# Patient Record
Sex: Female | Born: 1935 | Race: White | Hispanic: No | Smoking: Former smoker
Health system: Southern US, Community
[De-identification: ages and names within clinical notes are randomized; demographics above are authoritative.]

## PROBLEM LIST (undated history)

## (undated) DIAGNOSIS — F419 Anxiety disorder, unspecified: Secondary | ICD-10-CM

## (undated) DIAGNOSIS — E785 Hyperlipidemia, unspecified: Secondary | ICD-10-CM

## (undated) DIAGNOSIS — K922 Gastrointestinal hemorrhage, unspecified: Secondary | ICD-10-CM

## (undated) DIAGNOSIS — D473 Essential (hemorrhagic) thrombocythemia: Secondary | ICD-10-CM

## (undated) DIAGNOSIS — E872 Acidosis, unspecified: Secondary | ICD-10-CM

## (undated) DIAGNOSIS — I129 Hypertensive chronic kidney disease with stage 1 through stage 4 chronic kidney disease, or unspecified chronic kidney disease: Secondary | ICD-10-CM

## (undated) DIAGNOSIS — D369 Benign neoplasm, unspecified site: Secondary | ICD-10-CM

## (undated) DIAGNOSIS — E039 Hypothyroidism, unspecified: Secondary | ICD-10-CM

## (undated) DIAGNOSIS — J189 Pneumonia, unspecified organism: Secondary | ICD-10-CM

## (undated) DIAGNOSIS — F32A Depression, unspecified: Secondary | ICD-10-CM

## (undated) DIAGNOSIS — I739 Peripheral vascular disease, unspecified: Secondary | ICD-10-CM

## (undated) DIAGNOSIS — E1165 Type 2 diabetes mellitus with hyperglycemia: Secondary | ICD-10-CM

## (undated) DIAGNOSIS — F329 Major depressive disorder, single episode, unspecified: Secondary | ICD-10-CM

## (undated) DIAGNOSIS — Z8711 Personal history of peptic ulcer disease: Secondary | ICD-10-CM

## (undated) DIAGNOSIS — I4891 Unspecified atrial fibrillation: Secondary | ICD-10-CM

## (undated) DIAGNOSIS — I10 Essential (primary) hypertension: Secondary | ICD-10-CM

## (undated) DIAGNOSIS — K559 Vascular disorder of intestine, unspecified: Secondary | ICD-10-CM

## (undated) DIAGNOSIS — E1142 Type 2 diabetes mellitus with diabetic polyneuropathy: Secondary | ICD-10-CM

## (undated) DIAGNOSIS — J9691 Respiratory failure, unspecified with hypoxia: Secondary | ICD-10-CM

## (undated) DIAGNOSIS — D649 Anemia, unspecified: Secondary | ICD-10-CM

## (undated) DIAGNOSIS — R0902 Hypoxemia: Secondary | ICD-10-CM

## (undated) DIAGNOSIS — R609 Edema, unspecified: Secondary | ICD-10-CM

## (undated) DIAGNOSIS — I700 Atherosclerosis of aorta: Secondary | ICD-10-CM

## (undated) DIAGNOSIS — E8729 Other acidosis: Secondary | ICD-10-CM

## (undated) DIAGNOSIS — K5792 Diverticulitis of intestine, part unspecified, without perforation or abscess without bleeding: Secondary | ICD-10-CM

## (undated) DIAGNOSIS — M810 Age-related osteoporosis without current pathological fracture: Secondary | ICD-10-CM

## (undated) DIAGNOSIS — I509 Heart failure, unspecified: Secondary | ICD-10-CM

## (undated) DIAGNOSIS — J9692 Respiratory failure, unspecified with hypercapnia: Secondary | ICD-10-CM

## (undated) DIAGNOSIS — F1921 Other psychoactive substance dependence, in remission: Secondary | ICD-10-CM

## (undated) DIAGNOSIS — I480 Paroxysmal atrial fibrillation: Secondary | ICD-10-CM

## (undated) DIAGNOSIS — K2270 Barrett's esophagus without dysplasia: Secondary | ICD-10-CM

## (undated) DIAGNOSIS — R0989 Other specified symptoms and signs involving the circulatory and respiratory systems: Secondary | ICD-10-CM

## (undated) DIAGNOSIS — J449 Chronic obstructive pulmonary disease, unspecified: Secondary | ICD-10-CM

## (undated) DIAGNOSIS — K5790 Diverticulosis of intestine, part unspecified, without perforation or abscess without bleeding: Secondary | ICD-10-CM

## (undated) DIAGNOSIS — IMO0002 Reserved for concepts with insufficient information to code with codable children: Secondary | ICD-10-CM

## (undated) DIAGNOSIS — E1122 Type 2 diabetes mellitus with diabetic chronic kidney disease: Secondary | ICD-10-CM

## (undated) DIAGNOSIS — I340 Nonrheumatic mitral (valve) insufficiency: Secondary | ICD-10-CM

## (undated) DIAGNOSIS — F0390 Unspecified dementia without behavioral disturbance: Secondary | ICD-10-CM

## (undated) DIAGNOSIS — I447 Left bundle-branch block, unspecified: Secondary | ICD-10-CM

## (undated) DIAGNOSIS — J811 Chronic pulmonary edema: Secondary | ICD-10-CM

## (undated) DIAGNOSIS — D509 Iron deficiency anemia, unspecified: Secondary | ICD-10-CM

## (undated) DIAGNOSIS — F0150 Vascular dementia without behavioral disturbance: Secondary | ICD-10-CM

## (undated) DIAGNOSIS — M1990 Unspecified osteoarthritis, unspecified site: Secondary | ICD-10-CM

## (undated) DIAGNOSIS — K529 Noninfective gastroenteritis and colitis, unspecified: Secondary | ICD-10-CM

## (undated) HISTORY — PX: COLONOSCOPY: SHX174

## (undated) HISTORY — PX: SIGMOIDOSCOPY: SUR1295

## (undated) HISTORY — DX: Peripheral vascular disease, unspecified: I73.9

## (undated) HISTORY — DX: Noninfective gastroenteritis and colitis, unspecified: K52.9

## (undated) HISTORY — DX: Depression, unspecified: F32.A

## (undated) HISTORY — DX: Diverticulitis of intestine, part unspecified, without perforation or abscess without bleeding: K57.92

## (undated) HISTORY — PX: ABDOMINAL HYSTERECTOMY: SHX81

## (undated) HISTORY — DX: Hypothyroidism, unspecified: E03.9

## (undated) HISTORY — PX: SHOULDER ARTHROSCOPY: SHX128

## (undated) HISTORY — DX: Hyperlipidemia, unspecified: E78.5

## (undated) HISTORY — DX: Major depressive disorder, single episode, unspecified: F32.9

## (undated) HISTORY — DX: Anemia, unspecified: D64.9

## (undated) HISTORY — PX: ESOPHAGOGASTRODUODENOSCOPY: SHX1529

## (undated) HISTORY — PX: APPENDECTOMY: SHX54

## (undated) HISTORY — DX: Essential (primary) hypertension: I10

## (undated) SURGERY — Surgical Case
Anesthesia: *Unknown

---

## 2004-04-19 ENCOUNTER — Ambulatory Visit: Payer: Self-pay | Admitting: Internal Medicine

## 2004-05-20 ENCOUNTER — Ambulatory Visit: Payer: Self-pay | Admitting: Internal Medicine

## 2004-10-02 ENCOUNTER — Inpatient Hospital Stay: Payer: Self-pay | Admitting: Internal Medicine

## 2005-10-28 ENCOUNTER — Ambulatory Visit: Payer: Self-pay | Admitting: Unknown Physician Specialty

## 2006-03-03 ENCOUNTER — Ambulatory Visit: Payer: Self-pay | Admitting: Unknown Physician Specialty

## 2007-12-14 ENCOUNTER — Ambulatory Visit: Payer: Self-pay | Admitting: Internal Medicine

## 2008-04-16 ENCOUNTER — Ambulatory Visit: Payer: Self-pay | Admitting: Unknown Physician Specialty

## 2009-05-13 ENCOUNTER — Emergency Department: Payer: Self-pay | Admitting: Emergency Medicine

## 2011-03-22 ENCOUNTER — Inpatient Hospital Stay: Payer: Self-pay | Admitting: Internal Medicine

## 2011-03-22 DIAGNOSIS — I059 Rheumatic mitral valve disease, unspecified: Secondary | ICD-10-CM

## 2011-08-24 ENCOUNTER — Ambulatory Visit: Payer: Self-pay | Admitting: Internal Medicine

## 2011-08-24 LAB — CREATININE, SERUM
Creatinine: 0.69 mg/dL (ref 0.60–1.30)
EGFR (African American): >60
EGFR (Non-African Amer.): >60

## 2011-10-21 ENCOUNTER — Inpatient Hospital Stay: Payer: Self-pay | Admitting: Internal Medicine

## 2011-10-21 LAB — URINALYSIS, COMPLETE
Bacteria: NONE SEEN
Bilirubin,UR: NEGATIVE
Nitrite: NEGATIVE
Protein: NEGATIVE
Specific Gravity: 1.032 (ref 1.003–1.030)
WBC UR: 9 /HPF (ref 0–5)

## 2011-10-21 LAB — COMPREHENSIVE METABOLIC PANEL
Albumin: 4.1 g/dL (ref 3.4–5.0)
Alkaline Phosphatase: 76 U/L (ref 50–136)
Anion Gap: 13 (ref 7–16)
BUN: 15 mg/dL (ref 7–18)
Bilirubin,Total: 0.4 mg/dL (ref 0.2–1.0)
Calcium, Total: 8.6 mg/dL (ref 8.5–10.1)
EGFR (African American): >60
EGFR (Non-African Amer.): >60
Osmolality: 272 (ref 275–301)
Potassium: 4.1 mmol/L (ref 3.5–5.1)
SGPT (ALT): 35 U/L
Total Protein: 7.7 g/dL (ref 6.4–8.2)

## 2011-10-21 LAB — CBC
HCT: 37.8 % (ref 35.0–47.0)
HGB: 12.1 g/dL (ref 12.0–16.0)
MCV: 81 fL (ref 80–100)
Platelet: 384 10*3/uL (ref 150–440)
RDW: 17.4 % — ABNORMAL HIGH (ref 11.5–14.5)

## 2011-10-22 LAB — COMPREHENSIVE METABOLIC PANEL
Albumin: 3.2 g/dL — ABNORMAL LOW (ref 3.4–5.0)
Alkaline Phosphatase: 59 U/L (ref 50–136)
Anion Gap: 9 (ref 7–16)
BUN: 8 mg/dL (ref 7–18)
Bilirubin,Total: 0.3 mg/dL (ref 0.2–1.0)
Calcium, Total: 8.3 mg/dL — ABNORMAL LOW (ref 8.5–10.1)
Co2: 29 mmol/L (ref 21–32)
Creatinine: 0.72 mg/dL (ref 0.60–1.30)
EGFR (African American): >60
EGFR (Non-African Amer.): >60
Glucose: 98 mg/dL (ref 65–99)
Osmolality: 283 (ref 275–301)
Potassium: 3.8 mmol/L (ref 3.5–5.1)
SGPT (ALT): 27 U/L
Sodium: 143 mmol/L (ref 136–145)
Total Protein: 6.5 g/dL (ref 6.4–8.2)

## 2011-10-22 LAB — CBC WITH DIFFERENTIAL/PLATELET
Basophil #: 0 10*3/uL (ref 0.0–0.1)
HCT: 33.5 % — ABNORMAL LOW (ref 35.0–47.0)
Lymphocyte #: 2.3 10*3/uL (ref 1.0–3.6)
MCHC: 32.2 g/dL (ref 32.0–36.0)
MCV: 80 fL (ref 80–100)
Monocyte #: 1.5 10*3/uL — ABNORMAL HIGH (ref 0.0–0.7)
Monocyte %: 14.4 %
Neutrophil #: 6.3 10*3/uL (ref 1.4–6.5)
Neutrophil %: 61.8 %
RBC: 4.19 10*6/uL (ref 3.80–5.20)
WBC: 10.2 10*3/uL (ref 3.6–11.0)

## 2011-10-22 LAB — PROTIME-INR: Prothrombin Time: 13.2 secs (ref 11.5–14.7)

## 2011-10-22 LAB — HEMOGLOBIN: HGB: 11.2 g/dL — ABNORMAL LOW (ref 12.0–16.0)

## 2011-10-23 LAB — BASIC METABOLIC PANEL
Anion Gap: 9 (ref 7–16)
Calcium, Total: 8.1 mg/dL — ABNORMAL LOW (ref 8.5–10.1)
Creatinine: 0.6 mg/dL (ref 0.60–1.30)
EGFR (African American): >60
Osmolality: 284 (ref 275–301)
Potassium: 3.7 mmol/L (ref 3.5–5.1)
Sodium: 144 mmol/L (ref 136–145)

## 2011-10-23 LAB — CBC WITH DIFFERENTIAL/PLATELET
Basophil #: 0 10*3/uL (ref 0.0–0.1)
Eosinophil #: 0.1 10*3/uL (ref 0.0–0.7)
HCT: 32.5 % — ABNORMAL LOW (ref 35.0–47.0)
MCH: 25.6 pg — ABNORMAL LOW (ref 26.0–34.0)
Monocyte #: 1.2 10*3/uL — ABNORMAL HIGH (ref 0.0–0.7)
Monocyte %: 15.4 %
Neutrophil #: 4.3 10*3/uL (ref 1.4–6.5)
RBC: 4.04 10*6/uL (ref 3.80–5.20)
RDW: 17.5 % — ABNORMAL HIGH (ref 11.5–14.5)

## 2011-10-24 LAB — BASIC METABOLIC PANEL
Calcium, Total: 8.1 mg/dL — ABNORMAL LOW (ref 8.5–10.1)
EGFR (African American): >60
EGFR (Non-African Amer.): >60
Glucose: 98 mg/dL (ref 65–99)
Osmolality: 281 (ref 275–301)
Potassium: 3.5 mmol/L (ref 3.5–5.1)
Sodium: 143 mmol/L (ref 136–145)

## 2011-10-25 LAB — HEMOGLOBIN: HGB: 10.4 g/dL — ABNORMAL LOW (ref 12.0–16.0)

## 2012-08-29 ENCOUNTER — Inpatient Hospital Stay: Payer: Self-pay | Admitting: Specialist

## 2012-08-29 LAB — URINALYSIS, COMPLETE
Bilirubin,UR: NEGATIVE
Glucose,UR: NEGATIVE mg/dL (ref 0–75)
Ketone: NEGATIVE
Nitrite: NEGATIVE
Ph: 7 (ref 4.5–8.0)
Protein: NEGATIVE
Specific Gravity: 1.005 (ref 1.003–1.030)
WBC UR: 3 /HPF (ref 0–5)

## 2012-08-29 LAB — COMPREHENSIVE METABOLIC PANEL
Alkaline Phosphatase: 85 U/L (ref 50–136)
Anion Gap: 6 — ABNORMAL LOW (ref 7–16)
Bilirubin,Total: 0.3 mg/dL (ref 0.2–1.0)
Calcium, Total: 8.2 mg/dL — ABNORMAL LOW (ref 8.5–10.1)
Chloride: 105 mmol/L (ref 98–107)
Co2: 27 mmol/L (ref 21–32)
Creatinine: 0.57 mg/dL — ABNORMAL LOW (ref 0.60–1.30)
EGFR (Non-African Amer.): >60
Glucose: 96 mg/dL (ref 65–99)
Potassium: 4 mmol/L (ref 3.5–5.1)
SGOT(AST): 34 U/L (ref 15–37)
SGPT (ALT): 27 U/L (ref 12–78)
Sodium: 138 mmol/L (ref 136–145)

## 2012-08-29 LAB — IRON AND TIBC
Iron Bind.Cap.(Total): 537 ug/dL — ABNORMAL HIGH (ref 250–450)
Unbound Iron-Bind.Cap.: 518 ug/dL

## 2012-08-29 LAB — CBC
HGB: 5.4 g/dL — ABNORMAL LOW (ref 12.0–16.0)
MCH: 17.3 pg — ABNORMAL LOW (ref 26.0–34.0)
MCHC: 28.3 g/dL — ABNORMAL LOW (ref 32.0–36.0)
MCV: 61 fL — ABNORMAL LOW (ref 80–100)
RBC: 3.11 10*6/uL — ABNORMAL LOW (ref 3.80–5.20)
RDW: 20 % — ABNORMAL HIGH (ref 11.5–14.5)

## 2012-08-29 LAB — TROPONIN I: Troponin-I: <0.02 ng/mL

## 2012-08-29 LAB — HEMOGLOBIN: HGB: 7.9 g/dL — ABNORMAL LOW (ref 12.0–16.0)

## 2012-08-30 LAB — BASIC METABOLIC PANEL
Anion Gap: 8 (ref 7–16)
Chloride: 103 mmol/L (ref 98–107)
Co2: 26 mmol/L (ref 21–32)
EGFR (African American): >60
EGFR (Non-African Amer.): >60
Glucose: 164 mg/dL — ABNORMAL HIGH (ref 65–99)
Osmolality: 278 (ref 275–301)
Potassium: 3.5 mmol/L (ref 3.5–5.1)

## 2012-08-30 LAB — CBC WITH DIFFERENTIAL/PLATELET
Comment - H1-Com4: NORMAL
HCT: 24.5 % — ABNORMAL LOW (ref 35.0–47.0)
HGB: 7.3 g/dL — ABNORMAL LOW (ref 12.0–16.0)
MCH: 19.4 pg — ABNORMAL LOW (ref 26.0–34.0)
MCHC: 29.9 g/dL — ABNORMAL LOW (ref 32.0–36.0)
Monocytes: 1 %
Platelet: 446 10*3/uL — ABNORMAL HIGH (ref 150–440)
RBC: 3.77 10*6/uL — ABNORMAL LOW (ref 3.80–5.20)
RDW: 24.9 % — ABNORMAL HIGH (ref 11.5–14.5)
Segmented Neutrophils: 95 %
WBC: 13.4 10*3/uL — ABNORMAL HIGH (ref 3.6–11.0)

## 2012-08-30 LAB — HEMOGLOBIN: HGB: 7.8 g/dL — ABNORMAL LOW (ref 12.0–16.0)

## 2012-08-31 DIAGNOSIS — I517 Cardiomegaly: Secondary | ICD-10-CM

## 2012-08-31 LAB — BASIC METABOLIC PANEL
Anion Gap: 10 (ref 7–16)
BUN: 13 mg/dL (ref 7–18)
Calcium, Total: 7.7 mg/dL — ABNORMAL LOW (ref 8.5–10.1)
Chloride: 105 mmol/L (ref 98–107)
Co2: 24 mmol/L (ref 21–32)
Creatinine: 0.68 mg/dL (ref 0.60–1.30)
EGFR (African American): >60
EGFR (Non-African Amer.): >60
Glucose: 136 mg/dL — ABNORMAL HIGH (ref 65–99)
Osmolality: 280 (ref 275–301)
Potassium: 3.5 mmol/L (ref 3.5–5.1)
Sodium: 139 mmol/L (ref 136–145)

## 2012-08-31 LAB — CBC WITH DIFFERENTIAL/PLATELET
Basophil: 1 %
Comment - H1-Com6: NORMAL
HCT: 23.4 % — ABNORMAL LOW (ref 35.0–47.0)
HGB: 6.9 g/dL — ABNORMAL LOW (ref 12.0–16.0)
Lymphocytes: 6 %
MCH: 19 pg — ABNORMAL LOW (ref 26.0–34.0)
MCHC: 29.4 g/dL — ABNORMAL LOW (ref 32.0–36.0)
Monocytes: 6 %
RBC: 3.62 10*6/uL — ABNORMAL LOW (ref 3.80–5.20)
RDW: 25.2 % — ABNORMAL HIGH (ref 11.5–14.5)
Segmented Neutrophils: 87 %
WBC: 14.4 10*3/uL — ABNORMAL HIGH (ref 3.6–11.0)

## 2012-09-01 LAB — HEMOGLOBIN: HGB: 8.5 g/dL — ABNORMAL LOW (ref 12.0–16.0)

## 2012-09-01 LAB — HEMATOCRIT: HCT: 28.9 % — ABNORMAL LOW (ref 35.0–47.0)

## 2012-09-02 LAB — CBC WITH DIFFERENTIAL/PLATELET
Basophil #: 0.1 10*3/uL (ref 0.0–0.1)
Basophil %: 0.5 %
Eosinophil #: 0.1 10*3/uL (ref 0.0–0.7)
HGB: 8.4 g/dL — ABNORMAL LOW (ref 12.0–16.0)
Lymphocyte %: 7.4 %
MCH: 19.7 pg — ABNORMAL LOW (ref 26.0–34.0)
MCHC: 28.9 g/dL — ABNORMAL LOW (ref 32.0–36.0)
MCV: 68 fL — ABNORMAL LOW (ref 80–100)
Monocyte %: 10.9 %
Neutrophil #: 12.6 10*3/uL — ABNORMAL HIGH (ref 1.4–6.5)
Platelet: 410 10*3/uL (ref 150–440)
RBC: 4.24 10*6/uL (ref 3.80–5.20)
WBC: 15.7 10*3/uL — ABNORMAL HIGH (ref 3.6–11.0)

## 2012-09-03 LAB — CBC WITH DIFFERENTIAL/PLATELET
Basophil #: 0.1 10*3/uL (ref 0.0–0.1)
Basophil %: 0.7 %
Eosinophil #: 0.2 10*3/uL (ref 0.0–0.7)
HGB: 8.2 g/dL — ABNORMAL LOW (ref 12.0–16.0)
Lymphocyte #: 2 10*3/uL (ref 1.0–3.6)
Lymphocyte %: 19.2 %
MCH: 19.2 pg — ABNORMAL LOW (ref 26.0–34.0)
MCHC: 28.2 g/dL — ABNORMAL LOW (ref 32.0–36.0)
MCV: 68 fL — ABNORMAL LOW (ref 80–100)
Monocyte #: 1.5 x10 3/mm — ABNORMAL HIGH (ref 0.2–0.9)
Monocyte %: 14.2 %
Platelet: 416 10*3/uL (ref 150–440)
RBC: 4.25 10*6/uL (ref 3.80–5.20)
RDW: 28.6 % — ABNORMAL HIGH (ref 11.5–14.5)
WBC: 10.5 10*3/uL (ref 3.6–11.0)

## 2012-09-04 LAB — CULTURE, BLOOD (SINGLE)

## 2012-09-05 ENCOUNTER — Encounter: Payer: Self-pay | Admitting: Internal Medicine

## 2012-09-05 LAB — HEMOGLOBIN: HGB: 8.2 g/dL — ABNORMAL LOW (ref 12.0–16.0)

## 2012-09-13 LAB — CBC WITH DIFFERENTIAL/PLATELET
Basophil %: 1.2 %
Eosinophil #: 0.1 10*3/uL (ref 0.0–0.7)
Eosinophil %: 1.4 %
HCT: 28.1 % — ABNORMAL LOW (ref 35.0–47.0)
HGB: 8.2 g/dL — ABNORMAL LOW (ref 12.0–16.0)
Lymphocyte #: 1.4 10*3/uL (ref 1.0–3.6)
MCH: 20 pg — ABNORMAL LOW (ref 26.0–34.0)
Monocyte #: 0.7 x10 3/mm (ref 0.2–0.9)
Monocyte %: 13.5 %
Neutrophil #: 3.1 10*3/uL (ref 1.4–6.5)
Platelet: 644 10*3/uL — ABNORMAL HIGH (ref 150–440)
RBC: 4.12 10*6/uL (ref 3.80–5.20)
RDW: 27.7 % — ABNORMAL HIGH (ref 11.5–14.5)
WBC: 5.4 10*3/uL (ref 3.6–11.0)

## 2012-09-17 ENCOUNTER — Encounter: Payer: Self-pay | Admitting: Internal Medicine

## 2012-10-03 ENCOUNTER — Ambulatory Visit: Payer: Self-pay | Admitting: Unknown Physician Specialty

## 2012-11-17 ENCOUNTER — Ambulatory Visit: Payer: Self-pay | Admitting: Oncology

## 2012-11-29 ENCOUNTER — Inpatient Hospital Stay: Payer: Self-pay | Admitting: Internal Medicine

## 2012-11-29 LAB — CBC
HGB: 6.7 g/dL — ABNORMAL LOW (ref 12.0–16.0)
MCHC: 28 g/dL — ABNORMAL LOW (ref 32.0–36.0)
MCV: 64 fL — ABNORMAL LOW (ref 80–100)
RBC: 3.73 10*6/uL — ABNORMAL LOW (ref 3.80–5.20)
RDW: 20.5 % — ABNORMAL HIGH (ref 11.5–14.5)
WBC: 27.5 10*3/uL — ABNORMAL HIGH (ref 3.6–11.0)

## 2012-11-29 LAB — COMPREHENSIVE METABOLIC PANEL
Albumin: 3.3 g/dL — ABNORMAL LOW (ref 3.4–5.0)
Anion Gap: 8 (ref 7–16)
BUN: 8 mg/dL (ref 7–18)
Bilirubin,Total: 0.4 mg/dL (ref 0.2–1.0)
Calcium, Total: 8 mg/dL — ABNORMAL LOW (ref 8.5–10.1)
Chloride: 98 mmol/L (ref 98–107)
Co2: 27 mmol/L (ref 21–32)
EGFR (African American): >60
EGFR (Non-African Amer.): >60
Glucose: 233 mg/dL — ABNORMAL HIGH (ref 65–99)
Osmolality: 272 (ref 275–301)
Potassium: 3.5 mmol/L (ref 3.5–5.1)
SGOT(AST): 25 U/L (ref 15–37)
SGPT (ALT): 28 U/L (ref 12–78)

## 2012-11-29 LAB — URINALYSIS, COMPLETE
Bacteria: NONE SEEN
Hyaline Cast: 11
Ketone: NEGATIVE
Nitrite: NEGATIVE
Ph: 6 (ref 4.5–8.0)

## 2012-11-29 LAB — CK TOTAL AND CKMB (NOT AT ARMC): CK-MB: 0.6 ng/mL (ref 0.5–3.6)

## 2012-11-29 LAB — PRO B NATRIURETIC PEPTIDE: B-Type Natriuretic Peptide: 1663 pg/mL — ABNORMAL HIGH (ref 0–450)

## 2012-11-30 LAB — BASIC METABOLIC PANEL
Anion Gap: 7 (ref 7–16)
BUN: 6 mg/dL — ABNORMAL LOW (ref 7–18)
Calcium, Total: 7.4 mg/dL — ABNORMAL LOW (ref 8.5–10.1)
Chloride: 103 mmol/L (ref 98–107)
Co2: 24 mmol/L (ref 21–32)
Creatinine: 0.65 mg/dL (ref 0.60–1.30)
Glucose: 190 mg/dL — ABNORMAL HIGH (ref 65–99)
Osmolality: 271 (ref 275–301)
Potassium: 2.9 mmol/L — ABNORMAL LOW (ref 3.5–5.1)
Sodium: 134 mmol/L — ABNORMAL LOW (ref 136–145)

## 2012-11-30 LAB — CBC WITH DIFFERENTIAL/PLATELET
Basophil #: 0 10*3/uL (ref 0.0–0.1)
Basophil %: 0 %
Eosinophil #: 0 10*3/uL (ref 0.0–0.7)
Eosinophil %: 0 %
HCT: 21.4 % — ABNORMAL LOW (ref 35.0–47.0)
HGB: 6.4 g/dL — ABNORMAL LOW (ref 12.0–16.0)
Lymphocyte #: 0.4 10*3/uL — ABNORMAL LOW (ref 1.0–3.6)
Lymphocyte %: 2.6 %
MCH: 19.6 pg — ABNORMAL LOW (ref 26.0–34.0)
MCHC: 29.9 g/dL — ABNORMAL LOW (ref 32.0–36.0)
MCV: 66 fL — ABNORMAL LOW (ref 80–100)
Monocyte #: 0.6 x10 3/mm (ref 0.2–0.9)
Monocyte %: 3.8 %
Neutrophil #: 15.6 10*3/uL — ABNORMAL HIGH (ref 1.4–6.5)
Neutrophil %: 93.6 %
Platelet: 304 10*3/uL (ref 150–440)
RBC: 3.26 10*6/uL — ABNORMAL LOW (ref 3.80–5.20)
RDW: 22.7 % — ABNORMAL HIGH (ref 11.5–14.5)
WBC: 16.7 10*3/uL — ABNORMAL HIGH (ref 3.6–11.0)

## 2012-12-01 LAB — BASIC METABOLIC PANEL
Anion Gap: 5 — ABNORMAL LOW (ref 7–16)
BUN: 11 mg/dL (ref 7–18)
Calcium, Total: 7.7 mg/dL — ABNORMAL LOW (ref 8.5–10.1)
Chloride: 104 mmol/L (ref 98–107)
Co2: 25 mmol/L (ref 21–32)
Creatinine: 0.5 mg/dL — ABNORMAL LOW (ref 0.60–1.30)
EGFR (African American): >60
EGFR (Non-African Amer.): >60
Glucose: 172 mg/dL — ABNORMAL HIGH (ref 65–99)
Osmolality: 272 (ref 275–301)

## 2012-12-01 LAB — CBC WITH DIFFERENTIAL/PLATELET
Eosinophil #: 0 10*3/uL (ref 0.0–0.7)
HCT: 24.1 % — ABNORMAL LOW (ref 35.0–47.0)
HGB: 7.2 g/dL — ABNORMAL LOW (ref 12.0–16.0)
Lymphocyte #: 0.4 10*3/uL — ABNORMAL LOW (ref 1.0–3.6)
MCH: 20.5 pg — ABNORMAL LOW (ref 26.0–34.0)
MCV: 68 fL — ABNORMAL LOW (ref 80–100)
Neutrophil #: 25.5 10*3/uL — ABNORMAL HIGH (ref 1.4–6.5)
WBC: 27.1 10*3/uL — ABNORMAL HIGH (ref 3.6–11.0)

## 2012-12-01 LAB — EXPECTORATED SPUTUM ASSESSMENT W REFEX TO RESP CULTURE

## 2012-12-01 LAB — MAGNESIUM: Magnesium: 2.2 mg/dL

## 2012-12-02 LAB — CBC WITH DIFFERENTIAL/PLATELET
Eosinophil #: 0 10*3/uL (ref 0.0–0.7)
Eosinophil %: 0 %
HGB: 8.6 g/dL — ABNORMAL LOW (ref 12.0–16.0)
Lymphocyte #: 0.4 10*3/uL — ABNORMAL LOW (ref 1.0–3.6)
Lymphocyte %: 1.4 %
MCH: 20.4 pg — ABNORMAL LOW (ref 26.0–34.0)
MCHC: 29.8 g/dL — ABNORMAL LOW (ref 32.0–36.0)
Monocyte #: 1.7 x10 3/mm — ABNORMAL HIGH (ref 0.2–0.9)
Monocyte %: 5.7 %
Neutrophil #: 27.4 10*3/uL — ABNORMAL HIGH (ref 1.4–6.5)
RBC: 4.23 10*6/uL (ref 3.80–5.20)
RDW: 24.9 % — ABNORMAL HIGH (ref 11.5–14.5)
WBC: 29.6 10*3/uL — ABNORMAL HIGH (ref 3.6–11.0)

## 2012-12-03 LAB — CBC WITH DIFFERENTIAL/PLATELET
Basophil #: 0 10*3/uL (ref 0.0–0.1)
Basophil %: 0.3 %
Eosinophil #: 0 10*3/uL (ref 0.0–0.7)
Eosinophil %: 0.1 %
HCT: 26.3 % — ABNORMAL LOW (ref 35.0–47.0)
HGB: 7.9 g/dL — ABNORMAL LOW (ref 12.0–16.0)
Lymphocyte #: 1.9 10*3/uL (ref 1.0–3.6)
Lymphocyte %: 10.5 %
MCH: 20.5 pg — ABNORMAL LOW (ref 26.0–34.0)
MCHC: 29.9 g/dL — ABNORMAL LOW (ref 32.0–36.0)
MCV: 68 fL — ABNORMAL LOW (ref 80–100)
Monocyte #: 1.9 x10 3/mm — ABNORMAL HIGH (ref 0.2–0.9)
Monocyte %: 10.5 %
Neutrophil #: 14.5 10*3/uL — ABNORMAL HIGH (ref 1.4–6.5)
Neutrophil %: 78.6 %
Platelet: 392 10*3/uL (ref 150–440)
RBC: 3.84 10*6/uL (ref 3.80–5.20)
RDW: 25.9 % — ABNORMAL HIGH (ref 11.5–14.5)
WBC: 18.4 10*3/uL — ABNORMAL HIGH (ref 3.6–11.0)

## 2012-12-05 LAB — CREATININE, SERUM
Creatinine: 0.39 mg/dL — ABNORMAL LOW (ref 0.60–1.30)
EGFR (African American): >60
EGFR (Non-African Amer.): >60

## 2012-12-05 LAB — CULTURE, BLOOD (SINGLE)

## 2012-12-18 ENCOUNTER — Ambulatory Visit: Payer: Self-pay | Admitting: Oncology

## 2014-01-22 ENCOUNTER — Inpatient Hospital Stay: Payer: Self-pay | Admitting: Student

## 2014-01-22 LAB — CBC WITH DIFFERENTIAL/PLATELET
BASOS ABS: 0.1 10*3/uL (ref 0.0–0.1)
Basophil %: 0.6 %
EOS ABS: 0.6 10*3/uL (ref 0.0–0.7)
Eosinophil %: 3.1 %
HCT: 23.7 % — ABNORMAL LOW (ref 35.0–47.0)
HGB: 6.5 g/dL — AB (ref 12.0–16.0)
Lymphocyte #: 9.5 10*3/uL — ABNORMAL HIGH (ref 1.0–3.6)
Lymphocyte %: 46.6 %
MCH: 20.1 pg — AB (ref 26.0–34.0)
MCHC: 27.4 g/dL — AB (ref 32.0–36.0)
MCV: 73 fL — ABNORMAL LOW (ref 80–100)
MONO ABS: 2.7 x10 3/mm — AB (ref 0.2–0.9)
MONOS PCT: 13.2 %
NEUTROS ABS: 7.4 10*3/uL — AB (ref 1.4–6.5)
NEUTROS PCT: 36.5 %
Platelet: 614 10*3/uL — ABNORMAL HIGH (ref 150–440)
RBC: 3.24 10*6/uL — ABNORMAL LOW (ref 3.80–5.20)
RDW: 18.8 % — AB (ref 11.5–14.5)
WBC: 20.3 10*3/uL — ABNORMAL HIGH (ref 3.6–11.0)

## 2014-01-22 LAB — COMPREHENSIVE METABOLIC PANEL
ALT: 41 U/L (ref 12–78)
Albumin: 2.9 g/dL — ABNORMAL LOW (ref 3.4–5.0)
Alkaline Phosphatase: 88 U/L
Anion Gap: 13 (ref 7–16)
BUN: 8 mg/dL (ref 7–18)
Bilirubin,Total: 0.2 mg/dL (ref 0.2–1.0)
CALCIUM: 7.7 mg/dL — AB (ref 8.5–10.1)
CO2: 22 mmol/L (ref 21–32)
Chloride: 98 mmol/L (ref 98–107)
Creatinine: 0.86 mg/dL (ref 0.60–1.30)
EGFR (Non-African Amer.): >60
Glucose: 332 mg/dL — ABNORMAL HIGH (ref 65–99)
Osmolality: 278 (ref 275–301)
Potassium: 4.1 mmol/L (ref 3.5–5.1)
SGOT(AST): 41 U/L — ABNORMAL HIGH (ref 15–37)
Sodium: 133 mmol/L — ABNORMAL LOW (ref 136–145)
Total Protein: 7.3 g/dL (ref 6.4–8.2)

## 2014-01-22 LAB — TROPONIN I
TROPONIN-I: 0.04 ng/mL
TROPONIN-I: 0.05 ng/mL

## 2014-01-22 LAB — URINALYSIS, COMPLETE
BACTERIA: NONE SEEN
BLOOD: NEGATIVE
Bilirubin,UR: NEGATIVE
Glucose,UR: >=500 mg/dL (ref 0–75)
Leukocyte Esterase: NEGATIVE
Nitrite: NEGATIVE
PH: 5 (ref 4.5–8.0)
PROTEIN: NEGATIVE
RBC,UR: 6 /HPF (ref 0–5)
Specific Gravity: 1.02 (ref 1.003–1.030)
WBC UR: 3 /HPF (ref 0–5)

## 2014-01-22 LAB — OCCULT BLOOD X 1 CARD TO LAB, STOOL: Occult Blood, Feces: POSITIVE

## 2014-01-22 LAB — HEMOGLOBIN
HGB: 6.8 g/dL — ABNORMAL LOW (ref 12.0–16.0)
HGB: 8.1 g/dL — AB (ref 12.0–16.0)

## 2014-01-22 LAB — CK-MB
CK-MB: 1.7 ng/mL (ref 0.5–3.6)
CK-MB: 2.1 ng/mL (ref 0.5–3.6)

## 2014-01-22 LAB — PRO B NATRIURETIC PEPTIDE: B-TYPE NATIURETIC PEPTID: 1481 pg/mL — AB (ref 0–450)

## 2014-01-22 LAB — MAGNESIUM: MAGNESIUM: 2 mg/dL

## 2014-01-22 LAB — PROTIME-INR
INR: 1.1
Prothrombin Time: 14 secs (ref 11.5–14.7)

## 2014-01-22 LAB — PHOSPHORUS: Phosphorus: 5.5 mg/dL — ABNORMAL HIGH (ref 2.5–4.9)

## 2014-01-22 LAB — CK: CK, Total: 62 U/L

## 2014-01-23 LAB — BASIC METABOLIC PANEL
Anion Gap: 9 (ref 7–16)
BUN: 11 mg/dL (ref 7–18)
CO2: 26 mmol/L (ref 21–32)
CREATININE: 0.74 mg/dL (ref 0.60–1.30)
Calcium, Total: 7.6 mg/dL — ABNORMAL LOW (ref 8.5–10.1)
Chloride: 99 mmol/L (ref 98–107)
EGFR (African American): >60
GLUCOSE: 233 mg/dL — AB (ref 65–99)
OSMOLALITY: 275 (ref 275–301)
Potassium: 3.7 mmol/L (ref 3.5–5.1)
Sodium: 134 mmol/L — ABNORMAL LOW (ref 136–145)

## 2014-01-23 LAB — CBC WITH DIFFERENTIAL/PLATELET
BASOS ABS: 0 10*3/uL (ref 0.0–0.1)
Basophil %: 0.2 %
EOS PCT: 0 %
Eosinophil #: 0 10*3/uL (ref 0.0–0.7)
HCT: 24.7 % — ABNORMAL LOW (ref 35.0–47.0)
HGB: 7.7 g/dL — AB (ref 12.0–16.0)
LYMPHS ABS: 1 10*3/uL (ref 1.0–3.6)
LYMPHS PCT: 7.2 %
MCH: 22.6 pg — ABNORMAL LOW (ref 26.0–34.0)
MCHC: 31.1 g/dL — AB (ref 32.0–36.0)
MCV: 73 fL — ABNORMAL LOW (ref 80–100)
Monocyte #: 1.5 x10 3/mm — ABNORMAL HIGH (ref 0.2–0.9)
Monocyte %: 10.4 %
NEUTROS PCT: 82.2 %
Neutrophil #: 11.9 10*3/uL — ABNORMAL HIGH (ref 1.4–6.5)
Platelet: 372 10*3/uL (ref 150–440)
RBC: 3.4 10*6/uL — ABNORMAL LOW (ref 3.80–5.20)
RDW: 19.6 % — ABNORMAL HIGH (ref 11.5–14.5)
WBC: 14.5 10*3/uL — AB (ref 3.6–11.0)

## 2014-01-23 LAB — URINE CULTURE

## 2014-01-23 LAB — HEMOGLOBIN: HGB: 8 g/dL — AB (ref 12.0–16.0)

## 2014-01-24 LAB — CBC WITH DIFFERENTIAL/PLATELET
BASOS ABS: 0 10*3/uL (ref 0.0–0.1)
Basophil %: 0.1 %
EOS PCT: 0 %
Eosinophil #: 0 10*3/uL (ref 0.0–0.7)
HCT: 26.2 % — AB (ref 35.0–47.0)
HGB: 7.8 g/dL — ABNORMAL LOW (ref 12.0–16.0)
Lymphocyte #: 0.8 10*3/uL — ABNORMAL LOW (ref 1.0–3.6)
Lymphocyte %: 5 %
MCH: 21.9 pg — ABNORMAL LOW (ref 26.0–34.0)
MCHC: 29.8 g/dL — ABNORMAL LOW (ref 32.0–36.0)
MCV: 74 fL — ABNORMAL LOW (ref 80–100)
MONO ABS: 1.6 x10 3/mm — AB (ref 0.2–0.9)
MONOS PCT: 9.3 %
NEUTROS ABS: 14.4 10*3/uL — AB (ref 1.4–6.5)
Neutrophil %: 85.6 %
Platelet: 386 10*3/uL (ref 150–440)
RBC: 3.55 10*6/uL — ABNORMAL LOW (ref 3.80–5.20)
RDW: 20.3 % — AB (ref 11.5–14.5)
WBC: 16.8 10*3/uL — AB (ref 3.6–11.0)

## 2014-01-25 LAB — BASIC METABOLIC PANEL
Anion Gap: 10 (ref 7–16)
BUN: 17 mg/dL (ref 7–18)
Calcium, Total: 8 mg/dL — ABNORMAL LOW (ref 8.5–10.1)
Chloride: 95 mmol/L — ABNORMAL LOW (ref 98–107)
Co2: 31 mmol/L (ref 21–32)
Creatinine: 0.72 mg/dL (ref 0.60–1.30)
EGFR (African American): >60
EGFR (Non-African Amer.): >60
Glucose: 243 mg/dL — ABNORMAL HIGH (ref 65–99)
Osmolality: 282 (ref 275–301)
Potassium: 4 mmol/L (ref 3.5–5.1)
Sodium: 136 mmol/L (ref 136–145)

## 2014-01-25 LAB — CBC WITH DIFFERENTIAL/PLATELET
BASOS ABS: 0 10*3/uL (ref 0.0–0.1)
Basophil %: 0.1 %
Eosinophil #: 0 10*3/uL (ref 0.0–0.7)
Eosinophil %: 0 %
HCT: 25.3 % — AB (ref 35.0–47.0)
HGB: 7.7 g/dL — AB (ref 12.0–16.0)
Lymphocyte #: 0.9 10*3/uL — ABNORMAL LOW (ref 1.0–3.6)
Lymphocyte %: 6.6 %
MCH: 22.4 pg — AB (ref 26.0–34.0)
MCHC: 30.6 g/dL — ABNORMAL LOW (ref 32.0–36.0)
MCV: 73 fL — AB (ref 80–100)
MONOS PCT: 9.6 %
Monocyte #: 1.3 x10 3/mm — ABNORMAL HIGH (ref 0.2–0.9)
Neutrophil #: 11.4 10*3/uL — ABNORMAL HIGH (ref 1.4–6.5)
Neutrophil %: 83.7 %
Platelet: 397 10*3/uL (ref 150–440)
RBC: 3.46 10*6/uL — AB (ref 3.80–5.20)
RDW: 20.4 % — AB (ref 11.5–14.5)
WBC: 13.6 10*3/uL — ABNORMAL HIGH (ref 3.6–11.0)

## 2014-01-25 LAB — VANCOMYCIN, TROUGH: VANCOMYCIN, TROUGH: 4 ug/mL — AB (ref 10–20)

## 2014-01-26 LAB — CBC WITH DIFFERENTIAL/PLATELET
Basophil #: 0 10*3/uL (ref 0.0–0.1)
Basophil %: 0.2 %
Eosinophil #: 0 10*3/uL (ref 0.0–0.7)
Eosinophil %: 0.1 %
HCT: 28.7 % — AB (ref 35.0–47.0)
HGB: 8.8 g/dL — ABNORMAL LOW (ref 12.0–16.0)
LYMPHS ABS: 0.7 10*3/uL — AB (ref 1.0–3.6)
Lymphocyte %: 6.7 %
MCH: 23.3 pg — ABNORMAL LOW (ref 26.0–34.0)
MCHC: 30.8 g/dL — AB (ref 32.0–36.0)
MCV: 76 fL — AB (ref 80–100)
MONO ABS: 0.9 x10 3/mm (ref 0.2–0.9)
Monocyte %: 8.8 %
Neutrophil #: 8.4 10*3/uL — ABNORMAL HIGH (ref 1.4–6.5)
Neutrophil %: 84.2 %
Platelet: 422 10*3/uL (ref 150–440)
RBC: 3.79 10*6/uL — AB (ref 3.80–5.20)
RDW: 21 % — ABNORMAL HIGH (ref 11.5–14.5)
WBC: 10 10*3/uL (ref 3.6–11.0)

## 2014-01-27 LAB — CULTURE, BLOOD (SINGLE)

## 2014-02-20 DIAGNOSIS — M1990 Unspecified osteoarthritis, unspecified site: Secondary | ICD-10-CM | POA: Insufficient documentation

## 2014-04-16 ENCOUNTER — Emergency Department: Payer: Self-pay | Admitting: Emergency Medicine

## 2014-04-16 LAB — COMPREHENSIVE METABOLIC PANEL
ALK PHOS: 79 U/L
Albumin: 2.8 g/dL — ABNORMAL LOW (ref 3.4–5.0)
Anion Gap: 8 (ref 7–16)
BUN: 12 mg/dL (ref 7–18)
Bilirubin,Total: 0.2 mg/dL (ref 0.2–1.0)
CHLORIDE: 97 mmol/L — AB (ref 98–107)
CO2: 30 mmol/L (ref 21–32)
Calcium, Total: 8.3 mg/dL — ABNORMAL LOW (ref 8.5–10.1)
Creatinine: 0.66 mg/dL (ref 0.60–1.30)
GLUCOSE: 145 mg/dL — AB (ref 65–99)
Osmolality: 272 (ref 275–301)
Potassium: 4.2 mmol/L (ref 3.5–5.1)
SGOT(AST): 41 U/L — ABNORMAL HIGH (ref 15–37)
SGPT (ALT): 33 U/L
Sodium: 135 mmol/L — ABNORMAL LOW (ref 136–145)
Total Protein: 7.4 g/dL (ref 6.4–8.2)

## 2014-04-16 LAB — CBC
HCT: 23.5 % — AB (ref 35.0–47.0)
HGB: 6.9 g/dL — AB (ref 12.0–16.0)
MCH: 19.2 pg — ABNORMAL LOW (ref 26.0–34.0)
MCHC: 29.3 g/dL — ABNORMAL LOW (ref 32.0–36.0)
MCV: 66 fL — ABNORMAL LOW (ref 80–100)
Platelet: 530 10*3/uL — ABNORMAL HIGH (ref 150–440)
RBC: 3.59 10*6/uL — AB (ref 3.80–5.20)
RDW: 20.6 % — ABNORMAL HIGH (ref 11.5–14.5)
WBC: 7.4 10*3/uL (ref 3.6–11.0)

## 2014-05-01 ENCOUNTER — Emergency Department: Payer: Self-pay | Admitting: Emergency Medicine

## 2014-05-01 ENCOUNTER — Ambulatory Visit: Payer: Self-pay | Admitting: Oncology

## 2014-05-01 LAB — CBC CANCER CENTER
Basophil #: 0 x10 3/mm (ref 0.0–0.1)
Basophil %: 0.7 %
Eosinophil #: 0.2 x10 3/mm (ref 0.0–0.7)
Eosinophil %: 3.1 %
HCT: 23.6 % — ABNORMAL LOW (ref 35.0–47.0)
HGB: 6.8 g/dL — ABNORMAL LOW (ref 12.0–16.0)
LYMPHS ABS: 2 x10 3/mm (ref 1.0–3.6)
Lymphocyte %: 27 %
MCH: 18.4 pg — AB (ref 26.0–34.0)
MCHC: 28.6 g/dL — ABNORMAL LOW (ref 32.0–36.0)
MCV: 64 fL — AB (ref 80–100)
MONO ABS: 1.3 x10 3/mm — AB (ref 0.2–0.9)
MONOS PCT: 18.4 %
Neutrophil #: 3.7 x10 3/mm (ref 1.4–6.5)
Neutrophil %: 50.8 %
Platelet: 472 x10 3/mm — ABNORMAL HIGH (ref 150–440)
RBC: 3.67 10*6/uL — AB (ref 3.80–5.20)
RDW: 19.5 % — ABNORMAL HIGH (ref 11.5–14.5)
WBC: 7.2 x10 3/mm (ref 3.6–11.0)

## 2014-05-01 LAB — IRON AND TIBC
Iron Bind.Cap.(Total): 508 ug/dL — ABNORMAL HIGH (ref 250–450)
Iron Saturation: 4 %
Iron: 20 ug/dL — ABNORMAL LOW (ref 50–170)
Unbound Iron-Bind.Cap.: 488 ug/dL

## 2014-05-01 LAB — FERRITIN: FERRITIN (ARMC): 8 ng/mL (ref 8–388)

## 2014-05-01 LAB — FOLATE: Folic Acid: 13.4 ng/mL (ref 3.1–100.0)

## 2014-05-01 LAB — LACTATE DEHYDROGENASE: LDH: 171 U/L (ref 81–246)

## 2014-05-01 LAB — RETICULOCYTES
Absolute Retic Count: 0.096 10*6/uL (ref 0.019–0.186)
Reticulocyte: 2.6 % (ref 0.4–3.1)

## 2014-05-03 LAB — PROT IMMUNOELECTROPHORES(ARMC)

## 2014-05-11 ENCOUNTER — Inpatient Hospital Stay: Payer: Self-pay | Admitting: General Surgery

## 2014-05-11 LAB — COMPREHENSIVE METABOLIC PANEL
ANION GAP: 10 (ref 7–16)
Albumin: 2.8 g/dL — ABNORMAL LOW (ref 3.4–5.0)
Alkaline Phosphatase: 62 U/L
BUN: 22 mg/dL — ABNORMAL HIGH (ref 7–18)
Bilirubin,Total: 0.3 mg/dL (ref 0.2–1.0)
CALCIUM: 8.1 mg/dL — AB (ref 8.5–10.1)
CHLORIDE: 101 mmol/L (ref 98–107)
Co2: 23 mmol/L (ref 21–32)
Creatinine: 0.9 mg/dL (ref 0.60–1.30)
Glucose: 154 mg/dL — ABNORMAL HIGH (ref 65–99)
OSMOLALITY: 275 (ref 275–301)
POTASSIUM: 4.5 mmol/L (ref 3.5–5.1)
SGOT(AST): 15 U/L (ref 15–37)
SGPT (ALT): 19 U/L
Sodium: 134 mmol/L — ABNORMAL LOW (ref 136–145)
Total Protein: 7.2 g/dL (ref 6.4–8.2)

## 2014-05-11 LAB — TSH: Thyroid Stimulating Horm: 6.96 u[IU]/mL — ABNORMAL HIGH

## 2014-05-11 LAB — CBC
HCT: 21.5 % — AB (ref 35.0–47.0)
HGB: 6 g/dL — ABNORMAL LOW (ref 12.0–16.0)
MCH: 18.2 pg — ABNORMAL LOW (ref 26.0–34.0)
MCHC: 28 g/dL — ABNORMAL LOW (ref 32.0–36.0)
MCV: 65 fL — ABNORMAL LOW (ref 80–100)
Platelet: 442 10*3/uL — ABNORMAL HIGH (ref 150–440)
RBC: 3.3 10*6/uL — ABNORMAL LOW (ref 3.80–5.20)
RDW: 19.7 % — ABNORMAL HIGH (ref 11.5–14.5)
WBC: 13.1 10*3/uL — AB (ref 3.6–11.0)

## 2014-05-11 LAB — CK TOTAL AND CKMB (NOT AT ARMC)
CK, Total: 50 U/L
CK-MB: 1 ng/mL (ref 0.5–3.6)

## 2014-05-11 LAB — T4, FREE: FREE THYROXINE: 1.03 ng/dL (ref 0.76–1.46)

## 2014-05-11 LAB — TROPONIN I

## 2014-05-11 LAB — PRO B NATRIURETIC PEPTIDE: B-TYPE NATIURETIC PEPTID: 4152 pg/mL — AB (ref 0–450)

## 2014-05-12 DIAGNOSIS — I341 Nonrheumatic mitral (valve) prolapse: Secondary | ICD-10-CM

## 2014-05-12 LAB — BASIC METABOLIC PANEL
Anion Gap: 11 (ref 7–16)
BUN: 20 mg/dL — ABNORMAL HIGH (ref 7–18)
CHLORIDE: 100 mmol/L (ref 98–107)
Calcium, Total: 7.8 mg/dL — ABNORMAL LOW (ref 8.5–10.1)
Co2: 26 mmol/L (ref 21–32)
Creatinine: 0.76 mg/dL (ref 0.60–1.30)
EGFR (African American): >60
EGFR (Non-African Amer.): >60
Glucose: 231 mg/dL — ABNORMAL HIGH (ref 65–99)
Osmolality: 284 (ref 275–301)
POTASSIUM: 4 mmol/L (ref 3.5–5.1)
SODIUM: 137 mmol/L (ref 136–145)

## 2014-05-12 LAB — LIPID PANEL
Cholesterol: 162 mg/dL (ref 0–200)
HDL: 27 mg/dL — AB (ref 40–60)
LDL CHOLESTEROL, CALC: 110 mg/dL — AB (ref 0–100)
TRIGLYCERIDES: 127 mg/dL (ref 0–200)
VLDL Cholesterol, Calc: 25 mg/dL (ref 5–40)

## 2014-05-12 LAB — CBC WITH DIFFERENTIAL/PLATELET
BASOS PCT: 0.5 %
Basophil #: 0.1 10*3/uL (ref 0.0–0.1)
Basophil #: 0.1 10*3/uL (ref 0.0–0.1)
Basophil %: 0.4 %
EOS ABS: 0 10*3/uL (ref 0.0–0.7)
EOS PCT: 0.3 %
Eosinophil #: 0.1 10*3/uL (ref 0.0–0.7)
Eosinophil %: 1.1 %
HCT: 25.9 % — ABNORMAL LOW (ref 35.0–47.0)
HCT: 27 % — ABNORMAL LOW (ref 35.0–47.0)
HGB: 7.3 g/dL — ABNORMAL LOW (ref 12.0–16.0)
HGB: 7.5 g/dL — ABNORMAL LOW (ref 12.0–16.0)
LYMPHS PCT: 7.4 %
Lymphocyte #: 1.4 10*3/uL (ref 1.0–3.6)
Lymphocyte #: 1 10*3/uL (ref 1.0–3.6)
Lymphocyte %: 10.5 %
MCH: 19.2 pg — AB (ref 26.0–34.0)
MCH: 19.1 pg — ABNORMAL LOW (ref 26.0–34.0)
MCHC: 28.3 g/dL — ABNORMAL LOW (ref 32.0–36.0)
MCHC: 27.7 g/dL — AB (ref 32.0–36.0)
MCV: 68 fL — AB (ref 80–100)
MCV: 69 fL — AB (ref 80–100)
Monocyte #: 1.8 x10 3/mm — ABNORMAL HIGH (ref 0.2–0.9)
Monocyte #: 1.1 x10 3/mm — ABNORMAL HIGH (ref 0.2–0.9)
Monocyte %: 13.4 %
Monocyte %: 7.7 %
NEUTROS ABS: 11.6 10*3/uL — AB (ref 1.4–6.5)
NEUTROS PCT: 74.6 %
Neutrophil #: 9.8 10*3/uL — ABNORMAL HIGH (ref 1.4–6.5)
Neutrophil %: 84.1 %
PLATELETS: 431 10*3/uL (ref 150–440)
PLATELETS: 437 10*3/uL (ref 150–440)
RBC: 3.81 10*6/uL (ref 3.80–5.20)
RBC: 3.92 10*6/uL (ref 3.80–5.20)
RDW: 22.1 % — ABNORMAL HIGH (ref 11.5–14.5)
RDW: 22.7 % — AB (ref 11.5–14.5)
WBC: 13.2 10*3/uL — ABNORMAL HIGH (ref 3.6–11.0)
WBC: 13.8 10*3/uL — ABNORMAL HIGH (ref 3.6–11.0)

## 2014-05-12 LAB — URINALYSIS, COMPLETE
Bilirubin,UR: NEGATIVE
Glucose,UR: NEGATIVE mg/dL (ref 0–75)
Ketone: NEGATIVE
Nitrite: NEGATIVE
PH: 5 (ref 4.5–8.0)
PROTEIN: NEGATIVE
RBC,UR: 79 /HPF (ref 0–5)
Specific Gravity: 1.015 (ref 1.003–1.030)
Squamous Epithelial: 1

## 2014-05-12 LAB — TROPONIN I: Troponin-I: <0.02 ng/mL

## 2014-05-12 LAB — CK TOTAL AND CKMB (NOT AT ARMC)
CK, TOTAL: 52 U/L
CK, Total: 52 U/L
CK-MB: 1.5 ng/mL (ref 0.5–3.6)
CK-MB: 1.6 ng/mL (ref 0.5–3.6)

## 2014-05-15 LAB — CBC WITH DIFFERENTIAL/PLATELET
HCT: 27.6 % — AB (ref 35.0–47.0)
HGB: 7.7 g/dL — ABNORMAL LOW (ref 12.0–16.0)
Lymphocytes: 18 %
MCH: 21.2 pg — ABNORMAL LOW (ref 26.0–34.0)
MCHC: 27.9 g/dL — AB (ref 32.0–36.0)
MCV: 76 fL — AB (ref 80–100)
MONOS PCT: 10 %
PLATELETS: 449 10*3/uL — AB (ref 150–440)
RBC: 3.63 10*6/uL — ABNORMAL LOW (ref 3.80–5.20)
RDW: 23.2 % — ABNORMAL HIGH (ref 11.5–14.5)
Segmented Neutrophils: 72 %
WBC: 15.9 10*3/uL — ABNORMAL HIGH (ref 3.6–11.0)

## 2014-05-15 LAB — OCCULT BLOOD X 1 CARD TO LAB, STOOL: Occult Blood, Feces: POSITIVE

## 2014-05-16 ENCOUNTER — Encounter: Payer: Self-pay | Admitting: Internal Medicine

## 2014-05-16 LAB — BASIC METABOLIC PANEL
Anion Gap: 7 (ref 7–16)
BUN: 32 mg/dL — AB (ref 7–18)
CALCIUM: 7.9 mg/dL — AB (ref 8.5–10.1)
CO2: 33 mmol/L — AB (ref 21–32)
Chloride: 99 mmol/L (ref 98–107)
Creatinine: 0.83 mg/dL (ref 0.60–1.30)
EGFR (African American): >60
EGFR (Non-African Amer.): >60
GLUCOSE: 121 mg/dL — AB (ref 65–99)
Osmolality: 286 (ref 275–301)
POTASSIUM: 3.8 mmol/L (ref 3.5–5.1)
Sodium: 139 mmol/L (ref 136–145)

## 2014-05-16 LAB — CBC WITH DIFFERENTIAL/PLATELET
BASOS ABS: 0 10*3/uL (ref 0.0–0.1)
Basophil %: 0 %
Eosinophil #: 0 10*3/uL (ref 0.0–0.7)
Eosinophil %: 0.2 %
HCT: 25.7 % — AB (ref 35.0–47.0)
HGB: 7 g/dL — ABNORMAL LOW (ref 12.0–16.0)
LYMPHS ABS: 2.7 10*3/uL (ref 1.0–3.6)
Lymphocyte %: 18.3 %
MCH: 20.8 pg — AB (ref 26.0–34.0)
MCHC: 27.2 g/dL — ABNORMAL LOW (ref 32.0–36.0)
MCV: 77 fL — AB (ref 80–100)
MONO ABS: 2.3 x10 3/mm — AB (ref 0.2–0.9)
MONOS PCT: 15.9 %
NEUTROS ABS: 9.6 10*3/uL — AB (ref 1.4–6.5)
NEUTROS PCT: 65.6 %
Platelet: 379 10*3/uL (ref 150–440)
RBC: 3.35 10*6/uL — AB (ref 3.80–5.20)
RDW: 23.1 % — ABNORMAL HIGH (ref 11.5–14.5)
WBC: 14.7 10*3/uL — ABNORMAL HIGH (ref 3.6–11.0)

## 2014-05-17 LAB — CBC WITH DIFFERENTIAL/PLATELET
BASOS PCT: 0.1 %
Basophil #: 0 10*3/uL (ref 0.0–0.1)
Eosinophil #: 0.1 10*3/uL (ref 0.0–0.7)
Eosinophil %: 0.7 %
HCT: 29.5 % — ABNORMAL LOW (ref 35.0–47.0)
HGB: 8.8 g/dL — AB (ref 12.0–16.0)
Lymphocyte #: 2.5 10*3/uL (ref 1.0–3.6)
Lymphocyte %: 25 %
MCH: 23.5 pg — AB (ref 26.0–34.0)
MCHC: 29.8 g/dL — AB (ref 32.0–36.0)
MCV: 79 fL — ABNORMAL LOW (ref 80–100)
Monocyte #: 1.4 x10 3/mm — ABNORMAL HIGH (ref 0.2–0.9)
Monocyte %: 14.3 %
NEUTROS ABS: 6 10*3/uL (ref 1.4–6.5)
Neutrophil %: 59.9 %
PLATELETS: 349 10*3/uL (ref 150–440)
RBC: 3.74 10*6/uL — ABNORMAL LOW (ref 3.80–5.20)
RDW: 31.2 % — ABNORMAL HIGH (ref 11.5–14.5)
WBC: 10 10*3/uL (ref 3.6–11.0)

## 2014-05-20 ENCOUNTER — Ambulatory Visit: Payer: Self-pay | Admitting: Oncology

## 2014-05-20 ENCOUNTER — Encounter: Payer: Self-pay | Admitting: Internal Medicine

## 2014-06-05 DIAGNOSIS — E872 Acidosis, unspecified: Secondary | ICD-10-CM | POA: Insufficient documentation

## 2014-06-05 DIAGNOSIS — E8729 Other acidosis: Secondary | ICD-10-CM | POA: Insufficient documentation

## 2014-06-05 DIAGNOSIS — E119 Type 2 diabetes mellitus without complications: Secondary | ICD-10-CM | POA: Insufficient documentation

## 2014-06-05 DIAGNOSIS — R0689 Other abnormalities of breathing: Secondary | ICD-10-CM | POA: Insufficient documentation

## 2014-06-05 DIAGNOSIS — J811 Chronic pulmonary edema: Secondary | ICD-10-CM | POA: Insufficient documentation

## 2014-06-05 DIAGNOSIS — I5022 Chronic systolic (congestive) heart failure: Secondary | ICD-10-CM | POA: Insufficient documentation

## 2014-06-19 ENCOUNTER — Encounter: Payer: Self-pay | Admitting: Internal Medicine

## 2014-07-05 ENCOUNTER — Ambulatory Visit: Payer: Self-pay | Admitting: Oncology

## 2014-07-20 ENCOUNTER — Encounter: Payer: Self-pay | Admitting: Internal Medicine

## 2014-07-25 ENCOUNTER — Inpatient Hospital Stay: Payer: Self-pay | Admitting: Internal Medicine

## 2014-07-25 ENCOUNTER — Ambulatory Visit: Payer: Self-pay | Admitting: Oncology

## 2014-07-25 LAB — CBC WITH DIFFERENTIAL/PLATELET
BASOS PCT: 0.5 %
Basophil #: 0 10*3/uL (ref 0.0–0.1)
EOS ABS: 0.1 10*3/uL (ref 0.0–0.7)
EOS PCT: 1.6 %
HCT: 19.2 % — AB (ref 35.0–47.0)
HGB: 5.6 g/dL — ABNORMAL LOW (ref 12.0–16.0)
Lymphocyte #: 1.8 10*3/uL (ref 1.0–3.6)
Lymphocyte %: 22.7 %
MCH: 22.4 pg — ABNORMAL LOW (ref 26.0–34.0)
MCHC: 29 g/dL — ABNORMAL LOW (ref 32.0–36.0)
MCV: 77 fL — ABNORMAL LOW (ref 80–100)
MONO ABS: 1.3 x10 3/mm — AB (ref 0.2–0.9)
Monocyte %: 16.5 %
NEUTROS PCT: 58.7 %
Neutrophil #: 4.6 10*3/uL (ref 1.4–6.5)
PLATELETS: 441 10*3/uL — AB (ref 150–440)
RBC: 2.49 10*6/uL — AB (ref 3.80–5.20)
RDW: 22.9 % — ABNORMAL HIGH (ref 11.5–14.5)
WBC: 7.9 10*3/uL (ref 3.6–11.0)

## 2014-07-25 LAB — URINALYSIS, COMPLETE
Bilirubin,UR: NEGATIVE
Blood: NEGATIVE
Glucose,UR: NEGATIVE mg/dL (ref 0–75)
KETONE: NEGATIVE
Nitrite: NEGATIVE
Ph: 5 (ref 4.5–8.0)
Protein: 30
RBC,UR: 4 /HPF (ref 0–5)
SPECIFIC GRAVITY: 1.016 (ref 1.003–1.030)

## 2014-07-25 LAB — BASIC METABOLIC PANEL
Anion Gap: 7 (ref 7–16)
BUN: 13 mg/dL (ref 7–18)
CHLORIDE: 98 mmol/L (ref 98–107)
CO2: 30 mmol/L (ref 21–32)
CREATININE: 0.91 mg/dL (ref 0.60–1.30)
Calcium, Total: 8 mg/dL — ABNORMAL LOW (ref 8.5–10.1)
EGFR (African American): >60
EGFR (Non-African Amer.): >60
Glucose: 138 mg/dL — ABNORMAL HIGH (ref 65–99)
Osmolality: 272 (ref 275–301)
POTASSIUM: 4.3 mmol/L (ref 3.5–5.1)
SODIUM: 135 mmol/L — AB (ref 136–145)

## 2014-07-25 LAB — TROPONIN I: Troponin-I: <0.02 ng/mL

## 2014-07-26 LAB — CBC WITH DIFFERENTIAL/PLATELET
BASOS ABS: 0.1 10*3/uL (ref 0.0–0.1)
BASOS PCT: 0.6 %
EOS ABS: 0.1 10*3/uL (ref 0.0–0.7)
Eosinophil %: 1 %
HCT: 23.9 % — AB (ref 35.0–47.0)
HGB: 7.6 g/dL — AB (ref 12.0–16.0)
Lymphocyte #: 1.1 10*3/uL (ref 1.0–3.6)
Lymphocyte %: 14.2 %
MCH: 24.9 pg — AB (ref 26.0–34.0)
MCHC: 31.7 g/dL — ABNORMAL LOW (ref 32.0–36.0)
MCV: 79 fL — ABNORMAL LOW (ref 80–100)
MONOS PCT: 14.8 %
Monocyte #: 1.2 x10 3/mm — ABNORMAL HIGH (ref 0.2–0.9)
Neutrophil #: 5.5 10*3/uL (ref 1.4–6.5)
Neutrophil %: 69.4 %
Platelet: 401 10*3/uL (ref 150–440)
RBC: 3.04 10*6/uL — ABNORMAL LOW (ref 3.80–5.20)
RDW: 21.2 % — ABNORMAL HIGH (ref 11.5–14.5)
WBC: 7.9 10*3/uL (ref 3.6–11.0)

## 2014-07-26 LAB — BASIC METABOLIC PANEL
ANION GAP: 6 — AB (ref 7–16)
BUN: 10 mg/dL (ref 7–18)
CALCIUM: 7.4 mg/dL — AB (ref 8.5–10.1)
CO2: 33 mmol/L — AB (ref 21–32)
CREATININE: 0.8 mg/dL (ref 0.60–1.30)
Chloride: 97 mmol/L — ABNORMAL LOW (ref 98–107)
EGFR (African American): >60
EGFR (Non-African Amer.): >60
GLUCOSE: 125 mg/dL — AB (ref 65–99)
OSMOLALITY: 272 (ref 275–301)
Potassium: 4 mmol/L (ref 3.5–5.1)
Sodium: 136 mmol/L (ref 136–145)

## 2014-07-26 LAB — PRO B NATRIURETIC PEPTIDE: B-Type Natriuretic Peptide: 2342 pg/mL — ABNORMAL HIGH (ref 0–450)

## 2014-07-27 LAB — IRON AND TIBC
IRON BIND. CAP.(TOTAL): 395 ug/dL (ref 250–450)
Iron Saturation: 7 %
Iron: 27 ug/dL — ABNORMAL LOW (ref 50–170)
Unbound Iron-Bind.Cap.: 368 ug/dL

## 2014-07-27 LAB — BASIC METABOLIC PANEL
Anion Gap: 8 (ref 7–16)
BUN: 9 mg/dL (ref 7–18)
Calcium, Total: 7.6 mg/dL — ABNORMAL LOW (ref 8.5–10.1)
Chloride: 96 mmol/L — ABNORMAL LOW (ref 98–107)
Co2: 33 mmol/L — ABNORMAL HIGH (ref 21–32)
Creatinine: 0.77 mg/dL (ref 0.60–1.30)
EGFR (African American): >60
EGFR (Non-African Amer.): >60
Glucose: 123 mg/dL — ABNORMAL HIGH (ref 65–99)
OSMOLALITY: 274 (ref 275–301)
Potassium: 3.6 mmol/L (ref 3.5–5.1)
Sodium: 137 mmol/L (ref 136–145)

## 2014-07-27 LAB — HEMOGLOBIN: HGB: 7.2 g/dL — AB (ref 12.0–16.0)

## 2014-07-27 LAB — FERRITIN: Ferritin (ARMC): 19 ng/mL (ref 8–388)

## 2014-07-27 LAB — URINE CULTURE

## 2014-07-28 LAB — HEMOGLOBIN: HGB: 9.3 g/dL — ABNORMAL LOW (ref 12.0–16.0)

## 2014-07-29 LAB — HEMOGLOBIN: HGB: 9.6 g/dL — ABNORMAL LOW (ref 12.0–16.0)

## 2014-07-31 LAB — BASIC METABOLIC PANEL
Anion Gap: 7 (ref 7–16)
BUN: 9 mg/dL (ref 7–18)
CO2: 33 mmol/L — AB (ref 21–32)
Calcium, Total: 8.3 mg/dL — ABNORMAL LOW (ref 8.5–10.1)
Chloride: 99 mmol/L (ref 98–107)
Creatinine: 0.81 mg/dL (ref 0.60–1.30)
EGFR (African American): >60
EGFR (Non-African Amer.): >60
Glucose: 116 mg/dL — ABNORMAL HIGH (ref 65–99)
Osmolality: 277 (ref 275–301)
POTASSIUM: 3.3 mmol/L — AB (ref 3.5–5.1)
SODIUM: 139 mmol/L (ref 136–145)

## 2014-07-31 LAB — HEMOGLOBIN: HGB: 9.6 g/dL — AB (ref 12.0–16.0)

## 2014-08-02 LAB — POTASSIUM: POTASSIUM: 3.7 mmol/L (ref 3.5–5.1)

## 2014-08-02 LAB — HEMOGLOBIN: HGB: 10.3 g/dL — ABNORMAL LOW (ref 12.0–16.0)

## 2014-08-02 LAB — MAGNESIUM: Magnesium: 2 mg/dL

## 2014-08-20 ENCOUNTER — Ambulatory Visit: Payer: Self-pay | Admitting: Oncology

## 2014-09-28 ENCOUNTER — Ambulatory Visit
Admit: 2014-09-28 | Disposition: A | Discharge status: Home / Self Care | Payer: Self-pay | Attending: Oncology | Admitting: Oncology

## 2014-09-28 ENCOUNTER — Ambulatory Visit
Admit: 2014-09-28 | Disposition: A | Discharge status: Home / Self Care | Payer: Self-pay | Attending: Family Medicine | Admitting: Family Medicine

## 2014-10-10 ENCOUNTER — Inpatient Hospital Stay
Admit: 2014-10-10 | Disposition: A | Discharge status: Skilled Nursing Facility | Payer: Self-pay | Attending: Internal Medicine | Admitting: Internal Medicine

## 2014-10-10 LAB — URINALYSIS, COMPLETE
Bacteria: NONE SEEN
Bilirubin,UR: NEGATIVE
Blood: NEGATIVE
Glucose,UR: NEGATIVE mg/dL (ref 0–75)
Hyaline Cast: 3
Ketone: NEGATIVE
Leukocyte Esterase: NEGATIVE
NITRITE: NEGATIVE
PROTEIN: NEGATIVE
Ph: 5 (ref 4.5–8.0)
Specific Gravity: 1.015 (ref 1.003–1.030)
Squamous Epithelial: NONE SEEN
WBC UR: 1 /HPF (ref 0–5)

## 2014-10-11 LAB — CBC WITH DIFFERENTIAL/PLATELET
BASOS PCT: 0.3 %
Basophil #: 0 10*3/uL (ref 0.0–0.1)
EOS PCT: 0 %
Eosinophil #: 0 10*3/uL (ref 0.0–0.7)
HCT: 26 % — ABNORMAL LOW (ref 35.0–47.0)
HGB: 7.6 g/dL — ABNORMAL LOW (ref 12.0–16.0)
LYMPHS ABS: 0.9 10*3/uL — AB (ref 1.0–3.6)
Lymphocyte %: 7.6 %
MCH: 25.9 pg — ABNORMAL LOW (ref 26.0–34.0)
MCHC: 29.3 g/dL — ABNORMAL LOW (ref 32.0–36.0)
MCV: 88 fL (ref 80–100)
Monocyte #: 1.2 x10 3/mm — ABNORMAL HIGH (ref 0.2–0.9)
Monocyte %: 9.8 %
NEUTROS PCT: 82.3 %
Neutrophil #: 10 10*3/uL — ABNORMAL HIGH (ref 1.4–6.5)
Platelet: 225 10*3/uL (ref 150–440)
RBC: 2.95 10*6/uL — AB (ref 3.80–5.20)
RDW: 26.3 % — ABNORMAL HIGH (ref 11.5–14.5)
WBC: 12.1 10*3/uL — ABNORMAL HIGH (ref 3.6–11.0)

## 2014-10-11 LAB — BASIC METABOLIC PANEL
Anion Gap: 3 — ABNORMAL LOW (ref 7–16)
BUN: 15 mg/dL
CALCIUM: 7.5 mg/dL — AB
CHLORIDE: 100 mmol/L — AB
CO2: 31 mmol/L
Creatinine: 0.74 mg/dL
EGFR (Non-African Amer.): >60
Glucose: 145 mg/dL — ABNORMAL HIGH
Potassium: 3 mmol/L — ABNORMAL LOW
Sodium: 134 mmol/L — ABNORMAL LOW

## 2014-10-11 LAB — RAPID INFLUENZA A&B ANTIGENS

## 2014-10-11 LAB — PHOSPHORUS: Phosphorus: 2.4 mg/dL — ABNORMAL LOW

## 2014-10-11 LAB — MAGNESIUM: Magnesium: 2 mg/dL

## 2014-10-12 LAB — IRON AND TIBC
IRON SATURATION: 14.4
Iron Bind.Cap.(Total): 270 (ref 250–450)
Iron: 39 ug/dL
Unbound Iron-Bind.Cap.: 231.3

## 2014-10-12 LAB — BASIC METABOLIC PANEL
ANION GAP: 14 (ref 7–16)
BUN: 27 mg/dL — AB
CREATININE: 1.37 mg/dL — AB
Calcium, Total: 8.1 mg/dL — ABNORMAL LOW
Chloride: 100 mmol/L — ABNORMAL LOW
Co2: 20 mmol/L — ABNORMAL LOW
EGFR (African American): 42 — ABNORMAL LOW
EGFR (Non-African Amer.): 36 — ABNORMAL LOW
Glucose: 246 mg/dL — ABNORMAL HIGH
POTASSIUM: 3.9 mmol/L
Sodium: 134 mmol/L — ABNORMAL LOW

## 2014-10-12 LAB — VALPROIC ACID LEVEL: Valproic Acid: 12 ug/mL — ABNORMAL LOW (ref 50–100)

## 2014-10-12 LAB — FERRITIN: FERRITIN (ARMC): 1091 ng/mL — AB

## 2014-10-12 LAB — LACTATE DEHYDROGENASE: LDH: 163 U/L

## 2014-10-12 LAB — MAGNESIUM: Magnesium: 1.9 mg/dL

## 2014-10-12 LAB — HEMOGLOBIN A1C: Hemoglobin A1C: 5.7 %

## 2014-10-13 LAB — CBC WITH DIFFERENTIAL/PLATELET
BASOS ABS: 0 10*3/uL (ref 0.0–0.1)
Basophil %: 0.1 %
EOS ABS: 0 10*3/uL (ref 0.0–0.7)
Eosinophil %: 0 %
HCT: 25.9 % — AB (ref 35.0–47.0)
HGB: 7.6 g/dL — AB (ref 12.0–16.0)
LYMPHS PCT: 1.3 %
Lymphocyte #: 0.3 10*3/uL — ABNORMAL LOW (ref 1.0–3.6)
MCH: 25.7 pg — ABNORMAL LOW (ref 26.0–34.0)
MCHC: 29.5 g/dL — AB (ref 32.0–36.0)
MCV: 87 fL (ref 80–100)
Monocyte #: 2.1 x10 3/mm — ABNORMAL HIGH (ref 0.2–0.9)
Monocyte %: 9.5 %
Neutrophil #: 19.7 10*3/uL — ABNORMAL HIGH (ref 1.4–6.5)
Neutrophil %: 89.1 %
Platelet: 310 10*3/uL (ref 150–440)
RBC: 2.97 10*6/uL — ABNORMAL LOW (ref 3.80–5.20)
RDW: 25.2 % — AB (ref 11.5–14.5)
WBC: 22.1 10*3/uL — ABNORMAL HIGH (ref 3.6–11.0)

## 2014-10-13 LAB — BASIC METABOLIC PANEL
Anion Gap: 5 — ABNORMAL LOW (ref 7–16)
BUN: 30 mg/dL — AB
CHLORIDE: 103 mmol/L
CREATININE: 0.93 mg/dL
Calcium, Total: 8.3 mg/dL — ABNORMAL LOW
Co2: 29 mmol/L
EGFR (Non-African Amer.): 58 — ABNORMAL LOW
Glucose: 129 mg/dL — ABNORMAL HIGH
POTASSIUM: 4.8 mmol/L
Sodium: 137 mmol/L

## 2014-10-13 LAB — MAGNESIUM: Magnesium: 2 mg/dL

## 2014-10-13 LAB — PHOSPHORUS: PHOSPHORUS: 2.7 mg/dL

## 2014-10-13 LAB — VANCOMYCIN, TROUGH: Vancomycin, Trough: 11 ug/mL

## 2014-10-14 LAB — BASIC METABOLIC PANEL
Anion Gap: 7 (ref 7–16)
BUN: 32 mg/dL — AB
CALCIUM: 8.7 mg/dL — AB
CHLORIDE: 100 mmol/L — AB
Co2: 31 mmol/L
Creatinine: 0.9 mg/dL
GFR CALC NON AF AMER: 60 — AB
Glucose: 215 mg/dL — ABNORMAL HIGH
Potassium: 4.6 mmol/L
SODIUM: 138 mmol/L

## 2014-10-14 LAB — CBC WITH DIFFERENTIAL/PLATELET
BASOS ABS: 0 10*3/uL (ref 0.0–0.1)
Basophil %: 0.1 %
EOS ABS: 0 10*3/uL (ref 0.0–0.7)
Eosinophil %: 0 %
HCT: 28.4 % — ABNORMAL LOW (ref 35.0–47.0)
HGB: 8.7 g/dL — ABNORMAL LOW (ref 12.0–16.0)
Lymphocyte #: 0.5 10*3/uL — ABNORMAL LOW (ref 1.0–3.6)
Lymphocyte %: 2 %
MCH: 26.3 pg (ref 26.0–34.0)
MCHC: 30.8 g/dL — AB (ref 32.0–36.0)
MCV: 86 fL (ref 80–100)
Monocyte #: 2.1 x10 3/mm — ABNORMAL HIGH (ref 0.2–0.9)
Monocyte %: 8 %
NEUTROS ABS: 23.2 10*3/uL — AB (ref 1.4–6.5)
NEUTROS PCT: 89.9 %
Platelet: 395 10*3/uL (ref 150–440)
RBC: 3.32 10*6/uL — ABNORMAL LOW (ref 3.80–5.20)
RDW: 25.1 % — AB (ref 11.5–14.5)
WBC: 25.8 10*3/uL — ABNORMAL HIGH (ref 3.6–11.0)

## 2014-10-15 LAB — CBC WITH DIFFERENTIAL/PLATELET
Basophil #: 0 10*3/uL (ref 0.0–0.1)
Basophil %: 0.3 %
Eosinophil #: 0 10*3/uL (ref 0.0–0.7)
Eosinophil %: 0.2 %
HCT: 28.2 % — AB (ref 35.0–47.0)
HGB: 8.5 g/dL — ABNORMAL LOW (ref 12.0–16.0)
LYMPHS ABS: 1.1 10*3/uL (ref 1.0–3.6)
LYMPHS PCT: 8.1 %
MCH: 25.9 pg — ABNORMAL LOW (ref 26.0–34.0)
MCHC: 30 g/dL — ABNORMAL LOW (ref 32.0–36.0)
MCV: 86 fL (ref 80–100)
MONO ABS: 1.5 x10 3/mm — AB (ref 0.2–0.9)
MONOS PCT: 10.8 %
Neutrophil #: 11.2 10*3/uL — ABNORMAL HIGH (ref 1.4–6.5)
Neutrophil %: 80.6 %
Platelet: 476 10*3/uL — ABNORMAL HIGH (ref 150–440)
RBC: 3.27 10*6/uL — ABNORMAL LOW (ref 3.80–5.20)
RDW: 25.2 % — ABNORMAL HIGH (ref 11.5–14.5)
WBC: 14 10*3/uL — ABNORMAL HIGH (ref 3.6–11.0)

## 2014-10-15 LAB — BASIC METABOLIC PANEL
Anion Gap: 9 (ref 7–16)
BUN: 24 mg/dL — AB
Calcium, Total: 8.5 mg/dL — ABNORMAL LOW
Chloride: 96 mmol/L — ABNORMAL LOW
Co2: 36 mmol/L — ABNORMAL HIGH
Creatinine: 0.62 mg/dL
EGFR (African American): >60
EGFR (Non-African Amer.): >60
Glucose: 151 mg/dL — ABNORMAL HIGH
POTASSIUM: 3.6 mmol/L
Sodium: 141 mmol/L

## 2014-10-15 LAB — OCCULT BLOOD X 1 CARD TO LAB, STOOL: Occult Blood, Feces: POSITIVE

## 2014-10-15 LAB — VANCOMYCIN, TROUGH: VANCOMYCIN, TROUGH: 18 ug/mL

## 2014-10-15 LAB — PROTIME-INR
INR: 1.1
PROTHROMBIN TIME: 14.8 s

## 2014-10-15 LAB — APTT

## 2014-10-16 LAB — CBC WITH DIFFERENTIAL/PLATELET
Basophil #: 0 10*3/uL (ref 0.0–0.1)
Basophil %: 0.4 %
Eosinophil #: 0.1 10*3/uL (ref 0.0–0.7)
Eosinophil %: 1.2 %
HCT: 28.4 % — ABNORMAL LOW (ref 35.0–47.0)
HGB: 8.8 g/dL — ABNORMAL LOW (ref 12.0–16.0)
Lymphocyte #: 1.5 10*3/uL (ref 1.0–3.6)
Lymphocyte %: 12.8 %
MCH: 26.3 pg (ref 26.0–34.0)
MCHC: 31 g/dL — ABNORMAL LOW (ref 32.0–36.0)
MCV: 85 fL (ref 80–100)
Monocyte #: 1.4 x10 3/mm — ABNORMAL HIGH (ref 0.2–0.9)
Monocyte %: 12 %
Neutrophil #: 8.4 10*3/uL — ABNORMAL HIGH (ref 1.4–6.5)
Neutrophil %: 73.6 %
Platelet: 602 10*3/uL — ABNORMAL HIGH (ref 150–440)
RBC: 3.34 10*6/uL — ABNORMAL LOW (ref 3.80–5.20)
RDW: 23.8 % — ABNORMAL HIGH (ref 11.5–14.5)
WBC: 11.4 10*3/uL — ABNORMAL HIGH (ref 3.6–11.0)

## 2014-10-16 LAB — BASIC METABOLIC PANEL
Anion Gap: 12 (ref 7–16)
BUN: 18 mg/dL
CREATININE: 0.61 mg/dL
Calcium, Total: 8.4 mg/dL — ABNORMAL LOW
Chloride: 96 mmol/L — ABNORMAL LOW
Co2: 34 mmol/L — ABNORMAL HIGH
GLUCOSE: 177 mg/dL — AB
Potassium: 3.6 mmol/L
Sodium: 142 mmol/L

## 2014-10-17 LAB — CBC WITH DIFFERENTIAL/PLATELET
Bands: 8 %
EOS PCT: 3 %
HCT: 29 % — ABNORMAL LOW (ref 35.0–47.0)
HGB: 8.8 g/dL — AB (ref 12.0–16.0)
Lymphocytes: 17 %
MCH: 25.9 pg — AB (ref 26.0–34.0)
MCHC: 30.4 g/dL — ABNORMAL LOW (ref 32.0–36.0)
MCV: 86 fL (ref 80–100)
MONOS PCT: 11 %
Metamyelocyte: 2 %
Myelocyte: 2 %
Platelet: 709 10*3/uL — ABNORMAL HIGH (ref 150–440)
RBC: 3.39 10*6/uL — AB (ref 3.80–5.20)
RDW: 24.9 % — ABNORMAL HIGH (ref 11.5–14.5)
SEGMENTED NEUTROPHILS: 57 %
WBC: 11.4 10*3/uL — ABNORMAL HIGH (ref 3.6–11.0)

## 2014-10-17 LAB — BASIC METABOLIC PANEL
Anion Gap: 11 (ref 7–16)
BUN: 16 mg/dL
CALCIUM: 8.2 mg/dL — AB
CO2: 34 mmol/L — AB
Chloride: 92 mmol/L — ABNORMAL LOW
Creatinine: 0.72 mg/dL
EGFR (African American): >60
EGFR (Non-African Amer.): >60
GLUCOSE: 188 mg/dL — AB
Potassium: 3 mmol/L — ABNORMAL LOW
Sodium: 137 mmol/L

## 2014-10-17 LAB — CULTURE, BLOOD (SINGLE)

## 2014-10-19 ENCOUNTER — Ambulatory Visit
Admit: 2014-10-19 | Disposition: A | Discharge status: Home / Self Care | Payer: Self-pay | Attending: Oncology | Admitting: Oncology

## 2014-10-19 ENCOUNTER — Ambulatory Visit
Admit: 2014-10-19 | Disposition: A | Discharge status: Home / Self Care | Payer: Self-pay | Attending: Family Medicine | Admitting: Family Medicine

## 2014-11-09 NOTE — Consult Note (Signed)
CC: anemia, acute resp failure with improvement.  Hgb up to 8.5, echo with EF of 50-55%,  VSS on 4L nasal canula with sat of 93%.  Chest sounds better in right upper area but still not great.  Discussed with Hospitalist about delaying sedated endoscopic procedures due to lung condition.  May need to do after been in rehab for a while.  Oxygen sat will surely drop during sedated procedure and this soon after resp arrest is not recommended.  Will follow.  Electronic Signatures: Manya Silvas (MD)  (Signed on 13-Feb-14 12:01)  Authored  Last Updated: 13-Feb-14 12:01 by Manya Silvas (MD)

## 2014-11-09 NOTE — Consult Note (Signed)
Chief Complaint:  Subjective/Chief Complaint No new complaints. On 2 L [place].  H and H stable at 8.2.  Impression; Anemia. No signs of active GI blood loss.  Recommendations: Follow H and H.  Dr. Vira Agar will resume care in am and decide about the time of further GI workup.   Electronic Signatures: Jill Side (MD)  (Signed 16-Feb-14 11:25)  Authored: Chief Complaint   Last Updated: 16-Feb-14 11:25 by Jill Side (MD)

## 2014-11-09 NOTE — Discharge Summary (Signed)
PATIENT NAME:  Theresa Vasquez, Theresa Vasquez MR#:  [id number] DATE OF BIRTH:  1931/05/03  DATE OF ADMISSION:  11/29/2012 DATE OF DISCHARGE: 12/06/2012    PRIMARY CARE PHYSICIAN: Sharlet Salina C. Hall Busing, MD  FINAL DIAGNOSES:  1. Acute on chronic respiratory failure, now requiring 2 liters of oxygen nasal cannula continuous.  2. Chronic obstructive pulmonary disease exacerbation and tobacco abuse.  3. Septic shock with Pasteurella.  4. Anemia.  5. Diabetes.  6. Hypertension.  7. Dementia with pseudobulbar affect.  8. Leukocytosis.  9. Hypokalemia.  10. PPD placed, can be read on 12/07/2012 in the afternoon.   MEDICATIONS ON DISCHARGE: Include:  1. A prednisone taper 10 mg 1 tablet day 1 and 2, half-tablet day 3,4, 5 and 6, and then stop. 2. Enalapril 10 mg daily.  3. Gabapentin 300 mg twice a day.  4. Lamotrigine 100 mg q.h.s.  5. Fluoxetine 40 mg in the morning.  6. Simvastatin 40 mg in the evening. 7. Xanax 0.5 mg twice a day as needed for anxiety. 8. Alendronate 70 mg once a week.  9. Albuterol/ ipratropium CFC 100 one puff 4 times a day. 10. Advair 250/50 one inhalation twice a day. 11. Spiriva 1 inhalation daily. 12. Donepezil, which is Aricept, 1 tablet q.h.s.  13. Ferrous sulfate 325 mg daily. 14. Protonix 40 mg daily. 15. Cipro 500 mg twice a day for 8 days.  16. Nicotine patch 21 mg per 24-hour chest film 1 patch transdermally daily.  17. Hydralazine 25 mg twice a day.  18. Vitamin D 1000 international units 1 tablet daily.   HOME HEALTH: Yes, physical therapy and nurse, help with strength and medications.  OXYGEN: 2 liters nasal cannula portable tank.   DIET: Low sodium diet, carbohydrate controlled diet, regular consistency.   ACTIVITY: As tolerated.   FOLLOWUP: With Dr. Benita Stabile in 1 to 2 weeks.   HISTORY OF PRESENT ILLNESS: The patient was admitted 11/29/2012, discharged 12/06/2012. The patient came in with shortness of breath. Was urgently brought to the ER and intubated for  respiratory failure. She was initially started on vancomycin, Zosyn and Zithromax, IV steroids and around-the-clock nebulizers. She was also in shock, requiring Levophed, and she has chronic iron deficiency anemia, on iron supplements.   LABORATORY AND RADIOLOGICAL DATA DURING THE HOSPITAL COURSE: Included an EKG that showed normal sinus rhythm, nonspecific intraventricular block. Chest x-ray showed diffuse interstitial prominence, edematous versus nonedematous interstitial infiltrate. Urinalysis: 25 mg/dL of protein, 15 mg/dL of glucose. Troponin negative. Glucose 233, BUN 8, creatinine 0.70, sodium 133, potassium 3.5, chloride 98, CO2 27, calcium 8.0. Liver function tests: Normal range. White blood cell count 27.5, hemoglobin 6.7, hematocrit 23.9, platelet count of 488, MCV 64. Blood culture grew out Pasteurella multocida. Other blood culture was negative. Ultrasound of the lower extremity: No evidence of DVT. It looks like the patient was transfused 2 units of packed red blood cells. Sputum culture: Normal flora. CT scan of the head on the 15th: No acute intracranial process. MRI of the brain without contrast showed extensive white matter signal abnormalities consistent with chronic small vessel ischemia, age-appropriate diffuse cerebral and cerebellar atrophy. Last creatinine 0.39. Last hemoglobin 7.9.   CONSULTANTS DURING THE HOSPITAL COURSE: Included Dr. Mortimer Fries, critical care; Dr. Manuella Ghazi, neurology; Dr. Grayland Ormond, hematology; Dr.  Weber Cooks, psychiatry.   HOSPITAL COURSE PER PROBLEM LIST:  1. For the patient's acute on chronic respiratory failure, the patient was intubated from May 13th through May 16th, and she was extubated. She was treated for  chronic obstructive pulmonary disease exacerbation. Upon discharge, pulse oximetry 88% at rest on room air, therefore making her a candidate for 24/7 oxygen. Respiratory status much improved upon discharge. Lungs are clear upon discharge.  2. Chronic obstructive  pulmonary disease exacerbation with tobacco abuse. She is on nicotine patch. She was counseled during the hospital course. She is on a prednisone taper.  3. Septic shock with Pasteurella. Initially, she was given IV fluid hydration, aggressive antibiotics. When blood cultures came back Pasteurella, she was switched over to Cipro. She will continue a 2-week course for that.  4. Anemia. It looks like she was transfused 2 units while here. She is on iron supplementation. She did get IV iron by the hematologist.  5. Diabetes. She was kept off medications. Sugars have been in the normal range. Will continue to watch. Can check a fingerstick on a daily basis at the facility.  6. Hypertension. She is on numerous medications. Blood pressure is variable here, especially now that she is upset about going to assisted living and not going home. Continue medications as ordered.  7. Dementia with pseudobulbar affect with laughing. The patient was seen in consultation by neurology. They think it is secondary to the dementia. No real good management for this.  8. Leukocytosis secondary to sepsis.  9. Hypokalemia, which was replaced during the hospital course.   DISPOSITION: The patient is being discharged to assisted living in stable condition.   TIME SPENT ON DISCHARGE: 35 minutes.   FOLLOWUP: Close clinical followup outpatient with Dr. Benita Stabile. Recommend checking a BMP and CBC as outpatient with followup appointment.   ____________________________ Tana Conch. Leslye Peer, MD rjw:OSi D: 12/06/2012 11:53:00 ET T: 12/06/2012 12:02:13 ET JOB#: [id number]  cc: Sharlet Salina C. Hall Busing, MD Laurel Leslye Peer, MD, <Dictator>  Marisue Brooklyn MD ELECTRONICALLY SIGNED 12/09/2012 13:31

## 2014-11-09 NOTE — Consult Note (Signed)
PATIENT NAME:  Theresa Vasquez, Theresa Vasquez MR#:  [id number] DATE OF BIRTH:  May 06, 1931  DATE OF CONSULTATION:  08/30/2012  REFERRING PHYSICIAN:       CONSULTING PHYSICIAN:  Manya Silvas, MD  REASON FOR CONSULTATION:  anemia.  HISTORY OF PRESENT ILLNESS:   The patient is a 81 year old white female known to me from past work-up who presented with weakness and fatigue to her primary Dr. Benita Stabile, was sent to the hospital and admitted to the hospital. I was asked to see her in consultation for anemia. The patient had history of diverticulosis.  He had a colonoscopy in 2009 showed diffuse left side diverticulosis. She was admitted in September 2013 with presentation consistent with colitis of the sigmoid and descending colon with rectal bleeding and abdominal pain.  The decision was made at that time not to repeat her colonoscopy because of the CAT scan strongly suggesting either ischemic spell or food poisoning.   She has had progressive fatigue and weakness over the last few weeks and found to have significant anemia and was sent to the hospital.   PAST MEDICAL HISTORY: 1.  Diverticulosis with diverticulitis.  2.  Depression. 3.  Chronic iron deficiency anemia.  4.  Hyperlipidemia.  5.  Hypertension. 6.  Diabetes type 2.  7.  Chronic left bundle branch block.  8.  Partial hysterectomy.  9.  Osteoarthritis. 10.  Mild dementia.  The patient has had 4 appointments to our office and has canceled all 4 over the last several months.   MEDICATIONS: Alendronate 70 mg a week. Enalapril 10 mg a day.  Fluoxetine 40 mg a day. Gabapentin 300 mg b.i.d. Glipizide 2.5 mg daily. Hydralazine 25 mg b.i.d. Protonix 40 mg daily. Simvastatin 40 mg at bedtime. Xanax 1 mg p.o. t.i.d.   HABITS: Smokes a pack a day. Continues to smoke.   FAMILY HISTORY: According to the daughter, no family history of colon cancer.  REVIEW OF SYSTEMS:  The patient is now intubated and is there is no one here to give a review of  systems.  The review of systems give to the admitting doctor was reviewed and was positive for fatigue, generalized weakness, chronic anemia, easy bruising.   PHYSICAL EXAMINATION: VITAL SIGNS: Blood pressure 124/60, pulse 108 with wide complexes on the monitor.  HEENT: Sclerae anicteric. Conjunctivae pale.  HEAD: Atraumatic.  NECK: Neck shows trachea in the midline.  CHEST: Clear on the right.  There is decreased air flow in the left lung.  HEART: Shows a 2/6 systolic murmur. Some irregular heartbeats.  ABDOMEN: Soft. There is no involuntary guarding. No palpable masses. No bruits. Bowel sounds are significantly diminished.  SKIN: Warm and dry.   LABORATORY AND DIAGNOSTIC DATA:   Glucose 164, BUN 14, creatinine 0.84, sodium 137, potassium 3.5, chloride 103, CO2 26, calcium 7.9. Initial hemoglobin was 5.4 with a very low MCV and MCH. Platelet count 436. White count was 6.3.  After transfusion she was 7.9, white count 13.4, platelet count 446. Urinalysis unremarkable. The patient had respiratory compromise about 7:30 last night with pH 7.08, pCO2 82, pO2 117. She was intubated and moved to the intensive care unit.  Chest x-ray shows findings consistent with diffusely increased interstitial prominence, especially in the right upper lobe, possible alveolar component, correlate  for underlying congestive heart failure.   ASSESSMENT: The patient appears to have developed respiratory failure after transfusions. There is phenomenon of  white blood cell idiosyncratic reaction rarely with transfusions. I have only seen  this twice before and it produces coughing in capillaries and acute pulmonary edema. It is possible the patient this; it is possible she has a simple volume overload which is made worse by her left bundle branch block.   PLAN: I would give her Protonix IV. Eventually when she recovers from this, would recommend a colonoscopy and an upper endoscopy in the hospital before she goes home as she has  dementia and does not keep her appointments. I will follow with you.     ____________________________ Manya Silvas, MD rte:ct D: 08/30/2012 06:31:05 ET T: 08/30/2012 07:08:50 ET JOB#: [id number]  cc: Manya Silvas, MD, <Dictator> Shreyang H. Posey Pronto, MD Manya Silvas MD ELECTRONICALLY SIGNED 09/08/2012 7:58

## 2014-11-09 NOTE — Consult Note (Signed)
PATIENT NAME:  Theresa Vasquez, Theresa Vasquez MR#:  [id number] DATE OF BIRTH:  01-Feb-1931  DATE OF CONSULTATION:  12/02/2012  REFERRING PHYSICIAN:  Mariane Duval, MD CONSULTING PHYSICIAN:  Hemang K. Manuella Ghazi, MD  REASON FOR CONSULTATION: Excessive laughing after extubation.   HISTORY OF PRESENT ILLNESS: Theresa Vasquez is an 81 year old right-handed Caucasian female who was actually admitted on 11/29/2012 for a COPD exacerbation.   Theresa Vasquez received care in the ICU and Theresa Vasquez was doing really well on May 15 and was extubated. Soon after extubation, the patient was noticed to have excessive uncontrollable laughing for at least 10 hours in a row and occasional crying spells, which were not associated with emotion of happiness or feeling good for the laughing and emotion of sadness with the crying.   Now, Theresa Vasquez has laughter only when Theresa Vasquez is talking to somebody and Theresa Vasquez is not aware that Theresa Vasquez is laughing excessively.   I talked to the patient's daughter on the phone who mentioned that patient has been having these symptoms since the last 6 months or so. Initially it was much less frequent, but now it is getting much more frequent.   The patient denied any history suggestive of Leta Baptist disease or multiple sclerosis or parkinsonism.   The patient has had not have any significant head trauma. There is no new medication.   The patient's daughter mentioned that Theresa Vasquez does have a history of multiple "mini strokes."   The patient received a CT scan of the head which showed bilateral frontal subcortical white matter microvascular ischemic changes. Otherwise it was unremarkable.   PAST MEDICAL HISTORY: Significant for diabetes, hypertension, hyperlipidemia, history of COPD and ongoing tobacco abuse, history of chronic iron deficiency anemia and dementia.   PAST SURGICAL HISTORY: Negative.   SOCIAL HISTORY: Significant in that Theresa Vasquez smokes about a pack per day. Has been smoking for last 40 to 50 years. No alcohol abuse. No illicit  drug abuse. Lives at home with her daughter.   FAMILY HISTORY: Significant in that both mother and father had diabetes. Mother had died from complications of diabetes.   MEDICATIONS: I reviewed her home medication and ICU medication list.   REVIEW OF SYSTEMS: Positive for breathing difficulty. Theresa Vasquez did not have any concerns regarding her excessive laughing. Other 10-system review of systems was asked and was found to be negative.   PHYSICAL EXAMINATION:   VITAL SIGNS: Temperature is 98.4, pulse 68, respiratory rate 19, blood pressure 143/54, pulse ox 95% on 2 liters of oxygen.  GENERAL: Theresa Vasquez is an elderly-looking Caucasian female lying in bed, not in acute distress. Has nasal cannula on, wearing glasses, was eating.  LUNGS: Lots of rhonchi and crackles.  HEART: S1, S2 heart sounds. Carotid exam did not reveal any bruit.  SKIN: Theresa Vasquez has multiple skin lesions on her body.  NEUROLOGIC: Mental status: Theresa Vasquez was alert. Theresa Vasquez was not oriented to time, place and person. Theresa Vasquez thought that the current President is Bush. Her immediate recall was 2 out of 3. delayed recall was zero out of 3. When I asked her to count the months of the year backward, Theresa Vasquez said December, November, October, September, August, and Theresa Vasquez skipped multiple months and said March, February, January.   The patient laughed in between every single question I asked. The patient did not seem to be actually happy or having fun that Theresa Vasquez answered.   The patient said her mood is okay as usual.   Theresa Vasquez denied any active hallucinations or delusions.  Her attention and concentration seems to be appropriate. I do not think Theresa Vasquez has any language deficit. Theresa Vasquez followed 2-step inverted commands. I do not think Theresa Vasquez has any neurological neglect.   On her cranial nerves, her pupils are equal, round and reactive. Extraocular movements were saccadic. Her face was symmetric. Tongue was midline. Facial sensations were intact. Her hearing is decreased bilaterally.    On her motor exam, Theresa Vasquez has normal tone and strength of 5 out of 5. Theresa Vasquez has mild give-away weakness of her left leg.   Her deep tendon reflexes were symmetric. Her sensations were intact to light touch. I did not check her gait.   ASSESSMENT AND PLAN:  1.  Acute onset of excessive laughing actually might represent just continuation of her symptoms that  have been going on for the last 6 months. Per daughter, initially they were very episodic and now it has gotten much more frequent. Recent stress of intubation and extubation might have made it worse.  2.  This can represent symptom complex called pseudobulbar affect. This condition can be seen in multiple neurological diseases.   This is not a disease-specific symptom. It is usually seen in a patient who has bilateral injury of corticobulbar fibers of any lesion such as multiple sclerosis or amyotrophic lateral sclerosis or multiple stroke.   I think in this patient the cause is significant white matter microvascular ischemic changes in her bifrontal region.   I would like to obtain MRI of the brain to make sure Theresa Vasquez does not have any new acute ischemic lesion.   Gelastic epilepsy can be another differential diagnosis, but that is usually seen in younger population where the person  has uncontrollable laughter followed by a seizure, but they do not have continuous laughing like this.   This does not seem to be psychiatric in origin from my perspective, but will greatly appreciate psychiatry thoughts as well if they happen to see the patient.   Nuedexta, which is a combination of dextromethorphan and quinidine, seems to help this kind of condition but it takes weeks to improve. It typically starts at 1 capsule once a day for 7 days and then 1 capsule twice a day.   Quinidine just increases the dose of dextromethorphan, which is easily available medication. I tried to look for it in the hospital formulary, but I cannot find it. Again, the  name  of the medicine is Nuedexta.   We believe it might work through an [place] receptor.  Feel free to contact me with any further questions. I will follow this patient in the hospital if necessary.   ____________________________ Royetta Crochet. Manuella Ghazi, MD hks:jm D: 12/02/2012 13:59:40 ET T: 12/02/2012 14:26:52 ET JOB#: [id number]  cc: Hemang K. Manuella Ghazi, MD, <Dictator> Primary Care Physician Royetta Crochet [facility] MD ELECTRONICALLY SIGNED 12/09/2012 16:45

## 2014-11-09 NOTE — Consult Note (Signed)
Pt CC was iron def anemia.  Pt with hgb up to 8.4, plt 410, WBC 15.7, CXR improved without major problems.  Still on 4L nasal canula.  Will see Monday.  Dr Dionne Milo on call over weekend.   Electronic Signatures: Manya Silvas (MD)  (Signed on 14-Feb-14 16:50)  Authored  Last Updated: 14-Feb-14 16:50 by Manya Silvas (MD)

## 2014-11-09 NOTE — Consult Note (Signed)
Psychiatry: Consultation on this patient in the critical care unit who is having symptoms of uncontrolled laughter. Information obtained from the patient and her son and also from the chart. patient is having uncontrolled spells of laughter. It appears to be happening almost continuously now. When she is not talking she seems to be able to stop laughing but once she starts talking the laughter interrupts her speech continuously. According to the note from Dr. Manuella Ghazi, the children report that this symptom had actually been coming on for some months prior to this hospitalization. The patient herself can't tell me how long it has been going on exactly. She is aware that it is getting worse. She tells me that she is not feeling happy and does not find anything funny when she is laughing. She feels out of control of this symptom. He actually is starting to cause physical discomfort. She is having discomfort in her chest wall from laughing continuously. has no prior psychiatric history at all. Both she and her son denied that she's had any problems with depression or other mood disorders or any psychiatric treatment in the past. exam today the patient was awake and alert and willing to interact with me. She made good eye contact. She made appropriate efforts to answer questions and engage in conversation she laughed almost continuously while she was talking with me. She tells me that it is uncomfortable and that she is not feeling like anything is funny. She denies any specific mood symptoms. She is not reporting any psychotic symptoms no hallucinations no thought disorder. I did not do specific cognitive testing but I think there are signs that she has short-term memory problems. During our conversation there were a couple of occasions on which I explained something to her and then when I came back to the same point 2 or 3 minutes later she clearly didn't remember anything I had told her before. reading Dr. Trena Platt note  I suspected that I would agree with him completely and I do. This appears to be a classic case of pseudobulbar  affect. In my experience it is relatively uncommon to see it as clearly as we do in this patient. This is a neurologic symptom. I do not have any personal experience of trying to treat this specific symptom. I am aware of the medicine that Dr. Manuella Ghazi mentions,Nuedexta, but have never used it. I have read the prescribing information. The medicine is a combination of dextromethorphan and quinidine. The quinidine appears to be essential for the medication to work without excessive side effects. At least that is what the manufacturer would lead one to believe. The side effects listed on the website appear to be fairly modest and probably if that were the only concern it would be worth the risk to try the medicine if this symptom persists. One other concern however is that the medicine is extremely expensive, about $700 a month. I have no idea how easy it would be to get her Medicare plan to pay for it. I do not know of any other specific treatment for this symptom. I think there is probably some chance that this symptom may go away on its own with time although there is also a chance that it will persist or even worse n. expressed my sympathy for the patient and explained all of this to her and to her son. I doubt that she remembers any of it but I think her son understood it very clearly. I will leave it to  the primary treatment team and perhaps to Dr. Manuella Ghazi to make recommendations for further treatment. Thank you for allowing me the opportunity to meet this patient.    Electronic Signatures: Clapacs, Madie Reno (MD)  (Signed on 16-May-14 20:46)  Authored  Last Updated: 16-May-14 20:46 by Gonzella Lex (MD)

## 2014-11-09 NOTE — H&P (Signed)
PATIENT NAME:  Theresa Vasquez, Theresa Vasquez MR#:  [id number] DATE OF BIRTH:  [birth year]  DATE OF ADMISSION:  11/29/2012  PRIMARY CARE PHYSICIAN: Dr. Benita Stabile.   CHIEF COMPLAINT: Shortness of breath.   HISTORY OF PRESENT ILLNESS: This is an 81 year old female who presents to the Emergency Room due to onset of worsening shortness of breath. The patient has underlying COPD with ongoing tobacco abuse, is somewhat short of breath all the time, but her breathing had gotten much worse this morning and she was urgently brought to the ER. In the ER the patient was noted to be in worsening respiratory failure and urgently intubated.   Most of the history was obtained from the family at bedside. As per the family, the patient has baseline dementia. She has nonspecific complaints all the time, although did not notice that she was complaining of any chest pain, any abdominal pain, any nausea, vomiting, any fevers, chills, or any other associated symptoms. As per the son, the patient usually likes to stay up late at night, but went to bed a bit early yesterday, which is unusual for her. This morning when she woke up she was having worsening shortness of breath and brought to the ER and was urgently intubated.   REVIEW OF SYSTEMS: Otherwise unobtainable given the patient's mental status as she is sedated and intubated.   PAST MEDICAL HISTORY: Consistent with diabetes, hypertension, hyperlipidemia, history of COPD with ongoing tobacco abuse, history of chronic iron deficiency anemia, dementia.   ALLERGIES: No known drug allergies.   SOCIAL HISTORY: Still smokes about a pack per day. Has been smoking for the past 40 to  50 years. No alcohol abuse. No illicit drug abuse. Lives at home with her daughter.   FAMILY HISTORY: Both mother and father had diabetes. Mother died from complications of diabetes.   PATIENT'S CURRENT MEDICATIONS: Are as follows: Fosamax 70 mg weekly, aspirin  81 mg daily, Aricept 10 mg at bedtime,  enalapril 10 mg daily, iron sulfate 325 mg daily, fluoxetine 40 mg daily, gabapentin 300 mg b.i.d., glipizide 5 mg, one-half tablet in the evening, hydralazine 25 mg b.i.d., Lamictal 100 mg at bedtime, Protonix 40 mg daily, simvastatin 40 mg at bedtime, trazodone 50 mg at bedtime as needed, vitamin D3 1 tablet at lunch, and Xanax  0.5 mg b.i.d. as needed.   PHYSICAL EXAMINATION: On admission was as follows:   VITAL SIGNS: Are noted to be: Temperature is 100.6, pulse 74, respirations 14, blood pressure 81/50, sats 100% on the ventilator.  GENERAL: She is lethargic, sedated on a ventilator, critically ill-appearing.  HEAD, EYES, EARS, NOSE, THROAT EXAM: She is atraumatic, normocephalic. Her pupils are equal and reactive to light. Sclerae are anicteric. No conjunctival injection.  NECK: Supple. There is no jugular venous distention, no bruits, no lymphadenopathy or thyromegaly.  HEART EXAM: Is tachycardic, regular. No murmurs or rubs. No clicks.  LUNGS: She has positive use of accessory muscles. No dullness to percussion. Diffuse wheezing and rhonchi bilaterally.  ABDOMEN: Soft, flat, nontender, nondistended. Has good bowel sounds. No hepatosplenomegaly appreciated.  EXTREMITIES: No evidence of any cyanosis, clubbing, or peripheral edema; +2 pedal and radial pulses bilaterally.  NEUROLOGIC: She is sedated and intubated. Difficult to do a full neurological exam.  SKIN: Moist and warm, with no rashes.  LYMPHATIC: There is no cervical or axillary lymphadenopathy.   LABORATORY EXAM: Showed a serum glucose of 233, BUN 8, creatinine 0.7, sodium 133, potassium 3.5, chloride 98, bicarbonate 27.   LFTs are  within normal limits. Troponin less than 0.02. White cell count 27.5, hemoglobin 6.7, hematocrit 23.9, platelet count 488.   The patient did have a chest x-ray done which shows diffuse interstitial prominence, correlate for underlying interstitial lung disease and possibly of underlying diffuse edematous  versus non-edematous and interstitial infiltrates. The patient also had Dopplers of her lower extremities showing no evidence of any DVT in the left lower extremity.   ASSESSMENT AND PLAN: This is an 81 year old female with history of COPD with ongoing tobacco abuse, chronic iron-deficient anemia, dementia, hypertension, diabetes, hyperlipidemia, presents to the hospital for shortness of breath and noted to be in acute respiratory failure secondary to COPD exacerbation. She was also noted to have worsening anemia and noted to be in septic shock.   1.  Acute-on-chronic respiratory failure: This is likely secondary to COPD exacerbation. The patient has a long history of tobacco abuse. She was seen in ER urgently and intubated. For now I will treat her aggressively with IV steroids, place her on a Combivent inhaler, Flovent, placed her on empiric antibiotics with vanco, Zosyn and Zithromax. I will get a pulmonary consult to help with vent management and COPD management. Will follow serial ABGs and follow clinically.  2.  COPD exacerbation: Likely cause of her acute-on-chronic respiratory failure. Will treat her aggressively with IV steroids, around-the-clock nebulizer treatments, Combivent, Flovent, empiric antibiotics as mentioned above, follow serial ABGs, and follow her clinically.  3. Shock: This is likely septic shock. The patient presented with a low-grade fever and a leukocytosis and tachycardia. The source of the sepsis is unclear. Presently, her chest x-ray does not show any evidence of pneumonia. Her urinalysis is currently pending. For now I would empirically treat her with broad-spectrum antibiotics with vancomycin, Zosyn and Zithromax, follow her blood and urine cultures, follow her hemodynamics. Will start her on Levophed to keep mean arterial pressures greater than 60 for now.  4.  Anemia: This is chronic for the patient. She has chronic iron deficiency anemia and is on iron supplements. She has  been worked up from a GI standpoint with a recent colonoscopy and endoscopy done in March of this year, which only showed some mild esophagitis and internal hemorrhoids. For now will transfuse her 1 unit of packed red blood cells and follow hemoglobin. She may need a hematology/oncology consult to further work up her anemia.  5.  Diabetes: Since she is going to be n.p.o., I will place her on sliding scale insulin for now.  6.  Hypertension: Hold all antihypertensives as the patient is currently in septic shock.  7. Dementia: Continue Aricept.   The patient is a FULL CODE. The plan was discussed with the patient's family, and they are in agreement.   Critical care time spent on admission is 55 minutes.    ____________________________ Belia Heman. Verdell Carmine, MD vjs:dm D: 11/29/2012 10:08:56 ET T: 11/29/2012 10:25:45 ET JOB#: [id number]  cc: Belia Heman. Verdell Carmine, MD, <Dictator> Henreitta Leber MD ELECTRONICALLY SIGNED 12/06/2012 73:41

## 2014-11-09 NOTE — Consult Note (Signed)
Brief Consult Note: Diagnosis: IDA  SEPTIC SHOCK RECOVERING  COPD  DEMENTIA.   Patient was seen by consultant.   Comments: DICTATED NOTE TO FOLLOW  CURRENTLY NO ACUTE DISTESS, IS NOW EXTUBATED AND VSS  DENIES PAIN, POOR MEMORY FOR RECENT AND PAST EVENTS, ALERT AND COOPERRATIVE, PALLOR, RHONCHI , NO WHEEZING, ABDOMEN BENIGN,   HX O CHRONIC IRON DEFICIENY FROM CHART AND PATIENT ALSO REMEMBERS, UNCLEAR RE PRIOR DOCUMENTATION. IN 08/2012 GI BLEEDI, IDA, IN 09/2012 EGD AND COLON DONE, HGB IN FEB AS LOW AS 4.7, TRANSFUSED TO 8.2, THIS ADMISSION WAS 6.7, TRANSFUSED  TO 7.2.  HAS NOT BEEN ON PO IRON.  SUGGEST  PO IRON BID. IF HGB NOT STEADILY BETTER, OR IF THERE IS HX THAT PATIENT PRIOR TRIED AND FAILED PO IRON , THEN GIVE ONE DOSE FERAHEME 510 MG IV.  Electronic Signatures: Dallas Schimke (MD)  (Signed 15-May-14 20:32)  Authored: Brief Consult Note   Last Updated: 15-May-14 20:32 by Dallas Schimke (MD)

## 2014-11-09 NOTE — H&P (Signed)
PATIENT NAME:  Theresa Vasquez, Theresa Vasquez MR#:  [id number] DATE OF BIRTH:  18-Apr-1931  DATE OF ADMISSION:  08/29/2012  PRIMARY DOCTOR: Dr. Hall Busing.  Emergency Department referring physician: Dr. Lenise Arena.   CHIEF COMPLAINT: Patient presents with generalized weakness, fatigue, and also told by Dr. Juanell Fairly office that she had a significantly abnormal hemoglobin.   HISTORY OF PRESENT ILLNESS: The patient is a 81 year old white female with past medical history significant for diverticulitis, history of colitis in the past, who was admitted in September with a similar type of presentation of colitis, then again in April with complaint of rectal bleeding as well as abdominal pain. At that time, she was told that she may need a colonoscopy. She has had a colonoscopy in the past; but since she was approaching age 77, they told her that she would not need the colonoscopy. She has been doing okay but has had progressive weakness and fatigue over the past few weeks. The patient was seen by her primary care provider on Friday and had some blood work done. Today, she was told that her hemoglobin was very low and came to the ED.  In the ED, the ED physician did do a guaiac of her stool, which was negative. The patient otherwise denies any changes in her weight. Denies any chest pain, shortness of breath. Denies any hematemesis or hematochezia. Denies any hematuria.   PAST MEDICAL HISTORY:  1.  Recurrent colitis x2.  2.  History of diverticulitis.  3.  History of depression.  4.  Has chronic anemia with hemoglobin around 10.4 during her previous hospitalization at discharge. Subsequently, Dr. Hall Busing checked her hemoglobin approximately six months ago, according to her daughter, and it was in the eight range.  5.  Hyperlipidemia.  6.  Hypertension.  7.  Diabetes type 2.  8.  History of chronic left bundle branch block.  9.  Status post partial hysterectomy.  10.  Osteoarthritis.  11.  Mild dementia, which she sees  Dr. Manuella Ghazi.   ALLERGIES: None.   CURRENT MEDICATIONS AT HOME: She is on alendronate 70 mg weekly, (Dictation Anomaly) 10  at bedtime; enalapril 10 daily; fluoxetine 40 daily; gabapentin 300 one tab p.o. b.i.d.; glipizide 2.5 daily; hydralazine 25 one tab p.o. b.i.d.; Protonix 40 daily; simvastatin 40 mg at bedtime; Xanax 1 mg p.o. t.i.d.   SOCIAL HISTORY: Continues to smoke 1 pack per day. Denies any alcohol or drug use.   FAMILY HISTORY: According to her daughter, there is no family history of colon cancer   review of systems:  CONSTITUTIONAL: Complains of no fevers. Complains of fatigue, generalized weakness. No pain. No weight loss. No weight gain.  EYES: No blurred or double vision. No redness. No inflammation.  EARS, NOSE, AND THROAT: No tinnitus. No ear pain. No hearing loss. No difficulty with swallowing.  RESPIRATORY: Denies any cough, wheezing, hemoptysis. No history of COPD.  CARDIOVASCULAR: Denies any chest pain, orthopnea, or edema or arrhythmia. No palpitations. No syncope.  GASTROINTESTINAL: No nausea, vomiting, diarrhea. No abdominal pain. No hematemesis. No melena. No IBS. No jaundice. No rectal bleeding.  GENITOURINARY: Denies any dysuria, hematuria, renal calculus, or frequency.  EndoCRINOLOGIC: Denies any polydipsia, nocturia, or thyroid problems.  HEME/LYMPH: Has history of chronic anemia.  Has a history of easy bruising. No easy bleeding. No swollen glands.  SKIN: No acne. Has some healed bruising on her skin superficially from bumping into things.  MUSCULOSKELETAL: Denies any pain in the neck. Does have arthritis-related pain. No gout.  NEUROLOGIC: No CVA. No TIA. No seizures.  PSYCHIATRIC: No anxiety. No insomnia. No ADD.   PHYSICAL EXAMINATION:  VITAL SIGNS: Temperature 99.6, pulse 76, respirations 18, blood pressure 132/61.  GENERAL: The patient is an elderly white female in no acute distress.  HEAD, EYES, EARS, NOSE, AND THROAT: Pupils are equal, round, reactive to  light and accommodation. There is conjunctival pallor. No scleral icterus. Nasal exam shows no drainage or ulceration. Oropharynx is clear without any exudate.  NECK: No thyromegaly. No carotid bruits.  CARDIOVASCULAR: Regular rate and rhythm. No murmurs, rubs, clicks, or gallops. PMI is not displaced.  LUNGS: Clear to auscultation bilaterally without any rales, rhonchi, or wheezing.  ABDOMEN: Soft, nontender, nondistended. Positive bowel sounds x4.  EXTREMITIES: No clubbing, cyanosis, or edema.  SKIN: No rash.  LYMPHATICS: No lymph nodes palpable.  VASCULAR: Good DP, PT pulses.  PSYCHIATRIC: Not anxious or depressed.   RECTAL: Exam done per the ED physician shows guaiac-negative stools.   EvaluationS: WBC count of 6.3, hemoglobin 5.4, platelet count is 436. Her BMP: Glucose 96, BUN 9, creatinine 0.57, sodium 138, potassium 4.0. LFTs showed albumin of 3.3. Troponin less than 0.02   ASSESSMENT AND PLAN: The patient is a 81 year old white female with a history of anemia, hemoglobin around 10 last year and then six months ago it was around 8, now 5.   1.  Symptomatic anemia, likely due to severe iron deficiency: Iron studies currently pending. Guaiac studies are negative. At this time, the patient has been consented to receive 2 units of packed red blood cells. The patient will need Gastroenterology evaluation for iron deficiency anemia. We will ask them to come see the patient.  Will likely need outpatient esophagogastroduodenoscopy and a colonoscopy.  2.  Hypertension: At this time, I will continue enalapril due to her blood pressure being borderline.  We will hold the hydralazine.  3.  Diabetes: We will place her on sliding scale insulin as well as continue the low-dose glipizide.  4.  Hyperlipidemia: We will continue simvastatin.  5.  Chronic left bundle branch block, which is unchanged.  6.  We will hold off on any deep vein thrombosis prophylaxis in light of a possible gastrointestinal blood  loss. Will do sequential compression devices.  7.  Please note the patient has been consented for a blood transfusion.  Risk and benefits explained and she is agreeable to the transfusion.   Note, 40 minutes spent.     ____________________________ Lafonda Mosses. Posey Pronto, MD shp:th D: 08/29/2012 16:55:37 ET T: 08/29/2012 18:49:04 ET JOB#: [id number]  cc: Princeston Blizzard H. Posey Pronto, MD, <Dictator> Alric Seton MD ELECTRONICALLY SIGNED 08/30/2012 10:23

## 2014-11-09 NOTE — Consult Note (Signed)
Chief Complaint:  Subjective/Chief Complaint Overall same. No new complaints. H and H stable at 8.2 (was 8.4 yesterday). No signs of active bleeding.  Impression: Iron deficiency anemia. H and H stable.  Recommendations: EGD and colonoscopy when respiratory status is more stable. Will follow.   Electronic Signatures: Jill Side (MD)  (Signed 15-Feb-14 12:12)  Authored: Chief Complaint   Last Updated: 15-Feb-14 12:12 by Jill Side (MD)

## 2014-11-09 NOTE — Discharge Summary (Signed)
PATIENT NAME:  Theresa Vasquez, Theresa Vasquez MR#:  [id number] DATE OF BIRTH:  [birth year]  DATE OF ADMISSION:  08/29/2012 DATE OF DISCHARGE:  09/05/2012  For a detailed note, please take a look at the history and physical done on admission by Dr. Dustin Flock.  DISCHARGE DIAGNOSES: 1. Acute lower gastrointestinal bleed, now resolved.  2. Acute respiratory failure secondary to acute transfusion-related lung injury, now resolved.  3. Anemia secondary to acute gastrointestinal bleed, also stable. 4. Diabetes. 5. Hyperlipidemia. 6. Dementia. 7. Acute diastolic congestive heart failure.   DIET: The patient is being discharged on an [employer] low sodium, low fat diet.   ACTIVITY: As tolerated.   DISCHARGE FOLLOWUP: In the next 1 to 2 weeks with Dr. Vira Agar.  DISCHARGE MEDICATIONS: 1. Enalapril 10 mg daily. 2. Fluoxetine 40 mg daily. 3. Gabapentin 300 mg b.i.d.  4. Hydralazine 25 mg b.i.d. 5. Xanax 1 mg t.i.d. as needed. 6. Simvastatin 40 mg at bedtime. 7. Glipizide 5 mg 1/2 tab daily. 8. Aricept 10 mg at bedtime. 9. Fosamax 70 mg weekly.  10. Protonix 40 mg b.i.d.    CONSULTANTS DURING HOSPITAL COURSE: Dr. Gaylyn Cheers from gastroenterology and Dr. Flora Lipps from pulmonary critical care.  PERTINENT STUDIES DONE DURING HOSPITAL COURSE: Chest x-ray done on admission showing findings consistent with diffuse increased interstitial prominence, especially in right upper lobe, mild cardiomegaly. Repeat chest x-ray done on February 10th showing increased density in right upper lobe consistent with right lobe pneumonia superimposed with diffuse interstitial edema. Chest x-ray done on February 14th showing COPD with chronic changes.   HOSPITAL COURSE: This is a 81 year old female with medical problems as mentioned above who presented to the hospital on 08/29/2012 secondary to anemia with a hemoglobin of 5.4.  1. Symptomatic anemia. This was severe iron deficiency anemia as the  patient's ferritin was noted to be 8. Her guaiac studies were negative. The patient has received a total of 3 units of packed red blood cells since being in the hospital. Het hemoglobin is stabilized close to around 8. She has had no evidence of any further acute GI bleeding. She likely would benefit from endoscopy and colonoscopy, but since she developed some transfusion related lung injury secondary to her blood transfusion this is to be done as an outpatient at a later point. The patient will continue proton pump inhibitor b.i.d. and follow up with Dr. Vira Agar as an outpatient.  2. Acute respiratory failure. This was likely secondary to a transfusion related acute lung injury. The patient apparently was intubated intermittently for 1 day and she self-extubated herself and has been on minimal oxygen since then and currently has been weaned off oxygen completely and is clinically doing very well. She has no wheezing, no bronchospasm. Initially there was some thought that she had some pneumonia and she was given some antibiotics and also some IV steroids, but those have since then been discontinued. Her echo also showed normal ejection fraction of 55%.  3. Suspected right upper lobe pneumonia. Again, this was ruled out because her respiratory failure was secondary to acute transfusion related lung injury. She is currently off antibiotics and clinically afebrile with a normal white cell count.  4. Hypotension. This was secondary to the anemia and GI bleed. It has since then improved and resolved with transfusion.  5. Diabetes. The patient has had no evidence of any acute hypoglycemic episodes. She will continue her glipizide as stated.  6. Hyperlipidemia. The patient was maintained on simvastatin. She will  resume that.  7. Dementia. The patient is on Aricept. She will also continue that.  8. Diabetic neuropathy. The patient will be maintained on her Neurontin.   The patient has been evaluated by physical  therapy and thought she would benefit from short-term rehab. She is therefore being discharged there presently. The patient is a FULL CODE.   TIME SPENT ON DISCHARGE: 40 minutes.  ____________________________ Belia Heman. Verdell Carmine, MD vjs:sb D: 09/05/2012 11:25:35 ET T: 09/05/2012 11:55:43 ET JOB#: [id number]  cc: Belia Heman. Verdell Carmine, MD, <Dictator> Manya Silvas, MD Henreitta Leber MD ELECTRONICALLY SIGNED 09/16/2012 1:00

## 2014-11-09 NOTE — Consult Note (Signed)
Routine consult received at 6:30 pm, will see tomorrow  Electronic Signatures: Manya Silvas (MD)  (Signed on 10-Feb-14 19:11)  Authored  Last Updated: 10-Feb-14 19:11 by Manya Silvas (MD)

## 2014-11-09 NOTE — Consult Note (Signed)
CC: severe iron def anemia.  Dr. Mortimer Fries thinks may have been TRALI after a unit of blood, acute vol overload possible but less likely.  Still with decreased breath sounds in right upper lung area, as seen on CXR.  I wonder about acute aspiration producing the problems she had 2 nites ago.  I talked to patient and son, the day or two before she leaves the hospital I would like to do EGD and colon if her pulmonary status is good enough for the prep and procedures.  If she goes to rehab it can be done after further improvement before returning home.  She is very non compliant with office visits and cancelled 4 office visits, may be dementia also.  Will follow  Electronic Signatures: Manya Silvas (MD)  (Signed on 12-Feb-14 09:57)  Authored  Last Updated: 12-Feb-14 09:57 by Manya Silvas (MD)

## 2014-11-10 NOTE — Discharge Summary (Signed)
PATIENT NAME:  Theresa, Vasquez MR#:  [id number] DATE OF BIRTH:  Jan 17, 1931  DATE OF ADMISSION:  05/11/2014 DATE OF DISCHARGE:  05/17/2014  For a detailed note, please take a look at the history and physical done on admission by Dr. Valentino Nose.   DISCHARGE DIAGNOSES: 1.  Acute respiratory failure secondary to a combination of chronic obstructive pulmonary disease exacerbation and mild congestive heart failure. 2.  Chronic obstructive pulmonary disease exacerbation.  3.  Acute on chronic diastolic congestive heart failure. 4.  Chronic atrial fibrillation. 5.  Chronic microcytic anemia, iron deficient. 6.  Diabetes. 7.  Dementia.  8.  Gastric ulcers.  DISCHARGE DIET: This patient is being discharged on a low-sodium, low-fat, American Diabetic Association diet.   DISCHARGE ACTIVITY: As tolerated.   DISCHARGE FOLLOWUP: Follow-up is with Dr. Golden Pop in the next 1 to 2 weeks, followup with Dr. Serafina Royals in 1 to 2 weeks, and followup with Dr. Dorise Bullion from gastroenterology in the next 2 weeks.  DISCHARGE MEDICATIONS: Enalapril 10 mg daily, Spiriva 1 puff daily, gabapentin 400 mg t.i.d., Depakote 250 mg t.i.d., glipizide 5 mg daily, metformin 500 mg 1/2 tab b.i.d., Effexor extended-release 37.5 mg capsule 1 cap daily, Aricept 10 mg at bedtime, Tylenol 500 mg q. 6 hours as needed, milk of magnesia 30 mL daily as needed, lorazepam 0.5 mg daily as needed, DuoNebs 4 times daily as needed, Advair 250/50 one puff b.i.d., vitamin D3 1000 international units daily, tramadol 50 mg q. 6 hours as needed, lorazepam 0.5 mg b.i.d., prednisone 10 mg tablet taper starting at 20 mg down to 10 mg over the next 2 days, Cardizem CD 180 mg daily, Lasix 20 mg daily, Protonix 40 mg b.i.d., metoprolol tartrate 100 mg b.i.d.   CONSULTANTS DURING THE HOSPITAL COURSE: Dr. Dorise Bullion from gastroenterology, Dr. Serafina Royals from cardiology, Dr. Raul Del from pulmonary.   DIAGNOSTIC DATA: Pertinent studies done  during the hospital course: Chest x-ray done on admission showed cardiomegaly with pulmonary vascular congestion.   A 2-dimensional echocardiogram done showed ejection fraction of 45% to 50%. Mildly decreased global LV systolic function. Mildly dilated right and left atrium. Moderate mitral valve regurgitation. Moderate tricuspid regurgitation.   HOSPITAL COURSE: This is an 81 year old female with medical problems as mentioned above who presented to the hospital on May 11, 2014 due to shortness of breath and noted to be in rapid atrial fibrillation.  1.  Atrial fibrillation with rapid ventricular response. This was new onset for the patient. The patient was admitted to the intensive care unit, initially started on a Cardizem drip, maintained on that, eventually weaned off the Cardizem drip and started on oral metoprolol and Cardizem. Heart rates have been significantly well controlled on the oral meds. She is therefore going to be discharged on that. Her echocardiogram showed an EF of 45%. She is not a good candidate for long-term anticoagulation given the fact that she has a severe microcytic anemia, which is iron deficient and she is also a high fall risk. At this point, she is not being discharged on any anticoagulation.  2.  Acute hypoxic respiratory failure. This was secondary to a combination of COPD exacerbation and mild diastolic CHF. The patient was treated for both with diuresis with Lasix and also started on IV Solu-Medrol. The patient's clinical symptoms after treatment have improved. She has less wheezing and bronchospasm. She has now been weaned off the IV steroids, is finishing up a prednisone taper. She is also being discharged  on low-dose Lasix. She will continue beta blockers and ACE inhibitors for her CHF, as mentioned.  3.  Microcytic anemia. This is chronic for the patient. She has known iron deficient anemia. She follows with Dr. Grayland Ormond. She gets Feraheme treatments every few  weeks. The patient was given a Feraheme treatment while in the hospital prior to discharge. Her hemoglobin on the day of discharge is 8.8. She was also transfused a total of 1 unit of packed red blood cells while in the hospital. She was also seen by gastroenterology. They did an endoscopy on her which showed gastric ulcers, but no acute bleeding. At this point, she is being discharged on PPI b.i.d. with close follow-up with GI and her oncologist as an outpatient.  4.  Diabetes. The patient's blood sugars have remained stable. Her metformin was held in the hospital, although she will resume that along with her glipizide. She was maintained on sliding scale insulin while in the hospital. 5.  GERD. The patient is being discharged on a PPI b.i.d.  6.  Anxiety with dementia. The patient has been maintained on her Ativan, Aricept, Depakote and Effexor, which she will continue.   The patient was seen by physical therapy who thought she would benefit from short-term rehab; therefore, she is being discharged there presently. The patient is a FULL code.   TIME SPENT ON DISCHARGE: 40 minutes. ____________________________ Belia Heman. Verdell Carmine, MD vjs:sb D: 05/17/2014 14:33:10 ET T: 05/17/2014 15:17:55 ET JOB#: [id number]  cc: Belia Heman. Verdell Carmine, MD, <Dictator> Guadalupe Maple, MD Corey Skains, MD Arther Dames, MD Henreitta Leber MD ELECTRONICALLY SIGNED 05/28/2014 11:09

## 2014-11-10 NOTE — Consult Note (Signed)
PATIENT NAME:  Theresa Vasquez, Theresa Vasquez MR#:  [id number] DATE OF BIRTH:  01-24-1931  DATE OF CONSULTATION:  05/12/2014  REFERRING PHYSICIAN:  Dr. Lavetta Nielsen CONSULTING PHYSICIAN:  Corey Skains, MD  REASON FOR CONSULTATION: Acute on chronic diastolic dysfunction congestive heart failure, recurrent iron deficiency anemia, and new onset nonvalvular, nonrheumatic atrial fibrillation with left bundle branch block.   CHIEF COMPLAINT: "I'm short of breath."   HISTORY OF PRESENT ILLNESS: This is an 81 year old female with known diastolic dysfunction and congestive heart failure in the past, who has had significant recurrent evidence of anemia of unknown etiology, although iron deficiency has been suggested without evidence of bleeding complications and now having acute nonvalvular, nonrheumatic atrial fibrillation with rapid ventricular rate causing acute on chronic diastolic dysfunction congestive heart failure. The patient has a BNP of 4152, a chest x-ray with bilateral pulmonary edema, and an EKG showing atrial fibrillation with rapid ventricular rate and left bundle branch block unchanged from before. The patient has had a normal troponin without evidence of myocardial infarction, but is having chest pain with substernal radiating into her back, most consistent with her shortness of breath and hypoxia. The patient has had a diltiazem drip, but will need further treatment. The patient has had no evidence of chronic kidney disease, although has diabetes with complication in the past. Currently, she is slightly improved with her shortness of breath. The remainder of review of systems negative for vision change, ringing in the ears, hearing loss, cough, nausea, vomiting, diarrhea, bloody stools, stomach pain, extremity pain, leg weakness, cramping of the buttocks, known blood clots, headaches, blackouts, dizzy spells, nosebleeds, congestion, trouble swallowing, frequent urination, urination at night, muscle weakness,  numbness, anxiety, depression, skin lesions or skin rashes.   PAST MEDICAL HISTORY: 1.  Recurrent iron deficiency anemia.  2.  Diastolic dysfunction congestive heart failure.  3.  Diabetes with complication.   FAMILY HISTORY: No family members with early onset of cardiovascular disease or hypertension.   SOCIAL HISTORY: Currently denies alcohol or tobacco use.   ALLERGIES: As listed.   MEDICATIONS: As listed.   PHYSICAL EXAMINATION: VITAL SIGNS: Blood pressure is 110/68 bilaterally. Heart rate is 120 upright, reclining, and irregular.  GENERAL: She is a well-appearing patient in mild distress.  HEAD, EYES, EARS, NOSE, AND THROAT: No icterus, thyromegaly, ulcers, hemorrhage, or xanthelasma.  CARDIOVASCULAR: Irregularly irregular. Normal S1 and S2. Diffuse PMI. No apparent murmur, gallop, or rub. Carotid upstroke normal without bruit. Jugular venous pressure is normal.  LUNGS: Have bibasilar crackles and decreased breath sounds.  ABDOMEN: Soft, nontender. Cannot assess hepatosplenomegaly or masses due to increased abdominal girth.  EXTREMITIES: Show 2+ radial, trace femoral, no dorsalis pedis pulses with 1+ lower extremity edema. No cyanosis, clubbing or ulcers. There is some rash.  NEUROLOGIC: She is oriented to time, place, and person, with normal mood and affect.   ASSESSMENT: An 81 year old female with recurrent iron deficiency anemia with a low hemoglobin, diabetes with complication, new onset atrial fibrillation nonrheumatic or nonvalvular with rapid ventricular rate with acute on chronic diastolic dysfunction congestive heart failure.   RECOMMENDATIONS: 1.  Intravenous Lasix, watching for chronic kidney disease issues and improvement of pulmonary edema and lower extremity edema.  2.  Heart rate control of atrial fibrillation with diltiazem drip and oral metoprolol for diastolic dysfunction.  3.  Treatment of hypoxia with oxygenation and BiPAP.  4.  Possible repeat echocardiogram  for changes in LV dysfunction.  5.  Treatment of anemia with packed red blood cells  if necessary.  6.  Further treatment options after above.     ____________________________ Corey Skains, MD bjk:at D: 05/12/2014 08:46:17 ET T: 05/12/2014 12:43:49 ET JOB#: [id number]  cc: Corey Skains, MD, <Dictator> Corey Skains MD ELECTRONICALLY SIGNED 05/15/2014 8:41

## 2014-11-10 NOTE — H&P (Signed)
PATIENT NAME:  Theresa Vasquez, Theresa Vasquez MR#:  [id number] DATE OF BIRTH:  [birth year]  DATE OF ADMISSION:  01/22/2014  PRIMARY CARE PHYSICIAN: Dr. Benita Stabile   REFERRING PHYSICIAN: Dr. Beather Arbour  CHIEF COMPLAINT: Shortness of breath and hypoxia with altered mental status.   HISTORY OF PRESENT ILLNESS: The patient is an 81 year old Caucasian female with history of diabetes mellitus, hypertension, hyperlipidemia, chronic obstructive pulmonary disease, and congestive heart failure, who is residing at [facility], is found to be less responsive during their evening rounds. The patient was hypoxic at 70% and tachycardic at heart rate of 140s. EMS were called, and they have placed her on CPAP. The patient was brought in to the ED. In the ED, the patient was minimally responsive initially. She was given IV fluids, and chest x-ray has revealed right-sided pneumonia. Cultures were obtained and the patient is started on IV Zosyn, Levaquin and vancomycin for broad-spectrum coverage. The patient is septic, with elevated lactic acid. The patient is placed on BiPAP, as the patient was short of breath, and subsequently her pulse oximetry went up to 100%. The patient was also given Solu-Medrol IV, as she has a chronic history of chronic obstructive pulmonary disease, which was treated with underlying pneumonia. The patient's hemoglobin is low at 6.5. After discussing with the son, consent was taken by the ER physician, and 2 units of blood transfusion was ordered. Subsequently, the patient perked up, and became more awake and alert. During my examination, the patient is still uncomfortable with the BiPAP machine, but opening her eyes to verbal commands. Son is at bedside, and he is reporting that the patient got intubated two times recently, in the past several months.   PAST MEDICAL HISTORY: Chronic history of diabetes mellitus, hypertension, hyperlipidemia, history of chronic iron-deficiency anemia, dementia, chronic  obstructive pulmonary disease.   PAST SURGICAL HISTORY: None significant.   ALLERGIES: No known drug allergies.   PSYCHOSOCIAL HISTORY: Lives in [facility] Still continues to smoke, and she has been smoking for the past 40 to 50 years. According to old records, no alcohol abuse or illicit drug usage.   FAMILY HISTORY: Both parents had diabetes mellitus. Mother is deceased from complications of diabetes mellitus.   REVIEW OF SYSTEMS: Unobtainable, as the patient is with altered mental status and on BiPAP mask.   HOME MEDICATIONS: Xanax 0.5 mg p.o. b.i.d. vitamin D3 one tablet p.o. once a day, Spiriva 1 capsule inhalation once daily, Simvastatin 40 mg once daily, prednisone 10 mg in a tapering dose, pantoprazole 40 mg p.o. once daily, nicotine 21 mg topical patch to change once daily, hydralazine 25 mg p.o. b.i.d., lamotrigine 100 mg p.o. once a day, gabapentin 300 mg 1 capsule p.o. 2 times a day, fluticasone salmeterol 1 puff inhalation 2 times a day, iron sulfate 325 mg p.o. once a day, enalapril 10 mg 1 tablet p.o. once a day, donepezil 10 mg 1 tablet p.o. once daily, ciprofloxacin 500 mg p.o. b.i.d., alendronate 70 mg once a week, albuterol/ipratropium 1 puff inhalation 4 times a day.   PHYSICAL EXAMINATION: VITAL SIGNS: Temperature 99, pulse initially 142, during my examination it was in 80 to 100 range, respirations initially at 34, during my examination, respiratory rate is at 18 to 20, blood pressure 164/85. The patient is on BiPAP machine. Pulse oximetry is 98% on BiPAP.  GENERAL APPEARANCE: Not in acute distress. Moderately built and nourished. HEENT: Normocephalic, atraumatic. Pupils are equally reacting to light and accommodation. No scleral icterus. No conjunctival  injection. No sinus tenderness. On BiPAP respirations 16/80.  NECK: Supple. No JVD. No thyromegaly. Spontaneously moving her neck.  LUNGS: Right side with crackles, rales, decrease air entry. Left side of the lung is  with no wheezing. No accessory muscle usage. Moderate air entry.  GASTROINTESTINAL: Soft. Bowel sounds are positive in all four quadrants. Nontender, nondistended. No masses.  NEUROLOGIC: Arousable. The patient is lethargic, on BiPAP mask. Motor and sensory could not be elicited.  EXTREMITIES: 1+ pitting edema is present. No cyanosis. No clubbing.  SKIN: Warm to touch. Normal turgor. No rashes. No lesions.  MUSCULOSKELETAL: No joint effusion. No erythema.  PSYCHIATRIC: Mood and affect could not be elicited as the patient is with altered mental status.   LABORATORY AND IMAGING STUDIES: BNP is 1481, glucose 332, BUN and creatinine are normal. Sodium 133. Potassium and chloride are normal. CO2 of 22. GFR greater than 60. Anion gap is 13. Serum osmolality is normal. Calcium is 7.7, phosphorus 5.5, magnesium 2.0. LFTs: Albumin is low at 2.9. The rest of the LFTs are normal. AST is elevated at 41. Troponin is 0.02. WBC 20.3, hemoglobin 6.5, hematocrit is 23.7, platelets are 614. Neutrophils 74.  PT/INR are normal. Patient's ABG on nonrebreather: pH 7.26, pCO2 45, pO2 176 on 100% FiO2, with bicarbonate at 20.2, base excess -6.8. Lactic acid is at 6.3. Chest x-ray, portable, has revealed mild cardiomegaly and chronic interstitial changes with superimposed patchy airspace opacities throughout the right lung, which could reflect asymmetric edema or pneumonia. Recommended a follow-up chest radiograph to verify improvement. A 12-lead EKG: Wide-complex rhythm with left bundle branch block. Compared to the previous EKG, no significant EKG changes were noticed.   ASSESSMENT AND PLAN: An 81 year old female is sent from [facility] to the Emergency Department for altered mental status, shortness of breath, hypoxia and tachycardia.  1.  Sepsis with hypertension, tachycardia and elevated lactic acid. This is most likely secondary to right-sided patchy pneumonia, which is healthcare associated pneumonia. We will admit  the patient to the Intensive Care Unit, continue BiPAP, and cultures were obtained. Broad-spectrum antibiotics will be provided with Zosyn, vancomycin and levofloxacin.  2.  Acute respiratory distress secondary to right-sided pneumonia and acute exacerbation of chronic obstructive pulmonary disease. Solu-Medrol 125 mg IV was given in the Emergency Department. We will continue Solu-Medrol, tapering doses, and nebulizer treatments.  3.  Acute on chronic anemia. Hemoglobin 6.5. Will provide her 2 units of blood transfusion. We will provide her Lasix 20 mg IV after blood transfusion; 40 of Lasix IV was given by Dr. Beather Arbour in the Emergency Department. We will provide 20 mg of Lasix after blood transfusion.  4.  History of diabetes mellitus. We will provide her insulin sliding scale.  5.  Hyperlipidemia and hypertension. We are holding her home medications in view of altered mental status, as the patient is lethargic and blood pressure is at borderline.  6.  According to the son, the patient is a do not resuscitate.  7.  We will provide gastrointestinal and deep vein thrombosis prophylaxis.   CODE STATUS: The patient is full code. Son is her medical power of attorney.  TIME SPENT: 60 minutes.    ____________________________ Nicholes Mango, MD ag:cg D: 01/22/2014 04:53:29 ET T: 01/22/2014 06:42:14 ET JOB#: [id number]  cc: Nicholes Mango, MD, <Dictator> Leona Carry. Hall Busing, MD Nicholes Mango MD ELECTRONICALLY SIGNED 02/03/2014 2:14

## 2014-11-10 NOTE — Consult Note (Signed)
PATIENT NAME:  Theresa Vasquez, Theresa Vasquez MR#:  [id number] DATE OF BIRTH:  07/08/1931  DATE OF CONSULTATION:  01/22/2014  CONSULTING PHYSICIAN:  Arther Dames, MD  REFERRING PHYSICIAN: Dr. Nicholes Mango.   REASON FOR CONSULTATION: Anemia, positive FOBT.   HISTORY OF PRESENT ILLNESS: Theresa Vasquez is an 81 year old female with a past medical history notable for COPD, dementia, chronic anemia, diabetes, who was brought to the Emergency Room in the setting of being unresponsive and also being hypoxic and tachycardic. It appears that she was diagnosed with a pneumonia and was started on antibiotics. Per the chart notes, she has had several recent episodes of pneumonia in the past.   In the setting of being worked up, it was also noted that the patient had a hemoglobin of 6.5. She was given 2 units of packed red blood cells. The patient reported to me that she has had trouble with low blood counts in the past and does think that she is taking iron. She is unsure if she has had EGD or a colonoscopy in the past.   PAST MEDICAL HISTORY:  1. COPD.  2. Dementia.  3. Iron deficiency anemia.  4. Hypertension.  5. Diastolic murmur.  6. Hyperlipidemia.   ALLERGIES: NKDA.   SOCIAL HISTORY: She lives in [facility]. She denies alcohol or tobacco.   FAMILY HISTORY: No family history of GI malignancy that she is aware of.   REVIEW OF SYSTEMS: A 10-system review is conducted. It is negative except as stated in the HPI.   HOME MEDICATIONS:  1. Xanax 0.5 b.i.d.  2. Spiriva once daily. 3. Simvastatin 40 mg daily.  4. Prednisone 10 mg daily.  5. Pantoprazole 40 mg daily.  6. Hydralazine 25 mg b.i.d. 7. Lamotrigine 100 mg daily.  8. Gabapentin 300 mg b.i.d.  9. Fluticasone/salmeterol 1 puff 2 times a day. 10. Iron sulfate 325.  11. Enalapril 10 mg daily.  12. Donepezil 10 mg daily.  13. Ciprofloxacin 500 mg b.i.d.  14. Alendronate 70 mg weekly.  15. Ipratropium 4 times a day.   PHYSICAL EXAMINATION:   VITAL SIGNS: Her current temperature is 97.8, pulse is 93. Respirations are 18, blood pressure 120/58, pulse oximetry is 98% on 2 liters.  GENERAL: Alert and oriented times 3. Seems somewhat confused at times.  HEENT: Normocephalic/atraumatic. Extraocular movements are intact. Anicteric. NECK: Soft, supple. JVP appears normal. No adenopathy. CHEST: Clear to auscultation. No wheeze or crackle. Respirations unlabored. HEART: Regular. No murmur, rub, or gallop.  Normal S1 and S2. ABDOMEN: Soft, nontender, nondistended.  Normal active bowel sounds in all four quadrants.  No organomegaly. No masses EXTREMITIES: No swelling, well perfused. SKIN: No rash or lesion. Skin color, texture, turgor normal. NEUROLOGICAL: Grossly intact. PSYCHIATRIC: Normal tone and affect. MUSCULOSKELETAL: No joint swelling or erythema.   LABORATORY DATA: Her last sodium was 133, potassium 4.1, BUN 8, creatinine 0.86, calcium is 7.7, phosphate 5.5, magnesium 2. Her liver enzymes are normal except an albumin is 2.9. AST is mildly elevated at 41. Cardiac enzymes are normal. Her white count is 20. Her hemoglobin is 6.8. Her platelets are 614. Her INR is 1.1. Her FOBT is positive.   ASSESSMENT AND PLAN: Chronic anemia: She does appear to have a low hemoglobin on presentation. However, it is not clear to me that she has actually suffered any gastrointestinal blood loss. Unfortunately, she received blood before iron studies could be obtained although her MCV is 73. Given her age and severe comorbidities, including dementia, chronic obstructive  pulmonary disease and now several recurrent admissions for pneumonia, I do not think the risks of subjecting her to anesthesia and putting her through these procedures would outweigh the benefits at this time. I would hold off on performing these procedures unless she were to develop evidence of an active gastrointestinal bleed or she developed a repetitive need for packed red blood cells.    RECOMMENDATIONS: Continue to monitor hemoglobin. Would start her on Protonix 40 mg b.i.d. She should have iron studies when she is significantly far out from her blood transfusion to see if this is a true iron deficiency anemia. We can also see her in the GI clinic on followup to again reconsider the possibility of endoscopy at that time.    ____________________________ Arther Dames, MD mr:lt D: 01/22/2014 18:57:27 ET T: 01/22/2014 23:17:07 ET JOB#: [id number]  cc: Arther Dames, MD, <Dictator> Mellody Life MD ELECTRONICALLY SIGNED 02/08/2014 10:20

## 2014-11-10 NOTE — Discharge Summary (Signed)
PATIENT NAME:  Theresa Vasquez, Theresa Vasquez MR#:  [id number] DATE OF BIRTH:  10/24/1930  DATE OF ADMISSION:  01/22/2014 DATE OF DISCHARGE:  01/26/2014  CONSULTANTS: Dr. Arther Dames from GI.   PRIMARY CARE PHYSICIAN: Dr. Benita Stabile.   CHIEF COMPLAINT: Shortness of breath, hypoxia.   DISCHARGE DIAGNOSES:  1. Acute on chronic respiratory failure due to pneumonia/congestive heart failure.  2. Severe sepsis on arrival, resolved.  3. Healthcare-associated pneumonia.  4. Acute on chronic diastolic congestive heart failure.  5. Acute on chronic anemia. Suspect blood loss, acute in nature with guaiac-positive stools and 2 units of blood transfused.  6. Diabetes.  7. Hypertension.  8. Dementia.  9. Chronic respiratory failure.  10. History of hyperlipidemia.  11. History of chronic iron deficiency anemia.  12. History of chronic obstructive pulmonary disease.   DISCHARGE MEDICATIONS:  1. Enalapril 10 mg once a day.  2. Advair 250/50 mcg 2 times a day 1 puff.  3. Spiriva 18 mcg 1 capsule inhaled once a day.  4. Hydralazine 25 mg 2 times a day.  5. Vitamin D3 at 1 capsule at lunch.  6. Omeprazole 20 mg once a day.  7. Gabapentin 400 mg 2 times a day.  8. Divalproex sodium 250 mg extended-release 3 times a day.  9. Glipizide 5 mg once a day.  10. Metformin 500 mg 1/2 tab 2 times a day.  11. Effexor 37.5 mg extended-release once a day.  12. Donepezil 10 mg once a day at bedtime.  13. Myapap 500 mg every 6 hours as needed for pain.  14. Milk of magnesia 8% at 30 mL once a day as needed for constipation.  15. Lorazepam 0.5 mg 2 times a day and once extra as needed.  16. Levaquin 750 mg 1 dose on the 12th and then stop.  17. Vantin 100 mg every 12 hours for 2 days then stop.  18. Prednisone 50 mg per day then taper by 10 mg until done in 5 days in a tapered fashion.  19. DuoNeb 3 mL 4 times a day as needed for wheezing.   She will be getting discharged back to assisted living facility with 24-hour  assist with home health, PT, and RN with 2 liters of oxygen via nasal cannula.   DIET: Low-sodium, low-fat, low-cholesterol ADA diet.   ACTIVITY: As tolerated.   FOLLOWUP: Please follow with PCP and cardiologist within 1 to 2 weeks. Please follow with GI within 1 to 2 weeks. If any further bleeding, worsening shortness of breath, fevers, or any other symptoms or issues, call your doctor right away.  CODE STATUS: The patient is a full code.   SIGNIFICANT LABORATORIES AND IMAGING: Initial BNP 1481, BUN 8, creatinine 0.86, sodium 133, troponins negative x 3. Initial white count of 20,000 with hemoglobin of 6.5, which was the lowest hemoglobin. The last hemoglobin is 8.8, last white count of 10. Blood cultures on arrival: No growth to date. Urine culture on arrival: No growth to date. UA did not suggest infection. Guaiac stool on 07/06. Initial ABG showed pH of 7.26, pCO2 of 45, pO2 of 176 on nonrebreather with lactic acid of 6.3.   Initial chest x-ray showed mild cardiomegaly and chronic interstitial changes with superimposed patchy airspace opacity throughout the right lung which could reflect asymmetrical edema or pneumonia.  HISTORY OF PRESENT ILLNESS AND HOSPITAL COURSE: For full details of H and P, please see the dictation on July 6th per Dr. Margaretmary Eddy, but briefly, this is an  81 year old with dementia, chronic respiratory failure, COPD, diastolic CHF who came in with hypoxemia with an O2 saturation of 70% tachycardic with a heart rate of 140s. She was started on a nonrebreather, then BiPAP for oxygenation and started on broad broad-spectrum antibiotics as she did have sepsis on arrival. She was thought to have pneumonia. She had severe lactic acidosis and hypoxemia and was and transferred to ICU and started on broad-spectrum antibiotics, including Zosyn, vancomycin, and Levaquin. She was also noted to have acute on chronic CHF, diastolic in nature as well as a right-sided pneumonia. Furthermore, she was  noted to have acute on chronic anemia and likely blood loss anemia and blood was ordered while on the ICU.  She was given Lasix prior to the blood.   In regards to her severe sepsis, which she had on arrival with hypotension, tachycardia, tachypnea, leukocytosis, and lactic acidosis this was likely secondary to a patchy right-sided pneumonia, which is healthcare associated given recent hospitalizations and intubations as well as diastolic CHF. She was started on broad-spectrum antibiotics and BiPAP and did well. At this point, she has had no fevers. White count is normalized. Sepsis has resolved. She also did get intermittent Lasix. At this point, she has no significant wheezing or rhonchi although did require high flow nasal cannula briefly, is back on her 2 liters of oxygen, and she is ambulating without drop in her oxygenation. Her breathing is back to her baseline. Her acute on chronic respiratory failure is resolved and her CHF currently is compensated. In regards to the acute on chronic anemia I suspected acute blood loss anemia as the patient did have a guaiac-positive stools as well as hemoglobin in the 6's . GI was consulted. She was started on PPI. However, at this point given the respiratory issues, GI recommended to follow him as an outpatient. We recommend following with GI in the GI clinic to discuss whether to go ahead with an EGD or colonoscopy once the respiratory issues have settled out. At this point the hemoglobin is up trending. Would recommend checking another hemoglobin in about a week or so. She was seen by PT and the recommendations are home health with 24-hour assist and she already lives at an assisted living facility.   PHYSICAL EXAMINATION:  VITAL SIGNS: On the day of discharge, temperature is 98.6, pulse rate 70, respiratory rate 18, blood pressure 159/73, oxygen saturation 100% on 4 liters but per staff, she was ambulated with PT and O2 saturations did not drop to less than 90 on  2 liters. Generally, the patient is awake, alert, oriented, confused but pleasant, cooperative, following commands. Normal S1, S2. The patient does have a murmur.  LUNGS: Clear, without significant wheezing or rhonchi.  ABDOMEN: Benign.  EXTREMITIES: No pitting edema.  Total time spent about 40 minutes.   CODE STATUS: The patient is full code.    ____________________________ Vivien Presto, MD sa:lt D: 01/26/2014 14:24:10 ET T: 01/26/2014 23:28:43 ET JOB#: [id number]  cc: Vivien Presto, MD, <Dictator> Leona Carry. Hall Busing, MD Arther Dames, MD Vivien Presto MD ELECTRONICALLY SIGNED 02/15/2014 14:57

## 2014-11-10 NOTE — H&P (Signed)
PATIENT NAME:  Theresa Vasquez, Theresa Vasquez MR#:  [id number] DATE OF BIRTH:  [birth year]  DATE OF ADMISSION:  05/11/2014  REFERRING PHYSICIAN: Francene Castle, MD   PRIMARY CARE PHYSICIAN: Golden Pop, MD   CHIEF COMPLAINT: Chest pain.   HISTORY OF PRESENT ILLNESS:  This is an 81 year old Caucasian female with a history of diastolic congestive heart failure, ejection fraction of 55%, iron deficiency anemia, hyperlipidemia, hypertension, type 2 diabetes mellitus, noninsulin requiring, uncomplicated, as well as COPD presenting with chest pain. Describes acute onset chest pain retrosternal location described as "pain" 6-7/10 in intensity with associated shortness of breath, nonradiating, no worsening or relieving factors. She denies any palpitations on presentation to the Emergency Department. She was found to be in atrial fibrillation, heart rate 150s. Of note, recently evaluated by hematology for an anemia work-up found to be iron deficient anemia. The recommendations include GI evaluation. The patent states that she had 1 episode of bright red blood per rectum a few weeks ago; however, states no continued symptoms and no stool changes. Currently chest pain free still in atrial fibrillation with rapid ventricular response, heart rate in the 130s.   REVIEW OF SYSTEMS:  CONSTITUTIONAL: Denies fever, fatigue. Positive for generalized weakness. EYES: No blurred vision, double vision or eye pain. EARS, NOSE AND THROAT: Denies tinnitus, ear pain or hearing loss.  RESPIRATORY: Denies cough, wheeze. Positive shortness of breath as described above.  CARDIOVASCULAR: Positive for chest pain as described. Denies any palpitations. Denies any edema or orthopnea.  GASTROINTESTINAL: Denies nausea, vomiting, diarrhea and abdominal pain.  GENITOURINARY: Denies dysuria, hematuria.  ENDOCRINE: Denies nocturia or thyroid problems.  HEMATOLOGIC AND LYMPHATIC: Denies easy bruising and bleeding.  SKIN: No rash or lesion.    MUSCULOSKELETAL: Denies pain in the neck, back, shoulders, knees, hips or arthritic symptoms.   NEUROLOGIC: Denies paralysis, paresthesias.   PSYCHIATRIC: Denies anxiety or depressive symptoms.    Otherwise full review of systems performed by me is negative.    PAST MEDICAL HISTORY: Congestive heart failure, diastolic rate, ejection fraction of 50%, iron deficiency anemia, hyperlipidemia, hypertension essential, type 2 diabetes, non-insulin-requiring, uncomplicated, as well as COPD, non-O2 dependent.   SOCIAL HISTORY: Tobacco abuse. No alcohol or drug use.   FAMILY HISTORY: Positive for diabetes.    ALLERGIES: No known drug allergies.   HOME MEDICATIONS: Include Mapap 500 mg p.o. q. 6 hours as needed for pain, Tramadol 50 mg p.o. q. 6 hours as needed for pain, enalapril 10 mg p.o. daily, milk of magnesia 30 mL p.o. daily for constipation, Divalproex 250 mg p.o. 3 times daily, gabapentin 40 mg p.o. b.i.d., lorazepam 0.5 mg daily as needed for anxiety, Effexor extended release 37.5 mg p.o. daily, glipizide 5 mg p.o. q. daily, metformin 5 mg 1/2 tablet p.o. b.i.d., Advair 250/50 mcg inhalation 1 puff b.i.d., DuoNeb treatment 3 mL 4 times daily as needed for shortness of breath, Tiotropium   18 mcg inhalation daily, donepezil 10 mg p.o. at bedtime, Prilosec 20 mg p.o. daily, hydralazine 25 mg p.o. b.i.d., vitamin D3 1,000 international units p.o. q. daily.   PHYSICAL EXAMINATION:  VITAL SIGNS: Temperature 98.9, heart rate 149, currently 120, respirations 20, blood pressure 117/53, saturating 94% on supplemental O2. Weight 92.5 kg, BMI of 32.  GENERAL: Well-nourished, well-developed Caucasian female, currently in minimal distress given atrial fibrillation.  HEAD: Normocephalic, atraumatic.  EYES: Pupils equal, round, reactive to light.  Extraocular muscles intact.  No scleral icterus.   MOUTH: Moist mucous membranes. Dentition intact. No abscess noted.  EARS, NOSE, AND THROAT: Clear without  exudates. No external lesions.   NECK: Supple. No thyromegaly. No nodules. No JVD.  PULMONARY: Decreased breath sounds throughout all lung fields secondary to respiratory effort; however, no wheezes, rales, or rhonchi. Respiratory effort poor.  CHEST: Nontender to palpation.  CARDIOVASCULAR: S1, S2, irregular rate, irregular tachycardic. No murmurs, rubs, or gallops. No edema. Pedal pulses 2+ bilaterally.  GASTROINTESTINAL: Soft, nontender, nondistended. No masses. Positive bowel sounds. No hepatosplenomegaly.  MUSCULOSKELETAL: No swelling, clubbing or edema. Range of motion full in all extremities.   NEUROLOGIC: Cranial nerves II through XII intact. No gross focal neurologic deficits. Sensation intact. Reflexes intact.  SKIN: No ulceration, rash or cyanosis.  Skin warm and dry, turgor intact.  PSYCHIATRIC: Mood and affect within normal limits.  Alert and oriented x 3. Insight and judgment intact.      LABORATORY DATA: EKG performed reveals atrial fibrillation with rapid ventricular response, heart rate 150s with left bundle branch block. Chest x-ray performed reveals cardiomegaly as well as mild pulmonary vascular congestion. Remainder of laboratory data: Sodium 134, potassium 4.5, chloride 101, bicarbonate 23, BUN 22, creatinine 0.9, glucose 154, albumin of 2.8. LFTs within normal limits. Troponin less than 0.02. WBC 13.1, hemoglobin 6.0, MCV of 65, RDW of 19.7, platelets of 442,000.   ASSESSMENT AND PLAN: An 81 year old Caucasian female with  history of diastolic congestive heart failure, ejection fraction 55%, as well as iron deficiency anemia, presenting with chest pain.  1. Atrial fibrillation with rapid ventricular response, new onset. Will admit to the Intensive Care Unit. Initiate Cardizem drip, which is already started in the Emergency Department. If needed for heart rate control bolus Cardizem 10 mg IV once and then increase Cardizem drip by 5 mg an hour with goal heart rate to be  consistently less than 120. Will also check transthoracic echocardiogram. Cardiac enzymes x 3 and continue to telemetry.  2. Retrosternal chest pain, likely demand given left ventricular response, as well as anemia. Initiate  aspirin and statin therapy. Would hold on heparin drip for both atrial fibrillation as well as chest pain if required given anemia and positive fecal occult blood test. We will consult cardiology for both chest pain and atrial fibrillation.  3. Microcytic anemia. Known iron deficiency anemia. Transfuse 2 units packed red blood cells ordered by the Emergency Department. We will dose Lasix after these and follow CBCs q. 6 hours.  Consult gastroenterology. The patent saw Dr.  Rayann Heman as an inpatient, however, it sounds like she did not follow as an outpatient. If active cardiac symptoms goal hemoglobin greater than 10, otherwise greater than 7.    4. Type 2 diabetes non-insulin-requiring, uncomplicated. Hold p.o. agents. Add insulin sliding scale with q. 6 hour Accu-Cheks. 5. Gastroesophageal reflex disease. PPI therapy.  6. Deep venous thrombosis therapy with sequential compression devices.    CODE STATUS: The patient is full code.   CRITICAL CARE TIME SPENT: 55 minutes    ____________________________ Aaron Mose. Hower, MD dkh:JT D: 05/11/2014 22:17:50 ET T: 05/11/2014 22:33:52 ET JOB#: [id number]  cc: Aaron Mose. Hower, MD, <Dictator> DAVID Woodfin Ganja MD ELECTRONICALLY SIGNED 05/12/2014 0:54

## 2014-11-10 NOTE — Consult Note (Signed)
PATIENT NAME:  Theresa Vasquez, Theresa Vasquez MR#:  [id number] DATE OF BIRTH:  06-10-1931  DATE OF CONSULTATION:  05/12/2014  REFERRING PHYSICIAN:     Sona A. Posey Pronto, MD CONSULTING PHYSICIAN:  Arther Dames, MD  REASON FOR CONSULTATION: Iron deficiency anemia.   HISTORY OF PRESENT ILLNESS: Theresa Vasquez is a very sick 81 year old female with a past medical history notable for congestive heart failure, DM2, COPD, who actually presented to the hospital for evaluation of chest pain. In the setting of working this up, she was noticed to have severe anemia with a hemoglobin of 6. She does have a history of chronic iron deficiency anemia and actually has been followed in the [place].   Her hospital course to date so far has been quite rocky. She has struggled with chest pain, hypoxia, RVR. I am unable to get a history from her so far.   I also met her back in 01/2014 for anemia. At that time she had pneumonia and was unresponsive and the plan was to have her follow up in the clinic to follow up her iron studies and her hemoglobin. She did have an appointment, but unfortunately she did cancel that appointment.   In the hospital here, she has not had any stool. The nursing staff is not aware of any issues with any blood in her stools or any black bowel movements.   PAST MEDICAL HISTORY:  1.  CHF.  2.  Iron deficiency anemia.  3.  Hypertension.  4.  DM2.  5.  COPD.  SOCIAL HISTORY: I am unable to obtain. Per the notes, she uses tobacco, but no alcohol.   FAMILY HISTORY: Unable to obtain.   ALLERGIES: NKDA.   REVIEW OF SYSTEMS: Unable to obtain due to altered mental status.   HOME MEDICATIONS: Tramadol, enalapril, divalproex, gabapentin, lorazepam, Effexor,  glipizide, metformin, Advair, DuoNeb, tiotropium, donepezil, Prilosec, hydralazine, vitamin D3.   PHYSICAL EXAMINATION:  VITAL SIGNS: Currently, her temperature is 98.6, pulse is 93, blood pressure 122/64, pulse oximetry is 95% on room air.   GENERAL: Chronically-ill appearing, confused, no acute distress. Appears stated age. HEENT: Normocephalic/atraumatic. Extraocular movements are intact. Anicteric. On high-flow O2 via nasal cannula. NECK: Soft, supple. JVP appears normal. No adenopathy. CHEST: Clear to auscultation. No wheeze or crackle. Respirations unlabored. HEART: Regular. No murmur, rub, or gallop. Normal S1 and S2. ABDOMEN: Soft, nontender, diffusely distended but not taut otherwise. Normal active bowel sounds in all four quadrants.  No organomegaly. No masses EXTREMITIES: No swelling, well perfused. SKIN: No rash or lesion. Skin color, texture, turgor normal. NEUROLOGICAL: Grossly intact. PSYCHIATRIC: Normal tone and affect. MUSCULOSKELETAL: No joint swelling or erythema.   LABORATORY DATA: Sodium is 137, potassium 4.0, BUN 20, creatinine 0.76. Her ferritin is 8 TIBC of 580, percent saturation 4. Her liver enzymes are normal. Her albumin is 2.8. Cardiac enzymes are negative. Her BNP is significantly elevated at 4152. Her white count is 14, hemoglobin 7.5, hematocrit 27, that is after transfusion. She came in with it 6. Platelets are 437,000.   ASSESSMENT AND PLAN: Iron deficiency anemia: Unfortunately, she did cancel her outpatient visit to further address this issue. Currently, she is now very ill. She has been having trouble with rapid ventricular rate and also is requiring high-flow oxygen along with altered mental status. Right now her other cardiac and pulmonary issues trump any iron deficiency anemia issue. If she were to become stable enough medically it would be reasonable to perform further workup of her iron deficiency  anemia. However, currently it does not seem that it is the right time.   RECOMMENDATIONS:  1.  We will consider EGD and colonoscopy once more stable from a cardiac and pulmonary standpoint. This can also be done as an outpatient.  2.  Continue to monitor hemoglobin and transfuse as needed.  3.   Continue to watch for any evidence of active bleeding, although this clearly seems to be consistent with a chronic iron deficiency anemia.   Thank you for this consult.    ____________________________ Arther Dames, MD mr:ts D: 05/12/2014 18:58:23 ET T: 05/12/2014 23:47:42 ET JOB#: [id number]  cc: Arther Dames, MD, <Dictator> Mellody Life MD ELECTRONICALLY SIGNED 06/12/2014 15:30

## 2014-11-11 NOTE — H&P (Signed)
PATIENT NAME:  Theresa Vasquez, DOLINAR MR#:  [id number] DATE OF BIRTH:  [birth year]  DATE OF ADMISSION:  10/21/2011  PRIMARY CARE PHYSICIAN: Dr. Hall Busing   HISTORY OF PRESENT ILLNESS: Patient is a 81 year old Caucasian female with past medical history significant for history of diverticulitis, history of colitis admission for the same in September 2012 presented to the hospital with complaints of rectal bleeding as well as abdominal pains. According to patient, she was doing well up until today on the day of admission she started having rectal bleed. It started just on the day of admission. She had approximately half cup of blood. She denies any diarrhea, however, admits of having soft somewhat loose bowel movement today here in the Emergency Room. She denies any fevers or chills. Admits of having some lower abdominal pain. Pain is described as intermittent crampy type achy pain, mostly in lower abdominal area. Pain seemed to be relieved after bowel movement.   PAST MEDICAL HISTORY:  1. History of colitis, admission for the same as well as GI bleed in September 2012. 2. History of diverticulitis. 3. History of depression.  4. Anemia.  5. Hyperlipidemia. 6. Hypertension. 7. Diabetes mellitus type 2. 8. Partial hysterectomy. 9. History of left bundle branch block, asymptomatic. 10. Arthritis. 11. Tobacco abuse.   MEDICATIONS: Unknown medications at this point, however, patient was discharged in September 1012 on:  1. Glipizide 2.5 mg p.o. daily.  2. Enalapril 10 mg p.o. twice daily.  3. Fluoxetine 10 mg p.o. daily.  4. Gabapentin 300 mg p.o. twice daily. 5. Protonix 40 mg p.o. daily. 6. Simvastatin 40 mg p.o. daily.  7. Xanax 0.5 mg p.o. at bedtime.  8. Hydralazine 25 mg p.o. twice daily.  9. Nicotine patch 21 mg topically daily. 10. Ciprofloxacin. 11. Flagyl. It is unclear which medications she is still taking.   SOCIAL HISTORY: Smoker, smokes approximately 1 pack per day. Denies alcohol or drug  abuse.   FAMILY HISTORY: Does not know her family history.  REVIEW OF SYSTEMS: CONSTITUTIONAL: Positive for pains in her abdomen, bifocal glasses, dyspnea on exertion intermittently, abdominal pain as mentioned above, rectal bleeding as well as intermittent dysuria. Otherwise, denies any fevers, chills, fatigue, weakness, weight loss or gain. EYES: In regards to eyes denies any blurry vision, double vision, glaucoma, cataracts. ENT: Denies any tinnitus, allergies, epistaxis, sinus pain, dentures, difficulty swallowing. RESPIRATORY: Denies any cough, wheeze, asthma, chronic obstructive pulmonary disease. CARDIOVASCULAR: Denies chest pains, orthopnea, edema, arrhythmias, palpitations, or syncope. GASTROINTESTINAL: Denies nausea, vomiting or diarrhea. Somewhat loose stools. No melena. No change in bowel habits. GENITOURINARY: Denies hematuria, frequency, incontinence. ENDOCRINE: Denies any polydipsia, nocturia, thyroid problems, heat or cold intolerance, or thirst. HEMATOLOGIC: Denies anemia, easy bruising, bleeding, swollen glands. SKIN: Denies any acne, rash, lesion, change in moles. MUSCULOSKELETAL: Denies arthritis, cramps, swelling, gout. NEUROLOGIC: No numbness, epilepsy, tremor. PSYCH: Denies anxiety, insomnia, or depression.   PHYSICAL EXAMINATION:  VITAL SIGNS: On arrival in the hospital patient's vitals: Temperature 98.4, pulse 66, respiration rate 18, blood pressure 151/65, saturation 98% on room air.   GENERAL: This is a well nourished Caucasian female in no significant distress lying on the stretcher.   HEENT: Her pupils are equal, reactive to light. Extraocular movements are intact. No icterus or conjunctivitis. Has normal hearing. No pharyngeal erythema. Mucosa is moist.   NECK: No masses, supple, nontender. Thyroid not enlarged. No JVD or carotid bruits bilaterally. Full range of motion.   LUNGS: Clear to auscultation in all fields. No rales, rhonchi, diminished  breath sounds or wheezing.  No labored inspirations, dullness to percussion, overt respiratory distress.   CARDIOVASCULAR: S1, S2 appreciated. No murmurs, gallops, or rubs were noted. PMI not lateralized. Chest is tender to palpation. 1+ pedal pulses. No lower extremity edema, calf tenderness or cyanosis was noted.   ABDOMEN: Soft, tender in suprapubic area but no rebound or guarding were noted. No hepatosplenomegaly or masses were noted.   RECTAL: Deferred.   MUSCULOSKELETAL: Able to move all extremities. No cyanosis, degenerative joint disease, or kyphosis. Gait is not tested.   SKIN: Skin did not reveal any rashes, lesions, erythema, nodularity, induration. It was warm and dry to palpation.   LYMPH: No adenopathy in cervical region.   NEUROLOGICAL: Cranial nerves grossly intact. Sensory is intact. No dysarthria, aphasia.   PSYCH: Patient is alert, oriented to time, person, place. No significant confusion. Cooperative. Memory is impaired, but no confusion, agitation was noted.  LABORATORY, DIAGNOSTIC AND RADIOLOGICAL DATA: BMP within normal limits. AST slightly elevated to 47, otherwise unremarkable study. White blood cell count is elevated to 15.3, hemoglobin 12.1, platelets 384. Urinalysis: Straw clear urine, negative for glucose, bilirubin, or ketones, specific gravity 1.032, pH 6.0, 1+ blood, negative for protein or nitrates, trace leukocyte esterase, 1 red blood cell, 9 white blood cells, no bacteria were seen. EKG not done. CT scan of abdomen and pelvis with contrast 10/21/2010 showed findings consistent with colitis. Soft tissue thickening was noted in rectum, sigmoid colon as well as left colon consistent with colitis. Lung bases were clear. No free air. Mesenteric vessels were patent. Bladder was not distended and no hydronephrosis was noted, however, phlebolites were noted.   ASSESSMENT AND PLAN:  1. Colitis. Admit patient to medical floor. Start her on antibiotic therapy with Cipro as well as Flagyl as well as  IV fluids. Get gastroenterology consultation for recommendations. 2. Lower GI bleed. Hemoglobin now is okay. Will follow patient's hemoglobin levels every six hours. Will continue IV fluids. Will continue PPI and will get gastroenterologist involved. 3. Diabetes mellitus, type 2. Will continue sliding scale insulin as well as glipizide. Home medications unfortunately are unknown and will change patient to her usual home medications whenever her medication list is known.  4. Hypertension. Will get home medication list, however, will resume her medications which she was discharged on in September 2012.  5. Tobacco abuse. Nicotine replacement will be ordered. That was discussed with patient for five minutes. She was agreeable to cut down as much as possible.   TIME SPENT: 50 minutes.   ____________________________ Theodoro Grist, MD rv:cms D: 10/21/2011 21:01:20 ET T: 10/22/2011 07:06:32 ET JOB#: [id number]  cc: Theodoro Grist, MD, <Dictator> Leona Carry. Hall Busing, MD Theodoro Grist MD ELECTRONICALLY SIGNED 11/10/2011 39:53

## 2014-11-11 NOTE — Consult Note (Signed)
Chief Complaint:   Subjective/Chief Complaint doing well tolerating po, no recurrent rectal bleeding no abd pain.   VITAL SIGNS/ANCILLARY NOTES: **Vital Signs.:   06-Apr-13 09:33   Temperature Temperature (F) 98.9   Celsius 37.1   Temperature Source oral   Pulse Pulse 72   Respirations Respirations 18   Systolic BP Systolic BP 150   Diastolic BP (mmHg) Diastolic BP (mmHg) 70   Mean BP 96   Pulse Ox % Pulse Ox % 93   Pulse Ox Activity Level  At rest   Oxygen Delivery Room Air/ 21 %   Brief Assessment:   Cardiac Regular    Respiratory clear BS    Gastrointestinal details normal Soft  Nontender  Nondistended  No masses palpable  Bowel sounds normal   Routine Chem:  06-Apr-13 04:27    Glucose, Serum 98   BUN 3   Creatinine (comp) 0.57   Sodium, Serum 143   Potassium, Serum 3.5   Chloride, Serum 105   CO2, Serum 28   Calcium (Total), Serum 8.1   Osmolality (calc) 281   eGFR (African American) >60   eGFR (Non-African American) >60   Anion Gap 10  Blood Glucose:  06-Apr-13 07:33    POCT Blood Glucose 107   Assessment/Plan:  Assessment/Plan:   Assessment 1) rectal bleeding abd pain, abnormal ct, possible colitis-symptoms resolved.    Plan 1) GI op fu with Dr Elliott in 2 weeks. finish day total course of abs.  O/P colonoscopy to be arranged at fu. signing off, reconsult if needed.   Electronic Signatures: Skulskie, Martin (MD)  (Signed 06-Apr-13 15:59)  Authored: Chief Complaint, VITAL SIGNS/ANCILLARY NOTES, Brief Assessment, Lab Results, Assessment/Plan   Last Updated: 06-Apr-13 15:59 by Skulskie, Martin (MD) 

## 2014-11-11 NOTE — Consult Note (Signed)
Chief Complaint:   Subjective/Chief Complaint less nausea, no rectal bleeding no abdominal pain.   Brief Assessment:   Cardiac Regular    Respiratory clear BS    Gastrointestinal details normal Soft  Nontender  Nondistended  No masses palpable  Bowel sounds normal   Routine Hem:  05-Apr-13 04:42    WBC (CBC) 7.7   RBC (CBC) 4.04   Hemoglobin (CBC) 10.3   Hematocrit (CBC) 32.5   Platelet Count (CBC) 295   MCV 80   MCH 25.6   MCHC 31.8   RDW 17.5  Routine Chem:  05-Apr-13 04:42    Glucose, Serum 106   BUN 3   Creatinine (comp) 0.60   Sodium, Serum 144   Potassium, Serum 3.7   Chloride, Serum 107   CO2, Serum 28   Calcium (Total), Serum 8.1   Osmolality (calc) 284   eGFR (African American) >60   eGFR (Non-African American) >60   Anion Gap 9  Routine Hem:  05-Apr-13 04:42    Neutrophil % 56.0   Lymphocyte % 27.4   Monocyte % 15.4   Eosinophil % 0.8   Basophil % 0.4   Neutrophil # 4.3   Lymphocyte # 2.1   Monocyte # 1.2   Eosinophil # 0.1   Basophil # 0.0  Blood Glucose:  05-Apr-13 07:59    POCT Blood Glucose 125    12:04    POCT Blood Glucose 151    17:06    POCT Blood Glucose 87    20:31    POCT Blood Glucose 92   Radiology Results: CT:    03-Apr-13 18:51, CT Abdomen and Pelvis With Contrast   CT Abdomen and Pelvis With Contrast    REASON FOR EXAM:    (1) pain bleeding; (2) same  COMMENTS:       PROCEDURE: CT  - CT ABDOMEN / PELVIS  W  - Oct 21 2011  6:51PM     RESULT: History: Pain.    Comparison Study: Prior CT of 03/22/2011.    Findings: Standard CT obtained with 85 cc of Isovue-300. Liver normal.   Spleen normal. Pancreas normal. Adrenals normal. Kidneys normal. Aorta   nondistended. Right lower quadrant unremarkable. Soft tissue thickening   is noted rectum, sigmoid colon, left colon consistent with colitis. Lung   basesare clear. No free air. Mesenteric vessels patent. Bladder   nondistended. No hydronephrosis. Phleboliths.  IMPRESSION:   Findings consistent with colitis.          Verified By: Osa Craver, M.D., MD   Assessment/Plan:  Assessment/Plan:   Assessment 1) colitis-improving.  uncertain etiology. abd pain better, no rectal bleeding today.    Plan 1) will advance diet to full liquids, and if tolerated to low residue. following.  will need GI fu as outpatient with colonoscopy.   Electronic Signatures: Loistine Simas (MD)  (Signed 06-Apr-13 00:27)  Authored: Chief Complaint, Brief Assessment, Lab Results, Radiology Results, Assessment/Plan   Last Updated: 06-Apr-13 00:27 by Loistine Simas (MD)

## 2014-11-11 NOTE — Consult Note (Signed)
PATIENT NAME:  Theresa Vasquez, Theresa Vasquez MR#:  [id number] DATE OF BIRTH:  March 18, 1931  DATE OF CONSULTATION:  10/22/2011  REFERRING PHYSICIAN:   CONSULTING PHYSICIAN:  Manya Silvas, MD  HISTORY OF PRESENT ILLNESS: The patient is a 81 year old white female who was admitted because of suprapubic and left lower quadrant abdominal pain and rectal bleeding. The patient was seen in the hospital in September of 2012 with very similar story. He had a CAT scan showing involvement of the descending colon and some proximal sigmoid. She was supposed to follow-up with [place] after hospitalization. She made and canceled four appointments.   Today a friend is with the patient and with questioning the patient and her friend it is clear that the patient has extremely poor memory. She has very little memory of events through yesterday and does not remember that she had pain. She does remember that she had bleeding yesterday.   Today she denies any abdominal pain and there is no further bleeding.  REVIEW OF SYSTEMS: She denies shortness of breath. No chest pains. No dysuria. No hematuria. No syncope. No nausea or vomiting.   MEDICATIONS: (She was discharged on the following medications in September of 2012)  1. Glipizide 2.5 mg daily. 2. Enalapril 10 mg twice daily. 3. Fluoxetine 10 mg a day.  4. Gabapentin 300 mg twice a day.  5. Protonix 40 mg a day.  6. Simvastatin 40 mg a day.  7. Xanax 0.5 mg at bedtime.  8. Hydralazine 25 mg twice a day. 9. Nicotine patch 21 mg topical daily.   HABITS: The patient is a pack-a-day smoker.   FAMILY HISTORY: She does not know her family history.   PAST MEDICAL HISTORY:  1. Colitis with GI bleed in September 2012.  2. History of diverticulitis.  3. History of depression.  4. Anemia.  5. Hyperlipidemia.  6. Hypertension. 7. Diabetes type 2. 8. Partial hysterectomy.  9. Left bundle branch block.   ALLERGIES: No known drug allergies.   PHYSICAL EXAMINATION:    GENERAL: Elderly pleasant white female with poor memory.   HEENT: Sclerae anicteric. Conjunctivae negative. Tongue negative. Head is atraumatic.   HEART: 1 to 2/6 short systolic murmur.   LUNGS: Clear.   ABDOMEN: Soft and nontender. No hepatosplenomegaly. No masses. No bruits.   ASSESSMENT: The patient supposedly had pain with bleeding yesterday. The pain is resolved and bleeding is resolved. The etiology for this is uncertain. She was supposed to follow-up with [place] and have a colonoscopy, which she did not do. Her last colonoscopy was September 2009 and showed diverticulosis and internal hemorrhoids. She had an upper endoscopy also showing Barrett's esophagus. She has been started on Cipro and Flagyl. I would continue these for now. The most likely possibilities are infectious colitis versus ischemic colitis versus diverticular bleed. Given the absence of pain and tenderness today, diverticular bleed is most likely the possibility and this appears to have stopped.       RECOMMENDATIONS: Have the patient followup with me in the office next week. If she remains in the hospital over the weekend, then would probably do a colonoscopy on Monday, but I expect that she will be able to go home. We will follow with her as an outpatient. ____________________________ Manya Silvas, MD rte:slb D: 10/22/2011 15:28:19 ET T: 10/22/2011 15:57:19 ET JOB#: [id number]  cc: Manya Silvas, MD, <Dictator> Leona Carry. Hall Busing, MD Manya Silvas MD ELECTRONICALLY SIGNED 11/02/2011 11:56

## 2014-11-11 NOTE — Discharge Summary (Signed)
PATIENT NAME:  Theresa Vasquez, Theresa Vasquez MR#:  [id number] DATE OF BIRTH:  1931-04-02  DATE OF ADMISSION:  10/21/2011 DATE OF DISCHARGE:  10/25/2011  PRIMARY CARE PHYSICIAN: Dr. Benita Stabile  REASON FOR ADMISSION: Rectal bleeding and abdominal pain.   DISCHARGE DIAGNOSES:  1. Rectal bleeding and abdominal pain secondary to colitis. 2. Acute left-sided colitis involving rectum, sigmoid and left colon.  3. Acute posthemorrhagic anemia due to rectal bleeding but did not require blood transfusion.  4. Leukocytosis secondary to colitis with normalization of white blood cell count.  5. History of hypertension.  6. History of diabetes mellitus.  7. History of hyperlipidemia.  8. History of depression.  9. History of tobacco abuse.  10. History of colitis September 2012.  11. Possible/suspected chronic obstructive pulmonary disease.  CONSULT: GI with Dr. Vira Agar and Dr. Gustavo Lah.   DISCHARGE DISPOSITION: Home.   DISCHARGE MEDICATIONS:  1. Cipro 500 mg p.o. b.i.d. x5 days.  2. Flagyl 500 mg p.o. q.8 hours x5. 3. Albuterol metered dose inhaler 1 to 2 puffs inhaled every 4 to 6 hours p.r.n. shortness of breath or wheezing. 4. Glipizide extended release 2.5 mg daily. 5. Enalapril 10 mg b.i.d.  6. Fluoxetine 20 mg daily.  7. Gabapentin 300 mg b.i.d.  8. Protonix 40 mg daily. 9. Simvastatin 40 mg daily. 10. Xanax 0.5 mg p.o. at bedtime.  11. Hydralazine 25 mg p.o. b.i.d.  12. Nicotine patch daily as before.   DISCHARGE CONDITION: Improved, stable.   DISCHARGE ACTIVITY: As tolerated.   DISCHARGE DIET: Low sodium, ADA, low fat, low cholesterol.   DISCHARGE INSTRUCTIONS:  1. Take medications as prescribed.  2. Return to Emergency Department for recurrence of symptoms or for worsening abdominal pain, nausea, vomiting, or for recurrence of rectal bleeding or blood in the stool or dark or black stools.   FOLLOW UP INSTRUCTIONS:  1. Follow up with Dr. Benita Stabile within 1 t 2 weeks. Patient needs  repeat hemoglobin and hematocrit check within one week. 2. Follow up with Dr. Vira Agar within 2 weeks.  REFERRALS: Patient being referred to Dr. Devona Konig of pulmonology in 2 to 3 weeks for suspected chronic obstructive pulmonary disease.    LABORATORY, DIAGNOSTIC AND RADIOLOGICAL DATA: CT of the abdomen and pelvis with contrast 10/21/2010: Findings consistent with colitis. There is soft tissue thickening noted in the rectosigmoid colon and left colon consistent with colitis. Lung bases are clear. No free air. Mesenteric vessels patent. Bladder is nondistended. There is no hydronephrosis.   CMP normal on admission except for AST slightly elevated at 47, AST is normal at 30 from 10/22/2011. CBC normal on admission except for WBC 15.3. WBC normal from 10/23/2011. Hemoglobin 10.4 from 10/25/2011, hemoglobin 12.1 on admission.   INR 1 on admission.   Renal function normal on admission.   Lipase normal at 92 on admission.   Mentioned above with slightly elevated AST at 47 at the time of admission but this should have been interpreted with caution as there was slight hemolysis noted and repeat AST was normal at 30 from 10/22/2011.   BRIEF HISTORY/HOSPITAL COURSE: Patient is a 81 year old female with past medical history of hypertension, diabetes mellitus, hyperlipidemia, depression, tobacco abuse, history of colitis September 2012 who presented to the Emergency Department with complaints of rectal bleeding/bright red blood per rectum as well as abdominal pain, however, abdominal pain had quickly resolved. She did have any nausea or vomiting. Please see dictated admission history and physical for pertinent details surrounding the onset of  this hospitalization and please see below for further details. 1. Rectal bleeding/bright red blood per rectum with CT scan findings suggestive of acute left-sided colitis with involvement of rectum, sigmoid colon and left colon. Stool studies were not collected at the  time of admission and patient was placed on supportive care and started on IV fluids and empirically placed on IV antibiotics and with doing so her overall clinical condition has improved and her rectal bleeding has now resolved. She is without any abdominal pain, nausea, vomiting at the time of discharge and is tolerating normal consistency diet well without any complications and as above her rectal bleeding has resolved. She did have some acute posthemorrhagic anemia and blood loss secondary to rectal bleeding but not enough to require blood transfusion as overall her clinical condition has improved and she has remained hemodynamically stable. She was seen in consultation by Dr. Vira Agar and Dr. Gustavo Lah of gastroenterology. Gastroenterologist were in agreement with antibiotic therapy for patient's colitis for now and recommend that the patient follow up in the office with Dr. Vira Agar in two weeks to undergo colonoscopy once her inflammation and colitis have subsided. It was not felt that inpatient colonoscopy would be required as her rectal bleeding has now stopped and she is asymptomatic at the time of discharge. Patient was in agreement with this plan.  2. Acute posthemorrhagic anemia. As above due to rectal bleeding and colitis and although patient lost some blood she was hemodynamically stable and had asymptomatic hypotension, therefore, did not require blood transfusion but she will need to have her hemoglobin and hematocrit closely monitored as an outpatient and will also undergo outpatient colonoscopy as per gastroenterology recommendations and will follow up with Dr. Vira Agar in this regard.  3. Leukocytosis felt to be due to colitis and after being placed on antibiotics her WBC count has normalized. She will also continue Cipro and Flagyl as an outpatient to complete a 10 day course.  4. Hypertension. Blood pressure has been relatively well controlled during this hospitalization. Patient to resume  enalapril and hydralazine upon discharge and she was maintained on these agents while hospitalized as well.  5. Type 2 diabetes mellitus. Initially she was placed on a liquid diet and therefore her glipizide was held and she was maintained on sliding scale insulin. Once her condition was noted to have improved, she was transitioned to a solid diet which she is tolerating well therefore she can now resume glipizide upon hospital discharge.  6. Hyperlipidemia. Patient to continue statin therapy. 7. Tobacco abuse. She had some mild wheezing noted earlier during this hospitalization and may have underlying chronic obstructive pulmonary disease for which she has been started on p.r.n. albuterol metered dose inhaler and bronchodilator support and after doing so her wheezing has resolved. Oxygen saturations have remained stable on room air. She denied any shortness of breath or any cough. There was a concern for possible underlying chronic obstructive pulmonary disease given her smoking history. She will be referred to Dr. Devona Konig of pulmonology as an outpatient for further testing and work-up of chronic obstructive pulmonary disease such as PFTs and for further management. She was strongly counseled on the importance of smoking cessation. She is interested in continuing her nicotine patch as before.  8. Depression. Patient to continue fluoxetine.  9. On 10/25/2011 patient was hemodynamically stable, without any abdominal pain, nausea, vomiting or rectal bleeding and was felt to be stable for discharge home with close outpatient follow up to which patient was agreeable  and was also felt to be safe from gastroenterology standpoint to have the patient discharged home per Dr. Gustavo Lah.   TIME SPENT ON DISCHARGE: Greater than 30 minutes.   ____________________________ Romie Jumper, MD knl:cms D: 10/30/2011 00:21:56 ET T: 11/01/2011 15:33:39 ET JOB#: [id number]  cc: Romie Jumper, MD, <Dictator> Leona Carry  Hall Busing, MD Manya Silvas, MD Allyne Gee, MD Romie Jumper MD ELECTRONICALLY SIGNED 11/10/2011 17:48

## 2014-11-18 NOTE — Consult Note (Signed)
Patient's hbg is trending down, likely from a GI source.  Agree with 1 unit pRBCs today. Patient's iron stores are also decreased and will give 510mg  IV feraheme today as well.  Will follow.  Electronic Signatures: Delight Hoh (MD)  (Signed on 08-Jan-16 13:17)  Authored  Last Updated: 08-Jan-16 13:17 by Delight Hoh (MD)

## 2014-11-18 NOTE — Consult Note (Signed)
Discussed with patient and son about capsule endoscopy.  May do this this hospitalization.  Requires a 2 liter prep so will wait a few days.  Electronic Signatures: Manya Silvas (MD)  (Signed on 07-Jan-16 18:33)  Authored  Last Updated: 07-Jan-16 18:33 by Manya Silvas (MD)

## 2014-11-18 NOTE — Consult Note (Signed)
Note Type Consult   Subjective: Chief Complaint/Diagnosis:   sepsis, now intubated. History of iron deficiency anemia. HPI:   Patient last seen in clinic on October 01, 2014. She is recently admitted with increasing lethargy, lower extremity cellulitis, and fevers. Patient was subsequently intubated and continues to remain intubated and sedated. Review of systems is unobtainable and there are no family at bedside.r   Review of Systems:  General: weakness  Performance Status (ECOG): 4  Review of Systems:   patient intubated and sedated, review of systems is unobtainable.   Allergies:  No Known Allergies:   Preventive Screening:  Has patient had any of the following test? Colonscopy   Last Colonoscopy: 2013   Smoking History: Smoking History quit 14 months ago with 40 year history.  PFSH: Additional Past Medical and Surgical History: anemia, diabetes, hypertension, hyperlipidemia, partial hysterectomy, colitis, diverticulitis, depression.    Family history: Diabetes.    Social history: Patient denies tobacco or alcohol.   Home Medications: Medication Instructions Last Modified Date/Time  gabapentin 400 mg oral capsule 1 cap(s) orally 2 times a day 23-Mar-16 16:15  metFORMIN 500 mg oral tablet 0.5 tab(s) orally 2 times a day 23-Mar-16 16:15  donepezil 10 mg oral tablet 1 tab(s) orally once a day (at bedtime) 23-Mar-16 16:15  Advair Diskus 250 mcg-50 mcg inhalation powder 1 puff(s) inhaled 2 times a day 23-Mar-16 16:15  Eliquis 5 mg oral tablet 1 tab(s) orally 2 times a day 23-Mar-16 16:15  traMADol 50 mg oral tablet 1 tab(s) orally every 6 hours, As Needed - for Pain 23-Mar-16 16:15  LORazepam 0.5 mg oral tablet 1 tab(s) orally once a day, As Needed - for Anxiety, Nervousness 23-Mar-16 16:15  amiodarone 200 mg oral tablet 1 tab(s) orally once a day (in the morning) 23-Mar-16 16:15  Depakote Sprinkles 125 mg oral delayed release capsule 2 cap(s) orally 3 times a day 23-Mar-16  16:15  venlafaxine 37.5 mg oral capsule, extended release 1 cap(s) orally once a day (in the morning) 23-Mar-16 16:15  enalapril 2.5 mg oral tablet 2 tab(s) orally 2 times a day 23-Mar-16 16:15  furosemide 40 mg oral tablet 1 tab(s) orally once a day (in the morning) 23-Mar-16 16:15  levothyroxine 50 mcg (0.05 mg) oral tablet 1 tab(s) orally once a day (in the morning) 23-Mar-16 16:15  Metoprolol Tartrate 50 mg oral tablet 0.5 tab(s) orally 2 times a day 23-Mar-16 16:15  Melatonin 3 mg oral tablet 1 tab(s) orally once a day (at bedtime) 23-Mar-16 16:15  pantoprazole 40 mg oral delayed release tablet 1 tab(s) orally once a day (in the morning) 23-Mar-16 16:15  Spiriva 18 mcg inhalation capsule 1 cap(s) inhaled once a day 23-Mar-16 16:15  Vitamin D3 1000 intl units oral tablet 1 tab(s) orally once a day (in the morning) 23-Mar-16 16:15  albuterol 2.5 mg/3 mL (0.083%) inhalation solution 3 milliliter(s) inhaled every 4 hours, As Needed - for Shortness of Breath 23-Mar-16 16:15  ferrous sulfate 325 mg oral tablet 1 tab(s) orally once a day (in the morning) 23-Mar-16 16:15   Vital Signs:  :: vital signs stable   Physical Exam:  General: intubated and sedated.  Head, Ears, Nose,Throat: ET tube in place.  Respiratory: mechanical breath sounds.  Cardiovascular: regular rate and rhythm, no murmur, rub, or gallop  Gastrointestinal: soft, nondistended, nontender, no organomegaly.  normal active bowel sounds  Musculoskeletal: No edema  Skin: No rash or petechiae noted  Neurological: sedated.   Laboratory Results: TDMs:  25-Mar-16 03:13  Valproic Acid, Serum  12 (Result(s) reported on 12 Oct 2014 at 01:29PM.)  Routine Chem:  24-Mar-16 06:57   Magnesium, Serum 2.0 (1.7-2.4 THERAPEUTIC RANGE: 4-7 mg/dL TOXIC: > 10 mg/dL  ----------------------- NOTE: New Reference Range  09/25/14)  Glucose, Serum  145 (65-99 NOTE: New Reference Range  09/25/14)  BUN 15 (6-20 NOTE: New Reference Range   09/25/14)  Creatinine (comp) 0.74 (0.44-1.00 NOTE: New Reference Range  09/25/14)  Sodium, Serum  134 (135-145 NOTE: New Reference Range  09/25/14)  Potassium, Serum  3.0 (3.5-5.1 NOTE: New Reference Range  09/25/14)  Chloride, Serum  100 (101-111 NOTE: New Reference Range  09/25/14)  CO2, Serum 31 (22-32 NOTE: New Reference Range  09/25/14)  Calcium (Total), Serum  7.5 (8.9-10.3 NOTE: New Reference Range  09/25/14)  Anion Gap  3  eGFR (African American) >60  eGFR (Non-African American) >60 (eGFR values <55m/min/1.73 m2 may be an indication of chronic kidney disease (CKD). Calculated eGFR is useful in patients with stable renal function. The eGFR calculation will not be reliable in acutely ill patients when serum creatinine is changing rapidly. It is not useful in patients on dialysis. The eGFR calculation may not be applicable to patients at the low and high extremes of body sizes, pregnant women, and vegetarians.)  Phosphorus, Serum  2.4 (2.5-4.6 NOTE: New Reference Range  09/25/14)  25-Mar-16 03:13   Magnesium, Serum 1.9 (1.7-2.4 THERAPEUTIC RANGE: 4-7 mg/dL TOXIC: > 10 mg/dL  ----------------------- NOTE: New Reference Range  09/25/14)  Iron Binding Capacity (TIBC) 270  Unbound Iron Binding Capacity 231.3  Iron, Serum 39 (28-170 NOTE: New Reference Range:  09/25/14)  Iron Saturation 14.4 (Result(s) reported on 12 Oct 2014 at 12:54PM.)  Ferritin ([facility]  1091 (11-307 NOTE: New Reference Range  09/25/14)  Glucose, Serum  246 (65-99 NOTE: New Reference Range  09/25/14)  BUN  27 (6-20 NOTE: New Reference Range  09/25/14)  Creatinine (comp)  1.37 (0.44-1.00 NOTE: New Reference Range  09/25/14)  Sodium, Serum  134 (135-145 NOTE: New Reference Range  09/25/14)  Potassium, Serum 3.9 (3.5-5.1 NOTE: New Reference Range  09/25/14)  Chloride, Serum  100 (101-111 NOTE: New Reference Range  09/25/14)  CO2, Serum  20 (22-32 NOTE: New Reference Range  09/25/14)   Calcium (Total), Serum  8.1 (8.9-10.3 NOTE: New Reference Range  09/25/14)  Anion Gap 14  eGFR (African American)  42  eGFR (Non-African American)  36 (eGFR values <631mmin/1.73 m2 may be an indication of chronic kidney disease (CKD). Calculated eGFR is useful in patients with stable renal function. The eGFR calculation will not be reliable in acutely ill patients when serum creatinine is changing rapidly. It is not useful in patients on dialysis. The eGFR calculation may not be applicable to patients at the low and high extremes of body sizes, pregnant women, and vegetarians.)    03:30   LDH, Serum 163 (98-192 NOTE: New Reference Range  09/25/14)  Hemoglobin A1c ([facility]) 5.7 (4.0-6.0 NOTE: New Reference Range  09/25/14)  Routine Hem:  24-Mar-16 06:57   WBC (CBC)  12.1  RBC (CBC)  2.95  Hemoglobin (CBC)  7.6  Hematocrit (CBC)  26.0  Platelet Count (CBC) 225  MCV 88  MCH  25.9  MCHC  29.3  RDW  26.3  Neutrophil % 82.3  Lymphocyte % 7.6  Monocyte % 9.8  Eosinophil % 0.0  Basophil % 0.3  Neutrophil #  10.0  Lymphocyte #  0.9  Monocyte #  1.2  Eosinophil # 0.0  Basophil # 0.0 (Result(s) reported on 11 Oct 2014 at 07:14AM.)   Assessment and Plan: Impression:   sepsis, now intubated. History of iron deficiency anemia. Plan:   1. Sepsis: Patient now intubated and sedated. She is on pressors and receiving appropriate antibiotics. She is at high risk for cardiac arrhythmia and death. Continue current treatment.Anemia: Patient's hemoglobin is below her baseline. She recently received 2 infusions of IV iron on March 14 and again on March 21. Although there is a reported history of hemolytic anemia, there is no evidence of this given a normal LDH. Haptoglobin is pending. No intervention is needed at this time. Continue to monitor daily CBC and okay to transfuse if they if hemoglobin falls below 7.0.  consult, will follow.  Advance Directive:  Advance Directive ([place]) yes   Do you want to revise or change your advance directive? No   Electronic Signatures: Delight Hoh (MD)  (Signed 25-Mar-16 18:26)  Authored: Note Type, CC/HPI, Review of Systems, ALLERGIES, Preventive Screening, Smoking Cessation, Patient Family Social History, HOME MEDICATIONS, Vital Signs, Physical Exam, Lab Results Review, Assessment and Plan, Advance Directive   Last Updated: 25-Mar-16 18:26 by Delight Hoh (MD)

## 2014-11-18 NOTE — Discharge Summary (Signed)
PATIENT NAME:  Theresa Vasquez, Theresa Vasquez MR#:  [id number] DATE OF BIRTH:  14-Apr-1931  DATE OF ADMISSION:  10/10/2014 DATE OF DISCHARGE:  10/17/2014  ADMITTING DIAGNOSIS: Lethargy and fever.   DISCHARGE DIAGNOSES:  1.  Acute encephalopathy due to clinical sepsis, now mental status back to baseline.  2.  Acute respiratory failure with hypoxia requiring intubation due to acute diastolic congestive heart failure, now extubated off oxygen.  3.  Acute diastolic congestive heart failure on chronic diastolic congestive heart failure, now improved.  4.  Clinical sepsis due to foot cellulitis, now improved.  5.  Anemia with previous history of hemolytic anemia in the past, but no evidence of hemolytic anemia.  Hemoglobin did drop.  Status post transfusion now hemoglobin is stable.  6.  Chronic obstructive pulmonary disease.  7.  Hypothyroidism.  8.  History of atrial fibrillation back on Eliquis and amiodarone.  9.  Gastroesophageal reflux disease.  10.  Hypokalemia.  11.  Depression and anxiety. 12.  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  13.  History of urinary tract infection.  14.  History of chronic obstructive pulmonary disease.  CONSULTANTS:  Dr. Mortimer Fries, Dr. Ermalinda Memos, Dr. Grayland Ormond.  PERTINENT LABORATORIES AND EVALUATIONS:  Admitting glucose 145, BUN 15, creatinine 0.74, sodium 134, potassium 3.0, chloride 100, CO2 31, calcium 7.5, phosphorus 2.4.  WBC 12.1, hemoglobin was 7.6, platelet count was 225,000. Blood cultures no growth. Urinalysis was negative. Influenza A and B were negative. Echocardiogram showed ejection fraction 55 to 60%.  EKG showed normal sinus rhythm without any ST-T wave changes. CT scan of the head showed stable brain atrophy.  Chest x-ray showed mild pulmonary vascular prominence. Ultrasound of the right leg showed no evidence of DVT.   HOSPITAL COURSE: Please refer to the H and P done by the admitting physician. The patient is an 81 year old white female  who has had 3 admissions in the past 6 months and 2 ED visits.  Comes in with lethargy, fever.  Brought in by family. The patient also was noted to have respiratory difficulties.  She required intubation and was in the ICU.  She was placed on broad-spectrum antibiotics due to sepsis.  It was later found that she likely had cellulitis of her lower extremities. The patient was kept on antibiotics and ventilator and given IV Lasix with improvement in her condition.  The palliative care team saw the patient and her family wanted her continued to be a full code.  The patient slowly improved and was extubated. Her respiratory status is now back to baseline. She is off oxygen. The patient also was noted to have a drop in hemoglobin due to her history of hemolytic anemia. Haptoglobin and LDH were checked and they did not suggest hemolytic process. She was transfused.  With transfusion, the patient's hemoglobin is stable. The patient is doing much better. At this time, she needs further rehab and therapy.  Rehab has been arranged.   DISCHARGE MEDICATIONS: Gabapentin 400 mg 1 tablet p.o. b.i.d., metformin 500 one half tablet p.o. b.i.d., donepezil 10 at bedtime, Advair 250/50 one puff b.i.d., Eliquis 5 mg 1 tablet p.o. b.i.d., amiodarone 200 daily, Depakote 125 two capsules t.i.d., venlafaxine 37.5 one tablet p.o. daily, Enalapril 2.5 two tablets 2 times a day, levothyroxine 50 mcg daily, metoprolol tartrate 25 mg 1 tablet p.o. b.i.d., melatonin 3 mg at bedtime, Spiriva 18 mcg daily, albuterol ipratropium inhalation 4 hours as needed, iron sulfate 325 p.o. daily, tramadol 50 q. 6 p.r.n.,  lorazepam 0.5 daily as needed, Advair 250/50 one puff b.i.d., Lasix 40 one tablet p.o. b.i.d., famotidine 20 one tablet  p.o. b.i.d., Senna 1 tablet p.o. q. 12, Augmentin 875/125 one tablet p.o. q. 12 x 5 days, potassium chloride 20 mEq 1 tablet p.o. b.i.d.   DISCHARGE DIET: Low sodium, low fat, low cholesterol, carbohydrate-controlled  diet. Regular consistency with mechanical soft, thin liquids, well-chopped ground meats, medium puree.   ACTIVITY: As tolerated with PT evaluation and treatment.   FOLLOWUP:  With M.D. at the skilled nursing facility. Check a BMP in 5 days.   TIME SPENT: 35 minutes on this patient.   ____________________________ Lafonda Mosses. Posey Pronto, MD shp:sp D: 10/17/2014 12:00:20 ET T: 10/17/2014 12:16:57 ET JOB#: [id number]  cc: Valen Mascaro H. Posey Pronto, MD, <Dictator> Alric Seton MD ELECTRONICALLY SIGNED 10/19/2014 14:18

## 2014-11-18 NOTE — H&P (Signed)
PATIENT NAME:  Theresa Vasquez, ENT MR#:  [id number] DATE OF BIRTH:  08-14-1930  DATE OF ADMISSION:  07/25/2014  PRIMARY CARE PHYSICIAN:  Guadalupe Maple, MD   REQUESTING PHYSICIAN:  Earleen Newport, MD    CHIEF COMPLAINT: Low hemoglobin.   HISTORY OF PRESENT ILLNESS: The patient is an 81 year old female with a known history of iron deficiency anemia. He is being admitted with severe symptomatic anemia. Patient fell several weeks ago complaining of severe right knee pain and buttock pain. She had a follow-up with her primary care physician yesterday and had her lab work done. She received a call this morning as her hemoglobin was noted to be 5.4, at her primary care physician's office laboratory work. She was scheduled to get her knee and her tailbone x-ray today as ordered by her primary care physician, but she ended up here in the Emergency Department. While in the ED, she was found to have a hemoglobin of 5.6, and she is complaining of severe pain in her right knee and her buttocks area. She is being admitted for further evaluation and management.   PAST MEDICAL HISTORY:  1.  History of diastolic heart failure with EF of 50%.  2.  Severe iron deficiency anemia.  3.  Hyperlipidemia.  4.  Hypertension.  5.  Type 2 diabetes.  6.  COPD  non oxygen dependent.   SOCIAL HISTORY: No smoking, no alcohol, no drug use.   FAMILY HISTORY: Positive for diabetes.   ALLERGIES: No known drug allergies.   MEDICATIONS AT HOME: 1.  Advair 250/50 one puff b.i.d.  2.  Amiodarone 200 mg p.o. daily.  3.  Divalproex sodium 125 mg 2 capsules p.o. t.i.d.  4.  Donepezil 10 mg p.o. at bedtime.  5.  Effexor XR 37.5 mg p.o. daily.  6.  Eliquis 5 mg p.o. b.i.d.  7.  Enalapril 2.5 mg 2 tablets p.o. b.i.d.  8.  Lasix 40 mg p.o.  9.  Gabapentin 400 mg p.o. b.i.d.  10.  Glipizide 5 mg p.o. daily.  11.  Lopressor 50 mg 1/2 tablet p.o. b.i.d.  12.  Lorazepam 0.5 mg p.o. daily as needed.  13.  Melatonin 3 mg p.o.  at bedtime.  14.  Metformin 500 mg 1/2 tablet p.o. b.i.d.  15.  Protonix 40 mg p.o. b.i.d.  16.  Spiriva once daily.  17.  Tramadol 50 mg p.o. every 6 hours as needed.  18.  Vitamin D3 of 1000 international units once daily.   REVIEW OF SYSTEMS:  CONSTITUTIONAL: No fever, positive for fatigue and weakness.  EYES: No blurred or double vision.  ENT: No tinnitus or ear pain.  RESPIRATORY: No cough, wheezing, hemoptysis.  CARDIOVASCULAR: No chest pain, orthopnea, edema.  GASTROINTESTINAL: No nausea, vomiting, diarrhea.  GENITOURINARY:  No dysuria or hematuria.  ENDOCRINE: No polyuria or nocturia.  HEMATOLOGY: Positive for chronic anemia. No easy bruising or bleeding.  SKIN: No rash or lesion.  MUSCULOSKELETAL: Pain in her right knee and buttocks status post fall.  NEUROLOGIC: No tingling, numbness or weakness.  PSYCHIATRY: Positive for anxiety, depression, and insomnia.   PHYSICAL EXAMINATION: VITAL SIGNS: Temperature 98, heart rate 70 per minute, respirations 18 per minute, blood pressure 134/53, she is saturating 99% on room air.   GENERAL: looks critically sick with skin pallor EYES: Pupils equal, round, reactive to light and accommodation. No scleral icterus. Extraocular muscles intact. scleral pallor present HEENT: Head atraumatic, normocephalic. Oropharynx and nasopharynx clear.  NECK: Supple. No jugulovenous distention.  No thyroid enlargement or tenderness.   LUNGS: Decreased breath sounds at the bases. No wheezing, rales, rhonchi, or crepitation. CARDIOVASCULAR: S1, S2 normal. No murmurs, rubs, or gallop.  LUNGS: Clear to auscultation bilaterally. No wheezing, rales, rhonchi, crepitation.  ABDOMEN: Soft, nontender, nondistended. Bowel sounds present. No organomegaly or mass.  EXTREMITIES: No pedal edema, cyanosis or clubbing.  SKIN: Warm and dry. pallor present NEUROLOGIC: Cranial nerves III through XII intact. Muscle strength is 5/5.  Extremities sensation intact.   PSYCHIATRIC: The patient is alert and oriented x 3.  MUSCULOSKELETAL: She has tenderness in her tailbone area. She has minimal joint effusion in the knees with minimal tenderness, right more than the left knee.    LABORATORY PANEL: Normal BMP, except sodium 135, normal CBC except hemoglobin of 5.6, hematocrit 19.2, platelet 441,000, MCV 77, UA showed WBC in clumps, trace bacteria, 251 WBCs, 2+ leukocyte esterase.   Right knee x-ray showed no acute pathology.  Sacrum and coccyx x-ray in the ED showed slight deformity of the sacrococcygeal junction, possibly chronic, although cannot rule out acute fracture.  IMPRESSION AND PLAN: 1.  Anemia likely acute on chronic, severe iron deficiency with likely slow gastrointestinal loss as her Hemoccult stool is still positive, could be slow oozing from her underlying gastric ulcer that she had it on last endoscopy done in October of 2015, those were nonbleeding ulcer. We will continue on Protonix twice a day, consult gastroenterology and oncology; she has been following with Dr. Grayland Ormond, may need iron infusion also; for now, we will go ahead and order 1 unit of packed red blood cell as her hemoglobin is down to 5.6. We will monitor her hemoglobin.  2.  Urinary tract infection based on urinalysis. We will get urine culture, continue on Levaquin.  3.  Knee/buttock pain from recent fall about a few weeks ago. May have underlying sacrococcygeal fracture but nothing on knee x-ray, we will just provide pain management for now. We will get physical therapy consultation, may need a placement.  4.  Depression. We will continue home medication.  5.  Atrial fibrillation. We will obtain 12 lead EKG. She may not be an ideal candidate for anticoagulation. She has been on Eliquis but she is back in normal sinus rhythm at least on the monitor in the Emergency Department. We will hold off Eliquis for now. Monitor on telemetry. Consider cardiology consultation for anticoagulation  decision if need. This was discussed with son who was at the bedside.   CODE STATUS: Full code.   We will consult palliative care. The case was discussed with  Josh from  palliative care. Son is in agreement with the same.   Total time taking care of this patient: 45 minutes.    ____________________________ Lucina Mellow. Manuella Ghazi, MD vss:nt D: 07/25/2014 14:42:07 ET T: 07/25/2014 15:03:12 ET JOB#: [id number]  cc: Sherleen Pangborn S. Manuella Ghazi, MD, <Dictator> Guadalupe Maple, MD Corey Skains, MD  Lucina Mellow [facility] MD ELECTRONICALLY SIGNED 08/02/2014 22:11

## 2014-11-18 NOTE — Consult Note (Signed)
PATIENT NAME:  Theresa Vasquez, Theresa Vasquez MR#:  [id number] DATE OF BIRTH:  [birth year]  DATE OF CONSULTATION:  07/25/2014  REFERRING PHYSICIAN:   CONSULTING PHYSICIAN:  Manya Silvas, MD  HISTORY OF PRESENT ILLNESS: The patient is an 81 year old white female with a history of recurrent iron deficiency anemia. She had a colonoscopy done 10/03/2012 that showed multiple small mouth diverticula and internal hemorrhoids. She had an upper endoscopy done that showed 3 small AVMs of the duodenum and these were cauterized.    She had recurrent iron deficiency anemia and was seen by Dr. Rayann Heman and an upper endoscopy was done 05/17/2014 and this showed multiple ulcers/erosions that were felt likely to be the source of blood loss. There were many nonbleeding linear superficial gastric ulcers, the largest was 7 mm.   The patient has been on Eliquis for anticoagulation because of a history of diastolic congestive heart failure and episodic atrial fibrillation with very elevated heart rate. This has likely been a cause for her anemia as she every few months presents to the hospital or to her doctor with severe anemia, hemoglobin in the 5 range. She was seen by her primary doctor and then blood work came back showing it to be hemoglobin of 5.4 and she was directed to go to the Emergency Room where she was admitted.   PAST MEDICAL HISTORY:  1.  Diastolic heart failure, ejection fraction 50%.  2.  Severe iron deficiency anemia.  3.  Hyperlipidemia.  4.  Hypertension.  5.  Type 2 diabetes.  6.  COPD not on oxygen.   SOCIAL HISTORY: Does not smoke. Does not drink. Does not shoot drugs.   FAMILY HISTORY: Positive for diabetes.   ALLERGIES: No known drug allergies.   MEDICATIONS AT HOME:  Advair 250/50 one puff b.i.d., amiodarone 200 mg p.o. daily, divalproex 125 mg 2 capsules p.o. t.i.d., donepezil 10 mg at bedtime, Effexor-XR 37.5 mg daily, Eliquis 5 mg b.i.d., enalapril 2.5 mg 2 tablets b.i.d., Lasix 40 mg a day,  gabapentin 400 mg b.i.d., glipizide 5 mg daily, Lopressor 25 mg p.o. b.i.d., lorazepam 0.5 mg daily as needed, melatonin 3 mg at bedtime, metformin 500 mg half a tablet b.i.d., Protonix 40 mg b.i.d., Spiriva once daily, tramadol 50 mg q. 6 hours p.r.n., vitamin D 1000 units a day.   REVIEW OF SYSTEMS:  No fever. No recent change in vision or hearing. No asthma or wheezing. No chest pains. No abdominal pain. No dysuria or hematuria. No black-looking stools. Skin, she has chronic venous stasis changes with thickening of her skin on her legs.   PHYSICAL EXAMINATION:  GENERAL: Elderly white female in no acute distress.  VITAL SIGNS: Temperature 98.4, pulse 72, respirations 20, blood pressure 138/67.  HEENT: Sclerae nonicteric. Conjunctivae pale. Tongue is somewhat pale. Head is atraumatic. CHEST: Clear.  NECK: Shows a right carotid bruit.  HEART: Shows 2/6 systolic ejection murmur.  ABDOMEN: No hepatosplenomegaly. No masses. No bruits.  EXTREMITIES: Show significant thickening and venous stasis changes of the legs.   LABORATORY DATA: Glucose 138, BUN 13, creatinine 0.91, sodium 135, potassium 4.3, chloride 98, CO2 of 30, calcium of 8. White count 7.9, hemoglobin 5.6, hematocrit 19.2, platelet count 441,000. O positive blood with negative antibody screen. Urinalysis shows 2 + leukocyte esterase, 250 white cells per high-powered field with WBCs in clumps. A right knee x-ray done because of complaints of pain shows no acute findings. Sacrum and coccyx shows slight deformity of the sacrococcygeal junction, likely chronic.  ASSESSMENT: The patient with recurrent need for transfusions for recurrent iron deficiency anemia, likely due to gastrointestinal abnormalities which have been present in the past such as arteriovenous malformations or gastric erosions/ulcerations. She has had very low iron levels. She has gotten periodic episodic transfusions which help for a while. Because of the need to take Eliquis  for recurrent atrial fibrillation to decrease the  risk of strokes it makes her likely to bleed from gastrointestinal abnormalities.   At this time would recommend transfusing her with 1 unit a day to try to build her blood up to about 9 or 10. Because it is going to drift down slowly when she leaves the hospital she should therefore be transfused to a somewhat higher level than normally is achieved. She just had an upper endoscopy 3 months ago. The possibility of finding AVMs which might be treatable is of consideration. Another test that might be run would be to do a small bowel capsule study to see if there are any other areas in the small bowel that might need to be cauterized. Dr. Rayann Heman saw her in October. I will discuss the case with him tomorrow, but we would not consider endoscopic procedures until her hemoglobin is built up, unless she were actively bleeding of course.     ____________________________ Manya Silvas, MD rte:bu D: 07/25/2014 18:46:00 ET T: 07/25/2014 19:24:09 ET JOB#: [id number]  cc: Guadalupe Maple, MD Manya Silvas, MD, <Dictator>  Manya Silvas MD ELECTRONICALLY SIGNED 08/30/2014 14:09

## 2014-11-18 NOTE — Consult Note (Signed)
Pt had capsule endoscopy study, results are pending, will be reviewed tomorrow.    Electronic Signatures: Manya Silvas (MD)  (Signed on 11-Jan-16 16:01)  Authored  Last Updated: 11-Jan-16 16:01 by Manya Silvas (MD)

## 2014-11-18 NOTE — Consult Note (Signed)
Hgb up to 9.6, will give clear liq supper, 2 L of Miralax this evening and capsule study tomorrow.  VSS afeb, 94% sat on 2L.  Electronic Signatures: Manya Silvas (MD)  (Signed on 10-Jan-16 11:09)  Authored  Last Updated: 10-Jan-16 11:09 by Manya Silvas (MD)

## 2014-11-18 NOTE — H&P (Addendum)
PATIENT NAME:  Theresa Vasquez, Theresa Vasquez MR#:  [id number] DATE OF BIRTH:  07-23-1930  DATE OF ADMISSION:  10/10/2014  PRIMARY DOCTOR:  Dr. Golden Pop.    EMERGENCY ROOM PHYSICIAN:  Dr. Loura Pardon.    CHIEF COMPLAINT:  Lethargy and fever.    The patient is an 81 year old female patient brought in by family because of generalized weakness associated with a fever and right leg swelling.    HISTORY OF PRESENT ILLNESS: An 81 year old female with history of hypertension, diabetes, chronic iron deficiency anemia, COPD, brought in because of lethargy, unable to walk with right leg swelling. Noted to have a temperature 103 Fahrenheit since yesterday, the patient noticed that right leg is swollen and tender to palpation since today morning. No chest pain. No trouble breathing. No cough. The patient's temperature was 103 on arrival and she also was lethargic when she came.  She had vancomycin and Zosyn in the ER and 2 liters of fluid and according to the nurse the patient improved well.  Her lethargy has been much better. Temperature dropped down to 100.2 with Tylenol. The patient denies any complaints except the right leg pain now. The patient has been followed with home health people, according to the son she was normal and did not see any leg swelling due to the fact that she wears socks all the time.    PAST MEDICAL HISTORY:  Significant for COPD, hypertension, diabetes, hyperlipidemia, severe iron deficiency anemia, gets iron injections at cancer center, history of diastolic heart failure, EF 50%. 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 She also has a history of UTI before, paroxysmal atrial fibrillation, COPD, and the patient has been on oxygen on and off.  ALLERGIES: No known allergies.   SOCIAL HISTORY: No  alcohol. No smoking. No drugs.   FAMILY HISTORY: Significant for diabetes.    MEDICATIONS AT HOME:   1. Glipizide 5 mg daily.  2. Metformin 500 mg half a tablet twice daily.  3. Effexor-XR 37.5 mg p.o. daily.  4. Aricept 10 mg at bedtime.  5. Neurontin 400 mg p.o. b.i.d.   6. Advair Diskus 250/50 one puff b.i.d.  7. Vitamin D 1000 units once a day.  8. Pantoprazole 40 mg p.o. b.i.d.  9. Depakote 125 mg 2 capsules p.o. t.i.d.   11. Lopressor 25 mg p.o. b.i.d.  12. Amiodarone 200 mg p.o. daily.  13. Eliquis 5 mg p.o. b.i.d.  14. Melatonin 3 mg at bedtime.  15. Tramadol 50 mg every 6 hours as needed. 16. Enalapril 5 mg p.o. b.i.d.   18. Albuterol 2.5 mg every 4 hours as needed for trouble breathing.  19. Potassium chloride 20 mEq p.o. daily.    REVIEW OF SYSTEMS:   CONSTITUTIONAL:  Has fever today.  No fatigue, but has right leg pain.  EYES: No blurred vision.  EARS, NOSE, AND THROAT: No tinnitus. No epistaxis. No difficulty swallowing.  RESPIRATIONS: The patient has no cough. No wheezing, but according to the son she is needing to use oxygen since last 2 days. The patient was on chronic oxygen before, but according to son she did not use it for a long time, but since yesterday she has to be on oxygen.  CARDIOVASCULAR: No chest pain. No orthopnea.  GENITOURINARY: No dysuria or hematuria.  ENDOCRINE: No  polyuria or nocturia.  HEMATOLOGIC: She does have iron deficiency anemia. The patient gets iron injections, according to the son she sees Dr. Grayland Ormond and the patient saw Dr. Grayland Ormond on MUSCULOSKELETAL: No joint pain.  NEUROLOGIC: No numbness or weakness.  No dysarthria.   PSYCHIATRIC:  No anxiety or insomnia.  MUSCULOSKELETAL: As I mentioned she has right leg pain.   PHYSICAL EXAMINATION:   VITAL SIGNS:  Temperature 102.5, heart rate 83, blood pressure 127/69, saturations 82% on room air. She is on 2 liters, saturation is about 98%.  GENERAL:  Shows 81 year old obese female, not  in distress.   HEENT:  PERRLA.  EOM are intact. No scleral icterus. No conjunctivitis. No pharyngeal erythema.  NECK:  No thyroid enlargement, supple. No masses. No lymphadenopathy. No JVD.  RESPIRATIONS:  Bilaterally clear to auscultation. Breath sounds are present bilaterally. No wheezing. No rales.  CARDIOVASCULAR: S1, S2, regular. No murmurs. Femoral pulse present. Pedal pulse on the right side not elicited because of edema.  ABDOMEN: Soft, nontender. Bowel sounds present.  MUSCULOSKELETAL: The patient has right leg swelling with areas of blisters and swelling extending up to the inner part of the thigh on the right side. The patient has tenderness to palpation as well.  SKIN:  Has skin rashes on right leg with swelling and erythema.  LYMPHATICS: No lymphadenopathy.  NEUROLOGIC: Cranial nerves II through XII intact.  Power 5 out of 5 upper and lower extremities. Sensation intact. DTRs 2 + bilaterally.  PSYCHIATRIC:  Oriented to time, place, person.   LABORATORY DATA:  1.  Head CAT scan showed brain atrophy and microvascular changes.  2.  Chest x-ray shows mild pulmonary vascular congestion without frank edema.  3.  Glucose 149.  4.  White count 17.3, hemoglobin 9, hematocrit 29.8, platelets 279,000. Neutrophils 82.1. 5.  Electrolytes, sodium is 132, potassium 3.3, chloride 92, bicarbonate 29, BUN 23, creatinine 1, glucose 136.  6.  EKG shows normal sinus rhythm, 81 beats per minute.   ASSESSMENT AND PLAN:  1.  The patient is an 81 year old female patient with fever and right leg cellulitis. The patient has sepsis with fever and relative hypotension and elevated white count, admitted to hospitalist service, started on vancomycin and Zosyn. Check right leg ultrasound to evaluate for DVT.  The patient's blood cultures are followed.  2.  Hypoxia likely secondary to sepsis. Continue oxygen,  3. History of type 2 diabetes mellitus. Continue home medications along with sliding scale with  coverage.  4.  History of chronic atrial fibrillation. She is on amiodarone and also Eliquis. Continue them. 5.  Chronic obstructive pulmonary disease. The patient has no wheezing. Continue her Advair Diskus and albuterol.  6.  The patient has iron deficiency anemia. She takes ferrous sulfate. Continue them.  7.  History of chronic diastolic heart failure. Right now she is slightly volume depleted so hold her furosemide and replace the potassium for her hypokalemia.  8.  Dementia. Continue donepezil 10 mg daily.  9.  Hypothyroidism. Continue Synthroid 50 mcg daily.  10.  Neuropathy. Continue Neurontin 400 mg p.o. b.i.d.   11.  Lethargy, metabolic encephalopathy secondary to sepsis from cellulitis of the right leg.   TIME SPENT ON THIS ADMISSION: 55 minutes.    ____________________________ Epifanio Lesches, MD sk:bu D: 10/10/2014 19:25:01 ET T: 10/10/2014 20:27:38 ET JOB#: [id number]  cc: Epifanio Lesches, MD, <Dictator> Kathlene November. Grayland Ormond, MD Guadalupe Maple, MD Epifanio Lesches MD ELECTRONICALLY SIGNED 11/20/2014 12:37

## 2014-11-18 NOTE — Consult Note (Signed)
Pt color looks better, hgb 9.3, prominent neck veins at 45 degrees.VSS afebrile.  Capsule endo Monday.  Electronic Signatures: Manya Silvas (MD)  (Signed on 09-Jan-16 16:21)  Authored  Last Updated: 09-Jan-16 16:21 by Manya Silvas (MD)

## 2014-11-18 NOTE — Consult Note (Signed)
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  Will talk to her about EGD and treatment of this.  Electronic Signatures: Manya Silvas (MD)  (Signed on 12-Jan-16 11:15)  Authored  Last Updated: 12-Jan-16 11:15 by Manya Silvas (MD)

## 2014-11-18 NOTE — Discharge Summary (Signed)
PATIENT NAME:  CRYSTIN, LECHTENBERG MR#:  [id number] DATE OF BIRTH:  1930-12-05  DATE OF ADMISSION:  07/25/2014 DATE OF DISCHARGE:  08/02/2014  ADDENDUM: To earlier done discharge summary by Dr. Ether Griffins yesterday.  Because of insurance issue, the patient could not go yesterday and is ready to go to nursing home today. For further details, please see the discharge summary done by Dr. Ether Griffins on 13th January. Please attach a copy of that with this discharge summary. ____________________________ Ceasar Lund Anselm Jungling, MD [id number] D: 08/02/2014 10:37:12 ET T: 08/02/2014 10:50:11 ET JOB#: [id number]  cc: Ceasar Lund. Anselm Jungling, MD, <Dictator> Vaughan Basta MD ELECTRONICALLY SIGNED 08/13/2014 23:09

## 2014-11-18 NOTE — Consult Note (Signed)
Note Type Consult   Subjective: Chief Complaint/Diagnosis:   Iron deficiency anemia. HPI:   Patient last seen in clinic in October 2015. She presented to the emergency room with symptomatic anemia and a hemoglobin of 5.4. Likely secondary to chronic GI bleed. Patient was transferred to the intensive care unit after an episode of worsening shortness of breath likely secondary to fluid overload. Currently she continues to have BiPAP but her breathing is improved. She is also mildly confused. She has no neurologic complaints. She denies any fevers. She has a good appetite and denies weight loss. She denies any nausea, vomiting, constipation, or diarrhea. She has no urinary complaints. Patient offers no further specific complaints.   Review of Systems:  Performance Status (ECOG): 2  Review of Systems:   As per HPI. Otherwise, 10 point system review was negative.   Allergies:  No Known Allergies:   Preventive Screening:  Has patient had any of the following test? Colonscopy   Last Colonoscopy: 2013   Smoking History: Smoking History quit 14 months ago with 66 year history.  PFSH: Additional Past Medical and Surgical History: anemia, diabetes, hypertension, hyperlipidemia, partial hysterectomy, colitis, diverticulitis, depression.    Family history: Diabetes.    Social history: Patient denies tobacco or alcohol.   Home Medications: Medication Instructions Last Modified Date/Time  pantoprazole 40 mg oral delayed release tablet 1 tab(s) orally 2 times a day 06-Jan-16 11:13  traMADol 50 mg oral tablet 1 tab(s) orally every 6 hours take only as needed for break through pain 06-Jan-16 11:13  LORazepam 0.5 mg oral tablet 1 tab(s) orally once a day, As Needed  06-Jan-16 11:13  tiotropium 18 mcg inhalation capsule 1 cap(s) inhaled once a day 06-Jan-16 11:13  gabapentin 400 mg oral capsule 1 cap(s) orally 2 times a day 06-Jan-16 11:13  glipiZIDE 5 mg oral tablet 1 tab(s) orally once a day  06-Jan-16 11:13  metFORMIN 500 mg oral tablet 0.5 tab(s) orally 2 times a day 06-Jan-16 11:13  Effexor XR 37.5 mg oral capsule, extended release 1 cap(s) orally once a day 06-Jan-16 11:13  donepezil 10 mg oral tablet 1 tab(s) orally once a day (at bedtime) 06-Jan-16 11:13  Advair Diskus 250 mcg-50 mcg inhalation powder 1 puff(s) inhaled 2 times a day 06-Jan-16 11:13  divalproex sodium 125 mg oral delayed release capsule 2 cap(s) orally 3 times a day 06-Jan-16 11:13  enalapril 2.5 mg oral tablet 2 tab(s) orally 2 times a day 06-Jan-16 11:13  furosemide 40 mg oral tablet 1 tab(s) orally once a day 06-Jan-16 11:13  Lopressor 50 mg oral tablet 0.5 tab(s) orally 2 times a day 06-Jan-16 11:13  amiodarone 200 mg oral tablet 1 tab(s) orally once a day 06-Jan-16 11:13  Eliquis 5 mg oral tablet 1 tab(s) orally 2 times a day 06-Jan-16 11:13  Melatonin 3 mg oral tablet 1 tab(s) orally once (at bedtime) 06-Jan-16 11:13  Vitamin D3 1000 units, 1 tab(s) orally once a day 06-Jan-16 11:13   Vital Signs:  :: vital signs stable, patient afebrile.   Physical Exam:  General: ill-appearing. Mild respiratory distress.  Mental Status: mildly confused.  Eyes: anicteric sclera  Head, Ears, Nose,Throat: BiPAP in place.  Respiratory: course breath sounds.  Cardiovascular: regular rate and rhythm, no murmur, rub, or gallop  Gastrointestinal: soft, nondistended, nontender, no organomegaly.  normal active bowel sounds  Musculoskeletal: No edema  Skin: No rash or petechiae noted  Neurological: alert.   Laboratory Results:  Routine Chem:  07-Jan-16 05:21  B-Type Natriuretic Peptide [facility])  2342 (Result(s) reported on 26 Jul 2014 at 08:41AM.)  Glucose, Serum  125  BUN 10  Creatinine (comp) 0.80  Sodium, Serum 136  Potassium, Serum 4.0  Chloride, Serum  97  CO2, Serum  33  Calcium (Total), Serum  7.4  Anion Gap  6  Osmolality (calc) 272  eGFR (African American) >60  eGFR (Non-African American) >60 (eGFR  values <3m/min/1.73 m2 may be an indication of chronic kidney disease (CKD). Calculated eGFR, using the MRDR Study equation, is useful in  patients with stable renal function. The eGFR calculation will not be reliable in acutely ill patients when serum creatinine is changing rapidly. It is not useful in patients on dialysis. The eGFR calculation may not be applicable to patients at the low and high extremes of body sizes, pregnant women, and vegetarians.)  Routine Hem:  07-Jan-16 05:21   WBC (CBC) 7.9  RBC (CBC)  3.04  Hemoglobin (CBC)  7.6  Hematocrit (CBC)  23.9  Platelet Count (CBC) 401  MCV  79  MCH  24.9  MCHC  31.7  RDW  21.2  Neutrophil % 69.4  Lymphocyte % 14.2  Monocyte % 14.8  Eosinophil % 1.0  Basophil % 0.6  Neutrophil # 5.5  Lymphocyte # 1.1  Monocyte #  1.2  Eosinophil # 0.1  Basophil # 0.1 (Result(s) reported on 26 Jul 2014 at 06:46AM.)   Review Pathology Report:  Assessment and Plan: Impression:   Iron deficiency anemia. Plan:   1. Iron deficiency anemia: Patient has a significantly decreased hemoglobin, likely from a GI source. She received 2 units packed red blood cells yesterday with improvement of her hemoglobin. Continue to monitor daily hemoglobin. She does not require transfusion at this point. Iron stores have been ordered and are pending at time of dictation. No further intervention is needed at this time. Patient has follow-up in the cancer Center in approximately one week for further evaluation. consult, will follow.  Fax to Physician (Removed):  Advance Directive:  Advance Directive ([place]   Do you want to revise or change your advance directive? No   Electronic Signatures: FDelight Hoh(MD)  (Signed 07-Jan-16 17:42)  Authored: Note Type, History of Present Illness, CC/HPI, Review of Systems, ALLERGIES, Preventive Screening, Smoking Cessation, Patient Family Social History, HOME MEDICATIONS, Vital Signs, Physical  Exam, Lab Results Review, Pathology Report Review, Assessment and Plan, Fax to Physician, Advance Directive   Last Updated: 07-Jan-16 17:42 by FDelight Hoh(MD)

## 2014-11-18 NOTE — Consult Note (Signed)
Given her CHF and the recent transfer to the CCU for a while I would hold off on endoscopic investigation of her GI tract.  Would hold Eliquis if possible.  Would like to see in office in 2-3 weeks to discuss possible GI work up versus recurrent iron supplementation.  Electronic Signatures: Manya Silvas (MD)  (Signed on 07-Jan-16 17:44)  Authored  Last Updated: 07-Jan-16 17:44 by Manya Silvas (MD)

## 2014-11-18 NOTE — Consult Note (Signed)
Pt hgb low and getting blood and iron infusion.  Plan for capsule endoscopy study Monday.  Electronic Signatures: Manya Silvas (MD)  (Signed on 08-Jan-16 19:02)  Authored  Last Updated: 08-Jan-16 19:02 by Manya Silvas (MD)

## 2014-11-18 NOTE — Discharge Summary (Signed)
PATIENT NAME:  Theresa Vasquez, Theresa Vasquez MR#:  [id number] DATE OF BIRTH:  06-21-1931  DATE OF ADMISSION:  Aug 14, 2014 DATE OF DISCHARGE:  08/01/2014   ADMISSION DIAGNOSIS: Anemia.  DISCHARGE DIAGNOSES:   1.  Acute on chronic posthemorrhagic anemia. 2.  Gastrointestinal bleed, acute on chronic, status post capsule endoscopy on 07/30/2014 by Manya Silvas, MD. 3.  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 Esophagogastroduodenoscopy in few weeks was recommended by Manya Silvas, MD.  4.  Status post 3 units of packed red blood cell transfusion for acute posthemorrhagic anemia.  5.  Acute on chronic respiratory failure with hypoxia due to acute on chronic diastolic congestive heart failure after packed red blood cell transfusion, resolved clinically as well as radiologically.  6.  Acute encephalopathy, resolved.  7.  Urinary tract infection; Escherichia coli.  8.  Generalized weakness.  9.  History of paroxysmal atrial fibrillation.  10.  Hypertension.  11.  Hyperlipidemia.  12.  Diabetes mellitus.  13.  Chronic obstructive pulmonary disease.  14.  Chronic respiratory failure on oxygen at home intermittently.   DISCHARGE CONDITION: Stable.   DISCHARGE MEDICATIONS: The patient is to continue tiotropium 1 inhalation once daily, gabapentin 400 mg p.o. twice daily, glipizide 5 mg p.o. daily, metformin 500 mg 1/2 tablet twice daily, Effexor-XR 37.5 mg once daily, donepezil 10 mg p.o. at bedtime, Advair Diskus 250/50 one puffs twice daily, Vitamin D3 1000 units once daily, pantoprazole 40 mg p.o. twice daily, divalproex sodium 125 mg 2 capsules 3 times daily, furosemide 40 mg p.o. daily, Lopressor 25 mg p.o. twice daily, amiodarone 200 mg p.o. daily, Eliquis 5 mg p.o. twice daily, melatonin 3 mg p.o. at bedtime, tramadol 50 mg every 6 hours as needed, enalapril 5 mg p.o. twice daily, lorazepam 0.5 mg p.o. once daily as needed, albuterol 2.5 mg, SVNs every 4 hours as needed for shortness  of breath, potassium chloride 20 mEq once daily, Keflex 250 mg p.o. every 8 hours for 5 more days.   HOME OXYGEN: Portable tank at 2 L of oxygen through nasal cannula; please weaned off to room air as tolerated.   DIET: A 2-g-salt, low-fat, low-cholesterol, carbohydrate -controlled diet, mechanical soft consistency.   ACTIVITY LIMITATIONS: As tolerated.   REFERRALS: To physical therapy.   FOLLOWUP APPOINTMENTS: With Dr. Jeananne Rama 2 days after discharge, Dr. Vira Agar as well as Dr. Ubaldo Glassing in 1 week after discharge.   CONSULTANTS: Care management, social work, Dr. Gaylyn Cheers, Dr. Delight Hoh, and Maudry Mayhew, NP for palliative care.   RADIOLOGIC STUDIES: Sacrum and coccyx x-ray 08/14/2014 revealing slight deformity of the sacrococcygeal junction, favored to be chronic; however, recommend correlation for point tenderness to exclude an acute fracture. Right knee complete x-ray, Aug 14, 2014  after fall showed no acute findings. Chest, portable single view, on 07/26/2014 revealed a new patchy upper opacity suspicious for bilateral pneumonia; stable chest otherwise. Chest x-ray PA and lateral on 07/28/2014, showed improved aeration of the lungs, which given the relatively rapid improvement suggests resolving pulmonary edema or less likely atypical infection; likely unchanged small layering bilateral effusions. Repeated chest x-ray, PA and lateral, 07/31/2014 revealed mildly increased interstitial markings favored to reflect a near complete resolution of prior interstitial edema; less likely atypical infection.   HOSPITAL COURSE: The patient is an 81 year old Caucasian female with a history of iron deficiency anemia and chronic gastrointestinal bleed, who presents to the hospital with complaints of symptomatic anemia. Apparently the patient fell down several weeks ago, and was complaining  of some right knee pain as well as buttock pain. She was seen by her primary care physician and had lab  work done, which showed a hemoglobin level of 5.4, and she was sent to the Emergency Room for admission. In the Emergency Room, her vitals: temperature was 98, pulse was 70, respiration rate was 18, blood pressure 134/53, saturation was 99% on room air. Physical exam revealed diffuse breath sounds at the bases, but otherwise no significant abnormalities were found. She did have tenderness in the tailbone area, minimal joint effusion in the knees, with some minimal tenderness in the right side of more than the left side.   The patient's lab data done on arrival to the Emergency Room, 07/25/2014, showed elevated glucose level of 138, sodium 135; otherwise, BMP was unremarkable. Calcium level was low at 8.0. The patient's troponin was less than 0.02. White blood cell count was normal at 7.9, hemoglobin was 5.6, platelet count was 441,000 with low MCV of 77. Absolute neutrophil count was normal at 4.6. Urinalysis revealed 4 red blood cells, 2+ leukocytes esterase, 251 white blood cells, trace bacteria, less than 1 epithelial cell, white blood cell clumps as well as mucus was present. The patient was admitted to the hospital for further evaluation. She was transfused 3 units of packed red blood cells after which the patient's hemoglobin level remained stable. On 07/28/2014 hemoglobin was 9.3 and remained stable until the day of discharge. On 07/31/2014 hemoglobin was rechecked and was found to be 9.6.   Dr. Vira Agar saw the patient in consultation and felt that the patient would benefit from capsule endoscopy, which was performed on 07/30/2014. It revealed, according to Dr. Vira Agar, a small AVM in the proximal small bowel, for which an EGD was recommended and possible cauterization of this AVM whenever her lung condition improved. Unfortunately, after transfusion, the patient went into acute on chronic diastolic CHF, requiring transfer to the Critical Care Unit. With diuresis, however, her condition improved and she  continued to be improved and weaned off oxygen therapy.   On the day of discharge, 08/01/2014, the patient's oxygen saturation were 91% to 95% on room air at rest; however, they would go down into the 80s on exertion. The patient was recommended to continue oxygen therapy and wean off oxygen as tolerated. She is to follow up with cardiologist, Dr. Ubaldo Glassing, as an outpatient. The patient is to continue heart rate limiting medications. The patient's heart rate remains stable in the 60s to 70s, and she remains in the sinus rhythm with bundle branch block.   The patient was noted to be encephalopathic, which was felt to be due to hypoxia as well as an urinary tract infection. The patient s urine, as mentioned above, was abnormal and urine cultures revealed Escherichia coli more than 100,000 colony-forming units, resistant to trimethoprim sulfamethoxazole, ampicillin, as well as levofloxacin; however, sensitive to all other antibiotics including imipenem, gentamicin, ceftriaxone, cefazolin as well as nitrofurantoin. It was also resistant to ciprofloxacin. The patient was initiated on Rocephin and given Keflex upon discharge. The patient is to continue antibiotic therapy for 5 more days. Her encephalopathy resolved with conservative management.   For her chronic medical problems such as paroxysmal AFib, hypertension, hyperlipidemia, diabetes mellitus, COPD, the patient is to continue her outpatient management. No changes were made.   The patient is being discharged in stable condition with the above-mentioned medications and followup.   The patient was evaluated by physical therapy while she was in the hospital, and  they recommended skilled nursing facility placement for rehabilitation. The patient will likely be discharged to a skilled nursing facility today, on 08/01/2014.   TIME SPENT: 40 minutes.    ____________________________ Theodoro Grist, MD rv:MT D: 08/01/2014 10:48:14 ET T: 08/01/2014 11:15:14  ET JOB#: [id number]  cc: Theodoro Grist, MD, <Dictator> Guadalupe Maple, MD Manya Silvas, MD Javier Docker Ubaldo Glassing, MD  Theodoro Grist MD ELECTRONICALLY SIGNED 08/09/2014 10:03

## 2014-11-18 NOTE — Consult Note (Signed)
   Present Illness 81 yo female with history of valvular heart disease,  afib treated with amiodarone and eliquis,  COPD, hypertension, diabetes, hyperlipidemia, severe iron deficiency anemia,  history of diastolic heart failure, EF 55-60%  with very mild aortic valve disease who presented after noting right leg pain and swelling with fever and sob. CXR revealed mild pulmonary vascular engorment but no frank chf. ULtrasound revealed no evidence of dvt. She was initially admited to the floor. She subsequntly developed acute onset sob requrining intubation. She is curently intubated on and norepinephrine drip. Heart rate is relatively stable and appears to be nsr with bbb. She remains on amiodarone and apixiban. She currently appears to have acute on chronic diastollic chf in face of probable sepsis   Physical Exam:  GEN critically ill appearing   NECK No masses   RESP rhonchi   CARD Regular rate and rhythm  Murmur   Murmur Systolic    Systolic Murmur Out flow    ABD denies tenderness   LYMPH negative neck   EXTR negative edema   SKIN positive rashes   PSYCH sedated   Review of Systems:  Subjective/Chief Complaint acute sob and intubation   ROS Pt not able to provide ROS   Medications/Allergies Reviewed Medications/Allergies reviewed   EKG:  EKG NSR    Interpretation sr with intraventricular conduction delay consistant with lbbb    No Known Allergies:    Impression Pt with history of preserved lv function by recent echo showing ef of 55% who was admitted with lower extremety swelling on the right with fever and developed acute respiratory failure. Currently on norepinephrine drip, intubated and sedated. Etiology of event unclear but porobable sepisis with acute on chronic diastollic chf. No dvt on ultrasound. Had been on apixiban for antiocoagulation but has signficant anemia with hgb of 7.6. Has history of hemolytic anemia with most recent hgb of 8.7 drawn 4 wks ago.Her  history of paroxysmal afib was treated at [facility] with cardioversion to sinus rhtyhm with amiodarone and placed on apixiban. Will need to hold [place] for now. Pt had transietn reduction in her ef at [facility] in 05/2014 with ef of 35% while in rapid afib. She required inotropic support during that admission. Would countinue to attempt to wean vent as possible weaning norepi keeping map greater than 65. Stay off of [place]. Cads2Vasc score is 6 but risk too high for anitcoagulation. Continue with amiodarone. Emperic antibiotics for probable sepsis.   Plan CHF-echo revealed preserved lv funciton. Would continue to support map with norepi and wean to keep map greater than 65. Agree with holding metoprolol while on norepi. Remain on amiodarone at 200 to maintain sr  afib-discontinue apixiban and maintain amiodarone at 200 mg daily  sepsis-continue with emperic abx and support with inotropes  anemia-has history of intermitant gi bleeds with Barrett's esophagus and adenomatou polyps. Remain off [place] and agree with hematology and possible gi consults.   Electronic Signatures: Teodoro Spray (MD)  (Signed 25-Mar-16 10:50)  Authored: General Aspect/Present Illness, History and Physical Exam, Review of System, Home Medications, EKG , Allergies, Impression/Plan   Last Updated: 25-Mar-16 10:50 by Teodoro Spray (MD)

## 2014-11-18 NOTE — Consult Note (Signed)
See dictated note.  Pt needs to be transfused slowly  up to a level of 9-10 because she will surely loose blood slowly in the next few weeks/months from GI tract just like she has done for the last 2 years since she has to take Eliquis for her episodic atrial fib to prevent stroke.  Her last EGD was 04/2014 showing multiple erosions/ulcerations.  She had previous AVM in duodenum I cauterized in 2014.  Consideration for a capsule endoscopy to see if other places in small bowel are causing her to loose blood.  Will follow with you.  Electronic Signatures: Manya Silvas (MD)  (Signed on 06-Jan-16 18:50)  Authored  Last Updated: 06-Jan-16 18:50 by Manya Silvas (MD)

## 2014-11-24 ENCOUNTER — Other Ambulatory Visit: Payer: Self-pay | Admitting: Oncology

## 2014-11-24 DIAGNOSIS — D509 Iron deficiency anemia, unspecified: Secondary | ICD-10-CM

## 2014-11-26 ENCOUNTER — Ambulatory Visit: Payer: Self-pay | Admitting: Oncology

## 2014-11-26 ENCOUNTER — Other Ambulatory Visit: Payer: Self-pay

## 2014-11-26 ENCOUNTER — Ambulatory Visit: Payer: Self-pay

## 2014-11-27 ENCOUNTER — Ambulatory Visit: Payer: Self-pay

## 2014-11-27 ENCOUNTER — Other Ambulatory Visit: Payer: Self-pay

## 2014-11-27 ENCOUNTER — Ambulatory Visit: Payer: Self-pay | Admitting: Oncology

## 2014-12-01 ENCOUNTER — Other Ambulatory Visit: Payer: Self-pay | Admitting: Oncology

## 2014-12-01 DIAGNOSIS — D509 Iron deficiency anemia, unspecified: Secondary | ICD-10-CM

## 2014-12-03 ENCOUNTER — Ambulatory Visit: Payer: Self-pay

## 2014-12-03 ENCOUNTER — Ambulatory Visit: Payer: Self-pay | Admitting: Oncology

## 2014-12-03 ENCOUNTER — Inpatient Hospital Stay: Payer: Self-pay

## 2014-12-10 ENCOUNTER — Other Ambulatory Visit: Payer: Self-pay | Admitting: Family Medicine

## 2014-12-10 ENCOUNTER — Ambulatory Visit
Admission: RE | Admit: 2014-12-10 | Discharge: 2014-12-10 | Disposition: A | Discharge status: Home / Self Care | Payer: Medicare Other | Source: Ambulatory Visit | Attending: Family Medicine | Admitting: Family Medicine

## 2014-12-10 DIAGNOSIS — M79671 Pain in right foot: Secondary | ICD-10-CM

## 2014-12-11 ENCOUNTER — Ambulatory Visit
Admission: RE | Admit: 2014-12-11 | Discharge: 2014-12-11 | Disposition: A | Discharge status: Home / Self Care | Payer: Medicare Other | Source: Ambulatory Visit | Attending: Family Medicine | Admitting: Family Medicine

## 2014-12-11 ENCOUNTER — Other Ambulatory Visit: Payer: Self-pay | Admitting: Family Medicine

## 2014-12-11 DIAGNOSIS — W19XXXA Unspecified fall, initial encounter: Secondary | ICD-10-CM | POA: Insufficient documentation

## 2014-12-11 DIAGNOSIS — R52 Pain, unspecified: Secondary | ICD-10-CM

## 2014-12-11 DIAGNOSIS — M79671 Pain in right foot: Secondary | ICD-10-CM | POA: Diagnosis present

## 2014-12-11 DIAGNOSIS — M8580 Other specified disorders of bone density and structure, unspecified site: Secondary | ICD-10-CM | POA: Diagnosis not present

## 2014-12-11 DIAGNOSIS — S92341A Displaced fracture of fourth metatarsal bone, right foot, initial encounter for closed fracture: Secondary | ICD-10-CM | POA: Insufficient documentation

## 2014-12-11 DIAGNOSIS — R609 Edema, unspecified: Secondary | ICD-10-CM

## 2014-12-11 DIAGNOSIS — M7989 Other specified soft tissue disorders: Secondary | ICD-10-CM | POA: Diagnosis present

## 2014-12-13 ENCOUNTER — Inpatient Hospital Stay: Payer: Medicare Other | Attending: Oncology | Admitting: Oncology

## 2014-12-13 ENCOUNTER — Inpatient Hospital Stay: Payer: Medicare Other

## 2014-12-13 ENCOUNTER — Ambulatory Visit: Payer: Medicare Other

## 2014-12-13 VITALS — BP 108/65 | HR 67 | Temp 97.0°F | Resp 18

## 2014-12-13 DIAGNOSIS — I10 Essential (primary) hypertension: Secondary | ICD-10-CM

## 2014-12-13 DIAGNOSIS — D509 Iron deficiency anemia, unspecified: Secondary | ICD-10-CM | POA: Insufficient documentation

## 2014-12-13 DIAGNOSIS — M8580 Other specified disorders of bone density and structure, unspecified site: Secondary | ICD-10-CM | POA: Diagnosis not present

## 2014-12-13 DIAGNOSIS — E039 Hypothyroidism, unspecified: Secondary | ICD-10-CM | POA: Diagnosis not present

## 2014-12-13 DIAGNOSIS — R5383 Other fatigue: Secondary | ICD-10-CM | POA: Insufficient documentation

## 2014-12-13 DIAGNOSIS — R531 Weakness: Secondary | ICD-10-CM | POA: Insufficient documentation

## 2014-12-13 DIAGNOSIS — F329 Major depressive disorder, single episode, unspecified: Secondary | ICD-10-CM | POA: Diagnosis not present

## 2014-12-13 DIAGNOSIS — E785 Hyperlipidemia, unspecified: Secondary | ICD-10-CM | POA: Diagnosis not present

## 2014-12-13 DIAGNOSIS — Z8719 Personal history of other diseases of the digestive system: Secondary | ICD-10-CM | POA: Diagnosis not present

## 2014-12-13 DIAGNOSIS — S92341S Displaced fracture of fourth metatarsal bone, right foot, sequela: Secondary | ICD-10-CM | POA: Insufficient documentation

## 2014-12-13 DIAGNOSIS — E119 Type 2 diabetes mellitus without complications: Secondary | ICD-10-CM | POA: Diagnosis not present

## 2014-12-13 DIAGNOSIS — D649 Anemia, unspecified: Secondary | ICD-10-CM

## 2014-12-13 LAB — CBC WITH DIFFERENTIAL/PLATELET
BASOS ABS: 0 10*3/uL (ref 0–0.1)
Basophils Relative: 1 %
EOS PCT: 1 %
Eosinophils Absolute: 0.1 10*3/uL (ref 0–0.7)
HCT: 30.9 % — ABNORMAL LOW (ref 35.0–47.0)
Hemoglobin: 9.5 g/dL — ABNORMAL LOW (ref 12.0–16.0)
LYMPHS ABS: 2 10*3/uL (ref 1.0–3.6)
Lymphocytes Relative: 29 %
MCH: 25.9 pg — ABNORMAL LOW (ref 26.0–34.0)
MCHC: 30.6 g/dL — ABNORMAL LOW (ref 32.0–36.0)
MCV: 84.7 fL (ref 80.0–100.0)
Monocytes Absolute: 0.9 10*3/uL (ref 0.2–0.9)
Monocytes Relative: 14 %
NEUTROS PCT: 55 %
Neutro Abs: 3.8 10*3/uL (ref 1.4–6.5)
Platelets: 481 10*3/uL — ABNORMAL HIGH (ref 150–440)
RBC: 3.65 MIL/uL — ABNORMAL LOW (ref 3.80–5.20)
RDW: 17.1 % — ABNORMAL HIGH (ref 11.5–14.5)
WBC: 6.8 10*3/uL (ref 3.6–11.0)

## 2014-12-13 LAB — IRON AND TIBC
Iron: 27 ug/dL — ABNORMAL LOW (ref 28–170)
Saturation Ratios: 6 % — ABNORMAL LOW (ref 10.4–31.8)
TIBC: 426 ug/dL (ref 250–450)
UIBC: 399 ug/dL

## 2014-12-13 LAB — FERRITIN: FERRITIN: 16 ng/mL (ref 11–307)

## 2014-12-19 ENCOUNTER — Inpatient Hospital Stay: Payer: Medicare Other | Attending: Oncology

## 2014-12-19 VITALS — BP 145/72 | HR 62 | Temp 97.9°F | Resp 20

## 2014-12-19 DIAGNOSIS — D509 Iron deficiency anemia, unspecified: Secondary | ICD-10-CM | POA: Diagnosis not present

## 2014-12-19 DIAGNOSIS — Z79899 Other long term (current) drug therapy: Secondary | ICD-10-CM | POA: Diagnosis not present

## 2014-12-19 MED ORDER — SODIUM CHLORIDE 0.9 % IV SOLN
Freq: Once | INTRAVENOUS | Status: AC
  Administered 2014-12-19: 20 mL/h via INTRAVENOUS
  Filled 2014-12-19: qty 1000

## 2014-12-19 MED ORDER — SODIUM CHLORIDE 0.9 % IV SOLN
510.0000 mg | Freq: Once | INTRAVENOUS | Status: AC
  Administered 2014-12-19: 510 mg via INTRAVENOUS
  Filled 2014-12-19: qty 17

## 2014-12-20 ENCOUNTER — Other Ambulatory Visit: Payer: Self-pay | Admitting: Family Medicine

## 2014-12-20 MED ORDER — METFORMIN HCL 500 MG PO TABS: 250.0000 mg | ORAL_TABLET | Freq: Every day | ORAL | Status: DC

## 2014-12-20 NOTE — Progress Notes (Signed)
Refill request come through paper. Medication refilled.

## 2014-12-21 ENCOUNTER — Telehealth: Payer: Self-pay

## 2014-12-21 MED ORDER — VENLAFAXINE HCL ER 37.5 MG PO CP24: 37.5000 mg | ORAL_CAPSULE | Freq: Every day | ORAL | Status: DC

## 2014-12-21 MED ORDER — VENLAFAXINE HCL 37.5 MG PO TABS: 37.5000 mg | ORAL_TABLET | Freq: Every day | ORAL | Status: DC

## 2014-12-21 NOTE — Telephone Encounter (Signed)
Erroneous, Rx. Done.

## 2014-12-21 NOTE — Telephone Encounter (Signed)
Pharmacy called, Venlafaxine 37.5mg   immediate release tablets were sent over, patient was previously on Venlafaxine  37.5 Extended release capsules. They want to verify that this was done by choice and not accident.

## 2014-12-21 NOTE — Telephone Encounter (Signed)
Refill request from [facility] for patients Venlafaxine HCL 37.5mg  QD

## 2014-12-21 NOTE — Telephone Encounter (Signed)
Refilled

## 2014-12-26 ENCOUNTER — Telehealth: Payer: Self-pay | Admitting: Family Medicine

## 2014-12-26 NOTE — Telephone Encounter (Signed)
Dr.Johnson is this ok. If so I will call and give verbal order.

## 2014-12-26 NOTE — Telephone Encounter (Signed)
That sounds fine

## 2014-12-26 NOTE — Telephone Encounter (Signed)
Philip from [facility] called and needs a standing PT order extended. Please contact him @ [phone]. Thanks. Arnette Norris stated if he does not answer leave a message as he may be at a patients house.

## 2014-12-26 NOTE — Telephone Encounter (Signed)
Doren Custard with [facility] notified that it is ok to extend PT orders.

## 2014-12-30 NOTE — Progress Notes (Signed)
[facility]  [phone] [phone]  ID: Theresa Vasquez OB: 15-Feb-1931  MR#: [id number]  INO#:[id number]  Patient Care Team: Valerie Roys, DO as PCP - General (Family Medicine)  CHIEF COMPLAINT:  Chief Complaint  Patient presents with  . Follow-up    IDA    INTERVAL HISTORY: Patient returns to clinic today for repeat laboratory work and further evaluation. Her only complaint today is of foot pain secondary to fracture she sustained several days ago. She continues to have chronic weakness and fatigue. She has no neurologic complaints. She denies any fevers. She has a good appetite and denies weight loss. She has no chest pain, but is chronically short of breath and requires oxygen. She denies any nausea, vomiting, constipation, or diarrhea.  She has no urinary complaints. Patient otherwise feels well and offers no further specific complaints.   REVIEW OF SYSTEMS:   Review of Systems  Constitutional: Positive for malaise/fatigue.  Respiratory: Positive for shortness of breath.   Cardiovascular: Negative.   Musculoskeletal:       Foot pain.  Neurological: Positive for weakness.    As per HPI. Otherwise, a complete review of systems is negatve.  PAST MEDICAL HISTORY: Past Medical History  Diagnosis Date  . Anemia   . Diabetes mellitus without complication   . Depression   . Hypertension   . Colitis   . Diverticulitis   . Hypothyroidism   . Hyperlipidemia     PAST SURGICAL HISTORY: Past Surgical History  Procedure Laterality Date  . Abdominal hysterectomy      partial    FAMILY HISTORY: Diabetes.     ADVANCED DIRECTIVES:    HEALTH MAINTENANCE: History  Substance Use Topics  . Smoking status: Not on file  . Smokeless tobacco: Not on file  . Alcohol Use: Not on file     Colonoscopy:  PAP:  Bone density:  Lipid panel:  No Known Allergies  Current Outpatient Prescriptions  Medication Sig Dispense Refill  .  albuterol (PROVENTIL) (2.5 MG/3ML) 0.083% nebulizer solution Take 2.5 mg by nebulization every 4 (four) hours as needed for wheezing or shortness of breath.    Marland Kitchen amiodarone (PACERONE) 200 MG tablet Take 200 mg by mouth daily. Take in the morning    . apixaban (ELIQUIS) 5 MG TABS tablet Take 5 mg by mouth 2 (two) times daily.    . divalproex (DEPAKOTE SPRINKLE) 125 MG capsule Take 125 mg by mouth 3 (three) times daily. Take 2 capsules 3 times a day    . donepezil (ARICEPT) 10 MG tablet Take 10 mg by mouth at bedtime.    . enalapril (VASOTEC) 2.5 MG tablet Take 2.5 mg by mouth 2 (two) times daily. Take 2 tabs 2 times a day.    . famotidine (PEPCID) 20 MG tablet Take 20 mg by mouth 2 (two) times daily.    . ferrous sulfate 325 (65 FE) MG tablet Take 325 mg by mouth daily with breakfast.    . Fluticasone-Salmeterol (ADVAIR) 250-50 MCG/DOSE AEPB Inhale 1 puff into the lungs 2 (two) times daily. 1 puff 2 times a day    . Fluticasone-Salmeterol (ADVAIR) 250-50 MCG/DOSE AEPB Inhale 1 puff into the lungs 2 (two) times daily.    . furosemide (LASIX) 40 MG tablet Take 40 mg by mouth 2 (two) times daily.    Marland Kitchen gabapentin (NEURONTIN) 400 MG capsule Take 400 mg by mouth 2 (two) times daily.    Marland Kitchen levothyroxine (SYNTHROID, LEVOTHROID) 50 MCG  tablet Take 50 mcg by mouth daily before breakfast.    . LORazepam (ATIVAN) 0.5 MG tablet Take 0.5 mg by mouth daily.    . Melatonin 3 MG TABS Take 1 tablet by mouth at bedtime.    . metoprolol (LOPRESSOR) 50 MG tablet Take 50 mg by mouth 2 (two) times daily. Take 1/2 tablet orally 2 times a day    . Potassium Chloride ER 20 MEQ TBCR Take 1 tablet by mouth 2 (two) times daily.    Marland Kitchen tiotropium (SPIRIVA) 18 MCG inhalation capsule Place 18 mcg into inhaler and inhale daily.    . traMADol (ULTRAM) 50 MG tablet Take 50 mg by mouth every 6 (six) hours as needed.    . metFORMIN (GLUCOPHAGE) 500 MG tablet Take 0.5 tablets (250 mg total) by mouth daily with breakfast. 45 tablet 1  .  venlafaxine XR (EFFEXOR XR) 37.5 MG 24 hr capsule Take 1 capsule (37.5 mg total) by mouth daily with breakfast. 30 capsule 6   No current facility-administered medications for this visit.    OBJECTIVE: Filed Vitals:   12/13/14 1425  BP: 108/65  Pulse: 67  Temp: 97 F (36.1 C)  Resp: 18     There is no weight on file to calculate BMI.    ECOG FS:2 - Symptomatic, <50% confined to bed  General: Well-developed, well-nourished, no acute distress. Eyes: anicteric sclera. Lungs: Clear to auscultation bilaterally. Heart: Regular rate and rhythm. No rubs, murmurs, or gallops. Abdomen: Soft, nontender, nondistended. No organomegaly noted, normoactive bowel sounds. Musculoskeletal: No edema, cyanosis, or clubbing. Neuro: Alert, answering all questions appropriately. Cranial nerves grossly intact. Skin: No rashes or petechiae noted. Psych: Normal affect.    LAB RESULTS:  Lab Results  Component Value Date   NA 137 10/17/2014   K 3.0* 10/17/2014   CL 92* 10/17/2014   CO2 34* 10/17/2014   GLUCOSE 188* 10/17/2014   BUN 16 10/17/2014   CREATININE 0.72 10/17/2014   CALCIUM 8.2* 10/17/2014   PROT 7.2 05/11/2014   ALBUMIN 2.8* 05/11/2014   AST 15 05/11/2014   ALT 19 05/11/2014   ALKPHOS 62 05/11/2014   GFRNONAA >60 10/17/2014   GFRAA >60 10/17/2014    Lab Results  Component Value Date   WBC 6.8 12/13/2014   NEUTROABS 3.8 12/13/2014   HGB 9.5* 12/13/2014   HCT 30.9* 12/13/2014   MCV 84.7 12/13/2014   PLT 481* 12/13/2014     STUDIES: Dg Foot Complete Right  Jan 02, 2015   CLINICAL DATA:  Fall 1 week ago, pain at the base of the fifth metatarsal  EXAM: RIGHT FOOT COMPLETE - 3+ VIEW  COMPARISON:  None.  FINDINGS: Bones are subjectively osteopenic. Midfoot degenerative change noted. There is a fracture at the head of the fourth metatarsal. Mild apex dorsal angulation is present. No radiopaque foreign body. Vascular calcifications are present.  There is also linear a trabecular  discontinuity at the head of the third metatarsal which could represent nondisplaced fracture but is only seen on 1 projection.  IMPRESSION: Fracture at the head of the fourth metatarsal. Subjective osteopenia.   Electronically Signed   By: Conchita Paris M.D.   On: 02-Jan-2015 16:49    ASSESSMENT: Iron deficiency anemia.  PLAN:   1. Iron deficiency anemia: Patient's hemoglobin and iron stores have trended down and she will benefit from 510 mg IV Feraheme today. Return to clinic in 1 week for a second infusion. Patient will then return to clinic in 3 months for repeat  laboratory work, further evaluation, and consideration of additional IV Feraheme. Patient and her son expressed understanding and were in agreement with this plan.   Lloyd Huger, MD   12/30/2014 7:22 PM

## 2015-01-07 ENCOUNTER — Other Ambulatory Visit: Payer: Self-pay

## 2015-01-07 ENCOUNTER — Telehealth: Payer: Self-pay | Admitting: Family Medicine

## 2015-01-07 MED ORDER — GABAPENTIN 400 MG PO CAPS: 400.0000 mg | ORAL_CAPSULE | Freq: Two times a day (BID) | ORAL | Status: DC

## 2015-01-07 MED ORDER — PANTOPRAZOLE SODIUM 40 MG PO TBEC: 40.0000 mg | DELAYED_RELEASE_TABLET | Freq: Two times a day (BID) | ORAL | Status: DC

## 2015-01-07 NOTE — Telephone Encounter (Signed)
E-Fax came through for refill: Rx: Pantoprazole Sodium 40mg  Rx in basket

## 2015-01-07 NOTE — Telephone Encounter (Signed)
Pharmacy requesting refill. [facility]

## 2015-01-07 NOTE — Telephone Encounter (Signed)
E-Fax came through for refill: Rx: Gabapentin 400 mg Rx in basket

## 2015-01-09 ENCOUNTER — Telehealth: Payer: Self-pay | Admitting: Family Medicine

## 2015-01-10 ENCOUNTER — Telehealth: Payer: Self-pay

## 2015-01-10 NOTE — Telephone Encounter (Signed)
Spoke with Patients son, she is now just starting the Levothyroxine 180mcg. She has enough medication for now, she will return in 6wks for blood work.

## 2015-01-10 NOTE — Telephone Encounter (Signed)
  Received a refill request from [facility], Levothyroxine 164mcg. Therapist, nutritional, patinets medication was increased to 135mcg,will discuss this with son when he returns my call.

## 2015-01-17 ENCOUNTER — Other Ambulatory Visit: Payer: Self-pay

## 2015-01-17 MED ORDER — DONEPEZIL HCL 10 MG PO TABS: 10.0000 mg | ORAL_TABLET | Freq: Every day | ORAL | Status: DC

## 2015-01-17 MED ORDER — GABAPENTIN 400 MG PO CAPS: 400.0000 mg | ORAL_CAPSULE | Freq: Two times a day (BID) | ORAL | Status: DC

## 2015-01-17 NOTE — Telephone Encounter (Signed)
Both of these medications were being requested via fax, I ordered both of them, please review and sign.

## 2015-01-28 LAB — HM DIABETES EYE EXAM

## 2015-02-05 ENCOUNTER — Telehealth: Payer: Self-pay | Admitting: Family Medicine

## 2015-02-05 ENCOUNTER — Other Ambulatory Visit: Payer: Self-pay | Admitting: Family Medicine

## 2015-02-05 MED ORDER — LORAZEPAM 0.5 MG PO TABS: 0.5000 mg | ORAL_TABLET | Freq: Every day | ORAL | Status: DC

## 2015-02-05 NOTE — Telephone Encounter (Signed)
Called into [facility].

## 2015-02-05 NOTE — Telephone Encounter (Signed)
Pt's caregiver called stated pt needs a new RX for metformin. Pharm is [facility] in [place]. Thanks.

## 2015-02-05 NOTE — Telephone Encounter (Signed)
Rx filled. OK to call in

## 2015-02-05 NOTE — Telephone Encounter (Signed)
Called and spoke with care provider, patient should have enough medication for 3 months. 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

## 2015-02-15 ENCOUNTER — Telehealth: Payer: Self-pay | Admitting: Family Medicine

## 2015-02-15 NOTE — Telephone Encounter (Signed)
Patient is to return to our office to have blood work done. Pharmacy notified. Care provider notified.

## 2015-02-15 NOTE — Telephone Encounter (Signed)
E-Fax came through for refill on: Rx: Levothyroxine sodium 167mcg tab#30 Copy in basket.

## 2015-02-18 ENCOUNTER — Telehealth: Payer: Self-pay | Admitting: Family Medicine

## 2015-02-18 ENCOUNTER — Other Ambulatory Visit: Payer: Self-pay

## 2015-02-18 NOTE — Telephone Encounter (Signed)
Pt wants to come in tomorrow to have her thyroid level checked. Please advise. Thanks.

## 2015-02-18 NOTE — Telephone Encounter (Signed)
Called, spoke with care provider, scheduled an appointment for tomorrow for labs.

## 2015-02-19 ENCOUNTER — Other Ambulatory Visit: Payer: Medicare Other

## 2015-02-19 DIAGNOSIS — E038 Other specified hypothyroidism: Secondary | ICD-10-CM

## 2015-02-20 ENCOUNTER — Telehealth: Payer: Self-pay | Admitting: Family Medicine

## 2015-02-20 LAB — TSH: TSH: 6.88 u[IU]/mL — ABNORMAL HIGH (ref 0.450–4.500)

## 2015-02-20 MED ORDER — LEVOTHYROXINE SODIUM 150 MCG PO TABS: 150.0000 ug | ORAL_TABLET | Freq: Every day | ORAL | Status: DC

## 2015-02-20 NOTE — Telephone Encounter (Signed)
Called and notified careprovider. Appointment scheduled.

## 2015-02-20 NOTE — Telephone Encounter (Signed)
Please let Letta Median or Nicole Kindred know that Lorre's TSH was still high. Sending in the next dose, should come in in 6 weeks for recheck (make appt if you can). Doug's labs were also normal.

## 2015-02-25 LAB — APTT: Activated PTT: 26.7 secs (ref 23.6–35.9)

## 2015-02-25 LAB — TROPONIN I: Troponin-I: <0.03 ng/mL

## 2015-02-25 LAB — COMPREHENSIVE METABOLIC PANEL
ANION GAP: 11 (ref 7–16)
AST: 23 U/L
Albumin: 3.1 g/dL — ABNORMAL LOW
Alkaline Phosphatase: 39 U/L
BUN: 23 mg/dL — AB
Bilirubin,Total: 0.6 mg/dL
CHLORIDE: 92 mmol/L — AB
CO2: 29 mmol/L
CREATININE: 1.07 mg/dL — AB
Calcium, Total: 7.9 mg/dL — ABNORMAL LOW
GFR CALC AF AMER: 56 — AB
GFR CALC NON AF AMER: 48 — AB
GLUCOSE: 136 mg/dL — AB
Potassium: 3.3 mmol/L — ABNORMAL LOW
SGPT (ALT): 16 U/L
Sodium: 132 mmol/L — ABNORMAL LOW
Total Protein: 6.5 g/dL

## 2015-02-25 LAB — CBC
HCT: 29.8 % — AB (ref 35.0–47.0)
HGB: 9 g/dL — AB (ref 12.0–16.0)
MCH: 26 pg (ref 26.0–34.0)
MCHC: 30.2 g/dL — AB (ref 32.0–36.0)
MCV: 86 fL (ref 80–100)
Platelet: 279 10*3/uL (ref 150–440)
RBC: 3.46 10*6/uL — ABNORMAL LOW (ref 3.80–5.20)
RDW: 26.3 % — AB (ref 11.5–14.5)
WBC: 17.3 10*3/uL — AB (ref 3.6–11.0)

## 2015-02-25 LAB — PROTIME-INR
INR: 1.7
Prothrombin Time: 20.3 secs — ABNORMAL HIGH

## 2015-02-25 LAB — CK TOTAL AND CKMB (NOT AT ARMC)
CK, TOTAL: 34 U/L — AB
CK-MB: 1.1 ng/mL

## 2015-02-25 LAB — PRO B NATRIURETIC PEPTIDE: B-TYPE NATIURETIC PEPTID: 501 pg/mL — AB

## 2015-03-14 ENCOUNTER — Ambulatory Visit: Payer: Medicare Other

## 2015-03-14 ENCOUNTER — Other Ambulatory Visit: Payer: Medicare Other

## 2015-03-14 ENCOUNTER — Ambulatory Visit: Payer: Medicare Other | Admitting: Oncology

## 2015-03-14 ENCOUNTER — Telehealth: Payer: Self-pay

## 2015-03-14 DIAGNOSIS — J449 Chronic obstructive pulmonary disease, unspecified: Secondary | ICD-10-CM

## 2015-03-14 MED ORDER — FLUTICASONE-SALMETEROL 250-50 MCG/DOSE IN AEPB: 1.0000 | INHALATION_SPRAY | Freq: Two times a day (BID) | RESPIRATORY_TRACT | Status: DC

## 2015-03-14 NOTE — Telephone Encounter (Signed)
Refill on Advair, verbal ok by Dr.Johnson. Order placed and sent to [facility]

## 2015-03-19 ENCOUNTER — Inpatient Hospital Stay: Payer: Medicare Other

## 2015-03-19 ENCOUNTER — Inpatient Hospital Stay: Payer: Medicare Other | Attending: Oncology | Admitting: Oncology

## 2015-03-19 VITALS — BP 144/74 | HR 65 | Temp 97.8°F | Resp 20

## 2015-03-19 DIAGNOSIS — I10 Essential (primary) hypertension: Secondary | ICD-10-CM | POA: Diagnosis not present

## 2015-03-19 DIAGNOSIS — R5383 Other fatigue: Secondary | ICD-10-CM

## 2015-03-19 DIAGNOSIS — D649 Anemia, unspecified: Secondary | ICD-10-CM

## 2015-03-19 DIAGNOSIS — R0602 Shortness of breath: Secondary | ICD-10-CM | POA: Diagnosis not present

## 2015-03-19 DIAGNOSIS — D509 Iron deficiency anemia, unspecified: Secondary | ICD-10-CM | POA: Diagnosis present

## 2015-03-19 DIAGNOSIS — F329 Major depressive disorder, single episode, unspecified: Secondary | ICD-10-CM | POA: Diagnosis not present

## 2015-03-19 DIAGNOSIS — E875 Hyperkalemia: Secondary | ICD-10-CM | POA: Diagnosis not present

## 2015-03-19 DIAGNOSIS — R531 Weakness: Secondary | ICD-10-CM

## 2015-03-19 DIAGNOSIS — Z79899 Other long term (current) drug therapy: Secondary | ICD-10-CM | POA: Diagnosis not present

## 2015-03-19 DIAGNOSIS — E039 Hypothyroidism, unspecified: Secondary | ICD-10-CM

## 2015-03-19 DIAGNOSIS — E119 Type 2 diabetes mellitus without complications: Secondary | ICD-10-CM

## 2015-03-19 DIAGNOSIS — E785 Hyperlipidemia, unspecified: Secondary | ICD-10-CM

## 2015-03-19 LAB — CBC WITH DIFFERENTIAL/PLATELET
Basophils Absolute: 0.1 10*3/uL (ref 0–0.1)
Basophils Relative: 1 %
EOS ABS: 0.1 10*3/uL (ref 0–0.7)
EOS PCT: 2 %
HCT: 32.7 % — ABNORMAL LOW (ref 35.0–47.0)
Hemoglobin: 10.6 g/dL — ABNORMAL LOW (ref 12.0–16.0)
LYMPHS ABS: 1.8 10*3/uL (ref 1.0–3.6)
Lymphocytes Relative: 27 %
MCH: 29 pg (ref 26.0–34.0)
MCHC: 32.3 g/dL (ref 32.0–36.0)
MCV: 89.9 fL (ref 80.0–100.0)
MONO ABS: 1 10*3/uL — AB (ref 0.2–0.9)
MONOS PCT: 16 %
Neutro Abs: 3.6 10*3/uL (ref 1.4–6.5)
Neutrophils Relative %: 54 %
PLATELETS: 436 10*3/uL (ref 150–440)
RBC: 3.64 MIL/uL — ABNORMAL LOW (ref 3.80–5.20)
RDW: 15 % — AB (ref 11.5–14.5)
WBC: 6.5 10*3/uL (ref 3.6–11.0)

## 2015-03-19 LAB — IRON AND TIBC
IRON: 172 ug/dL — AB (ref 28–170)
Saturation Ratios: 42 % — ABNORMAL HIGH (ref 10.4–31.8)
TIBC: 412 ug/dL (ref 250–450)
UIBC: 240 ug/dL

## 2015-03-19 LAB — FERRITIN: Ferritin: 25 ng/mL (ref 11–307)

## 2015-03-19 NOTE — Progress Notes (Signed)
Patient here today for ongoing follow up regarding anemia. Patient denies any concerns today.

## 2015-03-21 ENCOUNTER — Other Ambulatory Visit: Payer: Self-pay | Admitting: Family Medicine

## 2015-03-21 ENCOUNTER — Telehealth: Payer: Self-pay | Admitting: Family Medicine

## 2015-03-21 NOTE — Telephone Encounter (Signed)
Spoke to Caro at his dad's appointment. They already got that filled by Dr. Ubaldo Glassing.

## 2015-03-21 NOTE — Telephone Encounter (Signed)
Please let her son Nicole Kindred) or her caregiver Letta Median) that we got a refill request from [facility] for her amiodarone- she needs to get this from cardiology.

## 2015-03-21 NOTE — Telephone Encounter (Signed)
error 

## 2015-03-24 NOTE — Progress Notes (Signed)
[place]  Telephone:([phone]) [phone] [phone]) [phone]  ID: Theresa Vasquez OB: 23-Jan-1931  MR#: [id number]  PIR#:[id number]  Patient Care Team: Valerie Roys, DO as PCP - General (Family Medicine)  CHIEF COMPLAINT:  Chief Complaint  Patient presents with  . Follow-up    anemia    INTERVAL HISTORY: Patient returns to clinic today for repeat laboratory work and further evaluation. She continues to have chronic weakness and fatigue. She has no neurologic complaints. She denies any fevers. She has a good appetite and denies weight loss. She has no chest pain, but is chronically short of breath and requires oxygen. She denies any nausea, vomiting, constipation, or diarrhea.  She has no urinary complaints. Patient otherwise feels well and offers no further specific complaints.   REVIEW OF SYSTEMS:   Review of Systems  Constitutional: Positive for malaise/fatigue.  Respiratory: Positive for shortness of breath.   Cardiovascular: Negative.   Musculoskeletal:       Foot pain.  Neurological: Positive for weakness.    As per HPI. Otherwise, a complete review of systems is negatve.  PAST MEDICAL HISTORY: Past Medical History  Diagnosis Date  . Anemia   . Diabetes mellitus without complication   . Depression   . Hypertension   . Colitis   . Diverticulitis   . Hypothyroidism   . Hyperlipidemia     PAST SURGICAL HISTORY: Past Surgical History  Procedure Laterality Date  . Abdominal hysterectomy      partial    FAMILY HISTORY: Diabetes.     ADVANCED DIRECTIVES:    HEALTH MAINTENANCE: Social History  Substance Use Topics  . Smoking status: Not on file  . Smokeless tobacco: Not on file  . Alcohol Use: Not on file     Colonoscopy:  PAP:  Bone density:  Lipid panel:  No Known Allergies  Current Outpatient Prescriptions  Medication Sig Dispense Refill  . albuterol (PROVENTIL) (2.5 MG/3ML) 0.083% nebulizer solution Take 2.5 mg by  nebulization every 4 (four) hours as needed for wheezing or shortness of breath.    Marland Kitchen amiodarone (PACERONE) 200 MG tablet Take 200 mg by mouth daily. Take in the morning    . apixaban (ELIQUIS) 5 MG TABS tablet Take 5 mg by mouth 2 (two) times daily.    . divalproex (DEPAKOTE SPRINKLE) 125 MG capsule Take 125 mg by mouth 3 (three) times daily. Take 2 capsules 3 times a day    . donepezil (ARICEPT) 10 MG tablet Take 10 mg by mouth at bedtime.    . donepezil (ARICEPT) 10 MG tablet Take 1 tablet (10 mg total) by mouth at bedtime. 30 tablet 3  . enalapril (VASOTEC) 2.5 MG tablet Take 2.5 mg by mouth 2 (two) times daily. Take 2 tabs 2 times a day.    . famotidine (PEPCID) 20 MG tablet Take 20 mg by mouth 2 (two) times daily.    . ferrous sulfate 325 (65 FE) MG tablet Take 325 mg by mouth daily with breakfast.    . Fluticasone-Salmeterol (ADVAIR) 250-50 MCG/DOSE AEPB Inhale 1 puff into the lungs 2 (two) times daily.    . Fluticasone-Salmeterol (ADVAIR) 250-50 MCG/DOSE AEPB Inhale 1 puff into the lungs 2 (two) times daily. 1 puff 2 times a day 60 each 12  . furosemide (LASIX) 40 MG tablet Take 40 mg by mouth 2 (two) times daily.    Marland Kitchen gabapentin (NEURONTIN) 400 MG capsule Take 1 capsule (400 mg total) by mouth 2 (two) times daily.  60 capsule 3  . levothyroxine (SYNTHROID, LEVOTHROID) 150 MCG tablet Take 1 tablet (150 mcg total) by mouth daily before breakfast. Please have her come in for recheck in 6 weeks. 30 tablet 1  . LORazepam (ATIVAN) 0.5 MG tablet Take 1 tablet (0.5 mg total) by mouth daily. 30 tablet 2  . Melatonin 3 MG TABS Take 1 tablet by mouth at bedtime.    . metFORMIN (GLUCOPHAGE) 500 MG tablet Take 0.5 tablets (250 mg total) by mouth daily with breakfast. 45 tablet 1  . metoprolol (LOPRESSOR) 50 MG tablet Take 50 mg by mouth 2 (two) times daily. Take 1/2 tablet orally 2 times a day    . pantoprazole (PROTONIX) 40 MG tablet Take 1 tablet (40 mg total) by mouth 2 (two) times daily. 60 tablet 3   . Potassium Chloride ER 20 MEQ TBCR Take 1 tablet by mouth 2 (two) times daily.    Marland Kitchen tiotropium (SPIRIVA) 18 MCG inhalation capsule Place 18 mcg into inhaler and inhale daily.    . traMADol (ULTRAM) 50 MG tablet Take 50 mg by mouth every 6 (six) hours as needed.    . venlafaxine XR (EFFEXOR XR) 37.5 MG 24 hr capsule Take 1 capsule (37.5 mg total) by mouth daily with breakfast. 30 capsule 6   No current facility-administered medications for this visit.    OBJECTIVE: Filed Vitals:   03/19/15 1542  BP: 144/74  Pulse: 65  Temp: 97.8 F (36.6 C)  Resp: 20     There is no weight on file to calculate BMI.    ECOG FS:2 - Symptomatic, <50% confined to bed  General: Well-developed, well-nourished, no acute distress. Eyes: anicteric sclera. Lungs: Clear to auscultation bilaterally. Heart: Regular rate and rhythm. No rubs, murmurs, or gallops. Abdomen: Soft, nontender, nondistended. No organomegaly noted, normoactive bowel sounds. Musculoskeletal: No edema, cyanosis, or clubbing. Neuro: Alert, answering all questions appropriately. Cranial nerves grossly intact. Skin: No rashes or petechiae noted. Psych: Normal affect.    LAB RESULTS:  Lab Results  Component Value Date   NA 137 10/17/2014   K 3.0* 10/17/2014   CL 92* 10/17/2014   CO2 34* 10/17/2014   GLUCOSE 188* 10/17/2014   BUN 16 10/17/2014   CREATININE 0.72 10/17/2014   CALCIUM 8.2* 10/17/2014   PROT 6.5 10/10/2014   ALBUMIN 3.1* 10/10/2014   AST 23 10/10/2014   ALT 16 10/10/2014   ALKPHOS 39 10/10/2014   BILITOT 0.6 10/10/2014   GFRNONAA >60 10/17/2014   GFRAA >60 10/17/2014    Lab Results  Component Value Date   WBC 6.5 03/19/2015   NEUTROABS 3.6 03/19/2015   HGB 10.6* 03/19/2015   HCT 32.7* 03/19/2015   MCV 89.9 03/19/2015   PLT 436 03/19/2015     STUDIES: No results found.  ASSESSMENT: Iron deficiency anemia.  PLAN:   1. Iron deficiency anemia: Patient's hemoglobin and iron stores have improved  after receiving 2 infusions of 510 mg IV Feraheme in June 2016. No intervention is needed at this time.  Return to clinic in 3 months for repeat laboratory work, further evaluation, and consideration of additional IV Feraheme. Patient and her son expressed understanding and were in agreement with this plan.   Lloyd Huger, MD   03/24/2015 9:20 AM

## 2015-04-02 ENCOUNTER — Ambulatory Visit: Payer: Self-pay | Admitting: Family Medicine

## 2015-04-11 ENCOUNTER — Telehealth: Payer: Self-pay

## 2015-04-11 NOTE — Telephone Encounter (Signed)
Needs thyroid rechecked. Then will get refill of correct meds- please tell Faye/Tony

## 2015-04-11 NOTE — Telephone Encounter (Signed)
Levothyroxine 156mcg Daily

## 2015-04-11 NOTE — Telephone Encounter (Signed)
Care provider notified.

## 2015-04-23 ENCOUNTER — Telehealth: Payer: Self-pay

## 2015-04-23 MED ORDER — LEVOTHYROXINE SODIUM 150 MCG PO TABS: 150.0000 ug | ORAL_TABLET | Freq: Every day | ORAL | Status: DC

## 2015-04-23 NOTE — Telephone Encounter (Signed)
[facility] requesting refill for  Levothyroxine Sodium 123mcg  1tab qd  Patient may need appointment for labs

## 2015-05-01 ENCOUNTER — Telehealth: Payer: Self-pay | Admitting: Family Medicine

## 2015-05-01 ENCOUNTER — Other Ambulatory Visit: Payer: Medicare Other

## 2015-05-01 DIAGNOSIS — E039 Hypothyroidism, unspecified: Secondary | ICD-10-CM

## 2015-05-01 NOTE — Telephone Encounter (Signed)
Pt is here was expecting to have thyroid labs, I do not see anything on under the orders tab. Can this be ordered so pt can added to the lab schedule. Please advise. Thanks

## 2015-05-02 LAB — TSH: TSH: 2.78 u[IU]/mL (ref 0.450–4.500)

## 2015-05-02 MED ORDER — LEVOTHYROXINE SODIUM 150 MCG PO TABS: 150.0000 ug | ORAL_TABLET | Freq: Every day | ORAL | Status: DC

## 2015-05-02 NOTE — Telephone Encounter (Signed)
Please let them know that her thyroid was normal! YAY!! A years supply of the medicine was sent to her pharmacy and we don't need them to come back for a blood test until next year, but we'll need to see her for her regular appointments between then.

## 2015-05-02 NOTE — Telephone Encounter (Signed)
Left voicemail for patients son or caregiver.

## 2015-05-13 ENCOUNTER — Other Ambulatory Visit: Payer: Self-pay

## 2015-05-13 NOTE — Telephone Encounter (Addendum)
PATIENT: Theresa Vasquez Patient has canceled last office visits here, but patient is in and out of the cancer center in chart review.  Patient requests divalproex sodium dr 250mg  tab # 90. The DEPAKOTE SPRINKLE is "no longer available" So I had to create a new order for this medication. Enalapril Maleate 5mg  tab # 60. Metformin HCL 500mg  tab # 45 Gabapentin 400mg  cap # 60

## 2015-05-13 NOTE — Telephone Encounter (Signed)
Needs an appointment. Will get her enough medicine to make it to appointment when it's booked.   

## 2015-05-13 NOTE — Telephone Encounter (Signed)
No answer, left voicemail to notify patient's care givers that enough medication would be sent through when a appointment is scheduled.

## 2015-05-14 ENCOUNTER — Ambulatory Visit: Payer: Medicare Other | Admitting: Family Medicine

## 2015-05-14 ENCOUNTER — Inpatient Hospital Stay
Admission: EM | Admit: 2015-05-14 | Discharge: 2015-05-18 | DRG: 811 | Disposition: A | Discharge status: Home Health | Payer: Medicare Other | Attending: Internal Medicine | Admitting: Internal Medicine

## 2015-05-14 ENCOUNTER — Ambulatory Visit (INDEPENDENT_AMBULATORY_CARE_PROVIDER_SITE_OTHER): Payer: Medicare Other | Admitting: Family Medicine

## 2015-05-14 ENCOUNTER — Encounter: Payer: Self-pay | Admitting: Internal Medicine

## 2015-05-14 ENCOUNTER — Inpatient Hospital Stay: Payer: Medicare Other

## 2015-05-14 ENCOUNTER — Encounter: Payer: Self-pay | Admitting: Family Medicine

## 2015-05-14 VITALS — BP 99/59 | HR 73 | Temp 98.6°F

## 2015-05-14 DIAGNOSIS — K921 Melena: Secondary | ICD-10-CM | POA: Diagnosis present

## 2015-05-14 DIAGNOSIS — Z8701 Personal history of pneumonia (recurrent): Secondary | ICD-10-CM

## 2015-05-14 DIAGNOSIS — Z833 Family history of diabetes mellitus: Secondary | ICD-10-CM | POA: Diagnosis not present

## 2015-05-14 DIAGNOSIS — Z87891 Personal history of nicotine dependence: Secondary | ICD-10-CM

## 2015-05-14 DIAGNOSIS — F1920 Other psychoactive substance dependence, uncomplicated: Secondary | ICD-10-CM | POA: Insufficient documentation

## 2015-05-14 DIAGNOSIS — I739 Peripheral vascular disease, unspecified: Secondary | ICD-10-CM | POA: Insufficient documentation

## 2015-05-14 DIAGNOSIS — K2270 Barrett's esophagus without dysplasia: Secondary | ICD-10-CM | POA: Insufficient documentation

## 2015-05-14 DIAGNOSIS — I5032 Chronic diastolic (congestive) heart failure: Secondary | ICD-10-CM | POA: Insufficient documentation

## 2015-05-14 DIAGNOSIS — F0390 Unspecified dementia without behavioral disturbance: Secondary | ICD-10-CM | POA: Diagnosis present

## 2015-05-14 DIAGNOSIS — I110 Hypertensive heart disease with heart failure: Secondary | ICD-10-CM | POA: Diagnosis present

## 2015-05-14 DIAGNOSIS — K5792 Diverticulitis of intestine, part unspecified, without perforation or abscess without bleeding: Secondary | ICD-10-CM | POA: Insufficient documentation

## 2015-05-14 DIAGNOSIS — D75839 Thrombocytosis, unspecified: Secondary | ICD-10-CM | POA: Insufficient documentation

## 2015-05-14 DIAGNOSIS — D509 Iron deficiency anemia, unspecified: Secondary | ICD-10-CM | POA: Diagnosis present

## 2015-05-14 DIAGNOSIS — T45525A Adverse effect of antithrombotic drugs, initial encounter: Secondary | ICD-10-CM | POA: Diagnosis present

## 2015-05-14 DIAGNOSIS — R0902 Hypoxemia: Secondary | ICD-10-CM | POA: Insufficient documentation

## 2015-05-14 DIAGNOSIS — F0151 Vascular dementia with behavioral disturbance: Secondary | ICD-10-CM | POA: Insufficient documentation

## 2015-05-14 DIAGNOSIS — E039 Hypothyroidism, unspecified: Secondary | ICD-10-CM | POA: Diagnosis present

## 2015-05-14 DIAGNOSIS — Z7902 Long term (current) use of antithrombotics/antiplatelets: Secondary | ICD-10-CM | POA: Diagnosis not present

## 2015-05-14 DIAGNOSIS — E119 Type 2 diabetes mellitus without complications: Secondary | ICD-10-CM | POA: Diagnosis present

## 2015-05-14 DIAGNOSIS — R0989 Other specified symptoms and signs involving the circulatory and respiratory systems: Secondary | ICD-10-CM | POA: Insufficient documentation

## 2015-05-14 DIAGNOSIS — J189 Pneumonia, unspecified organism: Secondary | ICD-10-CM | POA: Diagnosis not present

## 2015-05-14 DIAGNOSIS — J449 Chronic obstructive pulmonary disease, unspecified: Secondary | ICD-10-CM | POA: Insufficient documentation

## 2015-05-14 DIAGNOSIS — N182 Chronic kidney disease, stage 2 (mild): Secondary | ICD-10-CM

## 2015-05-14 DIAGNOSIS — R531 Weakness: Secondary | ICD-10-CM

## 2015-05-14 DIAGNOSIS — M810 Age-related osteoporosis without current pathological fracture: Secondary | ICD-10-CM | POA: Insufficient documentation

## 2015-05-14 DIAGNOSIS — K922 Gastrointestinal hemorrhage, unspecified: Secondary | ICD-10-CM | POA: Insufficient documentation

## 2015-05-14 DIAGNOSIS — F419 Anxiety disorder, unspecified: Secondary | ICD-10-CM | POA: Diagnosis present

## 2015-05-14 DIAGNOSIS — I700 Atherosclerosis of aorta: Secondary | ICD-10-CM | POA: Insufficient documentation

## 2015-05-14 DIAGNOSIS — E785 Hyperlipidemia, unspecified: Secondary | ICD-10-CM | POA: Diagnosis present

## 2015-05-14 DIAGNOSIS — I130 Hypertensive heart and chronic kidney disease with heart failure and stage 1 through stage 4 chronic kidney disease, or unspecified chronic kidney disease: Secondary | ICD-10-CM | POA: Insufficient documentation

## 2015-05-14 DIAGNOSIS — D473 Essential (hemorrhagic) thrombocythemia: Secondary | ICD-10-CM

## 2015-05-14 DIAGNOSIS — I129 Hypertensive chronic kidney disease with stage 1 through stage 4 chronic kidney disease, or unspecified chronic kidney disease: Secondary | ICD-10-CM | POA: Insufficient documentation

## 2015-05-14 DIAGNOSIS — S92344A Nondisplaced fracture of fourth metatarsal bone, right foot, initial encounter for closed fracture: Secondary | ICD-10-CM | POA: Insufficient documentation

## 2015-05-14 DIAGNOSIS — K22719 Barrett's esophagus with dysplasia, unspecified: Secondary | ICD-10-CM

## 2015-05-14 DIAGNOSIS — E1142 Type 2 diabetes mellitus with diabetic polyneuropathy: Secondary | ICD-10-CM

## 2015-05-14 DIAGNOSIS — I447 Left bundle-branch block, unspecified: Secondary | ICD-10-CM | POA: Insufficient documentation

## 2015-05-14 DIAGNOSIS — I340 Nonrheumatic mitral (valve) insufficiency: Secondary | ICD-10-CM

## 2015-05-14 DIAGNOSIS — I509 Heart failure, unspecified: Secondary | ICD-10-CM | POA: Insufficient documentation

## 2015-05-14 DIAGNOSIS — F0150 Vascular dementia without behavioral disturbance: Secondary | ICD-10-CM | POA: Insufficient documentation

## 2015-05-14 DIAGNOSIS — D62 Acute posthemorrhagic anemia: Principal | ICD-10-CM | POA: Diagnosis present

## 2015-05-14 DIAGNOSIS — F411 Generalized anxiety disorder: Secondary | ICD-10-CM

## 2015-05-14 DIAGNOSIS — E1122 Type 2 diabetes mellitus with diabetic chronic kidney disease: Secondary | ICD-10-CM | POA: Insufficient documentation

## 2015-05-14 DIAGNOSIS — H9190 Unspecified hearing loss, unspecified ear: Secondary | ICD-10-CM | POA: Diagnosis present

## 2015-05-14 DIAGNOSIS — Z7984 Long term (current) use of oral hypoglycemic drugs: Secondary | ICD-10-CM | POA: Diagnosis not present

## 2015-05-14 DIAGNOSIS — F329 Major depressive disorder, single episode, unspecified: Secondary | ICD-10-CM | POA: Diagnosis present

## 2015-05-14 DIAGNOSIS — R197 Diarrhea, unspecified: Secondary | ICD-10-CM

## 2015-05-14 DIAGNOSIS — Z79899 Other long term (current) drug therapy: Secondary | ICD-10-CM | POA: Diagnosis not present

## 2015-05-14 DIAGNOSIS — K5790 Diverticulosis of intestine, part unspecified, without perforation or abscess without bleeding: Secondary | ICD-10-CM | POA: Insufficient documentation

## 2015-05-14 DIAGNOSIS — I350 Nonrheumatic aortic (valve) stenosis: Secondary | ICD-10-CM | POA: Diagnosis not present

## 2015-05-14 DIAGNOSIS — Z9981 Dependence on supplemental oxygen: Secondary | ICD-10-CM | POA: Insufficient documentation

## 2015-05-14 DIAGNOSIS — I480 Paroxysmal atrial fibrillation: Secondary | ICD-10-CM | POA: Diagnosis present

## 2015-05-14 DIAGNOSIS — D649 Anemia, unspecified: Secondary | ICD-10-CM

## 2015-05-14 DIAGNOSIS — K5791 Diverticulosis of intestine, part unspecified, without perforation or abscess with bleeding: Secondary | ICD-10-CM

## 2015-05-14 DIAGNOSIS — R0602 Shortness of breath: Secondary | ICD-10-CM

## 2015-05-14 DIAGNOSIS — J9601 Acute respiratory failure with hypoxia: Secondary | ICD-10-CM | POA: Diagnosis not present

## 2015-05-14 DIAGNOSIS — R609 Edema, unspecified: Secondary | ICD-10-CM | POA: Insufficient documentation

## 2015-05-14 DIAGNOSIS — S92344S Nondisplaced fracture of fourth metatarsal bone, right foot, sequela: Secondary | ICD-10-CM

## 2015-05-14 DIAGNOSIS — R269 Unspecified abnormalities of gait and mobility: Secondary | ICD-10-CM

## 2015-05-14 DIAGNOSIS — F01518 Vascular dementia, unspecified severity, with other behavioral disturbance: Secondary | ICD-10-CM | POA: Insufficient documentation

## 2015-05-14 DIAGNOSIS — K559 Vascular disorder of intestine, unspecified: Secondary | ICD-10-CM | POA: Insufficient documentation

## 2015-05-14 DIAGNOSIS — T8089XA Other complications following infusion, transfusion and therapeutic injection, initial encounter: Secondary | ICD-10-CM | POA: Diagnosis not present

## 2015-05-14 DIAGNOSIS — F1921 Other psychoactive substance dependence, in remission: Secondary | ICD-10-CM | POA: Insufficient documentation

## 2015-05-14 DIAGNOSIS — Z8711 Personal history of peptic ulcer disease: Secondary | ICD-10-CM | POA: Insufficient documentation

## 2015-05-14 HISTORY — DX: Unspecified dementia, unspecified severity, without behavioral disturbance, psychotic disturbance, mood disturbance, and anxiety: F03.90

## 2015-05-14 HISTORY — DX: Heart failure, unspecified: I50.9

## 2015-05-14 HISTORY — DX: Peripheral vascular disease, unspecified: I73.9

## 2015-05-14 HISTORY — DX: Paroxysmal atrial fibrillation: I48.0

## 2015-05-14 HISTORY — DX: Iron deficiency anemia, unspecified: D50.9

## 2015-05-14 LAB — COMPREHENSIVE METABOLIC PANEL
ALT: 11 U/L — ABNORMAL LOW (ref 14–54)
ANION GAP: 11 (ref 5–15)
AST: 19 U/L (ref 15–41)
Albumin: 2.9 g/dL — ABNORMAL LOW (ref 3.5–5.0)
Alkaline Phosphatase: 47 U/L (ref 38–126)
BUN: 17 mg/dL (ref 6–20)
CO2: 27 mmol/L (ref 22–32)
Calcium: 7.9 mg/dL — ABNORMAL LOW (ref 8.9–10.3)
Chloride: 102 mmol/L (ref 101–111)
Creatinine, Ser: 0.94 mg/dL (ref 0.44–1.00)
GFR calc Af Amer: >60 mL/min (ref >60)
GFR calc non Af Amer: 55 mL/min — ABNORMAL LOW (ref >60)
Glucose, Bld: 153 mg/dL — ABNORMAL HIGH (ref 65–99)
POTASSIUM: 3.8 mmol/L (ref 3.5–5.1)
SODIUM: 140 mmol/L (ref 135–145)
TOTAL PROTEIN: 6 g/dL — AB (ref 6.5–8.1)
Total Bilirubin: <0.1 mg/dL — ABNORMAL LOW (ref 0.3–1.2)

## 2015-05-14 LAB — CBC WITH DIFFERENTIAL/PLATELET
BASOS PCT: 1 %
Basophils Absolute: 0 10*3/uL (ref 0–0.1)
Eosinophils Absolute: 0.2 10*3/uL (ref 0–0.7)
Eosinophils Relative: 2 %
HEMATOCRIT: 16.5 % — AB (ref 35.0–47.0)
HEMOGLOBIN: 5 g/dL — AB (ref 12.0–16.0)
LYMPHS ABS: 1.2 10*3/uL (ref 1.0–3.6)
Lymphocytes Relative: 17 %
MCH: 27.6 pg (ref 26.0–34.0)
MCHC: 30.1 g/dL — AB (ref 32.0–36.0)
MCV: 91.5 fL (ref 80.0–100.0)
MONOS PCT: 15 %
Monocytes Absolute: 1.1 10*3/uL — ABNORMAL HIGH (ref 0.2–0.9)
NEUTROS ABS: 4.7 10*3/uL (ref 1.4–6.5)
NEUTROS PCT: 65 %
Platelets: 400 10*3/uL (ref 150–440)
RBC: 1.8 MIL/uL — AB (ref 3.80–5.20)
RDW: 15.4 % — ABNORMAL HIGH (ref 11.5–14.5)
WBC: 7.2 10*3/uL (ref 3.6–11.0)

## 2015-05-14 LAB — ABO/RH: ABO/RH(D): O POS

## 2015-05-14 LAB — PREPARE RBC (CROSSMATCH)

## 2015-05-14 LAB — APTT: APTT: 32 s (ref 24–36)

## 2015-05-14 LAB — PROTIME-INR
INR: 1.38
Prothrombin Time: 17.2 seconds — ABNORMAL HIGH (ref 11.4–15.0)

## 2015-05-14 LAB — LIPASE, BLOOD: Lipase: 21 U/L (ref 11–51)

## 2015-05-14 MED ORDER — METHYLPREDNISOLONE SODIUM SUCC 125 MG IJ SOLR
125.0000 mg | Freq: Once | INTRAMUSCULAR | Status: AC
  Administered 2015-05-14: 125 mg via INTRAVENOUS
  Filled 2015-05-14: qty 2

## 2015-05-14 MED ORDER — SODIUM CHLORIDE 0.9 % IJ SOLN
3.0000 mL | Freq: Two times a day (BID) | INTRAMUSCULAR | Status: DC
  Administered 2015-05-14 – 2015-05-17 (×5): 3 mL via INTRAVENOUS

## 2015-05-14 MED ORDER — FUROSEMIDE 40 MG PO TABS
40.0000 mg | ORAL_TABLET | Freq: Two times a day (BID) | ORAL | Status: DC
  Administered 2015-05-15 – 2015-05-18 (×7): 40 mg via ORAL
  Filled 2015-05-14 (×7): qty 1

## 2015-05-14 MED ORDER — METFORMIN HCL 500 MG PO TABS
250.0000 mg | ORAL_TABLET | Freq: Every day | ORAL | Status: DC
Start: 2015-05-15 — End: 2015-05-18
  Administered 2015-05-15 – 2015-05-18 (×4): 250 mg via ORAL
  Filled 2015-05-14 (×4): qty 1

## 2015-05-14 MED ORDER — DONEPEZIL HCL 5 MG PO TABS
10.0000 mg | ORAL_TABLET | Freq: Every day | ORAL | Status: DC
Start: 2015-05-14 — End: 2015-05-18
  Administered 2015-05-14 – 2015-05-17 (×4): 10 mg via ORAL
  Filled 2015-05-14 (×4): qty 2

## 2015-05-14 MED ORDER — GABAPENTIN 300 MG PO CAPS
400.0000 mg | ORAL_CAPSULE | Freq: Two times a day (BID) | ORAL | Status: DC
  Administered 2015-05-14 – 2015-05-18 (×8): 400 mg via ORAL
  Filled 2015-05-14 (×10): qty 1

## 2015-05-14 MED ORDER — MOMETASONE FURO-FORMOTEROL FUM 100-5 MCG/ACT IN AERO
2.0000 | INHALATION_SPRAY | Freq: Two times a day (BID) | RESPIRATORY_TRACT | Status: DC
  Administered 2015-05-14 – 2015-05-18 (×8): 2 via RESPIRATORY_TRACT
  Filled 2015-05-14 (×2): qty 8.8

## 2015-05-14 MED ORDER — TRAMADOL HCL 50 MG PO TABS: 50.0000 mg | ORAL_TABLET | Freq: Four times a day (QID) | ORAL | Status: DC | PRN

## 2015-05-14 MED ORDER — ALBUTEROL SULFATE (2.5 MG/3ML) 0.083% IN NEBU
2.5000 mg | INHALATION_SOLUTION | RESPIRATORY_TRACT | Status: DC | PRN
  Administered 2015-05-16: 04:00:00 2.5 mg via RESPIRATORY_TRACT
  Filled 2015-05-14: qty 3

## 2015-05-14 MED ORDER — LORAZEPAM 1 MG PO TABS
1.0000 mg | ORAL_TABLET | Freq: Once | ORAL | Status: AC
  Administered 2015-05-14: 1 mg via ORAL
  Filled 2015-05-14: qty 1

## 2015-05-14 MED ORDER — AMIODARONE HCL 200 MG PO TABS
200.0000 mg | ORAL_TABLET | Freq: Every day | ORAL | Status: DC
  Administered 2015-05-15 – 2015-05-18 (×4): 200 mg via ORAL
  Filled 2015-05-14 (×4): qty 1

## 2015-05-14 MED ORDER — LORAZEPAM 0.5 MG PO TABS
0.5000 mg | ORAL_TABLET | Freq: Every day | ORAL | Status: DC
  Administered 2015-05-14 – 2015-05-17 (×4): 0.5 mg via ORAL
  Filled 2015-05-14 (×4): qty 1

## 2015-05-14 MED ORDER — METOPROLOL TARTRATE 25 MG PO TABS
25.0000 mg | ORAL_TABLET | Freq: Two times a day (BID) | ORAL | Status: DC
  Administered 2015-05-14 – 2015-05-18 (×7): 25 mg via ORAL
  Filled 2015-05-14 (×8): qty 1

## 2015-05-14 MED ORDER — DIVALPROEX SODIUM 125 MG PO CPSP: 250.0000 mg | ORAL_CAPSULE | Freq: Three times a day (TID) | ORAL | Status: DC

## 2015-05-14 MED ORDER — SODIUM CHLORIDE 0.9 % IV BOLUS (SEPSIS)
1000.0000 mL | Freq: Once | INTRAVENOUS | Status: AC
  Administered 2015-05-14: 1000 mL via INTRAVENOUS

## 2015-05-14 MED ORDER — DIVALPROEX SODIUM 125 MG PO CSDR
250.0000 mg | DELAYED_RELEASE_CAPSULE | Freq: Three times a day (TID) | ORAL | Status: DC
  Administered 2015-05-14 – 2015-05-18 (×11): 250 mg via ORAL
  Filled 2015-05-14 (×11): qty 2

## 2015-05-14 MED ORDER — POTASSIUM CHLORIDE CRYS ER 20 MEQ PO TBCR
20.0000 meq | EXTENDED_RELEASE_TABLET | Freq: Two times a day (BID) | ORAL | Status: DC
  Administered 2015-05-14 – 2015-05-18 (×8): 20 meq via ORAL
  Filled 2015-05-14 (×8): qty 1

## 2015-05-14 MED ORDER — FAMOTIDINE 20 MG PO TABS
20.0000 mg | ORAL_TABLET | Freq: Two times a day (BID) | ORAL | Status: DC
  Administered 2015-05-14 – 2015-05-18 (×8): 20 mg via ORAL
  Filled 2015-05-14 (×8): qty 1

## 2015-05-14 MED ORDER — DIPHENHYDRAMINE HCL 50 MG/ML IJ SOLN: 12.5000 mg | Freq: Once | INTRAMUSCULAR | Status: DC

## 2015-05-14 MED ORDER — DIPHENHYDRAMINE HCL 50 MG/ML IJ SOLN
25.0000 mg | Freq: Once | INTRAMUSCULAR | Status: AC
  Administered 2015-05-14: 25 mg via INTRAVENOUS
  Filled 2015-05-14: qty 1

## 2015-05-14 MED ORDER — PANTOPRAZOLE SODIUM 40 MG IV SOLR
40.0000 mg | Freq: Two times a day (BID) | INTRAVENOUS | Status: DC
  Administered 2015-05-14 – 2015-05-16 (×5): 40 mg via INTRAVENOUS
  Filled 2015-05-14 (×5): qty 40

## 2015-05-14 MED ORDER — TIOTROPIUM BROMIDE MONOHYDRATE 18 MCG IN CAPS
18.0000 ug | ORAL_CAPSULE | Freq: Every day | RESPIRATORY_TRACT | Status: DC
  Administered 2015-05-15 – 2015-05-18 (×4): 18 ug via RESPIRATORY_TRACT
  Filled 2015-05-14 (×2): qty 5

## 2015-05-14 MED ORDER — ENALAPRIL MALEATE 5 MG PO TABS
5.0000 mg | ORAL_TABLET | Freq: Two times a day (BID) | ORAL | Status: DC
  Administered 2015-05-14 – 2015-05-15 (×2): 5 mg via ORAL
  Administered 2015-05-15: 2.5 mg via ORAL
  Administered 2015-05-16 – 2015-05-18 (×4): 5 mg via ORAL
  Filled 2015-05-14 (×10): qty 1

## 2015-05-14 MED ORDER — LEVOTHYROXINE SODIUM 75 MCG PO TABS
150.0000 ug | ORAL_TABLET | Freq: Every day | ORAL | Status: DC
  Administered 2015-05-15 – 2015-05-18 (×4): 150 ug via ORAL
  Filled 2015-05-14 (×4): qty 2

## 2015-05-14 MED ORDER — MELATONIN 3 MG PO TABS: 3.0000 mg | ORAL_TABLET | Freq: Every day | ORAL | Status: DC

## 2015-05-14 MED ORDER — FERROUS SULFATE 325 (65 FE) MG PO TABS
325.0000 mg | ORAL_TABLET | Freq: Every day | ORAL | Status: DC
  Administered 2015-05-15 – 2015-05-18 (×4): 325 mg via ORAL
  Filled 2015-05-14 (×4): qty 1

## 2015-05-14 MED ORDER — SODIUM CHLORIDE 0.9 % IV SOLN: 10.0000 mL/h | Freq: Once | INTRAVENOUS | Status: DC

## 2015-05-14 MED ORDER — VENLAFAXINE HCL ER 37.5 MG PO CP24
37.5000 mg | ORAL_CAPSULE | Freq: Every day | ORAL | Status: DC
  Administered 2015-05-15 – 2015-05-18 (×4): 37.5 mg via ORAL
  Filled 2015-05-14 (×4): qty 1

## 2015-05-14 NOTE — Assessment & Plan Note (Signed)
Patient's hgb came back in the office today at 5.2, Very weak and was hypotensive initially. EMS called and patient to go to the ER for evaluation.

## 2015-05-14 NOTE — Progress Notes (Signed)
BP 99/59 mmHg  Pulse 73  Temp(Src) 98.6 F (37 C)  SpO2 92%   Subjective:    Patient ID: Theresa Vasquez, female    DOB: [birth year], 81 y.o.   MRN: [id number]  HPI: Theresa Vasquez is a 81 y.o. female  Chief Complaint  Patient presents with  . Anemia  . Fatigue    pt states she feels tired all the time   Care giver notes that she has been feeling weak and tired. Son is concerned about her being anemic. Tried to see her hematologist today, but couldn't get in.   Relevant past medical, surgical, family and social history reviewed and updated as indicated. Interim medical history since our last visit reviewed. Allergies and medications reviewed and updated.  Review of Systems  Constitutional: Positive for fatigue. Negative for fever, chills, diaphoresis, activity change, appetite change and unexpected weight change.  Respiratory: Negative.   Cardiovascular: Negative.   Gastrointestinal: Positive for nausea, vomiting, diarrhea, blood in stool and anal bleeding. Negative for abdominal pain, constipation, abdominal distention and rectal pain.  Skin: Positive for pallor. Negative for color change, rash and wound.  Psychiatric/Behavioral: Negative.     Per HPI unless specifically indicated above     Objective:    BP 99/59 mmHg  Pulse 73  Temp(Src) 98.6 F (37 C)  SpO2 92%  Wt Readings from Last 3 Encounters:  05/14/15 195 lb 4.8 oz (88.587 kg)  10/01/14 189 lb 2.5 oz (85.801 kg)    Physical Exam  Constitutional: She is oriented to person, place, and time. She appears well-developed and well-nourished. She appears distressed.  HENT:  Head: Normocephalic and atraumatic.  Right Ear: Hearing normal.  Left Ear: Hearing normal.  Nose: Nose normal.  Eyes: Conjunctivae and lids are normal. Right eye exhibits no discharge. Left eye exhibits no discharge. No scleral icterus.  Pulmonary/Chest: No respiratory distress.  Musculoskeletal: Normal range of motion.  Neurological:  She is alert and oriented to person, place, and time.  Skin: Skin is warm, dry and intact. No rash noted. No erythema. There is pallor.  Psychiatric: She has a normal mood and affect. Her speech is normal and behavior is normal. Judgment and thought content normal. Cognition and memory are normal.  Nursing note and vitals reviewed.   Results for orders placed or performed in visit on 05/01/15  TSH  Result Value Ref Range   TSH 2.780 0.450 - 4.500 uIU/mL      Assessment & Plan:   Problem List Items Addressed This Visit      Cardiovascular and Mediastinum   PVD (peripheral vascular disease) ([place])   Type 2 DM with CKD stage 2 and hypertension (HCC)   Mitral regurgitation   LBBB (left bundle branch block)     Respiratory   COPD, severe (HCC)   Hypoxia     Digestive   GI bleed   Diverticulosis   Barrett's esophagus   Ischemic colitis ([employer])     Endocrine   Hypothyroidism     Nervous and Auditory   Vascular dementia   Diabetic polyneuropathy ([place])     Musculoskeletal and Integument   Osteoporosis   Nondisplaced fracture of fourth metatarsal bone of right foot     Genitourinary   Benign hypertensive renal disease     Hematopoietic and Hemostatic   Thrombocytosis (HCC)     Other   Iron deficiency anemia - Primary   Relevant Orders   CBC With Differential/Platelet   Comprehensive metabolic panel  Bayer DCA Hb A1c Waived   UA/M w/rflx Culture, Routine   Microalbumin, Urine Waived   History of pneumonia   Anxiety disorder   Edema   Abnormality of gait   Drug addiction in remission (HCC)   Oxygen dependent   Carotid bruit   History of peptic ulcer disease    Other Visit Diagnoses    Diarrhea, unspecified type        Relevant Orders    CBC With Differential/Platelet    Comprehensive metabolic panel    Bayer DCA Hb A1c Waived    UA/M w/rflx Culture, Routine    Microalbumin, Urine Waived    Weakness        Relevant Orders    CBC With Differential/Platelet     Comprehensive metabolic panel    Bayer DCA Hb A1c Waived    UA/M w/rflx Culture, Routine    Microalbumin, Urine Waived        Follow up plan: No Follow-up on file.

## 2015-05-14 NOTE — ED Notes (Signed)
Patient transported to X-ray via stretcher 

## 2015-05-14 NOTE — ED Notes (Addendum)
Pt tearful, anxious. Pt repeating that she doesn't know what is going on with her medical care. Son states that pt has dementia and that she sundowns around dinner time. MD made aware of pt's condition.

## 2015-05-14 NOTE — ED Notes (Signed)
Floor has stated that pt can not be sent up before 7:15.

## 2015-05-14 NOTE — ED Notes (Signed)
Pt to doctors office today for weakness x 1-2 weeks. hgb 5.2 at office. Per ems report - pt with history of anemia, son saw blood in stool last week. Pt poor historian.

## 2015-05-14 NOTE — Progress Notes (Signed)

## 2015-05-14 NOTE — ED Notes (Signed)
RBC Unit started at 18:13

## 2015-05-14 NOTE — ED Notes (Signed)
Dr. Tressia Miners called and made aware of temperature. MD stated that as long as temperature is not above 100.5 then blood can be administered.

## 2015-05-14 NOTE — ED Notes (Signed)
Pt appears less anxious and is no longer tearful. Pt is no longer shouting about her medical care.

## 2015-05-14 NOTE — ED Provider Notes (Signed)
[facility] Emergency Department Provider Note  ____________________________________________  Time seen: 3:10 PM  I have reviewed the triage vital signs and the nursing notes.   HISTORY  Chief Complaint Weakness    HPI Theresa Vasquez is a 81 y.o. female sent to the ED from her primary care office for a hemoglobin of 5.2. The patient has had generalized weakness for one to 2 weeks. Patient's visiting caregiver also noticed blood in her stool yesterday. The patient also had an episode of vomiting yesterday but did not have any blood in it. Patient denies chest pain shortness of breath dizziness lightheadedness fever or chills nausea at present for abdominal pain or back pain.     Past Medical History  Diagnosis Date  . Anemia   . Diabetes mellitus without complication ([place])   . Depression   . Hypertension   . Colitis   . Diverticulitis   . Hypothyroidism   . Hyperlipidemia   . PVD (peripheral vascular disease) ([place]) 05/14/2015     Patient Active Problem List   Diagnosis Date Noted  . PVD (peripheral vascular disease) ([place]) 05/14/2015  . Type 2 DM with CKD stage 2 and hypertension ([place]) 05/14/2015  . COPD, severe ([place]) 05/14/2015  . History of pneumonia 05/14/2015  . Hypoxia 05/14/2015  . GI bleed 05/14/2015  . Benign hypertensive renal disease 05/14/2015  . Vascular dementia 05/14/2015  . Anxiety disorder 05/14/2015  . Edema 05/14/2015  . Diabetic polyneuropathy ([place]) 05/14/2015  . Diverticulosis 05/14/2015  . Abnormality of gait 05/14/2015  . Osteoporosis 05/14/2015  . Thrombocytosis ([place]) 05/14/2015  . Hypothyroidism 05/14/2015  . Barrett's esophagus 05/14/2015  . Drug addiction in remission ([place]) 05/14/2015  . Mitral regurgitation 05/14/2015  . LBBB (left bundle branch block) 05/14/2015  . Oxygen dependent 05/14/2015  . Carotid bruit 05/14/2015  . Iron deficiency anemia 11/24/2014     Past Surgical History  Procedure  Laterality Date  . Abdominal hysterectomy      partial     Current Outpatient Rx  Name  Route  Sig  Dispense  Refill  . albuterol (PROVENTIL) (2.5 MG/3ML) 0.083% nebulizer solution   Nebulization   Take 2.5 mg by nebulization every 4 (four) hours as needed for wheezing or shortness of breath.         Marland Kitchen amiodarone (PACERONE) 200 MG tablet   Oral   Take 200 mg by mouth daily. Take in the morning         . apixaban (ELIQUIS) 5 MG TABS tablet   Oral   Take 5 mg by mouth 2 (two) times daily.         . divalproex (DEPAKOTE SPRINKLE) 125 MG capsule   Oral   Take 125 mg by mouth 3 (three) times daily. Take 2 capsules 3 times a day         . donepezil (ARICEPT) 10 MG tablet   Oral   Take 10 mg by mouth at bedtime.         . donepezil (ARICEPT) 10 MG tablet   Oral   Take 1 tablet (10 mg total) by mouth at bedtime.   30 tablet   3   . enalapril (VASOTEC) 2.5 MG tablet   Oral   Take 2.5 mg by mouth 2 (two) times daily. Take 2 tabs 2 times a day.         . famotidine (PEPCID) 20 MG tablet   Oral   Take 20 mg by mouth 2 (  two) times daily.         . ferrous sulfate 325 (65 FE) MG tablet   Oral   Take 325 mg by mouth daily with breakfast.         . Fluticasone-Salmeterol (ADVAIR) 250-50 MCG/DOSE AEPB   Inhalation   Inhale 1 puff into the lungs 2 (two) times daily.         . Fluticasone-Salmeterol (ADVAIR) 250-50 MCG/DOSE AEPB   Inhalation   Inhale 1 puff into the lungs 2 (two) times daily. 1 puff 2 times a day   60 each   12   . furosemide (LASIX) 40 MG tablet   Oral   Take 40 mg by mouth 2 (two) times daily.         Marland Kitchen gabapentin (NEURONTIN) 400 MG capsule   Oral   Take 1 capsule (400 mg total) by mouth 2 (two) times daily.   60 capsule   3   . levothyroxine (SYNTHROID, LEVOTHROID) 150 MCG tablet   Oral   Take 1 tablet (150 mcg total) by mouth daily before breakfast.   90 tablet   3   . LORazepam (ATIVAN) 0.5 MG tablet   Oral   Take 1  tablet (0.5 mg total) by mouth daily.   30 tablet   2   . Melatonin 3 MG TABS   Oral   Take 1 tablet by mouth at bedtime.         . metFORMIN (GLUCOPHAGE) 500 MG tablet   Oral   Take 0.5 tablets (250 mg total) by mouth daily with breakfast.   45 tablet   1   . metoprolol (LOPRESSOR) 50 MG tablet   Oral   Take 50 mg by mouth 2 (two) times daily. Take 1/2 tablet orally 2 times a day         . pantoprazole (PROTONIX) 40 MG tablet   Oral   Take 1 tablet (40 mg total) by mouth 2 (two) times daily.   60 tablet   3   . Potassium Chloride ER 20 MEQ TBCR   Oral   Take 1 tablet by mouth 2 (two) times daily.         Marland Kitchen tiotropium (SPIRIVA) 18 MCG inhalation capsule   Inhalation   Place 18 mcg into inhaler and inhale daily.         . traMADol (ULTRAM) 50 MG tablet   Oral   Take 50 mg by mouth every 6 (six) hours as needed.         . venlafaxine XR (EFFEXOR XR) 37.5 MG 24 hr capsule   Oral   Take 1 capsule (37.5 mg total) by mouth daily with breakfast.   30 capsule   6      Allergies Review of patient's allergies indicates no known allergies.   No family history on file.  Social History Social History  Substance Use Topics  . Smoking status: Former Research scientist (life sciences)  . Smokeless tobacco: Never Used  . Alcohol Use: No    Review of Systems  Constitutional:   No fever or chills. No weight changes. Generalized weakness Eyes:   No blurry vision or double vision.  ENT:   No sore throat. Cardiovascular:   No chest pain. Respiratory:   No dyspnea or cough. Gastrointestinal:   Negative for abdominal pain, vomiting and diarrhea.  No BRBPR or melena. Genitourinary:   Negative for dysuria, urinary retention, bloody urine, or difficulty urinating. Musculoskeletal:   Negative for back  pain. No joint swelling or pain. Skin:   Negative for rash. Neurological:   Negative for headaches, focal weakness or numbness. Psychiatric:  No anxiety or depression.   Endocrine:  No  hot/cold intolerance, changes in energy, or sleep difficulty.  10-point ROS otherwise negative.  ____________________________________________   PHYSICAL EXAM:  VITAL SIGNS: ED Triage Vitals  Enc Vitals Group     BP 05/14/15 1509 106/52 mmHg     Pulse Rate 05/14/15 1509 75     Resp --      Temp 05/14/15 1509 99.1 F (37.3 C)     Temp Source 05/14/15 1509 Oral     SpO2 05/14/15 1509 85 %     Weight --      Height --      Head Cir --      Peak Flow --      Pain Score --      Pain Loc --      Pain Edu? --      Excl. in [place]? --      Constitutional:   Alert and oriented to person and place. Well appearing and in no distress. Eyes:   No scleral icterus. Positive conjunctival pallor. PERRL. EOMI ENT   Head:   Normocephalic and atraumatic.   Nose:   No congestion/rhinnorhea. No septal hematoma   Mouth/Throat:   MMM, no pharyngeal erythema. No peritonsillar mass. No uvula shift.   Neck:   No stridor. No SubQ emphysema. No meningismus. Hematological/Lymphatic/Immunilogical:   No cervical lymphadenopathy. Cardiovascular:   RRR. Normal and symmetric distal pulses are present in all extremities. No murmurs, rubs, or gallops. Respiratory:   Normal respiratory effort without tachypnea nor retractions. Breath sounds are clear and equal bilaterally. No wheezes/rales/rhonchi. Gastrointestinal:   Soft and nontender. No distention. There is no CVA tenderness.  No rebound, rigidity, or guarding. Rectal exam reveals melanotic  stool that is strongly Hemoccult positive Genitourinary:   deferred Musculoskeletal:   Nontender with normal range of motion in all extremities. No joint effusions.  No lower extremity tenderness.  No edema. Neurologic:   Normal speech and language.  CN 2-10 normal. Motor grossly intact. No pronator drift.  Normal gait. No gross focal neurologic deficits are appreciated.  Skin:    Skin is warm, dry and intact. No rash noted.  No petechiae, purpura, or  bullae. Psychiatric:   Mood and affect are normal. Speech and behavior are normal. Patient exhibits appropriate insight and judgment.  ____________________________________________    LABS (pertinent positives/negatives) (all labs ordered are listed, but only abnormal results are displayed) Labs Reviewed  CBC WITH DIFFERENTIAL/PLATELET - Abnormal; Notable for the following:    RBC 1.80 (*)    Hemoglobin 5.0 (*)    HCT 16.5 (*)    MCHC 30.1 (*)    RDW 15.4 (*)    Monocytes Absolute 1.1 (*)    All other components within normal limits  COMPREHENSIVE METABOLIC PANEL  LIPASE, BLOOD  PROTIME-INR  APTT  URINALYSIS COMPLETEWITH MICROSCOPIC ([facility] ONLY)  TYPE AND SCREEN  PREPARE RBC (CROSSMATCH)  ABO/RH   ____________________________________________   EKG  Interpreted by me Sinus rhythm rate of 68, left axis, normal intervals. Left bundle-branch block. No acute ischemic changes.  ____________________________________________    RADIOLOGY    ____________________________________________   PROCEDURES CRITICAL CARE Performed by: Carrie Mew   Total critical care time: 35 minutes  Critical care time was exclusive of separately billable procedures and treating other patients.  Critical care was  necessary to treat or prevent imminent or life-threatening deterioration.  Critical care was time spent personally by me on the following activities: development of treatment plan with patient and/or surrogate as well as nursing, discussions with consultants, evaluation of patient's response to treatment, examination of patient, obtaining history from patient or surrogate, ordering and performing treatments and interventions, ordering and review of laboratory studies, ordering and review of radiographic studies, pulse oximetry and re-evaluation of patient's condition.   ____________________________________________   INITIAL IMPRESSION / ASSESSMENT AND PLAN / ED  COURSE  Pertinent labs & imaging results that were available during my care of the patient were reviewed by me and considered in my medical decision making (see chart for details).  Patient presents with GI bleed and a hemoglobin of 5. She does not have any focal symptoms other than generalized weakness, and her blood pressure and other vital signs are unremarkable at present time. She did have a documented episode of hypotension with a blood pressure of 80/45 at 1:50 PM on initial arrival, but since then blood pressures have been totally adequate. Her son reports multiple adverse reactions to blood transfusions in the past due to fluid overload or respiratory distress requiring intubation and intensive care, so we'll be cautious with fluids. We'll pretreat the patient with steroids and Benadryl prior to blood transfusion which she clearly needs with a hemoglobin of 5 and ongoing GI bleeding in the setting of Eliquis use. We'll plan to hospitalize the patient for further management.     ____________________________________________   FINAL CLINICAL IMPRESSION(S) / ED DIAGNOSES  Final diagnoses:  Severe anemia  Acute GI bleeding      Carrie Mew, MD 05/14/15 [phone]

## 2015-05-14 NOTE — Plan of Care (Signed)
Problem: Discharge Progression Outcomes Goal: Barriers To Progression Addressed/Resolved Outcome: Progressing Individualization: 1. Lives at home with private caregiver/ son is very supportive and reports he is HPOA. 2. Son reports pt pulls things off, doesn't like diapers, will have alternating moods with ativan working well, gets more confused at times than others. 3. Son reports intermittent history of chronic blood loss with chronic anemia and is followed by [facility]; reports blood was reported in pt stools yesterday. 4. HIGH risk for fall;room near nurses station, protective footwear, bed alarm, educate pt/family "call,don't fall", secure lines as much as possible, keep personal items in hand reach, hourly rounds to offer toileting, encourage family to stay with pt when able. 5. PMH:  DM, HTN,dementia, at fib, depression, chronic anemia controlled by home meds and diet.

## 2015-05-14 NOTE — H&P (Signed)
[facility] [place] NAME: Theresa Vasquez    MR#:  [id number]  DATE OF BIRTH:  [birth year]  DATE OF ADMISSION:  05/14/2015  PRIMARY CARE PHYSICIAN: Park Liter, DO , [facility]  REQUESTING/REFERRING PHYSICIAN: Dr. Carrie Mew  CHIEF COMPLAINT:   Chief Complaint  Patient presents with  . Weakness    HISTORY OF PRESENT ILLNESS:  Theresa Vasquez  is a 81 y.o. female with a known history of diabetes, hypertension, congestive heart failure, dementia and paroxysmal atrial fibrillation status post cardioversion on eliquis comes to the hospital from PCP office secondary to low hemoglobin. Due to dementia, patient is unable to provide any history. Also she was having one of her anxiety episodes where she gets emotional and cries a lot. Her son is at bedside and provides most of the history. Patient stays at home and has caregiver all the time with her. Patient has been complaining of weakness for the last couple of days. She has been having some loose stools, not sure if she was passing any blood or not as she usually flashes before the caregiver came to see. But last night the caregiver told the son that patient did have some blood in the stool. Patient has known history of anemia, has required transfusions in the past. She is also following at the cancer center for IV iron infusions. Also of note, she has had transfusion associated reaction with pulmonary edema and ended up in ICU 3 out of 4 times in the past if blood is transfused at a faster rate. No noted fevers or chills, no pedal edema, no worsening dyspnea noted. No other source of bleeding identified. Patient is on eliquis for her atrial fibrillation.  PAST MEDICAL HISTORY:   Past Medical History  Diagnosis Date  . Anemia   . Diabetes mellitus without complication ([place])   . Depression   . Hypertension   . Colitis   . Diverticulitis   . Hypothyroidism   .  Hyperlipidemia   . PVD (peripheral vascular disease) ([place]) 05/14/2015  . CHF (congestive heart failure) ([place])   . Dementia   . Paroxysmal atrial fibrillation (HCC)     s/p cardioversion  . Iron deficiency anemia     PAST SURGICAL HISTORY:   Past Surgical History  Procedure Laterality Date  . Abdominal hysterectomy      partial    SOCIAL HISTORY:   Social History  Substance Use Topics  . Smoking status: Former Research scientist (life sciences)  . Smokeless tobacco: Never Used  . Alcohol Use: No    FAMILY HISTORY:   Family History  Problem Relation Age of Onset  . Diabetes Mother   . Diabetes Father     DRUG ALLERGIES:  No Known Allergies  REVIEW OF SYSTEMS:   Review of Systems  Constitutional: Positive for malaise/fatigue. Negative for fever, chills and weight loss.  HENT: Positive for hearing loss. Negative for ear discharge, ear pain, nosebleeds and tinnitus.   Eyes: Negative for blurred vision, double vision and photophobia.  Respiratory: Negative for cough, hemoptysis, shortness of breath and wheezing.   Cardiovascular: Negative for chest pain, palpitations, orthopnea and leg swelling.  Gastrointestinal: Positive for heartburn and blood in stool. Negative for nausea, vomiting, abdominal pain, diarrhea, constipation and melena.  Genitourinary: Negative for dysuria, urgency, frequency and hematuria.       Incontinent  Musculoskeletal: Negative for myalgias, back pain and neck pain.  Skin: Negative for rash.  Neurological: Positive for  weakness. Negative for dizziness, tingling, tremors, sensory change, speech change, focal weakness and headaches.  Endo/Heme/Allergies: Does not bruise/bleed easily.  Psychiatric/Behavioral: Negative for depression. The patient is nervous/anxious.     MEDICATIONS AT HOME:   Prior to Admission medications   Medication Sig Start Date End Date Taking? Authorizing Provider  albuterol (PROVENTIL) (2.5 MG/3ML) 0.083% nebulizer solution Take 2.5 mg by  nebulization every 4 (four) hours as needed for wheezing or shortness of breath.   Yes Historical Provider, MD  amiodarone (PACERONE) 200 MG tablet Take 200 mg by mouth daily.    Yes Historical Provider, MD  apixaban (ELIQUIS) 5 MG TABS tablet Take 5 mg by mouth 2 (two) times daily.   Yes Historical Provider, MD  divalproex (DEPAKOTE SPRINKLE) 125 MG capsule Take 250 mg by mouth 3 (three) times daily.    Yes Historical Provider, MD  donepezil (ARICEPT) 10 MG tablet Take 1 tablet (10 mg total) by mouth at bedtime. 01/17/15  Yes Megan P Johnson, DO  enalapril (VASOTEC) 2.5 MG tablet Take 5 mg by mouth 2 (two) times daily.    Yes Historical Provider, MD  famotidine (PEPCID) 20 MG tablet Take 20 mg by mouth 2 (two) times daily.   Yes Historical Provider, MD  ferrous sulfate 325 (65 FE) MG tablet Take 325 mg by mouth daily.    Yes Historical Provider, MD  Fluticasone-Salmeterol (ADVAIR) 250-50 MCG/DOSE AEPB Inhale 1 puff into the lungs 2 (two) times daily.   Yes Historical Provider, MD  furosemide (LASIX) 40 MG tablet Take 40 mg by mouth 2 (two) times daily.   Yes Historical Provider, MD  gabapentin (NEURONTIN) 400 MG capsule Take 1 capsule (400 mg total) by mouth 2 (two) times daily. 01/17/15  Yes Megan P Johnson, DO  levothyroxine (SYNTHROID, LEVOTHROID) 150 MCG tablet Take 1 tablet (150 mcg total) by mouth daily before breakfast. 05/02/15  Yes Megan P Johnson, DO  LORazepam (ATIVAN) 0.5 MG tablet Take 1 tablet (0.5 mg total) by mouth daily. Patient taking differently: Take 0.5 mg by mouth daily as needed for anxiety.  02/05/15  Yes Megan P Johnson, DO  Melatonin 3 MG TABS Take 3 mg by mouth at bedtime.    Yes Historical Provider, MD  metFORMIN (GLUCOPHAGE) 500 MG tablet Take 0.5 tablets (250 mg total) by mouth daily with breakfast. 12/20/14  Yes Megan P Johnson, DO  metoprolol (LOPRESSOR) 50 MG tablet Take 25 mg by mouth 2 (two) times daily.    Yes Historical Provider, MD  pantoprazole (PROTONIX) 40 MG  tablet Take 1 tablet (40 mg total) by mouth 2 (two) times daily. 01/07/15  Yes Megan P Johnson, DO  potassium chloride SA (K-DUR,KLOR-CON) 20 MEQ tablet Take 20 mEq by mouth 2 (two) times daily.   Yes Historical Provider, MD  tiotropium (SPIRIVA) 18 MCG inhalation capsule Place 18 mcg into inhaler and inhale daily.   Yes Historical Provider, MD  traMADol (ULTRAM) 50 MG tablet Take 50 mg by mouth every 6 (six) hours as needed for moderate pain.    Yes Historical Provider, MD  venlafaxine XR (EFFEXOR XR) 37.5 MG 24 hr capsule Take 1 capsule (37.5 mg total) by mouth daily with breakfast. 12/21/14  Yes Megan P Johnson, DO      VITAL SIGNS:  Blood pressure 145/46, pulse 75, temperature 99.1 F (37.3 C), temperature source Oral, resp. rate 21, SpO2 100 %.  PHYSICAL EXAMINATION:   Physical Exam  GENERAL:  81 y.o.-year-old elderly patient lying in the bed  with no acute distress.  EYES: Pupils equal, round, reactive to light and accommodation. No scleral icterus. Extraocular muscles intact. Wearing glasses Pallor conjunctiva HEENT: Head atraumatic, normocephalic. Oropharynx and nasopharynx clear.  NECK:  Supple, no jugular venous distention. No thyroid enlargement, no tenderness.  LUNGS: Normal breath sounds bilaterally, no wheezing, rales,rhonchi or crepitation. No use of accessory muscles of respiration. Decreased bibasilar breath sounds CARDIOVASCULAR: S1, S2 normal. No  rubs, or gallops. 3/6 systolic murmur present ABDOMEN: Soft, nontender, nondistended. Bowel sounds present. No organomegaly or mass.  EXTREMITIES: No pedal edema, cyanosis, or clubbing.  NEUROLOGIC: Cranial nerves II through XII are intact. Muscle strength 5/5 in all extremities. Sensation intact. Gait not checked.  PSYCHIATRIC: The patient is alert and oriented x 1-2.  SKIN: No obvious rash, lesion, or ulcer.   LABORATORY PANEL:   CBC  Recent Labs Lab 05/14/15 1526  WBC 7.2  HGB 5.0*  HCT 16.5*  PLT 400    ------------------------------------------------------------------------------------------------------------------  Chemistries  No results for input(s): NA, K, CL, CO2, GLUCOSE, BUN, CREATININE, CALCIUM, MG, AST, ALT, ALKPHOS, BILITOT in the last 168 hours.  Invalid input(s): GFRCGP ------------------------------------------------------------------------------------------------------------------  Cardiac Enzymes No results for input(s): TROPONINI in the last 168 hours. ------------------------------------------------------------------------------------------------------------------  RADIOLOGY:  No results found.  EKG:   Orders placed or performed during the hospital encounter of 05/14/15  . ED EKG  . ED EKG  . EKG 12-Lead  . EKG 12-Lead    IMPRESSION AND PLAN:   Whittley Carandang  is a 81 y.o. female with a known history of diabetes, hypertension, congestive heart failure, dementia and paroxysmal atrial fibrillation status post cardioversion on eliquis comes to the hospital from PCP office secondary to low hemoglobin.  #1 Anemia- acute on chronic anemia - likely GI source from being on eliquis - hold Eliquis, 2units Tx - very slowly as had pulmonary edema in the past - monitor,  - IV Protonix, liquid diet - GI consult, likely no procedures as has been on eliquis -Hemoglobin check every 8 hours. No active bleeding at this time.  #2 congestive heart failure-unknown ejection fraction. -Follow up echocardiogram. Not in any acute distress or no volume overload. -Chest x-ray ordered. -Monitor after transfusion. No IV fluids. Continue her Lasix  #3 dementia and depression-seems to be at baseline. -Typical history of sundowning. Also gets emotional and cries a lot when she is upset. -Continue Effexor, Ativan, Aricept -Also on Depakote for mood stability  #4 atrial fibrillation-paroxysmal, status post cardioversion. Currently in sinus rhythm. -Continue amiodarone and also to  prolonged -Hold eliquis at this time.  #5 DVT prophylaxis-Ted's and SCDs   All the records are reviewed and case discussed with ED provider. Management plans discussed with the patient, family and they are in agreement.  CODE STATUS: Full Code- discussed with son at bedside  TOTAL TIME TAKING CARE OF THIS PATIENT: 50 minutes.    Gladstone Lighter M.D on 05/14/2015 at 5:28 PM  Between 7am to 6pm - Pager - [phone]  After 6pm go to [email or web address] - password [email or web address] [facility]  Office  [phone]  CC: Primary care physician; Park Liter, DO

## 2015-05-15 ENCOUNTER — Telehealth: Payer: Self-pay | Admitting: Family Medicine

## 2015-05-15 ENCOUNTER — Inpatient Hospital Stay (HOSPITAL_COMMUNITY)
Admit: 2015-05-15 | Discharge: 2015-05-15 | Disposition: A | Discharge status: Home / Self Care | Payer: Medicare Other | Attending: Internal Medicine | Admitting: Internal Medicine

## 2015-05-15 DIAGNOSIS — I350 Nonrheumatic aortic (valve) stenosis: Secondary | ICD-10-CM

## 2015-05-15 DIAGNOSIS — K922 Gastrointestinal hemorrhage, unspecified: Secondary | ICD-10-CM

## 2015-05-15 DIAGNOSIS — D649 Anemia, unspecified: Secondary | ICD-10-CM

## 2015-05-15 LAB — BASIC METABOLIC PANEL
ANION GAP: 6 (ref 5–15)
BUN: 18 mg/dL (ref 6–20)
CHLORIDE: 106 mmol/L (ref 101–111)
CO2: 28 mmol/L (ref 22–32)
CREATININE: 0.76 mg/dL (ref 0.44–1.00)
Calcium: 7.7 mg/dL — ABNORMAL LOW (ref 8.9–10.3)
GFR calc non Af Amer: >60 mL/min (ref >60)
Glucose, Bld: 205 mg/dL — ABNORMAL HIGH (ref 65–99)
POTASSIUM: 4.2 mmol/L (ref 3.5–5.1)
SODIUM: 140 mmol/L (ref 135–145)

## 2015-05-15 LAB — CBC
HEMATOCRIT: 20.4 % — AB (ref 35.0–47.0)
HEMOGLOBIN: 6.5 g/dL — AB (ref 12.0–16.0)
MCH: 28.4 pg (ref 26.0–34.0)
MCHC: 31.9 g/dL — ABNORMAL LOW (ref 32.0–36.0)
MCV: 89.1 fL (ref 80.0–100.0)
PLATELETS: 377 10*3/uL (ref 150–440)
RBC: 2.29 MIL/uL — AB (ref 3.80–5.20)
RDW: 15.1 % — ABNORMAL HIGH (ref 11.5–14.5)
WBC: 4.8 10*3/uL (ref 3.6–11.0)

## 2015-05-15 LAB — COMPREHENSIVE METABOLIC PANEL
A/G RATIO: 1.7 (ref 1.1–2.5)
ALT: 9 IU/L (ref 0–32)
AST: 12 IU/L (ref 0–40)
Albumin: 3.4 g/dL — ABNORMAL LOW (ref 3.5–4.7)
Alkaline Phosphatase: 45 IU/L (ref 39–117)
BUN/Creatinine Ratio: 16 (ref 11–26)
BUN: 13 mg/dL (ref 8–27)
Bilirubin Total: <0.2 mg/dL (ref 0.0–1.2)
CO2: 27 mmol/L (ref 18–29)
Calcium: 7.8 mg/dL — ABNORMAL LOW (ref 8.7–10.3)
Chloride: 101 mmol/L (ref 97–106)
Creatinine, Ser: 0.82 mg/dL (ref 0.57–1.00)
GFR calc Af Amer: 77 mL/min/{1.73_m2} (ref >59)
GFR, EST NON AFRICAN AMERICAN: 67 mL/min/{1.73_m2} (ref >59)
Globulin, Total: 2 g/dL (ref 1.5–4.5)
Glucose: 128 mg/dL — ABNORMAL HIGH (ref 65–99)
Potassium: 4.8 mmol/L (ref 3.5–5.2)
Sodium: 143 mmol/L (ref 136–144)
TOTAL PROTEIN: 5.4 g/dL — AB (ref 6.0–8.5)

## 2015-05-15 LAB — CBC WITH DIFFERENTIAL/PLATELET
Hematocrit: 18.6 % — ABNORMAL LOW (ref 34.0–46.6)
Hemoglobin: 5.2 g/dL — CL (ref 11.1–15.9)
LYMPHS: 19 %
Lymphocytes Absolute: 1.6 10*3/uL (ref 0.7–3.1)
MCH: 28 pg (ref 26.6–33.0)
MCHC: 28 g/dL — ABNORMAL LOW (ref 31.5–35.7)
MCV: 100 fL — AB (ref 79–97)
MID (ABSOLUTE): 1.5 10*3/uL (ref 0.1–1.6)
MID: 19 %
Neutrophils Absolute: 5.2 10*3/uL (ref 1.4–7.0)
Neutrophils: 62 %
PLATELETS: 456 10*3/uL — AB (ref 150–379)
RBC: 1.86 x10E6/uL — AB (ref 3.77–5.28)
RDW: 53.4 % — ABNORMAL HIGH (ref 12.3–15.4)
WBC: 8.3 10*3/uL (ref 3.4–10.8)

## 2015-05-15 LAB — HEMOGLOBIN
Hemoglobin: 6.3 g/dL — ABNORMAL LOW (ref 12.0–16.0)
Hemoglobin: 8.9 g/dL — ABNORMAL LOW (ref 12.0–16.0)

## 2015-05-15 LAB — GLUCOSE, CAPILLARY
GLUCOSE-CAPILLARY: 101 mg/dL — AB (ref 65–99)
Glucose-Capillary: 175 mg/dL — ABNORMAL HIGH (ref 65–99)

## 2015-05-15 LAB — PREPARE RBC (CROSSMATCH)

## 2015-05-15 MED ORDER — INSULIN ASPART 100 UNIT/ML ~~LOC~~ SOLN
0.0000 [IU] | Freq: Three times a day (TID) | SUBCUTANEOUS | Status: DC
  Administered 2015-05-16 (×2): 2 [IU] via SUBCUTANEOUS
  Administered 2015-05-17: 09:00:00 1 [IU] via SUBCUTANEOUS
  Administered 2015-05-17: 2 [IU] via SUBCUTANEOUS
  Administered 2015-05-17 – 2015-05-18 (×2): 1 [IU] via SUBCUTANEOUS
  Filled 2015-05-15: qty 2
  Filled 2015-05-15 (×2): qty 1
  Filled 2015-05-15: qty 2
  Filled 2015-05-15 (×2): qty 1
  Filled 2015-05-15: qty 2

## 2015-05-15 MED ORDER — SODIUM CHLORIDE 0.9 % IV SOLN
Freq: Once | INTRAVENOUS | Status: AC
  Administered 2015-05-15: 11:00:00 via INTRAVENOUS

## 2015-05-15 NOTE — Consult Note (Signed)
[facility]  [address] [place], [place] [address] Phone: [phone] Fax : [phone]  Consultation  Referring Provider:     No ref. provider found Primary Care Physician:  Park Liter, DO Primary Gastroenterologist:  Dr. Vira Agar         Reason for Consultation:     Anemia  Date of Admission:  05/14/2015 Date of Consultation:  05/15/2015         HPI:   Theresa Vasquez is a 81 y.o. female who was admitted with anemia. The patient has a history of iron deficiency and had been admitted back in January for anemia at that time. 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 The AVM was in the proximal small bowel and it was discussed whether or not to do an EGD on the patient at that time. The patient now comes in with a hemoglobin around 5 with a hemoglobin of 10  About 1 month ago. I have spoken to the patient who is demented and not able to give much history. The patient states that she has not taken any anti-inflammatory medications and denies any black stools or bloody stools. The patient know she is at the hospital but is not aware of the date year or why she is here.  Past Medical History  Diagnosis Date  . Anemia   . Diabetes mellitus without complication ([place])   . Depression   . Hypertension   . Colitis   . Diverticulitis   . Hypothyroidism   . Hyperlipidemia   . PVD (peripheral vascular disease) ([place]) 05/14/2015  . CHF (congestive heart failure) ([place])   . Dementia   . Paroxysmal atrial fibrillation (HCC)     s/p cardioversion  . Iron deficiency anemia     Past Surgical History  Procedure Laterality Date  . Abdominal hysterectomy      partial    Prior to Admission medications   Medication Sig Start Date End Date Taking? Authorizing Provider  albuterol (PROVENTIL) (2.5 MG/3ML) 0.083% nebulizer solution Take 2.5 mg by nebulization every 4 (four) hours as needed for wheezing or shortness of breath.    Yes Historical Provider, MD  amiodarone (PACERONE) 200 MG tablet Take 200 mg by mouth daily.    Yes Historical Provider, MD  apixaban (ELIQUIS) 5 MG TABS tablet Take 5 mg by mouth 2 (two) times daily.   Yes Historical Provider, MD  divalproex (DEPAKOTE SPRINKLE) 125 MG capsule Take 250 mg by mouth 3 (three) times daily.    Yes Historical Provider, MD  donepezil (ARICEPT) 10 MG tablet Take 1 tablet (10 mg total) by mouth at bedtime. 01/17/15  Yes Megan P Johnson, DO  enalapril (VASOTEC) 2.5 MG tablet Take 5 mg by mouth 2 (two) times daily.    Yes Historical Provider, MD  famotidine (PEPCID) 20 MG tablet Take 20 mg by mouth 2 (two) times daily.   Yes Historical Provider, MD  ferrous sulfate 325 (65 FE) MG tablet Take 325 mg by mouth daily.    Yes Historical Provider, MD  Fluticasone-Salmeterol (ADVAIR) 250-50 MCG/DOSE AEPB Inhale 1 puff into the lungs 2 (two) times daily.   Yes Historical Provider, MD  furosemide (LASIX) 40 MG tablet Take 40 mg by mouth 2 (two) times daily.   Yes Historical Provider, MD  gabapentin (NEURONTIN) 400 MG capsule Take 1 capsule (400 mg total) by mouth 2 (two) times daily. 01/17/15  Yes [place], DO  levothyroxine (SYNTHROID, LEVOTHROID) 150 MCG tablet Take 1 tablet (150 mcg total) by mouth daily before breakfast. 05/02/15  Yes Megan P Johnson, DO  LORazepam (ATIVAN) 0.5 MG tablet Take 1 tablet (0.5 mg total) by mouth daily. Patient taking differently: Take 0.5 mg by mouth daily as needed for anxiety.  02/05/15  Yes Megan P Johnson, DO  Melatonin 3 MG TABS Take 3 mg by mouth at bedtime.    Yes Historical Provider, MD  metFORMIN (GLUCOPHAGE) 500 MG tablet Take 0.5 tablets (250 mg total) by mouth daily with breakfast. 12/20/14  Yes Megan P Johnson, DO  metoprolol (LOPRESSOR) 50 MG tablet Take 25 mg by mouth 2 (two) times daily.    Yes Historical Provider, MD  pantoprazole (PROTONIX) 40 MG tablet Take 1 tablet (40 mg total) by mouth 2 (two) times daily. 01/07/15  Yes Megan P  Johnson, DO  potassium chloride SA (K-DUR,KLOR-CON) 20 MEQ tablet Take 20 mEq by mouth 2 (two) times daily.   Yes Historical Provider, MD  tiotropium (SPIRIVA) 18 MCG inhalation capsule Place 18 mcg into inhaler and inhale daily.   Yes Historical Provider, MD  traMADol (ULTRAM) 50 MG tablet Take 50 mg by mouth every 6 (six) hours as needed for moderate pain.    Yes Historical Provider, MD  venlafaxine XR (EFFEXOR XR) 37.5 MG 24 hr capsule Take 1 capsule (37.5 mg total) by mouth daily with breakfast. 12/21/14  Yes [place], DO    Family History  Problem Relation Age of Onset  . Diabetes Mother   . Diabetes Father      Social History  Substance Use Topics  . Smoking status: Former Research scientist (life sciences)  . Smokeless tobacco: Never Used  . Alcohol Use: No    Allergies as of 05/14/2015  . (No Known Allergies)    Review of Systems:    All systems reviewed and negative except where noted in HPI.   Physical Exam:  Vital signs in last 24 hours: Temp:  [98.3 F (36.8 C)-99.7 F (37.6 C)] 98.8 F (37.1 C) (10/26 1448) Pulse Rate:  [62-83] 69 (10/26 1448) Resp:  [16-26] 20 (10/26 1448) BP: (102-148)/(43-99) 148/58 mmHg (10/26 1448) SpO2:  [96 %-100 %] 96 % (10/26 1430) Weight:  [195 lb 4.8 oz (88.587 kg)] 195 lb 4.8 oz (88.587 kg) (10/25 1936) Last BM Date: 05/20/15 General:   Pleasant, cooperative in NAD Head:  Normocephalic and atraumatic. Eyes:   No icterus.   Conjunctiva pink. PERRLA. Ears:  Normal auditory acuity. Neck:  Supple; no masses or thyroidomegaly Lungs: Respirations even and unlabored. Lungs clear to auscultation bilaterally.   No wheezes, crackles, or rhonchi.  Heart:  Regular rate and rhythm;  Without murmur, clicks, rubs or gallops Abdomen:  Soft, nondistended, nontender. Normal bowel sounds. No appreciable masses or hepatomegaly.  No rebound or guarding.  Rectal:  Not performed. Msk:  Symmetrical without gross deformities.  Strength  Extremities:  Without edema, cyanosis  or clubbing. Neurologic:  Alert and oriented x3;  grossly normal neurologically. Skin:  Intact without significant lesions or rashes. Cervical Nodes:  No significant cervical adenopathy. Psych:  Alert and cooperative. Normal affect.  LAB RESULTS:  Recent Labs  05/14/15 1349 05/14/15 1526 05/14/15 2230 05/15/15 0435  WBC 8.3 7.2  --  4.8  HGB  --  5.0* 6.3* 6.5*  HCT 18.6* 16.5*  --  20.4*  PLT  --  400  --  377   BMET  Recent Labs  05/14/15 1349 05/14/15 1526  05/15/15 0435  NA 143 140 140  K 4.8 3.8 4.2  CL 101 102 106  CO2 27 27 28   GLUCOSE 128* 153* 205*  BUN 13 17 18   CREATININE 0.82 0.94 0.76  CALCIUM 7.8* 7.9* 7.7*   LFT  Recent Labs  05/14/15 1526  PROT 6.0*  ALBUMIN 2.9*  AST 19  ALT 11*  ALKPHOS 31  BILITOT <0.1*   PT/INR  Recent Labs  05/14/15 1526  LABPROT 17.2*  INR 1.38    STUDIES: Dg Chest 2 View  05/14/2015  CLINICAL DATA:  81 year old female with generalized weakness for 1-2 weeks. Blood in the stool yesterday. EXAM: CHEST  2 VIEW COMPARISON:  Chest x-ray 10/13/2014. FINDINGS: There is cephalization of the pulmonary vasculature and slight indistinctness of the interstitial markings suggestive of mild pulmonary edema. No pleural effusions. Mild cardiomegaly. Upper mediastinal contours are within normal limits. Atherosclerosis in the thoracic aorta. IMPRESSION: 1. The appearance the chest suggests mild congestive heart failure, as above. 2. Atherosclerosis. Electronically Signed   By: Vinnie Langton M.D.   On: 05/14/2015 18:48      Impression / Plan:   Theresa Vasquez is a 81 y.o. y/o female with anemia from acute blood loss. The patient has had a bleed in the past in January without a source found. The patient will be set up for an upper endoscopy. I have spoke to the son who  agrees with the plan of doing an upper endoscopy on the patient tomorrow. Pending the results of this will determine if any further GI workup is needed.    Thank you for involving me in the care of this patient.      LOS: 1 day   Ollen Bowl, MD  05/15/2015, 3:28 PM   Note: This dictation was prepared with Dragon dictation along with smaller phrase technology. Any transcriptional errors that result from this process are unintentional.

## 2015-05-15 NOTE — Telephone Encounter (Signed)
Refill request for enalopril, depakote, metformin and gabapentin sent to [facility]

## 2015-05-15 NOTE — Progress Notes (Signed)
[facility] [place] NAME: Theresa Vasquez    MR#:  [id number]  DATE OF BIRTH:  1931/06/04  SUBJECTIVE:  CHIEF COMPLAINT:   Chief Complaint  Patient presents with  . Weakness  feeling weak, difficulty hearing. Denies any further bleeding. Getting 1st unit of PRBC now REVIEW OF SYSTEMS:  Review of Systems  Constitutional: Positive for malaise/fatigue. Negative for fever, weight loss and diaphoresis.  HENT: Positive for hearing loss. Negative for ear discharge, ear pain, nosebleeds, sore throat and tinnitus.   Eyes: Negative for blurred vision and pain.  Respiratory: Negative for cough, hemoptysis, shortness of breath and wheezing.   Cardiovascular: Negative for chest pain, palpitations, orthopnea and leg swelling.  Gastrointestinal: Positive for heartburn and blood in stool. Negative for nausea, vomiting, abdominal pain, diarrhea and constipation.  Genitourinary: Negative for dysuria, urgency and frequency.  Musculoskeletal: Negative for myalgias and back pain.  Skin: Negative for itching and rash.  Neurological: Positive for weakness. Negative for dizziness, tingling, tremors, focal weakness, seizures and headaches.  Psychiatric/Behavioral: Negative for depression. The patient is nervous/anxious.    DRUG ALLERGIES:  No Known Allergies VITALS:  Blood pressure 111/59, pulse 72, temperature 98.3 F (36.8 C), temperature source Oral, resp. rate 20, height 5\' 3"  (1.6 m), weight 88.587 kg (195 lb 4.8 oz), SpO2 99 %. PHYSICAL EXAMINATION:  Physical Exam  Constitutional: She is oriented to person, place, and time and well-developed, well-nourished, and in no distress.  HENT:  Head: Normocephalic and atraumatic.  Eyes: Conjunctivae and EOM are normal. Pupils are equal, round, and reactive to light.  Neck: Normal range of motion. Neck supple. No tracheal deviation present. No thyromegaly present.  Cardiovascular: Normal rate and regular  rhythm.   Murmur (SEM ) heard. Pulmonary/Chest: Effort normal and breath sounds normal. No respiratory distress. She has no wheezes. She exhibits no tenderness.  Abdominal: Soft. Bowel sounds are normal. She exhibits no distension. There is no tenderness.  Musculoskeletal: Normal range of motion.  Neurological: She is alert and oriented to person, place, and time. No cranial nerve deficit.  Skin: Skin is warm and dry. No rash noted.  Psychiatric: Mood and affect normal.   LABORATORY PANEL:   CBC  Recent Labs Lab 05/15/15 0435  WBC 4.8  HGB 6.5*  HCT 20.4*  PLT 377   ------------------------------------------------------------------------------------------------------------------ Chemistries   Recent Labs Lab 05/14/15 1526 05/15/15 0435  NA 140 140  K 3.8 4.2  CL 102 106  CO2 27 28  GLUCOSE 153* 205*  BUN 17 18  CREATININE 0.94 0.76  CALCIUM 7.9* 7.7*  AST 19  --   ALT 11*  --   ALKPHOS 47  --   BILITOT <0.1*  --    RADIOLOGY:  Dg Chest 2 View  05/14/2015  CLINICAL DATA:  81 year old female with generalized weakness for 1-2 weeks. Blood in the stool yesterday. EXAM: CHEST  2 VIEW COMPARISON:  Chest x-ray 10/13/2014. FINDINGS: There is cephalization of the pulmonary vasculature and slight indistinctness of the interstitial markings suggestive of mild pulmonary edema. No pleural effusions. Mild cardiomegaly. Upper mediastinal contours are within normal limits. Atherosclerosis in the thoracic aorta. IMPRESSION: 1. The appearance the chest suggests mild congestive heart failure, as above. 2. Atherosclerosis. Electronically Signed   By: Vinnie Langton M.D.   On: 05/14/2015 18:48   ASSESSMENT AND PLAN:  Theresa Vasquez is a 81 y.o. female with a known history of diabetes, hypertension, congestive heart failure, dementia and paroxysmal  atrial fibrillation status post cardioversion on eliquis comes to the hospital from PCP office secondary to low hemoglobin.  #1 Anemia-  acute on chronic anemia - likely GI source from being on eliquis - hold Eliquis, 2units Tx - very slowly as had pulmonary edema in the past. Just getting 1st unit when I saw her this am - monitor,  - IV Protonix, liquid diet - GI consult pending, likely no procedures as has been on eliquis -Hemoglobin check every 8 hours. No active bleeding at this time.  #2 Chronic diastolic congestive heart failure- - echocardiogram showed normal LV function.  - well compensated at this time.  #3 dementia and depression-seems to be at baseline. -Typical history of sundowning. Also gets emotional and cries a lot when she is upset. -Continue Effexor, Ativan, Aricept -Also on Depakote for mood stability  #4 atrial fibrillation-paroxysmal, status post cardioversion. Currently in sinus rhythm. -Continue amiodarone and also to prolonged -Hold eliquis at this time.  #5 DVT prophylaxis-Ted's and SCDs     All the records are reviewed and case discussed with Care Management/Social Worker. Management plans discussed with the patient, family and they are in agreement.  CODE STATUS: Full Code  TOTAL TIME TAKING CARE OF THIS PATIENT: 35 minutes.   More than 50% of the time was spent in counseling/coordination of care: YES  POSSIBLE D/C IN 1-2 DAYS, DEPENDING ON CLINICAL CONDITION.   [facility], Tailor Westfall M.D on 05/15/2015 at 1:02 PM  Between 7am to 6pm - Pager - [phone]  After 6pm go to [email or web address] - password [email or web address] [facility]  Office  [phone]  CC: Primary care physician; Park Liter, DO

## 2015-05-15 NOTE — Progress Notes (Signed)
*  PRELIMINARY RESULTS* Echocardiogram 2D Echocardiogram has been performed.  Theresa Vasquez 05/15/2015, 9:47 AM

## 2015-05-15 NOTE — Progress Notes (Signed)
PT Hold Note  Patient Details Name: Theresa Vasquez MRN: [id number] DOB: 06-Jan-1931   Cancelled Treatment:    Reason Eval/Treat Not Completed: Medical issues which prohibited therapy. Chart reviewed and RN consulted. Hb low and pt is about to start a transfusion. PT evaluation will be attempted at later time/date as patient is appropriate.  Lyndel Safe Erik Nessel PT, DPT   Nilson Tabora 05/15/2015, 11:28 AM

## 2015-05-15 NOTE — Progress Notes (Signed)
Inpatient Diabetes Program Recommendations  AACE/ADA: New Consensus Statement on Inpatient Glycemic Control (2015)  Target Ranges:  Prepandial:   less than 140 mg/dL      Peak postprandial:   less than 180 mg/dL (1-2 hours)      Critically ill patients:  140 - 180 mg/dL  Results for DINNA, SEVERS (MRN [id number]) as of 05/15/2015 10:21  Ref. Range 05/14/2015 13:49 05/14/2015 15:26 05/15/2015 04:35  Glucose Latest Ref Range: 65-99 mg/dL 128 (H) 153 (H) 205 (H)   Review of Glycemic Control  Diabetes history: DM2 Outpatient Diabetes medications: Metformin 250 mg QAM Current orders for Inpatient glycemic control: Metformin 250 mg QAM  Inpatient Diabetes Program Recommendations: Correction (SSI): While inpatient, please consider ordering CBGs with Novolog sensitive correction scale ACHS.  NOTE: Patient received a one time dose of Solumedrol 125 mg on 05/13/15 at 18:01 which is likely cause of hyperglycemia.   Thanks, Barnie Alderman, RN, MSN, CCRN, CDE Diabetes Coordinator Inpatient Diabetes Program [phone] (Team Pager from [place] to [place]) [phone] ([facility] office) [phone] [facility] office) [phone] [facility] office)

## 2015-05-15 NOTE — Care Management (Signed)
Admitted to [facility] with the diagnosis of anemia. Lives alone. Son is Elberta Fortis Nicole [facility]red). [phone].  "It's been awhile since she seen Dr. Jeananne Rama." Noted in Epic that she did call for an appointment on 05/13/15. No home health. No skilled facility. No home oxygen. Doesn't have Life Alert in the home. Uses no aids for ambulation. Takes care of all basic and instrumental activities of daily living herself, drives. No falls. Good appetite. Son will transport.  Shelbie Ammons RN MSN CCM

## 2015-05-15 NOTE — Plan of Care (Signed)
Problem: Discharge Progression Outcomes Goal: Other Discharge Outcomes/Goals Outcome: Progressing Plan of care progress to goal: VSS Pt gets up to [facility] with assistance Son to bring in advanced directive copies today. Hgb being checked Q8H

## 2015-05-15 NOTE — Telephone Encounter (Signed)
I called patient to follow-up on medication refill. Not realizing that she was transported to the ER 05/14/2015 for a very RBC count. Son answered and was very nice. I explained my apologies and told him I was following up with phone calls. I asked then asked him if the patient was ok and he said everything went fine (he said better than last time) she got a blood transfusion and was still there but is doing good. He explained when she got out of the hospital they were going to make an appointment with Dr. Wynetta Emery for a follow up. I told him if Theresa Vasquez needed anything or had any questions to please give [place] a call.

## 2015-05-16 ENCOUNTER — Inpatient Hospital Stay: Payer: Medicare Other

## 2015-05-16 ENCOUNTER — Encounter
Admission: EM | Disposition: A | Discharge status: Home Health | Payer: Self-pay | Source: Home / Self Care | Attending: Internal Medicine

## 2015-05-16 LAB — URINALYSIS COMPLETE WITH MICROSCOPIC (ARMC ONLY)
BILIRUBIN URINE: NEGATIVE
GLUCOSE, UA: NEGATIVE mg/dL
Hgb urine dipstick: NEGATIVE
Ketones, ur: NEGATIVE mg/dL
Nitrite: NEGATIVE
Protein, ur: NEGATIVE mg/dL
RBC / HPF: NONE SEEN RBC/hpf (ref 0–5)
Specific Gravity, Urine: 1.011 (ref 1.005–1.030)
Squamous Epithelial / LPF: NONE SEEN
pH: 6 (ref 5.0–8.0)

## 2015-05-16 LAB — CBC
HCT: 33 % — ABNORMAL LOW (ref 35.0–47.0)
HEMOGLOBIN: 10.1 g/dL — AB (ref 12.0–16.0)
MCH: 27.2 pg (ref 26.0–34.0)
MCHC: 30.6 g/dL — AB (ref 32.0–36.0)
MCV: 88.9 fL (ref 80.0–100.0)
PLATELETS: 484 10*3/uL — AB (ref 150–440)
RBC: 3.71 MIL/uL — ABNORMAL LOW (ref 3.80–5.20)
RDW: 15 % — ABNORMAL HIGH (ref 11.5–14.5)
WBC: 16.8 10*3/uL — ABNORMAL HIGH (ref 3.6–11.0)

## 2015-05-16 LAB — OCCULT BLOOD X 1 CARD TO LAB, STOOL: FECAL OCCULT BLD: POSITIVE — AB

## 2015-05-16 LAB — GLUCOSE, CAPILLARY
GLUCOSE-CAPILLARY: 190 mg/dL — AB (ref 65–99)
GLUCOSE-CAPILLARY: 175 mg/dL — AB (ref 65–99)
Glucose-Capillary: 171 mg/dL — ABNORMAL HIGH (ref 65–99)
Glucose-Capillary: 109 mg/dL — ABNORMAL HIGH (ref 65–99)

## 2015-05-16 LAB — BLOOD GAS, ARTERIAL
ACID-BASE EXCESS: 8.7 mmol/L — AB (ref 0.0–3.0)
Allens test (pass/fail): POSITIVE — AB
BICARBONATE: 32.8 meq/L — AB (ref 21.0–28.0)
FIO2: 1
O2 Saturation: 99.8 %
PCO2 ART: 42 mmHg (ref 32.0–48.0)
PH ART: 7.5 — AB (ref 7.350–7.450)
PO2 ART: 223 mmHg — AB (ref 83.0–108.0)
Patient temperature: 37

## 2015-05-16 LAB — BASIC METABOLIC PANEL
Anion gap: 9 (ref 5–15)
BUN: 21 mg/dL — AB (ref 6–20)
CALCIUM: 8.2 mg/dL — AB (ref 8.9–10.3)
CHLORIDE: 100 mmol/L — AB (ref 101–111)
CO2: 30 mmol/L (ref 22–32)
CREATININE: 0.88 mg/dL (ref 0.44–1.00)
GFR, EST NON AFRICAN AMERICAN: 60 mL/min — AB (ref >60)
Glucose, Bld: 196 mg/dL — ABNORMAL HIGH (ref 65–99)
Potassium: 4.1 mmol/L (ref 3.5–5.1)
SODIUM: 139 mmol/L (ref 135–145)

## 2015-05-16 SURGERY — ESOPHAGOGASTRODUODENOSCOPY (EGD) WITH PROPOFOL
Anesthesia: General

## 2015-05-16 MED ORDER — FUROSEMIDE 10 MG/ML IJ SOLN
40.0000 mg | Freq: Once | INTRAMUSCULAR | Status: AC
  Administered 2015-05-16: 40 mg via INTRAVENOUS

## 2015-05-16 MED ORDER — FUROSEMIDE 10 MG/ML IJ SOLN
INTRAMUSCULAR | Status: AC
  Filled 2015-05-16: qty 4

## 2015-05-16 MED ORDER — ACETAMINOPHEN 325 MG PO TABS: 650.0000 mg | ORAL_TABLET | Freq: Four times a day (QID) | ORAL | Status: DC | PRN

## 2015-05-16 MED ORDER — FUROSEMIDE 10 MG/ML IJ SOLN
INTRAMUSCULAR | Status: AC
  Administered 2015-05-16: 40 mg via INTRAVENOUS
  Filled 2015-05-16: qty 4

## 2015-05-16 MED ORDER — LEVOFLOXACIN 500 MG PO TABS
500.0000 mg | ORAL_TABLET | Freq: Every day | ORAL | Status: DC
  Administered 2015-05-16 – 2015-05-17 (×2): 500 mg via ORAL
  Filled 2015-05-16 (×2): qty 1

## 2015-05-16 MED ORDER — FUROSEMIDE 10 MG/ML IJ SOLN
40.0000 mg | Freq: Once | INTRAMUSCULAR | Status: AC
  Administered 2015-05-16: 05:00:00 40 mg via INTRAVENOUS

## 2015-05-16 MED ORDER — LORAZEPAM 0.5 MG PO TABS
0.5000 mg | ORAL_TABLET | Freq: Four times a day (QID) | ORAL | Status: DC | PRN
  Administered 2015-05-16 – 2015-05-18 (×2): 0.5 mg via ORAL
  Filled 2015-05-16 (×2): qty 1

## 2015-05-16 MED ORDER — PANTOPRAZOLE SODIUM 40 MG PO TBEC
40.0000 mg | DELAYED_RELEASE_TABLET | Freq: Two times a day (BID) | ORAL | Status: DC
  Administered 2015-05-16 – 2015-05-18 (×4): 40 mg via ORAL
  Filled 2015-05-16 (×4): qty 1

## 2015-05-16 NOTE — Progress Notes (Signed)
   05/16/15 0500  Clinical Encounter Type  Visited With Patient;Health care provider  Visit Type Code  Referral From Nurse  Consult/Referral To Chaplain  Spiritual Encounters  Spiritual Needs Emotional  Stress Factors  Patient Stress Factors Health changes  Chaplain received a medical alert page and reported to patient's room to offer support and a compassionate presence to patient and staff. Chaplain Kavonte Bearse A. Juno Bozard Ext. [phone]

## 2015-05-16 NOTE — Evaluation (Signed)
Physical Therapy Evaluation Patient Details Name: Theresa Vasquez MRN: [id number] DOB: Nov 19, 1930 Today's Date: 05/16/2015   History of Present Illness  Pt is an 81 y.o. female presenting to hospital with weakness and anemia; pt s/p blood transfusion 05/15/15.  Rapid Response called AM 05/16/15 (O2 sats in 70's).  PMH includes dementia, htn, CHF, diabetes, paroxysmal a-fib s/p cardioversion on Eliquis; also per imaging 12/11/14: fx head of 4th metatarsal (pt reports no restrictions, no pain, and has been walking on it).  Clinical Impression  Currently pt demonstrates impairments with general strength, balance, activity tolerance, and limitations with functional mobility.  Prior to admission, pt reports being independent with functional mobility without AD but had caregiver and family assist for bathing, cleaning, meals, etc.  Pt reports living alone on main level of home with 4 steps to enter with B railing.  Pt oriented to self and place but not to situation and date during session and no family present to verify PLOF.  Currently pt is mod assist for bed mobility, transfers, and taking a few steps bed to/from bedside commode.  Pt would benefit from skilled PT to address above noted impairments and functional limitations.  Recommend pt discharge to STR when medically appropriate.     Follow Up Recommendations SNF    Equipment Recommendations   (TBD)    Recommendations for Other Services       Precautions / Restrictions Precautions Precautions: Fall Precaution Comments: Monitor O2 Restrictions Weight Bearing Restrictions: No      Mobility  Bed Mobility Overal bed mobility: Needs Assistance Bed Mobility: Supine to Sit;Sit to Supine     Supine to sit: Mod assist (assist for trunk) Sit to supine: Mod assist (assist for B LE's)   General bed mobility comments: increased time to perform; HOB elevated; use of side rail  Transfers Overall transfer level: Needs assistance    Transfers: Sit to/from Stand Sit to Stand: Mod assist         General transfer comment: sit to/from stand from bed 1x and from bedside commode 2x's;  pt stood with mod assist approximately 1 minute for clean-up after toileting (pt requiring assist for upright posture)  Ambulation/Gait Ambulation/Gait assistance: Min assist;Mod assist Ambulation Distance (Feet):  (bed to commode and back to bed; 3 feet x2) Assistive device: None   Gait velocity: decreased   General Gait Details: decreased B step length/foot clearance/heelstrike; vc's for upright posture required  Stairs            Wheelchair Mobility    Modified Rankin (Stroke Patients Only)       Balance Overall balance assessment: Needs assistance Sitting-balance support: Bilateral upper extremity supported;Feet supported Sitting balance-Leahy Scale: Good     Standing balance support: During functional activity Standing balance-Leahy Scale: Poor Standing balance comment: standing for clean-up after toileting                             Pertinent Vitals/Pain Pain Assessment: No/denies pain  Vitals stable and WFL throughout treatment session on 3 L/min O2 via nasal cannula.    Home Living Family/patient expects to be discharged to:: Private residence Living Arrangements: Alone (pt reports having temporary 24 hour caregiver) Available Help at Discharge:  (24 hour caregiver (does not physically assist pt for functional mobility though)) Type of Home: House Home Access: Stairs to enter Entrance Stairs-Rails: Right;Left;Can reach both Entrance Stairs-Number of Steps: 4 Home Layout: Two level;Able to live on  main level with bedroom/bathroom Home Equipment: Walker - 4 wheels;Shower seat      Prior Function Level of Independence: Needs assistance   Gait / Transfers Assistance Needed: Pt reports being independent with ambulation without AD.  ADL's / Homemaking Assistance Needed: Pt has caregiver that  assists with cleaning, meals, bathing, etc (pt's son and daughter also helps with this)        Hand Dominance        Extremity/Trunk Assessment   Upper Extremity Assessment: Generalized weakness           Lower Extremity Assessment: Generalized weakness         Communication   Communication: No difficulties  Cognition Arousal/Alertness: Awake/alert Behavior During Therapy: WFL for tasks assessed/performed Overall Cognitive Status: No family/caregiver present to determine baseline cognitive functioning (Pt oriented to person and place but not time or situation)                      General Comments   Nursing cleared pt for participation in physical therapy.  Pt agreeable to PT session.    Exercises  Therapeutic activity:  Pt required vc's and extra time for transfer technique to/from commode, vc's for hand placement, and also required vc's for standing upright during transfers.        Assessment/Plan    PT Assessment Patient needs continued PT services  PT Diagnosis Difficulty walking;Generalized weakness   PT Problem List Decreased strength;Decreased activity tolerance;Decreased balance;Decreased mobility;Decreased knowledge of precautions;Decreased knowledge of use of DME  PT Treatment Interventions DME instruction;Gait training;Stair training;Functional mobility training;Therapeutic activities;Therapeutic exercise;Balance training;Neuromuscular re-education;Patient/family education;Manual techniques   PT Goals (Current goals can be found in the Care Plan section) Acute Rehab PT Goals Patient Stated Goal: to get some sleep PT Goal Formulation: With patient Time For Goal Achievement: 05/30/15 Potential to Achieve Goals: Good    Frequency Min 2X/week   Barriers to discharge Decreased caregiver support      Co-evaluation               End of Session Equipment Utilized During Treatment: Gait belt;Oxygen Activity Tolerance: Patient limited by  fatigue Patient left: in bed;with call bell/phone within reach;with bed alarm set;with nursing/sitter in room Nurse Communication: Mobility status         Time: 0934-1000 PT Time Calculation (min) (ACUTE ONLY): 26 min   Charges:   PT Evaluation $Initial PT Evaluation Tier I: 1 Procedure PT Treatments $Therapeutic Activity: 8-22 mins   PT G CodesLeitha Bleak 2015/06/05, 10:24 AM [name], [place]

## 2015-05-16 NOTE — Progress Notes (Signed)
Called to pts room after nebulizer treatment was given for wheezing.  Pt was having increased work of breathing with O2s in the 70's.  Rapid response called.  Dr. Jannifer Franklin approved an order for 40mg  of lasix.  Non rebreather added and O2 improved to 100%.  Dr. Estanislado Pandy ordered another 40mg  of lasix, a foley catheter, ABGs and a chest xray to be done.  Waiting on ABGs before deciding on transfer to unit.

## 2015-05-16 NOTE — Plan of Care (Signed)
Problem: Discharge Progression Outcomes Goal: Other Discharge Outcomes/Goals Outcome: Progressing Pt is stable after rapid response called last night.  O2 sats are 98% on 3L [place].  Believe fluid overload was caused by 3 units of PRBC in last couple of days.  Pt improved w/2 doses of IV lasix - continuing PO lasix.  She has acute on chronic anemia - thus she rec'd 3 U PRBC. Hgb improved to 10.1 yesterday.  Pt is positive for occult blood.  EGD was cancelled b/c of pt instability.  If Hgb remains stable, may be done out pt.  Dr believes anemia likely from GI caused by being on elequis.  Dr. Delano Metz think she's a good candidate for anticoags.   Pt very tearful and agitated this am - mainly b/c she was NPO for procedure.  Things improved when she could eat and drink again.  Got order for PRN ativan q6 PRN and depakote for mood stability.  Pt worked w/PT today.  Mod assist - 1 person assist to [facility].  Continues to have foley.

## 2015-05-16 NOTE — Progress Notes (Signed)
Paged Dr. Jannifer Franklin because pt was tearful and insistent on drinking water.  She has an EGD scheduled for today and is NPO.  MD approved one sip of water.

## 2015-05-16 NOTE — Care Management Important Message (Signed)
Important Message  Patient Details  Name: MANAMI TUTOR MRN: [id number] Date of Birth: 1931-06-28   Medicare Important Message Given:  Yes-second notification given    Darius Bump Allmond 05/16/2015, 8:39 AM

## 2015-05-16 NOTE — Progress Notes (Signed)
Spoke with Dr. Estanislado Pandy about ABGs and chest xray results.  Reported that pt is back on nasal cannula and sats are at 95%.  He is ok with continuing the nasal cannula.  Pt is more responsive and alert.  Urine is clear.

## 2015-05-16 NOTE — Progress Notes (Signed)
[facility] [place] NAME: Theresa Vasquez    MR#:  [id number]  DATE OF BIRTH:  [birth year]  SUBJECTIVE:  CHIEF COMPLAINT:   Chief Complaint  Patient presents with  . Weakness   -Patient very emotional, crying this morning. She has dementia and whenever she gets upset, she cries. -Was hypoxic last night with pulmonary edema after 2 units of blood transfusion throughout the day yesterday. He required nonrebreather mask, currently on 3 L oxygen. Appears more stable. -Hemoglobin at 10.1, received total of 3 units transfusion this admission  REVIEW OF SYSTEMS:  Review of Systems  Constitutional: Negative for fever and chills.  Respiratory: Negative for cough, shortness of breath and wheezing.   Cardiovascular: Negative for chest pain and palpitations.  Gastrointestinal: Negative for nausea, vomiting, abdominal pain, diarrhea and constipation.  Genitourinary: Negative for dysuria.  Neurological: Negative for dizziness, seizures and headaches.       Confused  Psychiatric/Behavioral: The patient is nervous/anxious.     DRUG ALLERGIES:  No Known Allergies  VITALS:  Blood pressure 120/48, pulse 84, temperature 98.8 F (37.1 C), temperature source Oral, resp. rate 18, height 5\' 3"  (1.6 m), weight 88.587 kg (195 lb 4.8 oz), SpO2 96 %.  PHYSICAL EXAMINATION:  Physical Exam  GENERAL: 81 y.o.-year-old elderly patient lying in the bed with no acute distress.  EYES: Pupils equal, round, reactive to light and accommodation. No scleral icterus. Extraocular muscles intact. Wearing glasses Pallor conjunctiva HEENT: Head atraumatic, normocephalic. Oropharynx and nasopharynx clear.  NECK: Supple, no jugular venous distention. No thyroid enlargement, no tenderness.  LUNGS: Normal breath sounds bilaterally, no wheezing, rales,rhonchi or crepitation. No use of accessory muscles of respiration. Decreased bibasilar breath sounds CARDIOVASCULAR:  S1, S2 normal. No rubs, or gallops. 3/6 systolic murmur present ABDOMEN: Soft, nontender, nondistended. Bowel sounds present. No organomegaly or mass.  EXTREMITIES: No pedal edema, cyanosis, or clubbing.  NEUROLOGIC: Cranial nerves II through XII are intact. Muscle strength 5/5 in all extremities. Sensation intact. Gait not checked.  PSYCHIATRIC: The patient is alert and oriented x 1-2.  SKIN: No obvious rash, lesion, or ulcer.   LABORATORY PANEL:   CBC  Recent Labs Lab 05/16/15 0415  WBC 16.8*  HGB 10.1*  HCT 33.0*  PLT 484*   ------------------------------------------------------------------------------------------------------------------  Chemistries   Recent Labs Lab 05/14/15 1526  05/16/15 0415  NA 140  < > 139  K 3.8  < > 4.1  CL 102  < > 100*  CO2 27  < > 30  GLUCOSE 153*  < > 196*  BUN 17  < > 21*  CREATININE 0.94  < > 0.88  CALCIUM 7.9*  < > 8.2*  AST 19  --   --   ALT 11*  --   --   ALKPHOS 47  --   --   BILITOT <0.1*  --   --   < > = values in this interval not displayed. ------------------------------------------------------------------------------------------------------------------  Cardiac Enzymes No results for input(s): TROPONINI in the last 168 hours. ------------------------------------------------------------------------------------------------------------------  RADIOLOGY:  Dg Chest 2 View  05/14/2015  CLINICAL DATA:  81 year old female with generalized weakness for 1-2 weeks. Blood in the stool yesterday. EXAM: CHEST  2 VIEW COMPARISON:  Chest x-ray 10/13/2014. FINDINGS: There is cephalization of the pulmonary vasculature and slight indistinctness of the interstitial markings suggestive of mild pulmonary edema. No pleural effusions. Mild cardiomegaly. Upper mediastinal contours are within normal limits. Atherosclerosis in the thoracic aorta. IMPRESSION: 1.  The appearance the chest suggests mild congestive heart failure, as above. 2.  Atherosclerosis. Electronically Signed   By: Vinnie Langton M.D.   On: 05/14/2015 18:48   Dg Chest Port 1 View  05/16/2015  CLINICAL DATA:  Acute onset of shortness of breath. Initial encounter. EXAM: PORTABLE CHEST 1 VIEW COMPARISON:  Chest radiograph performed 05/14/2015 FINDINGS: The lungs are well-aerated. Diffuse right-sided airspace opacity is noted. Vascular congestion is noted, with more mild left basilar airspace opacity. Small bilateral pleural effusions are suspected. No pneumothorax is seen. This may reflect asymmetric pulmonary edema or pneumonia. The cardiomediastinal silhouette is borderline normal in size. No acute osseous abnormalities are seen. IMPRESSION: Diffuse right-sided airspace opacity noted. Vascular congestion noted, with more mild left basilar airspace opacity. Small bilateral pleural effusions suspected. This may reflect asymmetric pulmonary edema or pneumonia. Electronically Signed   By: Garald Balding M.D.   On: 05/16/2015 04:42    EKG:   Orders placed or performed during the hospital encounter of 05/14/15  . ED EKG  . ED EKG  . EKG 12-Lead  . EKG 12-Lead    ASSESSMENT AND PLAN:   Theresa Vasquez is a 81 y.o. female with a known history of diabetes, hypertension, congestive heart failure, dementia and paroxysmal atrial fibrillation status post cardioversion on eliquis comes to the hospital from PCP office secondary to low hemoglobin.  #1 Anemia- acute on chronic anemia - likely GI source from being on eliquis - hold Eliquis, she probably is not a good candidate for anticoagulation at all. Discussed with son. -Received total units of 3 units transfusion at this admission. Hemoglobin is stable at 10.1. Recheck tomorrow a.m.  - Change Protonix to oral twice a day. Advance her diet. 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 Due for EGD today, but due to her respiratory distress and hypoxia last night, the procedure has been  canceled. -If hemoglobin is stable, likely can be done as an outpatient. -Appreciate GI consult  #2 acute hypoxic respiratory failure-secondary to pulmonary edema -Due to blood transfusion, as patient had history of transfusion reactions in the past. -Required nonrebreather threat that time, currently on 3 L oxygen. -Improved after an extra dose of Lasix. -Continue her oral Lasix today and try to wean off oxygen as tolerated.  #3 Chronic diastolic congestive heart failure-with acute pulm edema from blood Tx yesterday - Improved with an extra dose of lasix - echocardiogram showed normal LV function.  - cont oral lasix bid.  #4 dementia and depression-seems to be at baseline. -Typical history of sundowning. Also gets emotional and cries a lot when she is upset. -Continue Effexor, Ativan, Aricept. Ativan PRN ordered -Also on Depakote for mood stability  #5 atrial fibrillation-paroxysmal, status post cardioversion. Currently in sinus rhythm. -Continue amiodarone and also metoprolol -Discussed with son about holding eliquis at the time of discharge as well and recommended follow-up with outpatient cardiologist.  #5 DVT prophylaxis-Ted's and SCDs  #6 leukocytosis-not sure why her white count shot up. We'll repeat it again. -No fevers, urine was normal on admission -Chest x-ray with pulmonary edema and may be pneumonia as well. -Add Levaquin    All the records are reviewed and case discussed with Care Management/Social Workerr. Management plans discussed with the patient, family and they are in agreement.  CODE STATUS: Full Code  TOTAL TIME TAKING CARE OF THIS PATIENT: 36 minutes.   POSSIBLE D/C IN 1-2 DAYS, DEPENDING ON CLINICAL CONDITION.   Gladstone Lighter M.D on 05/16/2015 at  11:51 AM  Between 7am to 6pm - Pager - [phone]  After 6pm go to [email or web address] - password [email or web address] [facility]  Office  [phone]  CC: Primary care physician; Park Liter, DO

## 2015-05-17 ENCOUNTER — Inpatient Hospital Stay: Payer: Medicare Other

## 2015-05-17 LAB — BASIC METABOLIC PANEL
ANION GAP: 10 (ref 5–15)
BUN: 20 mg/dL (ref 6–20)
CO2: 32 mmol/L (ref 22–32)
Calcium: 8.1 mg/dL — ABNORMAL LOW (ref 8.9–10.3)
Chloride: 98 mmol/L — ABNORMAL LOW (ref 101–111)
Creatinine, Ser: 0.86 mg/dL (ref 0.44–1.00)
Glucose, Bld: 212 mg/dL — ABNORMAL HIGH (ref 65–99)
POTASSIUM: 3.6 mmol/L (ref 3.5–5.1)
SODIUM: 140 mmol/L (ref 135–145)

## 2015-05-17 LAB — GLUCOSE, CAPILLARY
GLUCOSE-CAPILLARY: 181 mg/dL — AB (ref 65–99)
GLUCOSE-CAPILLARY: 142 mg/dL — AB (ref 65–99)
GLUCOSE-CAPILLARY: 129 mg/dL — AB (ref 65–99)
Glucose-Capillary: 162 mg/dL — ABNORMAL HIGH (ref 65–99)
Glucose-Capillary: 156 mg/dL — ABNORMAL HIGH (ref 65–99)

## 2015-05-17 LAB — TYPE AND SCREEN
ABO/RH(D): O POS
ANTIBODY SCREEN: NEGATIVE
UNIT DIVISION: 0
UNIT DIVISION: 0
Unit division: 0
Unit division: 0
Unit division: 0

## 2015-05-17 LAB — CBC
HCT: 28.6 % — ABNORMAL LOW (ref 35.0–47.0)
Hemoglobin: 8.9 g/dL — ABNORMAL LOW (ref 12.0–16.0)
MCH: 27.9 pg (ref 26.0–34.0)
MCHC: 31.2 g/dL — ABNORMAL LOW (ref 32.0–36.0)
MCV: 89.4 fL (ref 80.0–100.0)
PLATELETS: 344 10*3/uL (ref 150–440)
RBC: 3.2 MIL/uL — AB (ref 3.80–5.20)
RDW: 15.1 % — ABNORMAL HIGH (ref 11.5–14.5)
WBC: 7.3 10*3/uL (ref 3.6–11.0)

## 2015-05-17 MED ORDER — LEVOFLOXACIN 500 MG PO TABS: 500.0000 mg | ORAL_TABLET | Freq: Every day | ORAL | Status: DC

## 2015-05-17 MED ORDER — LORAZEPAM 0.5 MG PO TABS: 0.5000 mg | ORAL_TABLET | Freq: Every day | ORAL | Status: DC

## 2015-05-17 MED ORDER — LORAZEPAM 0.5 MG PO TABS: 0.5000 mg | ORAL_TABLET | Freq: Three times a day (TID) | ORAL | Status: DC | PRN

## 2015-05-17 NOTE — Telephone Encounter (Signed)
Patient is currently in the hospital, medications will be refilled when she comes in for her hospital follow up.

## 2015-05-17 NOTE — Progress Notes (Signed)
Patient ID: Theresa Vasquez, female   DOB: 04-14-1931, 81 y.o.   MRN: [id number] GI Inpatient Follow-up Note  Patient Identification: Theresa Vasquez is a 81 y.o. female with GI bleeding.   Subjective:Asked by Dr. Allen Norris to cover over the weekend. Pt with drop in hgb. 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 Originally planned for EGD yesterday with Dr. Allen Norris but cancelled due to resp distress. Breathing better but still on 3L of O2.  Pt unaware of any plans for EGD. Unaware of previous diagnosis of duodenal AVM's. Poor historian. Ate solids today.  Scheduled Inpatient Medications:  . amiodarone  200 mg Oral Daily  . divalproex  250 mg Oral TID  . donepezil  10 mg Oral QHS  . enalapril  5 mg Oral BID  . famotidine  20 mg Oral BID  . ferrous sulfate  325 mg Oral Daily  . furosemide  40 mg Oral BID  . gabapentin  400 mg Oral BID  . insulin aspart  0-9 Units Subcutaneous TID WC  . levofloxacin  500 mg Oral Daily  . levothyroxine  150 mcg Oral QAC breakfast  . LORazepam  0.5 mg Oral QHS  . metFORMIN  250 mg Oral Q breakfast  . metoprolol  25 mg Oral BID  . mometasone-formoterol  2 puff Inhalation BID  . pantoprazole  40 mg Oral BID  . potassium chloride SA  20 mEq Oral BID  . sodium chloride  3 mL Intravenous Q12H  . tiotropium  18 mcg Inhalation Daily  . venlafaxine XR  37.5 mg Oral Q breakfast    Continuous Inpatient Infusions:     PRN Inpatient Medications:  acetaminophen, albuterol, LORazepam, traMADol  Review of Systems: Constitutional: Weight is stable.  Eyes: No changes in vision. ENT: No oral lesions, sore throat.  GI: see HPI.  Heme/Lymph: No easy bruising.  CV: No chest pain.  GU: No hematuria.  Integumentary: No rashes.  Neuro: No headaches.  Psych: No depression/anxiety.  Endocrine: No heat/cold intolerance.  Allergic/Immunologic: No urticaria.  Resp: No cough, SOB.  Musculoskeletal: No joint swelling.    Physical Examination: BP 144/52 mmHg  Pulse  70  Temp(Src) 98.9 F (37.2 C) (Oral)  Resp 20  Ht 5\' 3"  (1.6 m)  Wt 88.587 kg (195 lb 4.8 oz)  BMI 34.60 kg/m2  SpO2 99% Gen: NAD, alert and oriented x 4 HEENT: PEERLA, EOMI, Neck: supple, no JVD or thyromegaly Chest: CTA bilaterally, no wheezes, crackles, or other adventitious sounds CV: RRR, no m/g/c/r Abd: soft, NT, ND, +BS in all four quadrants; no HSM, guarding, ridigity, or rebound tenderness Ext: no edema, well perfused with 2+ pulses, Skin: no rash or lesions noted Lymph: no LAD  Data: Lab Results  Component Value Date   WBC 7.3 05/17/2015   HGB 8.9* 05/17/2015   HCT 28.6* 05/17/2015   MCV 89.4 05/17/2015   PLT 344 05/17/2015    Recent Labs Lab 05/15/15 1809 05/16/15 0415 05/17/15 0542  HGB 8.9* 10.1* 8.9*   Lab Results  Component Value Date   NA 140 05/17/2015   K 3.6 05/17/2015   CL 98* 05/17/2015   CO2 32 05/17/2015   BUN 20 05/17/2015   CREATININE 0.86 05/17/2015   Lab Results  Component Value Date   ALT 11* 05/14/2015   AST 19 05/14/2015   ALKPHOS 47 05/14/2015   BILITOT <0.1* 05/14/2015    Recent Labs Lab 05/14/15 1526  APTT 32  INR 1.38   Assessment/Plan:  Ms. Hemminger is a 81 y.o. female with prob UGI bleeding with drop in hgb.  Recommendations: Continue to moniter hgb. If hgb continues to fall, then plan EGD in the next few days as long as resp status stable. Will follow. Thanks.  Please call with questions or concerns.  Saoirse Legere, Lupita Dawn, MD

## 2015-05-17 NOTE — Progress Notes (Signed)
Physical Therapy Treatment Patient Details Name: AZALIA NEUBERGER MRN: [id number] DOB: 07/01/1931 Today's Date: 05/17/2015    History of Present Illness Pt is an 81 y.o. female presenting to hospital with weakness and anemia; pt s/p 3 blood transfusions since admission.  Rapid Response called AM 05/16/15 (O2 sats in 70's).  PMH includes dementia, htn, CHF, diabetes, paroxysmal a-fib s/p cardioversion on Eliquis; also per imaging 12/11/14: fx head of 4th metatarsal (pt reports no restrictions, no pain, and has been walking on it).    PT Comments    Pt able to progress to ambulation 120 feet with RW; pt required vc's for safe RW use and technique but appeared to overall tolerate activity fairly well (O2 sats >94% on 2 L/min via nasal cannula) although pt did report some fatigue with distance.  Will continue to progress pt per pt tolerance.   Follow Up Recommendations  SNF     Equipment Recommendations  Rolling walker with 5" wheels    Recommendations for Other Services       Precautions / Restrictions Precautions Precautions: Fall Precaution Comments: Monitor O2 Restrictions Weight Bearing Restrictions: No    Mobility  Bed Mobility Overal bed mobility: Needs Assistance Bed Mobility: Supine to Sit     Supine to sit: Min assist;HOB elevated     General bed mobility comments: increased time to perform; HOB elevated; use of side rail  Transfers Overall transfer level: Needs assistance Equipment used: Rolling walker (2 wheeled) Transfers: Sit to/from Stand Sit to Stand: Min assist         General transfer comment: vc's required for hand placement and walker use  Ambulation/Gait Ambulation/Gait assistance: Min guard;Min assist Ambulation Distance (Feet): 120 Feet Assistive device: Rolling walker (2 wheeled)   Gait velocity: decreased   General Gait Details: decreased B step length/foot clearance/heelstrike; pt occasionally taking UE's off of RW and requiring vc's  for safe RW use/technique   Stairs            Wheelchair Mobility    Modified Rankin (Stroke Patients Only)       Balance Overall balance assessment: Needs assistance Sitting-balance support: Bilateral upper extremity supported;Feet supported Sitting balance-Leahy Scale: Good     Standing balance support: Bilateral upper extremity supported (on RW) Standing balance-Leahy Scale: Good                      Cognition Arousal/Alertness: Awake/alert Behavior During Therapy: WFL for tasks assessed/performed Overall Cognitive Status: No family/caregiver present to determine baseline cognitive functioning (Pt oriented to person and place but not time or situation)                      Exercises      General Comments   Nursing cleared pt for participation in physical therapy.  Pt agreeable to PT session.      Pertinent Vitals/Pain Pain Assessment: No/denies pain  Vitals stable and WFL throughout treatment session.    Home Living                      Prior Function            PT Goals (current goals can now be found in the care plan section) Acute Rehab PT Goals Patient Stated Goal: to walk more PT Goal Formulation: With patient Time For Goal Achievement: 05/30/15 Potential to Achieve Goals: Good Progress towards PT goals: Progressing toward goals    Frequency  Min  2X/week    PT Plan Current plan remains appropriate    Co-evaluation             End of Session Equipment Utilized During Treatment: Gait belt;Oxygen Activity Tolerance: Patient limited by fatigue Patient left: in chair;with call bell/phone within reach;with chair alarm set     Time: 6803-2122 PT Time Calculation (min) (ACUTE ONLY): 23 min  Charges:  $Gait Training: 8-22 mins $Therapeutic Activity: 8-22 mins                    G CodesLeitha Bleak 2015/05/25, 11:50 AM [name], [place]

## 2015-05-17 NOTE — Care Management (Addendum)
Son wants to take his mother home tomorrow with [facility] services in place. Theresa Vasquez lives at home with her ex-husband and 24/7 care givers per son. Face -to-face, face sheet, home health and therapy orders faxed to [facility]. Discharge tomorrow per Dr. Tressia Miners. Shelbie Ammons RN MSN CCM Care Management [phone]

## 2015-05-17 NOTE — Plan of Care (Signed)
Problem: Discharge Progression Outcomes Goal: Other Discharge Outcomes/Goals Outcome: Progressing Plan of care progress to goal: Pt mood is much better that previous night shift.  [place] given once.  Pt not as tearful or agitation.  Continue to reorient pt. VSS, afebrile Pt remains in bed during evening shift, but am shift got pt to bathroom with 1 person assist.

## 2015-05-17 NOTE — Plan of Care (Signed)
Problem: Discharge Progression Outcomes Goal: Other Discharge Outcomes/Goals Outcome: Progressing VSS. O2 titrated to 1L o2 per [place] with O2 sats 94%. Denies pain. Hgb 8.9. No signs of bleeding. No stool dujring the shift. Tolerates diet. Denies n/v. Foley removed, voids without difficulty. Walked with a walker with PT tolerated well. Plan to be discharge home tomorrow with home health.

## 2015-05-17 NOTE — NC FL2 (Signed)
[place] LEVEL OF CARE SCREENING TOOL     IDENTIFICATION  Patient Name: Theresa Vasquez Birthdate: [birth year] Sex: female Admission Date (Current Location): 05/14/2015  [place] and [place] Number: Engineering geologist and Address:  [facility], [address] [place], [place] [address]      Provider Number: [id number]  Attending Physician Name and Address:  Gladstone Lighter, MD  Relative Name and Phone Number:       Current Level of Care: Hospital Recommended Level of Care: [place] Prior Approval Number:    Date Approved/Denied:   PASRR Number:    Discharge Plan: SNF    Current Diagnoses: Patient Active Problem List   Diagnosis Date Noted  . Acute GI bleeding   . Severe anemia   . PVD (peripheral vascular disease) ([place]) 05/14/2015  . 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 COPD, severe ([place]) 05/14/2015  . History of pneumonia 05/14/2015  . Hypoxia 05/14/2015  . GI bleed 05/14/2015  . Benign hypertensive renal disease 05/14/2015  . Vascular dementia 05/14/2015  . Anxiety disorder 05/14/2015  . Edema 05/14/2015  . Diabetic polyneuropathy ([place]) 05/14/2015  . Diverticulosis 05/14/2015  . Abnormality of gait 05/14/2015  . Osteoporosis 05/14/2015  . Thrombocytosis ([place]) 05/14/2015  . Hypothyroidism 05/14/2015  . Barrett's esophagus 05/14/2015  . Drug addiction in remission ([place]) 05/14/2015  . Mitral regurgitation 05/14/2015  . LBBB (left bundle branch block) 05/14/2015  . Oxygen dependent 05/14/2015  . Carotid bruit 05/14/2015  . Ischemic colitis ([place]) 05/14/2015  . Nondisplaced fracture of fourth metatarsal bone of right foot 05/14/2015  . History of peptic ulcer disease 05/14/2015  . Anemia 05/14/2015  . CHF (congestive heart failure) ([place]) 05/14/2015  . Atherosclerosis of aorta ([place]) 05/14/2015  . Iron deficiency anemia 11/24/2014    Orientation ACTIVITIES/SOCIAL BLADDER  RESPIRATION    Self, Time, Situation, Place  Family supportive Indwelling catheter O2 (As needed), Other (Comment) (3L)  BEHAVIORAL SYMPTOMS/MOOD NEUROLOGICAL BOWEL NUTRITION STATUS      Continent Diet (soft)  PHYSICIAN VISITS COMMUNICATION OF NEEDS Height & Weight Skin    Verbally 4\' 11"  (149.9 cm) 195 lbs. Normal          AMBULATORY STATUS RESPIRATION    Supervision limited O2 (As needed), Other (Comment) (3L)      Personal Care Assistance Level of Assistance  Bathing Bathing Assistance: Limited assistance   Dressing Assistance: Limited assistance      Functional Limitations Info  Hearing   Hearing Info: Impaired         SPECIAL CARE FACTORS FREQUENCY  PT (By licensed PT)     PT Frequency: 5             Additional Factors Info                  Current Medications (05/17/2015): Current Facility-Administered Medications  Medication Dose Route Frequency Provider Last Rate Last Dose  . acetaminophen (TYLENOL) tablet 650 mg  650 mg Oral Q6H PRN Gladstone Lighter, MD      . albuterol (PROVENTIL) (2.5 MG/3ML) 0.083% nebulizer solution 2.5 mg  2.5 mg Nebulization Q4H PRN Gladstone Lighter, MD   2.5 mg at 05/16/15 0400  . amiodarone (PACERONE) tablet 200 mg  200 mg Oral Daily Gladstone Lighter, MD   200 mg at 05/17/15 3235  . divalproex (DEPAKOTE SPRINKLE) capsule 250 mg  250 mg Oral TID Gladstone Lighter, MD   250 mg  at 05/17/15 0839  . donepezil (ARICEPT) tablet 10 mg  10 mg Oral QHS Gladstone Lighter, MD   10 mg at 05/16/15 2004  . enalapril (VASOTEC) tablet 5 mg  5 mg Oral BID Gladstone Lighter, MD   5 mg at 05/17/15 0839  . famotidine (PEPCID) tablet 20 mg  20 mg Oral BID Gladstone Lighter, MD   20 mg at 05/17/15 0839  . ferrous sulfate tablet 325 mg  325 mg Oral Daily Gladstone Lighter, MD   325 mg at 05/17/15 0837  . furosemide (LASIX) tablet 40 mg  40 mg Oral BID Gladstone Lighter, MD   40 mg at 05/17/15 1914  . gabapentin (NEURONTIN) capsule 400 mg  400  mg Oral BID Gladstone Lighter, MD   400 mg at 05/17/15 0839  . insulin aspart (novoLOG) injection 0-9 Units  0-9 Units Subcutaneous TID WC Max Sane, MD   1 Units at 05/17/15 1210  . levofloxacin (LEVAQUIN) tablet 500 mg  500 mg Oral Daily Gladstone Lighter, MD   500 mg at 05/17/15 1210  . levothyroxine (SYNTHROID, LEVOTHROID) tablet 150 mcg  150 mcg Oral QAC breakfast Gladstone Lighter, MD   150 mcg at 05/17/15 7829  . LORazepam (ATIVAN) tablet 0.5 mg  0.5 mg Oral QHS Gladstone Lighter, MD   0.5 mg at 05/16/15 2002  . LORazepam (ATIVAN) tablet 0.5 mg  0.5 mg Oral Q6H PRN Gladstone Lighter, MD   0.5 mg at 05/16/15 0806  . metFORMIN (GLUCOPHAGE) tablet 250 mg  250 mg Oral Q breakfast Gladstone Lighter, MD   250 mg at 05/17/15 5621  . metoprolol tartrate (LOPRESSOR) tablet 25 mg  25 mg Oral BID Gladstone Lighter, MD   25 mg at 05/17/15 0839  . mometasone-formoterol (DULERA) 100-5 MCG/ACT inhaler 2 puff  2 puff Inhalation BID Gladstone Lighter, MD   2 puff at 05/17/15 0841  . pantoprazole (PROTONIX) EC tablet 40 mg  40 mg Oral BID Gladstone Lighter, MD   40 mg at 05/17/15 3086  . potassium chloride SA (K-DUR,KLOR-CON) CR tablet 20 mEq  20 mEq Oral BID Gladstone Lighter, MD   20 mEq at 05/17/15 5784  . sodium chloride 0.9 % injection 3 mL  3 mL Intravenous Q12H Gladstone Lighter, MD   3 mL at 05/16/15 2232  . tiotropium (SPIRIVA) inhalation capsule 18 mcg  18 mcg Inhalation Daily Gladstone Lighter, MD   18 mcg at 05/17/15 0841  . traMADol (ULTRAM) tablet 50 mg  50 mg Oral Q6H PRN Gladstone Lighter, MD      . venlafaxine XR (EFFEXOR-XR) 24 hr capsule 37.5 mg  37.5 mg Oral Q breakfast Gladstone Lighter, MD   37.5 mg at 05/17/15 6962   Do not use this list as official medication orders. Please verify with discharge summary.  Discharge Medications:   Medication List    ASK your doctor about these medications        albuterol (2.5 MG/3ML) 0.083% nebulizer solution  Commonly known as:  PROVENTIL   Take 2.5 mg by nebulization every 4 (four) hours as needed for wheezing or shortness of breath.     amiodarone 200 MG tablet  Commonly known as:  PACERONE  Take 200 mg by mouth daily.     apixaban 5 MG Tabs tablet  Commonly known as:  ELIQUIS  Take 5 mg by mouth 2 (two) times daily.     divalproex 125 MG capsule  Commonly known as:  DEPAKOTE SPRINKLE  Take 250 mg by mouth 3 (  three) times daily.     donepezil 10 MG tablet  Commonly known as:  ARICEPT  Take 1 tablet (10 mg total) by mouth at bedtime.     enalapril 2.5 MG tablet  Commonly known as:  VASOTEC  Take 5 mg by mouth 2 (two) times daily.     famotidine 20 MG tablet  Commonly known as:  PEPCID  Take 20 mg by mouth 2 (two) times daily.     ferrous sulfate 325 (65 FE) MG tablet  Take 325 mg by mouth daily.     Fluticasone-Salmeterol 250-50 MCG/DOSE Aepb  Commonly known as:  ADVAIR  Inhale 1 puff into the lungs 2 (two) times daily.     furosemide 40 MG tablet  Commonly known as:  LASIX  Take 40 mg by mouth 2 (two) times daily.     gabapentin 400 MG capsule  Commonly known as:  NEURONTIN  Take 1 capsule (400 mg total) by mouth 2 (two) times daily.     levothyroxine 150 MCG tablet  Commonly known as:  SYNTHROID, LEVOTHROID  Take 1 tablet (150 mcg total) by mouth daily before breakfast.     LORazepam 0.5 MG tablet  Commonly known as:  ATIVAN  Take 1 tablet (0.5 mg total) by mouth daily.     Melatonin 3 MG Tabs  Take 3 mg by mouth at bedtime.     metFORMIN 500 MG tablet  Commonly known as:  GLUCOPHAGE  Take 0.5 tablets (250 mg total) by mouth daily with breakfast.     metoprolol 50 MG tablet  Commonly known as:  LOPRESSOR  Take 25 mg by mouth 2 (two) times daily.     pantoprazole 40 MG tablet  Commonly known as:  PROTONIX  Take 1 tablet (40 mg total) by mouth 2 (two) times daily.     potassium chloride SA 20 MEQ tablet  Commonly known as:  K-DUR,KLOR-CON  Take 20 mEq by mouth 2 (two) times daily.      tiotropium 18 MCG inhalation capsule  Commonly known as:  SPIRIVA  Place 18 mcg into inhaler and inhale daily.     traMADol 50 MG tablet  Commonly known as:  ULTRAM  Take 50 mg by mouth every 6 (six) hours as needed for moderate pain.     venlafaxine XR 37.5 MG 24 hr capsule  Commonly known as:  EFFEXOR XR  Take 1 capsule (37.5 mg total) by mouth daily with breakfast.        Relevant Imaging Results:  Relevant Lab Results:  Recent Labs    Additional Regino Ramirez, LCSW

## 2015-05-17 NOTE — Discharge Summary (Signed)
[facility] [place] NAME: Quiera Diffee    MR#:  [id number]  DATE OF BIRTH:  03-22-1931  DATE OF ADMISSION:  05/14/2015 ADMITTING PHYSICIAN: Gladstone Lighter, MD  DATE OF DISCHARGE: 05/18/2015  PRIMARY CARE PHYSICIAN: Park Liter, DO    ADMISSION DIAGNOSIS:  CHF (congestive heart failure) (HCC) [I50.9] Acute GI bleeding [K92.2] Severe anemia [D64.9]  DISCHARGE DIAGNOSIS:  Active Problems:   Anemia   Acute GI bleeding   Severe anemia   SECONDARY DIAGNOSIS:   Past Medical History  Diagnosis Date  . Anemia   . Diabetes mellitus without complication ([place])   . Depression   . Hypertension   . Colitis   . Diverticulitis   . Hypothyroidism   . Hyperlipidemia   . PVD (peripheral vascular disease) ([place]) 05/14/2015  . CHF (congestive heart failure) ([place])   . Dementia   . Paroxysmal atrial fibrillation (HCC)     s/p cardioversion  . Iron deficiency anemia     HOSPITAL COURSE:   Charlotte Fidalgo is a 81 y.o. female with a known history of diabetes, hypertension, congestive heart failure, dementia and paroxysmal atrial fibrillation status post cardioversion on eliquis comes to the hospital from PCP office secondary to low hemoglobin.  #1 Anemia- acute on chronic anemia - likely GI source from being on eliquis - hold Eliquis, she probably is not a good candidate for anticoagulation at all. Discussed with son. -Received total units of 3 units transfusion at this admission. Hemoglobin is down again to 8.9. Recheck tomorrow a.m.  - Change Protonix to oral twice a day. Advance her diet. 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  -EGD canceled due to patient's respiratory distress episode. -If hemoglobin is stable, likely can be done as an outpatient. -Appreciate GI consult  #2 acute hypoxic respiratory failure-secondary to pulmonary edema -Due to blood transfusion, as patient had history of  transfusion reactions in the past. - currently on 3 L oxygen. -Improved after an extra dose of Lasix. -Continue her oral Lasix  - wean off oxygen as tolerated.   #3 Chronic diastolic congestive heart failure-with acute pulm edema from blood Tx on admission - Improved with an extra dose of lasix - echocardiogram showed normal LV function.  - cont oral lasix bid. - Wean oxygen as tolerated.  #4 dementia and depression-seems to be at baseline. -Typical history of sundowning. Also gets emotional and cries a lot when she is upset. -Continue Effexor, Ativan, Aricept. Ativan PRN ordered -Also on Depakote for mood stability  #5 atrial fibrillation-paroxysmal, status post cardioversion. Currently in sinus rhythm. -Continue amiodarone and also metoprolol -Discussed with son about holding eliquis at the time of discharge as well and recommended follow-up with outpatient cardiologist.  #6 Pneumonia-likely pneumonia as noted on chest x-ray. -Improving, started on Levaquin 05/16/15- cont for 7 days total. -No fevers, urine was normal on admission  Physical Therapy recommended rehab.  DISCHARGE CONDITIONS:   Guarded  CONSULTS OBTAINED:  Treatment Team:  Lucilla Lame, MD  DRUG ALLERGIES:  No Known Allergies  DISCHARGE MEDICATIONS:   Current Discharge Medication List    START taking these medications   Details  levofloxacin (LEVAQUIN) 500 MG tablet Take 1 tablet (500 mg total) by mouth daily. Qty: 5 tablet, Refills: 0      CONTINUE these medications which have CHANGED   Details  !! LORazepam (ATIVAN) 0.5 MG tablet Take 1 tablet (0.5 mg total) by mouth at bedtime. Qty:  20 tablet, Refills: 0    !! LORazepam (ATIVAN) 0.5 MG tablet Take 1 tablet (0.5 mg total) by mouth every 8 (eight) hours as needed for anxiety. Qty: 15 tablet, Refills: 0     !! - Potential duplicate medications found. Please discuss with provider.    CONTINUE these medications which have NOT CHANGED   Details   albuterol (PROVENTIL) (2.5 MG/3ML) 0.083% nebulizer solution Take 2.5 mg by nebulization every 4 (four) hours as needed for wheezing or shortness of breath.    amiodarone (PACERONE) 200 MG tablet Take 200 mg by mouth daily.     divalproex (DEPAKOTE SPRINKLE) 125 MG capsule Take 250 mg by mouth 3 (three) times daily.     donepezil (ARICEPT) 10 MG tablet Take 1 tablet (10 mg total) by mouth at bedtime. Qty: 30 tablet, Refills: 3    enalapril (VASOTEC) 2.5 MG tablet Take 5 mg by mouth 2 (two) times daily.     famotidine (PEPCID) 20 MG tablet Take 20 mg by mouth 2 (two) times daily.    ferrous sulfate 325 (65 FE) MG tablet Take 325 mg by mouth daily.     Fluticasone-Salmeterol (ADVAIR) 250-50 MCG/DOSE AEPB Inhale 1 puff into the lungs 2 (two) times daily.    furosemide (LASIX) 40 MG tablet Take 40 mg by mouth 2 (two) times daily.    gabapentin (NEURONTIN) 400 MG capsule Take 1 capsule (400 mg total) by mouth 2 (two) times daily. Qty: 60 capsule, Refills: 3    levothyroxine (SYNTHROID, LEVOTHROID) 150 MCG tablet Take 1 tablet (150 mcg total) by mouth daily before breakfast. Qty: 90 tablet, Refills: 3    Melatonin 3 MG TABS Take 3 mg by mouth at bedtime.     metFORMIN (GLUCOPHAGE) 500 MG tablet Take 0.5 tablets (250 mg total) by mouth daily with breakfast. Qty: 45 tablet, Refills: 1    metoprolol (LOPRESSOR) 50 MG tablet Take 25 mg by mouth 2 (two) times daily.     pantoprazole (PROTONIX) 40 MG tablet Take 1 tablet (40 mg total) by mouth 2 (two) times daily. Qty: 60 tablet, Refills: 3    potassium chloride SA (K-DUR,KLOR-CON) 20 MEQ tablet Take 20 mEq by mouth 2 (two) times daily.    tiotropium (SPIRIVA) 18 MCG inhalation capsule Place 18 mcg into inhaler and inhale daily.    traMADol (ULTRAM) 50 MG tablet Take 50 mg by mouth every 6 (six) hours as needed for moderate pain.     venlafaxine XR (EFFEXOR XR) 37.5 MG 24 hr capsule Take 1 capsule (37.5 mg total) by mouth daily with  breakfast. Qty: 30 capsule, Refills: 6      STOP taking these medications     apixaban (ELIQUIS) 5 MG TABS tablet          DISCHARGE INSTRUCTIONS:   1. PCP f/u in 1-2 weeks  If you experience worsening of your admission symptoms, develop shortness of breath, life threatening emergency, suicidal or homicidal thoughts you must seek medical attention immediately by calling 911 or calling your MD immediately  if symptoms less severe.  You Must read complete instructions/literature along with all the possible adverse reactions/side effects for all the Medicines you take and that have been prescribed to you. Take any new Medicines after you have completely understood and accept all the possible adverse reactions/side effects.   Please note  You were cared for by a hospitalist during your hospital stay. If you have any questions about your discharge medications or the care  you received while you were in the hospital after you are discharged, you can call the unit and asked to speak with the hospitalist on call if the hospitalist that took care of you is not available. Once you are discharged, your primary care physician will handle any further medical issues. Please note that NO REFILLS for any discharge medications will be authorized once you are discharged, as it is imperative that you return to your primary care physician (or establish a relationship with a primary care physician if you do not have one) for your aftercare needs so that they can reassess your need for medications and monitor your lab values.    Today   CHIEF COMPLAINT:   Chief Complaint  Patient presents with  . Weakness    VITAL SIGNS:  Blood pressure 144/52, pulse 70, temperature 98.9 F (37.2 C), temperature source Oral, resp. rate 20, height 5\' 3"  (1.6 m), weight 88.587 kg (195 lb 4.8 oz), SpO2 99 %.  I/O:   Intake/Output Summary (Last 24 hours) at 05/17/15 1538 Last data filed at 05/17/15 1200  Gross per 24  hour  Intake    600 ml  Output   1300 ml  Net   -700 ml    PHYSICAL EXAMINATION:   Physical Exam  GENERAL: 81 y.o.-year-old elderly patient lying in the bed with no acute distress.  EYES: Pupils equal, round, reactive to light and accommodation. No scleral icterus. Extraocular muscles intact. Wearing glasses Pallor conjunctiva HEENT: Head atraumatic, normocephalic. Oropharynx and nasopharynx clear.  NECK: Supple, no jugular venous distention. No thyroid enlargement, no tenderness.  LUNGS: Normal breath sounds bilaterally, no wheezing, rales,rhonchi or crepitation. No use of accessory muscles of respiration. Decreased bibasilar breath sounds CARDIOVASCULAR: S1, S2 normal. No rubs, or gallops. 3/6 systolic murmur present ABDOMEN: Soft, nontender, nondistended. Bowel sounds present. No organomegaly or mass.  EXTREMITIES: No pedal edema, cyanosis, or clubbing.  NEUROLOGIC: Cranial nerves II through XII are intact. Muscle strength 5/5 in all extremities. Sensation intact. Gait not checked.  PSYCHIATRIC: The patient is alert and oriented x 1-2.  SKIN: No obvious rash, lesion, or ulcer.   DATA REVIEW:   CBC  Recent Labs Lab 05/17/15 0542  WBC 7.3  HGB 8.9*  HCT 28.6*  PLT 344    Chemistries   Recent Labs Lab 05/14/15 1526  05/17/15 0542  NA 140  < > 140  K 3.8  < > 3.6  CL 102  < > 98*  CO2 27  < > 32  GLUCOSE 153*  < > 212*  BUN 17  < > 20  CREATININE 0.94  < > 0.86  CALCIUM 7.9*  < > 8.1*  AST 19  --   --   ALT 11*  --   --   ALKPHOS 47  --   --   BILITOT <0.1*  --   --   < > = values in this interval not displayed.  Cardiac Enzymes No results for input(s): TROPONINI in the last 168 hours.  Microbiology Results  Results for orders placed or performed during the hospital encounter of 10/10/14  Culture, blood (single)     Status: None   Collection Time: 10/10/14  3:15 PM  Result Value Ref Range Status   Micro Text Report   Final       COMMENT                    NO GROWTH AEROBICALLY/ANAEROBICALLY IN 5 DAYS  ANTIBIOTIC                                                      Culture, blood (single)     Status: None   Collection Time: 10/10/14  3:27 PM  Result Value Ref Range Status   Micro Text Report   Final       COMMENT                   NO GROWTH AEROBICALLY/ANAEROBICALLY IN 5 DAYS   ANTIBIOTIC                                                      Culture, blood (single)     Status: None   Collection Time: 10/10/14  3:27 PM  Result Value Ref Range Status   Micro Text Report   Final       COMMENT                   NO GROWTH AEROBICALLY/ANAEROBICALLY IN 5 DAYS   ANTIBIOTIC                                                      Influenza A&B Antigens [facility])     Status: None   Collection Time: 10/10/14  7:42 PM  Result Value Ref Range Status   Micro Text Report   Final       COMMENT                   NEGATIVE FOR INFLUENZA A (ANTIGEN ABSENT)   COMMENT                   NEGATIVE FOR INFLUENZA B (ANTIGEN ABSENT)   ANTIBIOTIC                                                        RADIOLOGY:  Dg Chest 2 View  05/17/2015  CLINICAL DATA:  Follow-up congestive heart failure EXAM: CHEST  2 VIEW COMPARISON:  05/16/2015 FINDINGS: Cardiomegaly again noted. Atherosclerotic calcifications of thoracic aorta. Residual mild interstitial prominence with improvement in aeration. No convincing pulmonary edema. Small left pleural effusion with left basilar atelectasis or infiltrate IMPRESSION: Residual mild interstitial prominence with improvement in aeration. No convincing pulmonary edema. Small left pleural effusion with left basilar atelectasis or infiltrate. Electronically Signed   By: Lahoma Crocker M.D.   On: 05/17/2015 08:03   Dg Chest Port 1 View  05/16/2015  CLINICAL DATA:  Acute onset of shortness of breath. Initial encounter. EXAM: PORTABLE CHEST 1 VIEW COMPARISON:  Chest radiograph performed 05/14/2015 FINDINGS: The lungs are  well-aerated. Diffuse right-sided airspace opacity is noted. Vascular congestion is noted, with more mild left basilar airspace opacity. Small bilateral pleural effusions are suspected. No pneumothorax is seen. This may reflect asymmetric pulmonary  edema or pneumonia. The cardiomediastinal silhouette is borderline normal in size. No acute osseous abnormalities are seen. IMPRESSION: Diffuse right-sided airspace opacity noted. Vascular congestion noted, with more mild left basilar airspace opacity. Small bilateral pleural effusions suspected. This may reflect asymmetric pulmonary edema or pneumonia. Electronically Signed   By: Garald Balding M.D.   On: 05/16/2015 04:42    EKG:   Orders placed or performed during the hospital encounter of 05/14/15  . ED EKG  . ED EKG  . EKG 12-Lead  . EKG 12-Lead      Management plans discussed with the patient, family and they are in agreement.  CODE STATUS:     Code Status Orders        Start     Ordered   05/14/15 1938  Full code   Continuous     05/14/15 1937    Advance Directive Documentation        Most Recent Value   Type of Advance Directive  Healthcare Power of Attorney, Living will   Pre-existing out of facility DNR order (yellow form or pink MOST form)     "MOST" Form in Place?        TOTAL TIME TAKING CARE OF THIS PATIENT: 38 minutes.    Zhane Donlan M.D on 05/17/2015 at 3:38 PM  Between 7am to 6pm - Pager - [phone]  After 6pm go to [email or web address] - password [email or web address] [place] [facility]  Office  [phone]  CC: Primary care physician; Park Liter, DO

## 2015-05-17 NOTE — Progress Notes (Signed)
[facility] [place] NAME: Theresa Vasquez    MR#:  [id number]  DATE OF BIRTH:  Mar 25, 1931  SUBJECTIVE:  CHIEF COMPLAINT:   Chief Complaint  Patient presents with  . Weakness   -Doing much better this morning. Very calm but confused today. -Remains on 3 L oxygen. Hemoglobin dropped again to 8.9. No active bleeding or dark stools noted at this point.  REVIEW OF SYSTEMS:  Review of Systems  Constitutional: Negative for fever and chills.  Respiratory: Negative for cough, shortness of breath and wheezing.   Cardiovascular: Negative for chest pain and palpitations.  Gastrointestinal: Negative for nausea, vomiting, abdominal pain, diarrhea and constipation.  Genitourinary: Negative for dysuria.  Neurological: Negative for dizziness, seizures and headaches.       Confused  Psychiatric/Behavioral: The patient is nervous/anxious.     DRUG ALLERGIES:  No Known Allergies  VITALS:  Blood pressure 138/45, pulse 68, temperature 98.3 F (36.8 C), temperature source Oral, resp. rate 18, height 5\' 3"  (1.6 m), weight 88.587 kg (195 lb 4.8 oz), SpO2 99 %.  PHYSICAL EXAMINATION:  Physical Exam  GENERAL: 81 y.o.-year-old elderly patient lying in the bed with no acute distress.  EYES: Pupils equal, round, reactive to light and accommodation. No scleral icterus. Extraocular muscles intact. Wearing glasses Pallor conjunctiva HEENT: Head atraumatic, normocephalic. Oropharynx and nasopharynx clear.  NECK: Supple, no jugular venous distention. No thyroid enlargement, no tenderness.  LUNGS: Normal breath sounds bilaterally, no wheezing, rales,rhonchi or crepitation. No use of accessory muscles of respiration. Decreased bibasilar breath sounds CARDIOVASCULAR: S1, S2 normal. No rubs, or gallops. 3/6 systolic murmur present ABDOMEN: Soft, nontender, nondistended. Bowel sounds present. No organomegaly or mass.  EXTREMITIES: No pedal edema,  cyanosis, or clubbing.  NEUROLOGIC: Cranial nerves II through XII are intact. Muscle strength 5/5 in all extremities. Sensation intact. Gait not checked.  PSYCHIATRIC: The patient is alert and oriented x 1-2.  SKIN: No obvious rash, lesion, or ulcer.   LABORATORY PANEL:   CBC  Recent Labs Lab 05/17/15 0542  WBC 7.3  HGB 8.9*  HCT 28.6*  PLT 344   ------------------------------------------------------------------------------------------------------------------  Chemistries   Recent Labs Lab 05/14/15 1526  05/17/15 0542  NA 140  < > 140  K 3.8  < > 3.6  CL 102  < > 98*  CO2 27  < > 32  GLUCOSE 153*  < > 212*  BUN 17  < > 20  CREATININE 0.94  < > 0.86  CALCIUM 7.9*  < > 8.1*  AST 19  --   --   ALT 11*  --   --   ALKPHOS 47  --   --   BILITOT <0.1*  --   --   < > = values in this interval not displayed. ------------------------------------------------------------------------------------------------------------------  Cardiac Enzymes No results for input(s): TROPONINI in the last 168 hours. ------------------------------------------------------------------------------------------------------------------  RADIOLOGY:  Dg Chest 2 View  05/17/2015  CLINICAL DATA:  Follow-up congestive heart failure EXAM: CHEST  2 VIEW COMPARISON:  05/16/2015 FINDINGS: Cardiomegaly again noted. Atherosclerotic calcifications of thoracic aorta. Residual mild interstitial prominence with improvement in aeration. No convincing pulmonary edema. Small left pleural effusion with left basilar atelectasis or infiltrate IMPRESSION: Residual mild interstitial prominence with improvement in aeration. No convincing pulmonary edema. Small left pleural effusion with left basilar atelectasis or infiltrate. Electronically Signed   By: Lahoma Crocker M.D.   On: 05/17/2015 08:03   Dg Chest [facility] [address]  05/16/2015  CLINICAL DATA:  Acute onset of shortness of breath. Initial encounter. EXAM: PORTABLE CHEST 1 VIEW  COMPARISON:  Chest radiograph performed 05/14/2015 FINDINGS: The lungs are well-aerated. Diffuse right-sided airspace opacity is noted. Vascular congestion is noted, with more mild left basilar airspace opacity. Small bilateral pleural effusions are suspected. No pneumothorax is seen. This may reflect asymmetric pulmonary edema or pneumonia. The cardiomediastinal silhouette is borderline normal in size. No acute osseous abnormalities are seen. IMPRESSION: Diffuse right-sided airspace opacity noted. Vascular congestion noted, with more mild left basilar airspace opacity. Small bilateral pleural effusions suspected. This may reflect asymmetric pulmonary edema or pneumonia. Electronically Signed   By: Garald Balding M.D.   On: 05/16/2015 04:42    EKG:   Orders placed or performed during the hospital encounter of 05/14/15  . ED EKG  . ED EKG  . EKG 12-Lead  . EKG 12-Lead    ASSESSMENT AND PLAN:   Meg Niemeier is a 81 y.o. female with a known history of diabetes, hypertension, congestive heart failure, dementia and paroxysmal atrial fibrillation status post cardioversion on eliquis comes to the hospital from PCP office secondary to low hemoglobin.  #1 Anemia- acute on chronic anemia - likely GI source from being on eliquis - hold Eliquis, she probably is not a good candidate for anticoagulation at all. Discussed with son. -Received total units of 3 units transfusion at this admission. Hemoglobin is down again to 8.9. Recheck tomorrow a.m.  - Change Protonix to oral twice a day. Advance her diet. 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  -EGD canceled due to patient's respiratory distress episode. -If hemoglobin is stable, likely can be done as an outpatient. -Appreciate GI consult  #2 acute hypoxic respiratory failure-secondary to pulmonary edema -Due to blood transfusion, as patient had history of transfusion reactions in the past. - currently on 3 L  oxygen. -Improved after an extra dose of Lasix. -Continue her oral Lasix  - wean off oxygen as tolerated. Follow-up chest x-ray today  #3 Chronic diastolic congestive heart failure-with acute pulm edema from blood Tx on admission - Improved with an extra dose of lasix - echocardiogram showed normal LV function.  - cont oral lasix bid. -Follow up chest x-ray today. Wean oxygen as tolerated.  #4 dementia and depression-seems to be at baseline. -Typical history of sundowning. Also gets emotional and cries a lot when she is upset. -Continue Effexor, Ativan, Aricept. Ativan PRN ordered -Also on Depakote for mood stability  #5 atrial fibrillation-paroxysmal, status post cardioversion. Currently in sinus rhythm. -Continue amiodarone and also metoprolol -Discussed with son about holding eliquis at the time of discharge as well and recommended follow-up with outpatient cardiologist.  #5 DVT prophylaxis-Ted's and SCDs  #6 leukocytosis-likely pneumonia as noted on chest x-ray. -Improving, started on Levaquin yesterday. -No fevers, urine was normal on admission   All the records are reviewed and case discussed with Care Management/Social Workerr. Management plans discussed with the patient, family and they are in agreement.  CODE STATUS: Full Code  TOTAL TIME TAKING CARE OF THIS PATIENT: 36 minutes.   POSSIBLE D/C TOMORROW, DEPENDING ON CLINICAL CONDITION.   Gladstone Lighter M.D on 05/17/2015 at 10:45 AM  Between 7am to 6pm - Pager - [phone]  After 6pm go to [email or web address] - password [email or web address] [place] [facility]  Office  [phone]  CC: Primary care physician; Park Liter, DO

## 2015-05-18 LAB — CBC
HCT: 27.7 % — ABNORMAL LOW (ref 35.0–47.0)
Hemoglobin: 8.7 g/dL — ABNORMAL LOW (ref 12.0–16.0)
MCH: 28.2 pg (ref 26.0–34.0)
MCHC: 31.6 g/dL — AB (ref 32.0–36.0)
MCV: 89.2 fL (ref 80.0–100.0)
PLATELETS: 351 10*3/uL (ref 150–440)
RBC: 3.1 MIL/uL — ABNORMAL LOW (ref 3.80–5.20)
RDW: 14.9 % — AB (ref 11.5–14.5)
WBC: 7.6 10*3/uL (ref 3.6–11.0)

## 2015-05-18 LAB — GLUCOSE, CAPILLARY: GLUCOSE-CAPILLARY: 143 mg/dL — AB (ref 65–99)

## 2015-05-18 MED ORDER — LEVOFLOXACIN 500 MG PO TABS: 500.0000 mg | ORAL_TABLET | Freq: Every day | ORAL | Status: AC

## 2015-05-18 NOTE — Progress Notes (Signed)
Late entry 05/17/15 for 05/16/15  CSW was told that [name] arrived and that he wanted Pt to go to [facility]. Upon meeting with son and reviewing PT notes with him, it was noted that Pt was able to ambulate 120'. [name] feels confident that Pt can return to [facility] care giver with [facility] PT, RN coming out to the home. Pt has been to SNF before at both [facility] and [facility]. Son has good understanding of benefits of each. He has clear understanding of her medical hx and current needs. CSW updated RN CM and MD regarding change in dc plans. Pt likely to dc on Saturday, son will pick her up at DC.   Toma Copier, [place]

## 2015-05-18 NOTE — Care Management Note (Addendum)
Case Management Note  Patient Details  Name: Theresa Vasquez MRN: [id number] Date of Birth: May 22, 1931  Subjective/Objective:     Referral for home health PT and RN called to Sherian Rein at [facility]. Butch Penny will pull discharge info from her EPIC link.               Action/Plan:   Expected Discharge Date:                  Expected Discharge Plan:     In-House Referral:     Discharge planning Services     Post Acute Care Choice:    Choice offered to:     DME Arranged:    DME Agency:     HH Arranged:    [place] Agency:     Status of Service:     Medicare Important Message Given:  Yes-second notification given Date Medicare IM Given:    Medicare IM give by:    Date Additional Medicare IM Given:    Additional Medicare Important Message give by:     If discussed at [place] of Stay Meetings, dates discussed:    Additional Comments:  Craig Ionescu A, RN 05/18/2015, 9:31 AM

## 2015-05-18 NOTE — Plan of Care (Signed)
Problem: Discharge Progression Outcomes Goal: Other Discharge Outcomes/Goals Individualization of Care Pt prefers to be called Theresa Vasquez Hx of anemia, DM, depression, HTN, colitis, diverticulitis, hypothyroidism, hyperlipidemia, PVD, CHF, dementia and a-fib  Discharge:  Plan to discharge to home today with home health care Pain:  Patient without complaints of pain this shift.  Scheduled Tramadol given per orders. Hemodynamically:  Patient afebrile and VSS this shift.  BP 124/48 mmHg  Pulse 62  Temp(Src) 97.9 F (36.6 C) (Oral)  Resp 20  Ht 5\' 3"  (1.6 m)  Wt 195 lb 4.8 oz (88.587 kg)  BMI 34.60 kg/m2  SpO2 96% Activity:  Patient in bed or in chair for majority of shift. Complications:  Patient alternating between tearful and very irritable and agitated.  She knows who she is but does not know where she is or why she is here.

## 2015-05-18 NOTE — Progress Notes (Signed)
Pt and son given d/c instructions r/t activity, diet, follow up care, medications and when to call MD. Prescriptions x 3 given to son, pt and son voiced understanding, pt d/c home via wheelchair escorted by staff and family

## 2015-05-18 NOTE — Clinical Social Work Note (Signed)
Late entry 05/17/15 for 05/16/15  Clinical Social Work Assessment  Patient Details  Name: Theresa Vasquez MRN: [id number] Date of Birth: [birth year]  Date of referral:  05/17/15               Reason for consult:  Facility Placement                Permission sought to share information with:  Case Manager, Customer service manager, Family Supports Permission granted to share information::  Yes, Verbal Permission Granted  Name::     Son/Tony Architectural technologist::  SNFs  Relationship::     Contact Information:     Housing/Transportation Living arrangements for the past 2 months:  [place] of Information:  Patient, Adult Children, Medical Team Patient Interpreter Needed:  None Criminal Activity/Legal Involvement Pertinent to Current Situation/Hospitalization:  No - Comment as needed Significant Relationships:  Adult Children Lives with:  Other (Comment) (Ex husband and 24 hour care giver) Do you feel safe going back to the place where you live?  Yes Need for family participation in patient care:  Yes (Comment)  Care giving concerns:  [name] has moved Pt's ex-husband and Pt back into the same house and has a 24 hour care giver to assist them. [name] reports that it works well. Pt does not express any concerns either.    Social Worker assessment / plan:  CSW met with Pt who reports that she is divorced and has 3 children. 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 Pt is unsure how many years she worked before she retired, but knows it was a lot. CSW discussed SNF with Pt and she is in agreement. However CSW will need to follow up with [name] Nicole Kindred, as he is the primary care giver for Pt due to her dementia. No family in room at time of assessment.  Employment status:  Retired Nurse, adult PT Recommendations:  [place] / Referral to community resources:  [place]  Patient/Family's Response to care: Pt is calm, though there are multiple RN documentation of patient having crying spells during hospitalization.  Patient/Family's Understanding of and Emotional Response to Diagnosis, Current Treatment, and Prognosis:  Pt calm, understands need for 02 and does not resist use or care. Pt is unable to recall much information of tx process in hospital due to dementia. Pt is smiling and engaging with CSW while discussing rehab. CSW will follow up with [name] regarding dc plans.   Emotional Assessment Appearance:  Appears stated age Attitude/Demeanor/Rapport:   (cooperative) Affect (typically observed):  Appropriate Orientation:  Oriented to Self, Oriented to Place, Oriented to  Time, Oriented to Situation Alcohol / Substance use:  Never Used Psych involvement (Current and /or in the community):  No (Comment)  Discharge Needs  Concerns to be addressed:  Discharge Planning Concerns Readmission within the last 30 days:  No Current discharge risk:  Cognitively Impaired, Chronically ill Barriers to Discharge:  Continued Medical Work up   [employer], LCSW 05/18/2015, 9:11 AM

## 2015-05-18 NOTE — Discharge Summary (Signed)
[facility] NAME: Theresa Vasquez    MR#:  [id number]  DATE OF BIRTH:  Nov 26, 1930  DATE OF ADMISSION:  05/14/2015 ADMITTING PHYSICIAN: Theresa Lighter, MD  DATE OF DISCHARGE: 05/18/2015  PRIMARY CARE PHYSICIAN: Theresa Liter, DO    ADMISSION DIAGNOSIS:  CHF (congestive heart failure) (HCC) [I50.9] Acute GI bleeding [K92.2] Severe anemia [D64.9]  DISCHARGE DIAGNOSIS:  Active Problems:   Anemia   Acute GI bleeding   Severe anemia   SECONDARY DIAGNOSIS:   Past Medical History  Diagnosis Date  . Anemia   . Diabetes mellitus without complication ([place])   . Depression   . Hypertension   . Colitis   . Diverticulitis   . Hypothyroidism   . Hyperlipidemia   . PVD (peripheral vascular disease) ([place]) 05/14/2015  . CHF (congestive heart failure) ([place])   . Dementia   . Paroxysmal atrial fibrillation (HCC)     s/p cardioversion  . Iron deficiency anemia     HOSPITAL COURSE:   Theresa Vasquez is a 81 y.o. female with a known history of diabetes, hypertension, congestive heart failure, dementia and paroxysmal atrial fibrillation status post cardioversion on eliquis comes to the hospital from PCP office secondary to low hemoglobin.  #1 Anemia- acute on chronic anemia - likely GI source from being on eliquis - hold Eliquis, she probably is not a good candidate for anticoagulation at all. Discussed with son. -Received total units of 3 units transfusion at this admission. Hemoglobin is down again to 8.9. Recheck tomorrow a.m.  - Change Protonix to oral twice a day. Advance her diet. 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  -EGD canceled due to patient's respiratory distress episode. -If hemoglobin is stable, likely can be done as an outpatient. -Appreciate GI consult  #2 acute hypoxic respiratory failure-secondary to pulmonary edema -Due to blood transfusion, as patient had history of  transfusion reactions in the past. - currently on 3 L oxygen. -Improved after an extra dose of Lasix. -Continue her oral Lasix  - wean off oxygen as tolerated.   #3 Chronic diastolic congestive heart failure-with acute pulm edema from blood Tx on admission - Improved with an extra dose of lasix - echocardiogram showed normal LV function.  - cont oral lasix bid. - Wean oxygen as tolerated.  #4 dementia and depression-seems to be at baseline. -Typical history of sundowning. Also gets emotional and cries a lot when she is upset. -Continue Effexor, Ativan, Aricept. Ativan PRN ordered -Also on Depakote for mood stability  #5 atrial fibrillation-paroxysmal, status post cardioversion. Currently in sinus rhythm. -Continue amiodarone and also metoprolol -Discussed with son about holding eliquis at the time of discharge as well and recommended follow-up with outpatient cardiologist.  #6 Pneumonia-likely pneumonia as noted on chest x-ray. -Improving, started on Levaquin 05/16/15- cont for 7 days total. -No fevers, urine was normal on admission  Physical Therapy recommended rehab. But son wants to take her home with home health.  DISCHARGE CONDITIONS:   guarded  CONSULTS OBTAINED:  Treatment Team:  Theresa Lame, MD  DRUG ALLERGIES:  No Known Allergies  DISCHARGE MEDICATIONS:   Current Discharge Medication List    START taking these medications   Details  levofloxacin (LEVAQUIN) 500 MG tablet Take 1 tablet (500 mg total) by mouth daily. Qty: 5 tablet, Refills: 0      CONTINUE these medications which have CHANGED   Details  !! LORazepam (ATIVAN) 0.5 MG tablet Take  1 tablet (0.5 mg total) by mouth at bedtime. Qty: 20 tablet, Refills: 0    !! LORazepam (ATIVAN) 0.5 MG tablet Take 1 tablet (0.5 mg total) by mouth every 8 (eight) hours as needed for anxiety. Qty: 15 tablet, Refills: 0     !! - Potential duplicate medications found. Please discuss with provider.    CONTINUE  these medications which have NOT CHANGED   Details  albuterol (PROVENTIL) (2.5 MG/3ML) 0.083% nebulizer solution Take 2.5 mg by nebulization every 4 (four) hours as needed for wheezing or shortness of breath.    amiodarone (PACERONE) 200 MG tablet Take 200 mg by mouth daily.     divalproex (DEPAKOTE SPRINKLE) 125 MG capsule Take 250 mg by mouth 3 (three) times daily.     donepezil (ARICEPT) 10 MG tablet Take 1 tablet (10 mg total) by mouth at bedtime. Qty: 30 tablet, Refills: 3    enalapril (VASOTEC) 2.5 MG tablet Take 5 mg by mouth 2 (two) times daily.     famotidine (PEPCID) 20 MG tablet Take 20 mg by mouth 2 (two) times daily.    ferrous sulfate 325 (65 FE) MG tablet Take 325 mg by mouth daily.     Fluticasone-Salmeterol (ADVAIR) 250-50 MCG/DOSE AEPB Inhale 1 puff into the lungs 2 (two) times daily.    furosemide (LASIX) 40 MG tablet Take 40 mg by mouth 2 (two) times daily.    gabapentin (NEURONTIN) 400 MG capsule Take 1 capsule (400 mg total) by mouth 2 (two) times daily. Qty: 60 capsule, Refills: 3    levothyroxine (SYNTHROID, LEVOTHROID) 150 MCG tablet Take 1 tablet (150 mcg total) by mouth daily before breakfast. Qty: 90 tablet, Refills: 3    Melatonin 3 MG TABS Take 3 mg by mouth at bedtime.     metFORMIN (GLUCOPHAGE) 500 MG tablet Take 0.5 tablets (250 mg total) by mouth daily with breakfast. Qty: 45 tablet, Refills: 1    metoprolol (LOPRESSOR) 50 MG tablet Take 25 mg by mouth 2 (two) times daily.     pantoprazole (PROTONIX) 40 MG tablet Take 1 tablet (40 mg total) by mouth 2 (two) times daily. Qty: 60 tablet, Refills: 3    potassium chloride SA (K-DUR,KLOR-CON) 20 MEQ tablet Take 20 mEq by mouth 2 (two) times daily.    tiotropium (SPIRIVA) 18 MCG inhalation capsule Place 18 mcg into inhaler and inhale daily.    traMADol (ULTRAM) 50 MG tablet Take 50 mg by mouth every 6 (six) hours as needed for moderate pain.     venlafaxine XR (EFFEXOR XR) 37.5 MG 24 hr capsule  Take 1 capsule (37.5 mg total) by mouth daily with breakfast. Qty: 30 capsule, Refills: 6      STOP taking these medications     apixaban (ELIQUIS) 5 MG TABS tablet          DISCHARGE INSTRUCTIONS:   1. PCP f/u in 1-2 weeks 2. Please hold the amiodarone for the next 3 days while on levaquin  If you experience worsening of your admission symptoms, develop shortness of breath, life threatening emergency, suicidal or homicidal thoughts you must seek medical attention immediately by calling 911 or calling your MD immediately  if symptoms less severe.  You Must read complete instructions/literature along with all the possible adverse reactions/side effects for all the Medicines you take and that have been prescribed to you. Take any new Medicines after you have completely understood and accept all the possible adverse reactions/side effects.   Please note  You were cared for by a hospitalist during your hospital stay. If you have any questions about your discharge medications or the care you received while you were in the hospital after you are discharged, you can call the unit and asked to speak with the hospitalist on call if the hospitalist that took care of you is not available. Once you are discharged, your primary care physician will handle any further medical issues. Please note that NO REFILLS for any discharge medications will be authorized once you are discharged, as it is imperative that you return to your primary care physician (or establish a relationship with a primary care physician if you do not have one) for your aftercare needs so that they can reassess your need for medications and monitor your lab values.    Today   CHIEF COMPLAINT:   Chief Complaint  Patient presents with  . Weakness     VITAL SIGNS:  Blood pressure 131/46, pulse 64, temperature 97.9 F (36.6 C), temperature source Oral, resp. rate 18, height 5\' 3"  (1.6 m), weight 88.587 kg (195 lb 4.8 oz), SpO2  97 %.  I/O:   Intake/Output Summary (Last 24 hours) at 05/18/15 0846 Last data filed at 05/18/15 0545  Gross per 24 hour  Intake    360 ml  Output    450 ml  Net    -90 ml    PHYSICAL EXAMINATION:   Physical Exam  GENERAL: 81 y.o.-year-old elderly patient lying in the bed with no acute distress.  EYES: Pupils equal, round, reactive to light and accommodation. No scleral icterus. Extraocular muscles intact. Wearing glasses Pallor conjunctiva HEENT: Head atraumatic, normocephalic. Oropharynx and nasopharynx clear.  NECK: Supple, no jugular venous distention. No thyroid enlargement, no tenderness.  LUNGS: Normal breath sounds bilaterally, no wheezing, rales,rhonchi or crepitation. No use of accessory muscles of respiration. Decreased bibasilar breath sounds CARDIOVASCULAR: S1, S2 normal. No rubs, or gallops. 3/6 systolic murmur present ABDOMEN: Soft, nontender, nondistended. Bowel sounds present. No organomegaly or mass.  EXTREMITIES: No pedal edema, cyanosis, or clubbing.  NEUROLOGIC: Cranial nerves II through XII are intact. Muscle strength 5/5 in all extremities. Sensation intact. Gait not checked.  PSYCHIATRIC: The patient is alert and oriented x 1-2.  SKIN: No obvious rash, lesion, or ulcer.   DATA REVIEW:   CBC  Recent Labs Lab 05/18/15 0522  WBC 7.6  HGB 8.7*  HCT 27.7*  PLT 351    Chemistries   Recent Labs Lab 05/14/15 1526  05/17/15 0542  NA 140  < > 140  K 3.8  < > 3.6  CL 102  < > 98*  CO2 27  < > 32  GLUCOSE 153*  < > 212*  BUN 17  < > 20  CREATININE 0.94  < > 0.86  CALCIUM 7.9*  < > 8.1*  AST 19  --   --   ALT 11*  --   --   ALKPHOS 47  --   --   BILITOT <0.1*  --   --   < > = values in this interval not displayed.  Cardiac Enzymes No results for input(s): TROPONINI in the last 168 hours.  Microbiology Results  Results for orders placed or performed during the hospital encounter of 10/10/14  Culture, blood (single)     Status:  None   Collection Time: 10/10/14  3:15 PM  Result Value Ref Range Status   Micro Text Report   Final  COMMENT                   NO GROWTH AEROBICALLY/ANAEROBICALLY IN 5 DAYS   ANTIBIOTIC                                                      Culture, blood (single)     Status: None   Collection Time: 10/10/14  3:27 PM  Result Value Ref Range Status   Micro Text Report   Final       COMMENT                   NO GROWTH AEROBICALLY/ANAEROBICALLY IN 5 DAYS   ANTIBIOTIC                                                      Culture, blood (single)     Status: None   Collection Time: 10/10/14  3:27 PM  Result Value Ref Range Status   Micro Text Report   Final       COMMENT                   NO GROWTH AEROBICALLY/ANAEROBICALLY IN 5 DAYS   ANTIBIOTIC                                                      Influenza A&B Antigens [facility])     Status: None   Collection Time: 10/10/14  7:42 PM  Result Value Ref Range Status   Micro Text Report   Final       COMMENT                   NEGATIVE FOR INFLUENZA A (ANTIGEN ABSENT)   COMMENT                   NEGATIVE FOR INFLUENZA B (ANTIGEN ABSENT)   ANTIBIOTIC                                                        RADIOLOGY:  Dg Chest 2 View  05/17/2015  CLINICAL DATA:  Follow-up congestive heart failure EXAM: CHEST  2 VIEW COMPARISON:  05/16/2015 FINDINGS: Cardiomegaly again noted. Atherosclerotic calcifications of thoracic aorta. Residual mild interstitial prominence with improvement in aeration. No convincing pulmonary edema. Small left pleural effusion with left basilar atelectasis or infiltrate IMPRESSION: Residual mild interstitial prominence with improvement in aeration. No convincing pulmonary edema. Small left pleural effusion with left basilar atelectasis or infiltrate. Electronically Signed   By: Lahoma Crocker M.D.   On: 05/17/2015 08:03    EKG:   Orders placed or performed during the hospital encounter of 05/14/15  . ED EKG   . ED EKG  . EKG 12-Lead  . EKG 12-Lead      Management plans discussed with the  patient, family and they are in agreement.  CODE STATUS:     Code Status Orders        Start     Ordered   05/14/15 1938  Full code   Continuous     05/14/15 1937    Advance Directive Documentation        Most Recent Value   Type of Advance Directive  Healthcare Power of Attorney, Living will   Pre-existing out of facility DNR order (yellow form or pink MOST form)     "MOST" Form in Place?        TOTAL TIME TAKING CARE OF THIS PATIENT: 38 minutes.    Theresa Vasquez M.D on 05/18/2015 at 8:46 AM  Between 7am to 6pm - Pager - [phone]  After 6pm go to [email or web address] - password [email or web address] [facility]  Office  [phone]  CC: Primary care physician; Theresa Liter, DO

## 2015-05-18 NOTE — Discharge Instructions (Signed)
Please hold the amiodarone for the next 3 days while on levaquin

## 2015-05-20 NOTE — Telephone Encounter (Signed)
Pt scheduled for fu 05/28/15

## 2015-05-21 ENCOUNTER — Telehealth: Payer: Self-pay | Admitting: Family Medicine

## 2015-05-21 MED ORDER — GABAPENTIN 400 MG PO CAPS: 400.0000 mg | ORAL_CAPSULE | Freq: Two times a day (BID) | ORAL | Status: DC

## 2015-05-21 MED ORDER — ENALAPRIL MALEATE 5 MG PO TABS: 5.0000 mg | ORAL_TABLET | Freq: Two times a day (BID) | ORAL | Status: DC

## 2015-05-21 MED ORDER — DIVALPROEX SODIUM 250 MG PO DR TAB: 250.0000 mg | DELAYED_RELEASE_TABLET | Freq: Three times a day (TID) | ORAL | Status: DC

## 2015-05-21 NOTE — Telephone Encounter (Signed)
Forward to provider

## 2015-05-21 NOTE — Telephone Encounter (Signed)
[name] called stated pt needs refill on her meds to last until her appt next week. Pt needs the depakote, enalopril, and gabapentin. Pharm is [facility] in [place]. Thanks

## 2015-05-21 NOTE — Telephone Encounter (Signed)
Rxs sent to her pharmacy 

## 2015-05-22 ENCOUNTER — Telehealth: Payer: Self-pay | Admitting: Family Medicine

## 2015-05-22 NOTE — Telephone Encounter (Signed)
RN wants to order nursing and physical therapy 2 times a week for 8 weeks

## 2015-05-23 NOTE — Telephone Encounter (Signed)
That's fine

## 2015-05-23 NOTE — Telephone Encounter (Signed)
Order Given

## 2015-05-28 ENCOUNTER — Encounter: Payer: Self-pay | Admitting: Family Medicine

## 2015-05-28 ENCOUNTER — Ambulatory Visit (INDEPENDENT_AMBULATORY_CARE_PROVIDER_SITE_OTHER): Payer: Medicare Other | Admitting: Family Medicine

## 2015-05-28 VITALS — BP 144/71 | HR 73 | Temp 98.5°F | Wt 189.0 lb

## 2015-05-28 DIAGNOSIS — I480 Paroxysmal atrial fibrillation: Secondary | ICD-10-CM

## 2015-05-28 DIAGNOSIS — Z79899 Other long term (current) drug therapy: Secondary | ICD-10-CM

## 2015-05-28 DIAGNOSIS — N182 Chronic kidney disease, stage 2 (mild): Secondary | ICD-10-CM | POA: Diagnosis not present

## 2015-05-28 DIAGNOSIS — Z23 Encounter for immunization: Secondary | ICD-10-CM | POA: Diagnosis not present

## 2015-05-28 DIAGNOSIS — D509 Iron deficiency anemia, unspecified: Secondary | ICD-10-CM | POA: Diagnosis not present

## 2015-05-28 DIAGNOSIS — E1122 Type 2 diabetes mellitus with diabetic chronic kidney disease: Secondary | ICD-10-CM | POA: Diagnosis not present

## 2015-05-28 DIAGNOSIS — I5032 Chronic diastolic (congestive) heart failure: Secondary | ICD-10-CM

## 2015-05-28 DIAGNOSIS — N390 Urinary tract infection, site not specified: Secondary | ICD-10-CM | POA: Diagnosis not present

## 2015-05-28 DIAGNOSIS — K922 Gastrointestinal hemorrhage, unspecified: Secondary | ICD-10-CM

## 2015-05-28 DIAGNOSIS — R8281 Pyuria: Secondary | ICD-10-CM

## 2015-05-28 DIAGNOSIS — I129 Hypertensive chronic kidney disease with stage 1 through stage 4 chronic kidney disease, or unspecified chronic kidney disease: Secondary | ICD-10-CM

## 2015-05-28 DIAGNOSIS — D473 Essential (hemorrhagic) thrombocythemia: Secondary | ICD-10-CM

## 2015-05-28 DIAGNOSIS — D75839 Thrombocytosis, unspecified: Secondary | ICD-10-CM

## 2015-05-28 LAB — MICROALBUMIN, URINE WAIVED
CREATININE, URINE WAIVED: 50 mg/dL (ref 10–300)
Microalb, Ur Waived: 30 mg/L — ABNORMAL HIGH (ref 0–19)

## 2015-05-28 LAB — CBC WITH DIFFERENTIAL/PLATELET
HEMATOCRIT: 35.6 % (ref 34.0–46.6)
Hemoglobin: 10.9 g/dL — ABNORMAL LOW (ref 11.1–15.9)
LYMPHS ABS: 1.3 10*3/uL (ref 0.7–3.1)
Lymphs: 19 %
MCH: 29 pg (ref 26.6–33.0)
MCHC: 30.6 g/dL — ABNORMAL LOW (ref 31.5–35.7)
MCV: 95 fL (ref 79–97)
MID (Absolute): 1.1 10*3/uL (ref 0.1–1.6)
MID: 16 %
Neutrophils Absolute: 4.5 10*3/uL (ref 1.4–7.0)
Neutrophils: 65 %
PLATELETS: 665 10*3/uL — AB (ref 150–379)
RBC: 3.76 x10E6/uL — AB (ref 3.77–5.28)
RDW: 14.6 % (ref 12.3–15.4)
WBC: 6.9 10*3/uL (ref 3.4–10.8)

## 2015-05-28 LAB — MICROSCOPIC EXAMINATION

## 2015-05-28 LAB — BAYER DCA HB A1C WAIVED: HB A1C (BAYER DCA - WAIVED): 5.7 % (ref <7.0)

## 2015-05-28 MED ORDER — PANTOPRAZOLE SODIUM 40 MG PO TBEC: 40.0000 mg | DELAYED_RELEASE_TABLET | Freq: Two times a day (BID) | ORAL | Status: DC

## 2015-05-28 MED ORDER — FLUTICASONE-SALMETEROL 250-50 MCG/DOSE IN AEPB: 1.0000 | INHALATION_SPRAY | Freq: Two times a day (BID) | RESPIRATORY_TRACT | Status: DC

## 2015-05-28 MED ORDER — LORAZEPAM 0.5 MG PO TABS
0.5000 mg | ORAL_TABLET | Freq: Every day | ORAL | Status: DC
Start: 2015-05-28 — End: 2015-07-31

## 2015-05-28 MED ORDER — VENLAFAXINE HCL ER 37.5 MG PO CP24: 37.5000 mg | ORAL_CAPSULE | Freq: Every day | ORAL | Status: DC

## 2015-05-28 MED ORDER — GABAPENTIN 400 MG PO CAPS: 400.0000 mg | ORAL_CAPSULE | Freq: Two times a day (BID) | ORAL | Status: DC

## 2015-05-28 MED ORDER — MELATONIN 3 MG PO TABS: 3.0000 mg | ORAL_TABLET | Freq: Every day | ORAL | Status: AC

## 2015-05-28 MED ORDER — ENALAPRIL MALEATE 5 MG PO TABS: 5.0000 mg | ORAL_TABLET | Freq: Two times a day (BID) | ORAL | Status: DC

## 2015-05-28 MED ORDER — FERROUS SULFATE 325 (65 FE) MG PO TABS: 325.0000 mg | ORAL_TABLET | Freq: Two times a day (BID) | ORAL | Status: DC

## 2015-05-28 MED ORDER — METOPROLOL TARTRATE 50 MG PO TABS: 25.0000 mg | ORAL_TABLET | Freq: Two times a day (BID) | ORAL | Status: DC

## 2015-05-28 MED ORDER — TIOTROPIUM BROMIDE MONOHYDRATE 18 MCG IN CAPS: 18.0000 ug | ORAL_CAPSULE | Freq: Every day | RESPIRATORY_TRACT | Status: DC

## 2015-05-28 MED ORDER — DIVALPROEX SODIUM 250 MG PO DR TAB: 250.0000 mg | DELAYED_RELEASE_TABLET | Freq: Three times a day (TID) | ORAL | Status: DC

## 2015-05-28 MED ORDER — FUROSEMIDE 40 MG PO TABS: 40.0000 mg | ORAL_TABLET | Freq: Two times a day (BID) | ORAL | Status: DC

## 2015-05-28 MED ORDER — DONEPEZIL HCL 10 MG PO TABS: 10.0000 mg | ORAL_TABLET | Freq: Every day | ORAL | Status: DC

## 2015-05-28 NOTE — Assessment & Plan Note (Signed)
Continue current regimen. Continue to monitor. Referral back to Dr. Ubaldo Glassing put in today, as hasn't seen him in over 6 months.

## 2015-05-28 NOTE — Assessment & Plan Note (Signed)
Not able to take eliquis due to bleeding. Needs follow up with GI for EGD. Would like to see Dr. Tiffany Kocher again. Referral generated.

## 2015-05-28 NOTE — Assessment & Plan Note (Signed)
Hgb 10.9 today. Stable off the eliquis. Referral back to Dr. Tiffany Kocher for EGD. Continue iron. To see Dr. Grayland Ormond on 11/29- keep that appointment.

## 2015-05-28 NOTE — Assessment & Plan Note (Signed)
Chronic and stable. Continue to follow with hematology. Continue to monitor.

## 2015-05-28 NOTE — Assessment & Plan Note (Signed)
In sinus today. Cannot be on anticoagulation due to GI bleeds. Needs to follow up with cardiology- referral generated today. Continue to monitor.

## 2015-05-28 NOTE — Assessment & Plan Note (Signed)
A1c 5.7 with weight loss and diet. Will stop metformin and recheck A1c in 3 months.

## 2015-05-28 NOTE — Progress Notes (Signed)
BP 144/71 mmHg  Pulse 73  Temp(Src) 98.5 F (36.9 C)  Wt 189 lb (85.73 kg)  SpO2 93%   Subjective:    Patient ID: Theresa Vasquez, female    DOB: 08/16/1930, 81 y.o.   MRN: [id number]  HPI: Theresa Vasquez is a 81 y.o. female  Chief Complaint  Patient presents with  . Anemia  . Diabetes   Hospital FOLLOW UP Time since discharge: 10 days Hospital/facility: [facility] Diagnosis: acute on chronic anemia, acute hypoxic respiratory failure secondary to pulmonary edema secondary to blood transfunsion, chronic diastolic CHF, dementia, A. Fib, pneumonia on CXR Procedures/tests: protonix BID and transfused 3 units, continue lasix,  Consultants: GI New medications: stopping eliquis and starting levaquin Discharge instructions:  Stop the eliquis. Continue all other medications Status: better- feels tired all the time Needs GI follow up with Dr. Allen Norris with EGD.   HYPERTENSION / HYPERLIPIDEMIA Satisfied with current treatment? yes Duration of hypertension: chronic BP monitoring frequency: not checking BP medication side effects: no Duration of hyperlipidemia: chronic Cholesterol supplements: none Past cholesterol medications: was on a statin previously, stopped due to age and comorbidities Medication compliance: good compliance Aspirin: no Recent stressors: no Recurrent headaches: no Visual changes: no Palpitations: no Dyspnea: no Chest pain: no Lower extremity edema: no Dizzy/lightheaded: yes  DIABETES Hypoglycemic episodes:no Polydipsia/polyuria: no Visual disturbance: no Chest pain: no Paresthesias: no Glucose Monitoring: no Taking Insulin?: no Blood Pressure Monitoring: not checking Retinal Examination: Not up to Date Foot Exam: Up to Date Diabetic Education: Not Completed Pneumovax: Up to Date Influenza: Up to Date Aspirin: no  Relevant past medical, surgical, family and social history reviewed and updated as indicated. Interim medical history since our last visit  reviewed. Allergies and medications reviewed and updated.  Review of Systems  Constitutional: Positive for fatigue. Negative for fever, chills, diaphoresis, activity change, appetite change and unexpected weight change.  HENT: Negative.   Eyes: Negative.   Respiratory: Negative.   Cardiovascular: Negative.   Psychiatric/Behavioral: Negative.     Per HPI unless specifically indicated above     Objective:    BP 144/71 mmHg  Pulse 73  Temp(Src) 98.5 F (36.9 C)  Wt 189 lb (85.73 kg)  SpO2 93%  Wt Readings from Last 3 Encounters:  05/28/15 189 lb (85.73 kg)  05/14/15 195 lb 4.8 oz (88.587 kg)  10/01/14 189 lb 2.5 oz (85.801 kg)    Physical Exam  Constitutional: She is oriented to person, place, and time. She appears well-developed and well-nourished. No distress.  HENT:  Head: Normocephalic and atraumatic.  Right Ear: Hearing normal.  Left Ear: Hearing normal.  Nose: Nose normal.  Eyes: Conjunctivae, EOM and lids are normal. Pupils are equal, round, and reactive to light. Right eye exhibits no discharge. Left eye exhibits no discharge. No scleral icterus.  Neck: Normal range of motion. Neck supple. No JVD present. No tracheal deviation present. No thyromegaly present.  Cardiovascular: Normal rate, regular rhythm and intact distal pulses.  Exam reveals no gallop and no friction rub.   Murmur heard. Pulmonary/Chest: Effort normal and breath sounds normal. No stridor. No respiratory distress. She has no wheezes. She has no rales. She exhibits no tenderness.  Musculoskeletal: Normal range of motion.  Lymphadenopathy:    She has no cervical adenopathy.  Neurological: She is alert and oriented to person, place, and time.  Skin: Skin is warm, dry and intact. No rash noted. No erythema. No pallor.  Psychiatric: She has a normal mood and affect.  Her speech is normal and behavior is normal. Judgment and thought content normal. Cognition and memory are normal.    Results for orders  placed or performed during the hospital encounter of 05/14/15  Comprehensive metabolic panel  Result Value Ref Range   Sodium 140 135 - 145 mmol/L   Potassium 3.8 3.5 - 5.1 mmol/L   Chloride 102 101 - 111 mmol/L   CO2 27 22 - 32 mmol/L   Glucose, Bld 153 (H) 65 - 99 mg/dL   BUN 17 6 - 20 mg/dL   Creatinine, Ser 0.94 0.44 - 1.00 mg/dL   Calcium 7.9 (L) 8.9 - 10.3 mg/dL   Total Protein 6.0 (L) 6.5 - 8.1 g/dL   Albumin 2.9 (L) 3.5 - 5.0 g/dL   AST 19 15 - 41 U/L   ALT 11 (L) 14 - 54 U/L   Alkaline Phosphatase 47 38 - 126 U/L   Total Bilirubin <0.1 (L) 0.3 - 1.2 mg/dL   GFR calc non Af Amer 55 (L) >60 mL/min   GFR calc Af Amer >60 >60 mL/min   Anion gap 11 5 - 15  Lipase, blood  Result Value Ref Range   Lipase 21 11 - 51 U/L  CBC with Differential  Result Value Ref Range   WBC 7.2 3.6 - 11.0 K/uL   RBC 1.80 (L) 3.80 - 5.20 MIL/uL   Hemoglobin 5.0 (L) 12.0 - 16.0 g/dL   HCT 16.5 (L) 35.0 - 47.0 %   MCV 91.5 80.0 - 100.0 fL   MCH 27.6 26.0 - 34.0 pg   MCHC 30.1 (L) 32.0 - 36.0 g/dL   RDW 15.4 (H) 11.5 - 14.5 %   Platelets 400 150 - 440 K/uL   Neutrophils Relative % 65 %   Neutro Abs 4.7 1.4 - 6.5 K/uL   Lymphocytes Relative 17 %   Lymphs Abs 1.2 1.0 - 3.6 K/uL   Monocytes Relative 15 %   Monocytes Absolute 1.1 (H) 0.2 - 0.9 K/uL   Eosinophils Relative 2 %   Eosinophils Absolute 0.2 0 - 0.7 K/uL   Basophils Relative 1 %   Basophils Absolute 0.0 0 - 0.1 K/uL  Protime-INR  Result Value Ref Range   Prothrombin Time 17.2 (H) 11.4 - 15.0 seconds   INR 1.38   APTT  Result Value Ref Range   aPTT 32 24 - 36 seconds  Urinalysis complete, with microscopic  Result Value Ref Range   Color, Urine YELLOW (A) YELLOW   APPearance CLEAR (A) CLEAR   Glucose, UA NEGATIVE NEGATIVE mg/dL   Bilirubin Urine NEGATIVE NEGATIVE   Ketones, ur NEGATIVE NEGATIVE mg/dL   Specific Gravity, Urine 1.011 1.005 - 1.030   Hgb urine dipstick NEGATIVE NEGATIVE   pH 6.0 5.0 - 8.0   Protein, ur  NEGATIVE NEGATIVE mg/dL   Nitrite NEGATIVE NEGATIVE   Leukocytes, UA TRACE (A) NEGATIVE   RBC / HPF NONE SEEN 0 - 5 RBC/hpf   WBC, UA 0-5 0 - 5 WBC/hpf   Bacteria, UA RARE (A) NONE SEEN   Squamous Epithelial / LPF NONE SEEN NONE SEEN   Mucous PRESENT    Hyaline Casts, UA PRESENT   Basic metabolic panel  Result Value Ref Range   Sodium 140 135 - 145 mmol/L   Potassium 4.2 3.5 - 5.1 mmol/L   Chloride 106 101 - 111 mmol/L   CO2 28 22 - 32 mmol/L   Glucose, Bld 205 (H) 65 - 99 mg/dL   BUN  18 6 - 20 mg/dL   Creatinine, Ser 0.76 0.44 - 1.00 mg/dL   Calcium 7.7 (L) 8.9 - 10.3 mg/dL   GFR calc non Af Amer >60 >60 mL/min   GFR calc Af Amer >60 >60 mL/min   Anion gap 6 5 - 15  CBC  Result Value Ref Range   WBC 4.8 3.6 - 11.0 K/uL   RBC 2.29 (L) 3.80 - 5.20 MIL/uL   Hemoglobin 6.5 (L) 12.0 - 16.0 g/dL   HCT 20.4 (L) 35.0 - 47.0 %   MCV 89.1 80.0 - 100.0 fL   MCH 28.4 26.0 - 34.0 pg   MCHC 31.9 (L) 32.0 - 36.0 g/dL   RDW 15.1 (H) 11.5 - 14.5 %   Platelets 377 150 - 440 K/uL  Hemoglobin  Result Value Ref Range   Hemoglobin 6.3 (L) 12.0 - 16.0 g/dL  Hemoglobin  Result Value Ref Range   Hemoglobin 8.9 (L) 12.0 - 16.0 g/dL  CBC  Result Value Ref Range   WBC 16.8 (H) 3.6 - 11.0 K/uL   RBC 3.71 (L) 3.80 - 5.20 MIL/uL   Hemoglobin 10.1 (L) 12.0 - 16.0 g/dL   HCT 33.0 (L) 35.0 - 47.0 %   MCV 88.9 80.0 - 100.0 fL   MCH 27.2 26.0 - 34.0 pg   MCHC 30.6 (L) 32.0 - 36.0 g/dL   RDW 15.0 (H) 11.5 - 14.5 %   Platelets 484 (H) 150 - 440 K/uL  Basic metabolic panel  Result Value Ref Range   Sodium 139 135 - 145 mmol/L   Potassium 4.1 3.5 - 5.1 mmol/L   Chloride 100 (L) 101 - 111 mmol/L   CO2 30 22 - 32 mmol/L   Glucose, Bld 196 (H) 65 - 99 mg/dL   BUN 21 (H) 6 - 20 mg/dL   Creatinine, Ser 0.88 0.44 - 1.00 mg/dL   Calcium 8.2 (L) 8.9 - 10.3 mg/dL   GFR calc non Af Amer 60 (L) >60 mL/min   GFR calc Af Amer >60 >60 mL/min   Anion gap 9 5 - 15  Glucose, capillary  Result Value Ref Range    Glucose-Capillary 101 (H) 65 - 99 mg/dL  Occult blood card to lab, stool RN will collect  Result Value Ref Range   Fecal Occult Bld POSITIVE (A) NEGATIVE  Glucose, capillary  Result Value Ref Range   Glucose-Capillary 175 (H) 65 - 99 mg/dL  Blood gas, arterial  Result Value Ref Range   FIO2 1.00    Delivery systems NON-REBREATHER OXYGEN MASK    pH, Arterial 7.50 (H) 7.350 - 7.450   pCO2 arterial 42 32.0 - 48.0 mmHg   pO2, Arterial 223 (H) 83.0 - 108.0 mmHg   Bicarbonate 32.8 (H) 21.0 - 28.0 mEq/L   Acid-Base Excess 8.7 (H) 0.0 - 3.0 mmol/L   O2 Saturation 99.8 %   Patient temperature 37.0    Collection site RIGHT RADIAL    Sample type ARTERIAL DRAW    Allens test (pass/fail) POSITIVE (A) PASS  Glucose, capillary  Result Value Ref Range   Glucose-Capillary 171 (H) 65 - 99 mg/dL  Glucose, capillary  Result Value Ref Range   Glucose-Capillary 190 (H) 65 - 99 mg/dL  Glucose, capillary  Result Value Ref Range   Glucose-Capillary 109 (H) 65 - 99 mg/dL  CBC  Result Value Ref Range   WBC 7.3 3.6 - 11.0 K/uL   RBC 3.20 (L) 3.80 - 5.20 MIL/uL   Hemoglobin 8.9 (L)  12.0 - 16.0 g/dL   HCT 28.6 (L) 35.0 - 47.0 %   MCV 89.4 80.0 - 100.0 fL   MCH 27.9 26.0 - 34.0 pg   MCHC 31.2 (L) 32.0 - 36.0 g/dL   RDW 15.1 (H) 11.5 - 14.5 %   Platelets 344 150 - 440 K/uL  Basic metabolic panel  Result Value Ref Range   Sodium 140 135 - 145 mmol/L   Potassium 3.6 3.5 - 5.1 mmol/L   Chloride 98 (L) 101 - 111 mmol/L   CO2 32 22 - 32 mmol/L   Glucose, Bld 212 (H) 65 - 99 mg/dL   BUN 20 6 - 20 mg/dL   Creatinine, Ser 0.86 0.44 - 1.00 mg/dL   Calcium 8.1 (L) 8.9 - 10.3 mg/dL   GFR calc non Af Amer >60 >60 mL/min   GFR calc Af Amer >60 >60 mL/min   Anion gap 10 5 - 15  Glucose, capillary  Result Value Ref Range   Glucose-Capillary 175 (H) 65 - 99 mg/dL  Glucose, capillary  Result Value Ref Range   Glucose-Capillary 181 (H) 65 - 99 mg/dL   Comment 1 Notify RN   Glucose, capillary  Result  Value Ref Range   Glucose-Capillary 142 (H) 65 - 99 mg/dL  Glucose, capillary  Result Value Ref Range   Glucose-Capillary 129 (H) 65 - 99 mg/dL  Glucose, capillary  Result Value Ref Range   Glucose-Capillary 162 (H) 65 - 99 mg/dL  CBC  Result Value Ref Range   WBC 7.6 3.6 - 11.0 K/uL   RBC 3.10 (L) 3.80 - 5.20 MIL/uL   Hemoglobin 8.7 (L) 12.0 - 16.0 g/dL   HCT 27.7 (L) 35.0 - 47.0 %   MCV 89.2 80.0 - 100.0 fL   MCH 28.2 26.0 - 34.0 pg   MCHC 31.6 (L) 32.0 - 36.0 g/dL   RDW 14.9 (H) 11.5 - 14.5 %   Platelets 351 150 - 440 K/uL  Glucose, capillary  Result Value Ref Range   Glucose-Capillary 156 (H) 65 - 99 mg/dL   Comment 1 Notify RN   Glucose, capillary  Result Value Ref Range   Glucose-Capillary 143 (H) 65 - 99 mg/dL  Type and screen [facility]  Result Value Ref Range   ABO/RH(D) O POS    Antibody Screen NEG    Sample Expiration 05/17/2015    Unit Number [id number]    Blood Component Type RBC, LR IRR    Unit division 00    Status of Unit ISSUED,FINAL    Transfusion Status OK TO TRANSFUSE    Crossmatch Result Compatible    Unit Number [id number]    Blood Component Type RBC, LR IRR    Unit division 00    Status of Unit ISSUED,FINAL    Transfusion Status OK TO TRANSFUSE    Crossmatch Result Compatible    Unit Number [id number]    Blood Component Type RED CELLS,LR    Unit division 00    Status of Unit ISSUED,FINAL    Transfusion Status OK TO TRANSFUSE    Crossmatch Result Compatible    Unit Number [id number]    Blood Component Type RBC, LR IRR    Unit division 00    Status of Unit REL FROM [facility]    Transfusion Status OK TO TRANSFUSE    Crossmatch Result Compatible    Unit Number [id number]    Blood Component Type RBC, LR IRR    Unit division 00  Status of Unit REL FROM [facility]    Transfusion Status OK TO TRANSFUSE    Crossmatch Result Compatible   Prepare RBC  Result Value Ref Range   Order Confirmation ORDER PROCESSED BY  BLOOD BANK   ABO/Rh  Result Value Ref Range   ABO/RH(D) O POS   Prepare RBC  Result Value Ref Range   Order Confirmation ORDER PROCESSED BY BLOOD BANK   Prepare RBC  Result Value Ref Range   Order Confirmation ORDER PROCESSED BY BLOOD BANK       Assessment & Plan:   Problem List Items Addressed This Visit      Cardiovascular and Mediastinum   Type 2 DM with CKD stage 2 and hypertension ([place]) - Primary    A1c 5.7 with weight loss and diet. Will stop metformin and recheck A1c in 3 months.       Relevant Medications   enalapril (VASOTEC) 5 MG tablet   furosemide (LASIX) 40 MG tablet   metoprolol (LOPRESSOR) 50 MG tablet   Other Relevant Orders   Bayer DCA Hb A1c Waived   UA/M w/rflx Culture, Routine   Microalbumin, Urine Waived   CHF (congestive heart failure) (HCC)    Needs to follow up with cardiology- referral generated today. Continue to monitor.       Relevant Medications   enalapril (VASOTEC) 5 MG tablet   furosemide (LASIX) 40 MG tablet   metoprolol (LOPRESSOR) 50 MG tablet   Other Relevant Orders   Ambulatory referral to Cardiology   Paroxysmal atrial fibrillation (HCC)    In sinus today. Cannot be on anticoagulation due to GI bleeds. Needs to follow up with cardiology- referral generated today. Continue to monitor.       Relevant Medications   enalapril (VASOTEC) 5 MG tablet   furosemide (LASIX) 40 MG tablet   metoprolol (LOPRESSOR) 50 MG tablet   Other Relevant Orders   Ambulatory referral to Cardiology     Digestive   GI bleed    Not able to take eliquis due to bleeding. Needs follow up with GI for EGD. Would like to see Dr. Tiffany Kocher again. Referral generated.       Relevant Orders   Ambulatory referral to Gastroenterology     Genitourinary   Benign hypertensive renal disease    Continue current regimen. Continue to monitor. Referral back to Dr. Ubaldo Glassing put in today, as hasn't seen him in over 6 months.       Relevant Orders   Ambulatory referral to  Cardiology     Hematopoietic and Hemostatic   Thrombocytosis ([place])    Chronic and stable. Continue to follow with hematology. Continue to monitor.         Other   Iron deficiency anemia    Hgb 10.9 today. Stable off the eliquis. Referral back to Dr. Tiffany Kocher for EGD. Continue iron. To see Dr. Grayland Ormond on 11/29- keep that appointment.       Relevant Medications   ferrous sulfate 325 (65 FE) MG tablet   Other Relevant Orders   CBC With Differential/Platelet   Ambulatory referral to Gastroenterology    Other Visit Diagnoses    Long-term use of high-risk medication        Checking depakote levels. Continue to monitor    Relevant Orders    Valproic acid level    Immunization due        Flu shot given today    Relevant Orders    Flu Vaccine QUAD 36+ mos  PF IM (Fluarix & Fluzone Quad PF) (Completed)    Pyuria        Possibly the cause of her fatigue. Will await culture results and treat as needed.         Follow up plan: Return in about 3 months (around 08/28/2015) for Follow up.

## 2015-05-28 NOTE — Assessment & Plan Note (Signed)
Needs to follow up with cardiology- referral generated today. Continue to monitor.

## 2015-05-29 LAB — VALPROIC ACID LEVEL: Valproic Acid Lvl: 34 ug/mL — ABNORMAL LOW (ref 50–100)

## 2015-05-30 ENCOUNTER — Telehealth: Payer: Self-pay | Admitting: Family Medicine

## 2015-05-30 LAB — UA/M W/RFLX CULTURE, ROUTINE

## 2015-05-30 NOTE — Telephone Encounter (Signed)
Her depakote level looks good and her urine culture did not grow out any bacteria. No UTI. Called patient's son and LMOM with the results.

## 2015-06-03 ENCOUNTER — Other Ambulatory Visit: Payer: Self-pay

## 2015-06-03 MED ORDER — GLUCOSE BLOOD VI STRP: ORAL_STRIP | Status: DC

## 2015-06-03 NOTE — Telephone Encounter (Signed)
LAST VISIT: 05/28/2015  Request for GE100  GlucoseTest Strips.

## 2015-06-17 ENCOUNTER — Telehealth: Payer: Self-pay | Admitting: Family Medicine

## 2015-06-17 NOTE — Telephone Encounter (Signed)
Merry Proud from [facility] called would like a verbal order for someone to come out and assist pt with bathing a few times a week. Please call him ASAP. Thanks.

## 2015-06-17 NOTE — Telephone Encounter (Signed)
Forward to provider

## 2015-06-17 NOTE — Telephone Encounter (Signed)
That's fine. Thanks!

## 2015-06-18 ENCOUNTER — Inpatient Hospital Stay: Payer: Medicare Other

## 2015-06-18 ENCOUNTER — Inpatient Hospital Stay: Payer: Medicare Other | Attending: Oncology | Admitting: Oncology

## 2015-06-18 ENCOUNTER — Encounter: Payer: Self-pay | Admitting: Oncology

## 2015-06-18 VITALS — BP 149/72 | HR 64 | Temp 99.3°F | Resp 20 | Wt 195.1 lb

## 2015-06-18 DIAGNOSIS — Z87891 Personal history of nicotine dependence: Secondary | ICD-10-CM | POA: Insufficient documentation

## 2015-06-18 DIAGNOSIS — E1165 Type 2 diabetes mellitus with hyperglycemia: Secondary | ICD-10-CM | POA: Diagnosis not present

## 2015-06-18 DIAGNOSIS — E785 Hyperlipidemia, unspecified: Secondary | ICD-10-CM | POA: Diagnosis not present

## 2015-06-18 DIAGNOSIS — R531 Weakness: Secondary | ICD-10-CM | POA: Diagnosis not present

## 2015-06-18 DIAGNOSIS — Z8719 Personal history of other diseases of the digestive system: Secondary | ICD-10-CM | POA: Diagnosis not present

## 2015-06-18 DIAGNOSIS — E039 Hypothyroidism, unspecified: Secondary | ICD-10-CM | POA: Diagnosis not present

## 2015-06-18 DIAGNOSIS — E119 Type 2 diabetes mellitus without complications: Secondary | ICD-10-CM | POA: Insufficient documentation

## 2015-06-18 DIAGNOSIS — I509 Heart failure, unspecified: Secondary | ICD-10-CM | POA: Insufficient documentation

## 2015-06-18 DIAGNOSIS — D509 Iron deficiency anemia, unspecified: Secondary | ICD-10-CM | POA: Insufficient documentation

## 2015-06-18 DIAGNOSIS — I480 Paroxysmal atrial fibrillation: Secondary | ICD-10-CM | POA: Insufficient documentation

## 2015-06-18 DIAGNOSIS — R5383 Other fatigue: Secondary | ICD-10-CM | POA: Insufficient documentation

## 2015-06-18 DIAGNOSIS — Z9981 Dependence on supplemental oxygen: Secondary | ICD-10-CM | POA: Insufficient documentation

## 2015-06-18 DIAGNOSIS — Z79899 Other long term (current) drug therapy: Secondary | ICD-10-CM | POA: Insufficient documentation

## 2015-06-18 DIAGNOSIS — R0602 Shortness of breath: Secondary | ICD-10-CM

## 2015-06-18 DIAGNOSIS — F0390 Unspecified dementia without behavioral disturbance: Secondary | ICD-10-CM | POA: Insufficient documentation

## 2015-06-18 DIAGNOSIS — I10 Essential (primary) hypertension: Secondary | ICD-10-CM | POA: Insufficient documentation

## 2015-06-18 DIAGNOSIS — I739 Peripheral vascular disease, unspecified: Secondary | ICD-10-CM | POA: Diagnosis not present

## 2015-06-18 DIAGNOSIS — F329 Major depressive disorder, single episode, unspecified: Secondary | ICD-10-CM | POA: Diagnosis not present

## 2015-06-18 LAB — CBC WITH DIFFERENTIAL/PLATELET
BASOS ABS: 0.1 10*3/uL (ref 0–0.1)
BASOS PCT: 1 %
EOS PCT: 2 %
Eosinophils Absolute: 0.1 10*3/uL (ref 0–0.7)
HCT: 36 % (ref 35.0–47.0)
Hemoglobin: 11.5 g/dL — ABNORMAL LOW (ref 12.0–16.0)
Lymphocytes Relative: 21 %
Lymphs Abs: 1.3 10*3/uL (ref 1.0–3.6)
MCH: 28.5 pg (ref 26.0–34.0)
MCHC: 32.1 g/dL (ref 32.0–36.0)
MCV: 88.8 fL (ref 80.0–100.0)
MONO ABS: 0.8 10*3/uL (ref 0.2–0.9)
MONOS PCT: 13 %
Neutro Abs: 4 10*3/uL (ref 1.4–6.5)
Neutrophils Relative %: 63 %
PLATELETS: 357 10*3/uL (ref 150–440)
RBC: 4.05 MIL/uL (ref 3.80–5.20)
RDW: 17.3 % — AB (ref 11.5–14.5)
WBC: 6.3 10*3/uL (ref 3.6–11.0)

## 2015-06-18 LAB — IRON AND TIBC
IRON: 251 ug/dL — AB (ref 28–170)
SATURATION RATIOS: 64 % — AB (ref 10.4–31.8)
TIBC: 390 ug/dL (ref 250–450)
UIBC: 139 ug/dL

## 2015-06-18 LAB — FERRITIN: FERRITIN: 36 ng/mL (ref 11–307)

## 2015-06-25 NOTE — Progress Notes (Signed)
[place]  [phone] [phone]  ID: Theresa Vasquez OB: [birth year]  MR#: [id number]  [id number]  Patient Care Team: Valerie Roys, DO as PCP - General (Family Medicine)  CHIEF COMPLAINT:  Chief Complaint  Patient presents with  . Iron Deficiency Anemia    INTERVAL HISTORY: Patient returns to clinic today for repeat laboratory work and further evaluation. She continues to have chronic weakness and fatigue which is unchanged. She has no neurologic complaints. She denies any fevers. She has a good appetite and denies weight loss. She has no chest pain, but is chronically short of breath and requires oxygen. She denies any nausea, vomiting, constipation, or diarrhea.  She has no urinary complaints. Patient offers no further specific complaints today.   REVIEW OF SYSTEMS:   Review of Systems  Constitutional: Positive for malaise/fatigue.  Respiratory: Positive for shortness of breath.   Cardiovascular: Negative.   Musculoskeletal:       Foot pain.  Neurological: Positive for weakness.    As per HPI. Otherwise, a complete review of systems is negatve.  PAST MEDICAL HISTORY: Past Medical History  Diagnosis Date  . Anemia   . Diabetes mellitus without complication ([place])   . Depression   . Hypertension   . Colitis   . Diverticulitis   . Hypothyroidism   . Hyperlipidemia   . PVD (peripheral vascular disease) ([place]) 05/14/2015  . CHF (congestive heart failure) ([place])   . Dementia   . Paroxysmal atrial fibrillation (HCC)     s/p cardioversion  . Iron deficiency anemia     PAST SURGICAL HISTORY: Past Surgical History  Procedure Laterality Date  . Abdominal hysterectomy      partial    FAMILY HISTORY: Diabetes.     ADVANCED DIRECTIVES:    HEALTH MAINTENANCE: Social History  Substance Use Topics  . Smoking status: Former Smoker -- 1.00 packs/day for 60 years    Types: Cigarettes  . Smokeless tobacco: Never Used  .  Alcohol Use: No     Colonoscopy:  PAP:  Bone density:  Lipid panel:  No Known Allergies  Current Outpatient Prescriptions  Medication Sig Dispense Refill  . albuterol (PROVENTIL) (2.5 MG/3ML) 0.083% nebulizer solution Take 2.5 mg by nebulization every 4 (four) hours as needed for wheezing or shortness of breath.    Marland Kitchen amiodarone (PACERONE) 200 MG tablet Take 200 mg by mouth daily.     . divalproex (DEPAKOTE) 250 MG DR tablet Take 1 tablet (250 mg total) by mouth 3 (three) times daily. 90 tablet 6  . donepezil (ARICEPT) 10 MG tablet Take 1 tablet (10 mg total) by mouth at bedtime. 30 tablet 6  . enalapril (VASOTEC) 5 MG tablet Take 1 tablet (5 mg total) by mouth 2 (two) times daily. 60 tablet 6  . famotidine (PEPCID) 20 MG tablet Take 20 mg by mouth 2 (two) times daily.    . ferrous sulfate 325 (65 FE) MG tablet Take 1 tablet (325 mg total) by mouth 2 (two) times daily with a meal. 60 tablet 6  . Fluticasone-Salmeterol (ADVAIR) 250-50 MCG/DOSE AEPB Inhale 1 puff into the lungs 2 (two) times daily. 60 each 6  . furosemide (LASIX) 40 MG tablet Take 1 tablet (40 mg total) by mouth 2 (two) times daily. 60 tablet 6  . gabapentin (NEURONTIN) 400 MG capsule Take 1 capsule (400 mg total) by mouth 2 (two) times daily. 60 capsule 6  . glucose blood (GE100 BLOOD GLUCOSE TEST) test strip  Test Blood Glucose Level Once Daily 50 each 12  . levothyroxine (SYNTHROID, LEVOTHROID) 150 MCG tablet Take 1 tablet (150 mcg total) by mouth daily before breakfast. 90 tablet 3  . LORazepam (ATIVAN) 0.5 MG tablet Take 1 tablet (0.5 mg total) by mouth at bedtime. 30 tablet 5  . Melatonin 3 MG TABS Take 1 tablet (3 mg total) by mouth at bedtime. 1 tablet 12  . metoprolol (LOPRESSOR) 50 MG tablet Take 0.5 tablets (25 mg total) by mouth 2 (two) times daily. 30 tablet 6  . pantoprazole (PROTONIX) 40 MG tablet Take 1 tablet (40 mg total) by mouth 2 (two) times daily. 60 tablet 6  . potassium chloride SA (K-DUR,KLOR-CON) 20  MEQ tablet Take 20 mEq by mouth 2 (two) times daily.    Marland Kitchen tiotropium (SPIRIVA) 18 MCG inhalation capsule Place 1 capsule (18 mcg total) into inhaler and inhale daily. 30 capsule 6  . traMADol (ULTRAM) 50 MG tablet Take 50 mg by mouth every 6 (six) hours as needed for moderate pain.     Marland Kitchen venlafaxine XR (EFFEXOR XR) 37.5 MG 24 hr capsule Take 1 capsule (37.5 mg total) by mouth daily with breakfast. 30 capsule 6   No current facility-administered medications for this visit.    OBJECTIVE: Filed Vitals:   06/18/15 1346  BP: 149/72  Pulse: 64  Temp: 99.3 F (37.4 C)  Resp: 20     Body mass index is 34.57 kg/(m^2).    ECOG FS:2 - Symptomatic, <50% confined to bed  General: Well-developed, well-nourished, no acute distress. Eyes: anicteric sclera. Lungs: Clear to auscultation bilaterally. Heart: Regular rate and rhythm. No rubs, murmurs, or gallops. Abdomen: Soft, nontender, nondistended. No organomegaly noted, normoactive bowel sounds. Musculoskeletal: No edema, cyanosis, or clubbing. Neuro: Alert, answering all questions appropriately. Cranial nerves grossly intact. Skin: No rashes or petechiae noted. Psych: Normal affect.    LAB RESULTS:  Lab Results  Component Value Date   NA 140 05/17/2015   K 3.6 05/17/2015   CL 98* 05/17/2015   CO2 32 05/17/2015   GLUCOSE 212* 05/17/2015   BUN 20 05/17/2015   CREATININE 0.86 05/17/2015   CALCIUM 8.1* 05/17/2015   PROT 6.0* 05/14/2015   ALBUMIN 2.9* 05/14/2015   AST 19 05/14/2015   ALT 11* 05/14/2015   ALKPHOS 47 05/14/2015   BILITOT <0.1* 05/14/2015   GFRNONAA >60 05/17/2015   GFRAA >60 05/17/2015    Lab Results  Component Value Date   WBC 6.3 06/18/2015   NEUTROABS 4.0 06/18/2015   HGB 11.5* 06/18/2015   HCT 36.0 06/18/2015   MCV 88.8 06/18/2015   PLT 357 06/18/2015     STUDIES: No results found.  ASSESSMENT: Iron deficiency anemia.  PLAN:   1. Iron deficiency anemia: Patient's hemoglobin is only mildly decreased  and improved. Her iron stores are within normal limits. She does not require additional IV iron today. She last received 510 mg IV Feraheme in June 2016. No intervention is needed at this time.  Return to clinic in 3 months for repeat laboratory work, further evaluation, and consideration of additional IV Feraheme. Patient and her son expressed understanding and were in agreement with this plan. 2. Shortness of breath: Continue oxygen as prescribed. 3. Hyperglycemia: Continue current diabetic medications. Treatment per PCP.   Lloyd Huger, MD   06/25/2015 1:52 PM

## 2015-07-03 ENCOUNTER — Encounter: Payer: Self-pay | Admitting: *Deleted

## 2015-07-04 ENCOUNTER — Encounter: Payer: Self-pay | Admitting: *Deleted

## 2015-07-04 ENCOUNTER — Encounter
Admission: RE | Disposition: A | Discharge status: Home / Self Care | Payer: Self-pay | Source: Ambulatory Visit | Attending: Unknown Physician Specialty

## 2015-07-04 ENCOUNTER — Ambulatory Visit: Payer: Medicare Other | Admitting: Anesthesiology

## 2015-07-04 ENCOUNTER — Ambulatory Visit
Admission: RE | Admit: 2015-07-04 | Discharge: 2015-07-04 | Disposition: A | Discharge status: Home / Self Care | Payer: Medicare Other | Source: Ambulatory Visit | Attending: Unknown Physician Specialty | Admitting: Unknown Physician Specialty

## 2015-07-04 DIAGNOSIS — I509 Heart failure, unspecified: Secondary | ICD-10-CM | POA: Insufficient documentation

## 2015-07-04 DIAGNOSIS — E1142 Type 2 diabetes mellitus with diabetic polyneuropathy: Secondary | ICD-10-CM | POA: Diagnosis not present

## 2015-07-04 DIAGNOSIS — I340 Nonrheumatic mitral (valve) insufficiency: Secondary | ICD-10-CM | POA: Diagnosis not present

## 2015-07-04 DIAGNOSIS — I739 Peripheral vascular disease, unspecified: Secondary | ICD-10-CM | POA: Diagnosis not present

## 2015-07-04 DIAGNOSIS — F329 Major depressive disorder, single episode, unspecified: Secondary | ICD-10-CM | POA: Diagnosis not present

## 2015-07-04 DIAGNOSIS — E1122 Type 2 diabetes mellitus with diabetic chronic kidney disease: Secondary | ICD-10-CM | POA: Diagnosis not present

## 2015-07-04 DIAGNOSIS — F0390 Unspecified dementia without behavioral disturbance: Secondary | ICD-10-CM | POA: Insufficient documentation

## 2015-07-04 DIAGNOSIS — R1310 Dysphagia, unspecified: Secondary | ICD-10-CM | POA: Diagnosis present

## 2015-07-04 DIAGNOSIS — E1165 Type 2 diabetes mellitus with hyperglycemia: Secondary | ICD-10-CM | POA: Diagnosis not present

## 2015-07-04 DIAGNOSIS — D473 Essential (hemorrhagic) thrombocythemia: Secondary | ICD-10-CM | POA: Insufficient documentation

## 2015-07-04 DIAGNOSIS — I480 Paroxysmal atrial fibrillation: Secondary | ICD-10-CM | POA: Insufficient documentation

## 2015-07-04 DIAGNOSIS — M810 Age-related osteoporosis without current pathological fracture: Secondary | ICD-10-CM | POA: Insufficient documentation

## 2015-07-04 DIAGNOSIS — Z79899 Other long term (current) drug therapy: Secondary | ICD-10-CM | POA: Diagnosis not present

## 2015-07-04 DIAGNOSIS — E039 Hypothyroidism, unspecified: Secondary | ICD-10-CM | POA: Insufficient documentation

## 2015-07-04 DIAGNOSIS — E872 Acidosis: Secondary | ICD-10-CM | POA: Insufficient documentation

## 2015-07-04 DIAGNOSIS — I447 Left bundle-branch block, unspecified: Secondary | ICD-10-CM | POA: Diagnosis not present

## 2015-07-04 DIAGNOSIS — Z87891 Personal history of nicotine dependence: Secondary | ICD-10-CM | POA: Diagnosis not present

## 2015-07-04 DIAGNOSIS — Z90710 Acquired absence of both cervix and uterus: Secondary | ICD-10-CM | POA: Diagnosis not present

## 2015-07-04 DIAGNOSIS — I130 Hypertensive heart and chronic kidney disease with heart failure and stage 1 through stage 4 chronic kidney disease, or unspecified chronic kidney disease: Secondary | ICD-10-CM | POA: Diagnosis not present

## 2015-07-04 DIAGNOSIS — Z833 Family history of diabetes mellitus: Secondary | ICD-10-CM | POA: Insufficient documentation

## 2015-07-04 DIAGNOSIS — K31819 Angiodysplasia of stomach and duodenum without bleeding: Secondary | ICD-10-CM | POA: Insufficient documentation

## 2015-07-04 DIAGNOSIS — E785 Hyperlipidemia, unspecified: Secondary | ICD-10-CM | POA: Insufficient documentation

## 2015-07-04 DIAGNOSIS — F419 Anxiety disorder, unspecified: Secondary | ICD-10-CM | POA: Insufficient documentation

## 2015-07-04 DIAGNOSIS — K922 Gastrointestinal hemorrhage, unspecified: Secondary | ICD-10-CM | POA: Insufficient documentation

## 2015-07-04 DIAGNOSIS — F0150 Vascular dementia without behavioral disturbance: Secondary | ICD-10-CM | POA: Diagnosis not present

## 2015-07-04 DIAGNOSIS — Z8711 Personal history of peptic ulcer disease: Secondary | ICD-10-CM | POA: Diagnosis not present

## 2015-07-04 DIAGNOSIS — N184 Chronic kidney disease, stage 4 (severe): Secondary | ICD-10-CM | POA: Insufficient documentation

## 2015-07-04 DIAGNOSIS — J449 Chronic obstructive pulmonary disease, unspecified: Secondary | ICD-10-CM | POA: Diagnosis not present

## 2015-07-04 HISTORY — DX: Type 2 diabetes mellitus with diabetic chronic kidney disease: E11.22

## 2015-07-04 HISTORY — DX: Barrett's esophagus without dysplasia: K22.70

## 2015-07-04 HISTORY — DX: Hypertensive chronic kidney disease with stage 1 through stage 4 chronic kidney disease, or unspecified chronic kidney disease: I12.9

## 2015-07-04 HISTORY — DX: Benign neoplasm, unspecified site: D36.9

## 2015-07-04 HISTORY — DX: Reserved for concepts with insufficient information to code with codable children: IMO0002

## 2015-07-04 HISTORY — DX: Type 2 diabetes mellitus with diabetic polyneuropathy: E11.42

## 2015-07-04 HISTORY — DX: Acidosis: E87.2

## 2015-07-04 HISTORY — DX: Respiratory failure, unspecified with hypoxia: J96.91

## 2015-07-04 HISTORY — DX: Other acidosis: E87.29

## 2015-07-04 HISTORY — DX: Chronic obstructive pulmonary disease, unspecified: J44.9

## 2015-07-04 HISTORY — DX: Unspecified osteoarthritis, unspecified site: M19.90

## 2015-07-04 HISTORY — DX: Respiratory failure, unspecified with hypercapnia: J96.92

## 2015-07-04 HISTORY — DX: Age-related osteoporosis without current pathological fracture: M81.0

## 2015-07-04 HISTORY — DX: Pneumonia, unspecified organism: J18.9

## 2015-07-04 HISTORY — DX: Essential (hemorrhagic) thrombocythemia: D47.3

## 2015-07-04 HISTORY — DX: Other specified symptoms and signs involving the circulatory and respiratory systems: R09.89

## 2015-07-04 HISTORY — DX: Gastrointestinal hemorrhage, unspecified: K92.2

## 2015-07-04 HISTORY — DX: Left bundle-branch block, unspecified: I44.7

## 2015-07-04 HISTORY — DX: Personal history of peptic ulcer disease: Z87.11

## 2015-07-04 HISTORY — DX: Vascular dementia, unspecified severity, without behavioral disturbance, psychotic disturbance, mood disturbance, and anxiety: F01.50

## 2015-07-04 HISTORY — DX: Unspecified atrial fibrillation: I48.91

## 2015-07-04 HISTORY — DX: Anxiety disorder, unspecified: F41.9

## 2015-07-04 HISTORY — DX: Edema, unspecified: R60.9

## 2015-07-04 HISTORY — DX: Nonrheumatic mitral (valve) insufficiency: I34.0

## 2015-07-04 HISTORY — DX: Diverticulosis of intestine, part unspecified, without perforation or abscess without bleeding: K57.90

## 2015-07-04 HISTORY — DX: Hypoxemia: R09.02

## 2015-07-04 HISTORY — DX: Type 2 diabetes mellitus with hyperglycemia: E11.65

## 2015-07-04 HISTORY — DX: Atherosclerosis of aorta: I70.0

## 2015-07-04 HISTORY — DX: Other psychoactive substance dependence, in remission: F19.21

## 2015-07-04 HISTORY — PX: ESOPHAGOGASTRODUODENOSCOPY (EGD) WITH PROPOFOL: SHX5813

## 2015-07-04 HISTORY — DX: Acidosis, unspecified: E87.20

## 2015-07-04 HISTORY — DX: Chronic pulmonary edema: J81.1

## 2015-07-04 HISTORY — DX: Vascular disorder of intestine, unspecified: K55.9

## 2015-07-04 LAB — GLUCOSE, CAPILLARY: Glucose-Capillary: 183 mg/dL — ABNORMAL HIGH (ref 65–99)

## 2015-07-04 SURGERY — ESOPHAGOGASTRODUODENOSCOPY (EGD) WITH PROPOFOL
Anesthesia: General

## 2015-07-04 MED ORDER — SODIUM CHLORIDE 0.9 % IV SOLN: INTRAVENOUS | Status: DC

## 2015-07-04 MED ORDER — SODIUM CHLORIDE 0.9 % IV SOLN
INTRAVENOUS | Status: DC
  Administered 2015-07-04: 09:00:00 via INTRAVENOUS

## 2015-07-04 MED ORDER — LIDOCAINE HCL (CARDIAC) 20 MG/ML IV SOLN
INTRAVENOUS | Status: DC | PRN
  Administered 2015-07-04: 67 mg via INTRAVENOUS

## 2015-07-04 MED ORDER — GLYCOPYRROLATE 0.2 MG/ML IJ SOLN
INTRAMUSCULAR | Status: DC | PRN
  Administered 2015-07-04: 0.2 mg via INTRAVENOUS

## 2015-07-04 MED ORDER — PROPOFOL 500 MG/50ML IV EMUL
INTRAVENOUS | Status: DC | PRN
  Administered 2015-07-04: 100 ug/kg/min via INTRAVENOUS

## 2015-07-04 MED ORDER — PROPOFOL 10 MG/ML IV BOLUS
INTRAVENOUS | Status: DC | PRN
  Administered 2015-07-04: 20 mg via INTRAVENOUS
  Administered 2015-07-04: 30 mg via INTRAVENOUS
  Administered 2015-07-04: 20 mg via INTRAVENOUS
  Administered 2015-07-04: 80 mg via INTRAVENOUS
  Administered 2015-07-04: 20 mg via INTRAVENOUS

## 2015-07-04 NOTE — Anesthesia Preprocedure Evaluation (Signed)
Anesthesia Evaluation  Patient identified by MRN, date of birth, ID band Patient confused    Reviewed: Allergy & Precautions, NPO status , Patient's Chart, lab work & pertinent test results  Airway Mallampati: III       Dental   Pulmonary COPD, former smoker,     + decreased breath sounds      Cardiovascular hypertension, Pt. on home beta blockers +CHF  + dysrhythmias  Rhythm:Irregular     Neuro/Psych    GI/Hepatic negative GI ROS,   Endo/Other  diabetes, Type 2, Oral Hypoglycemic AgentsHypothyroidism   Renal/GU      Musculoskeletal   Abdominal Normal abdominal exam  (+)   Peds  Hematology   Anesthesia Other Findings   Reproductive/Obstetrics                             Anesthesia Physical Anesthesia Plan  ASA: III  Anesthesia Plan: General   Post-op Pain Management:    Induction: Intravenous  Airway Management Planned: Nasal Cannula  Additional Equipment:   Intra-op Plan:   Post-operative Plan:   Informed Consent: I have reviewed the patients History and Physical, chart, labs and discussed the procedure including the risks, benefits and alternatives for the proposed anesthesia with the patient or authorized representative who has indicated his/her understanding and acceptance.     Plan Discussed with: CRNA  Anesthesia Plan Comments:         Anesthesia Quick Evaluation

## 2015-07-04 NOTE — Op Note (Signed)
[facility] Patient Name: Theresa Vasquez Procedure Date: 07/04/2015 8:42 AM MRN: [id number] Account #: [id number] Date of Birth: 23-Aug-1930 Admit Type: Outpatient Age: 81 Room: [facility] ENDO ROOM 1 Gender: Female Note Status: Finalized Procedure:         Upper GI endoscopy Indications:       Recent gastrointestinal bleeding Providers:         Manya Silvas, MD Referring MD:      Valerie Roys (Referring MD) Medicines:         Propofol per Anesthesia Complications:     No immediate complications. Procedure:         Pre-Anesthesia Assessment:                    - After reviewing the risks and benefits, the patient was                     deemed in satisfactory condition to undergo the procedure.                    After obtaining informed consent, the endoscope was passed                     under direct vision. Throughout the procedure, the                     patient's blood pressure, pulse, and oxygen saturations                     were monitored continuously. The Endoscope was introduced                     through the mouth, and advanced to the second part of                     duodenum. The upper GI endoscopy was accomplished without                     difficulty. The patient tolerated the procedure well. Findings:      There were esophageal mucosal changes consistent with short-segment       Barrett's esophagus present in the lower third of the esophagus. The       maximum longitudinal extent of these mucosal changes was 1 cm in length.       Mucosa was biopsied with a cold forceps for histology. One specimen       bottle was sent to pathology.      Striped mildly erythematous mucosa without bleeding was found in the       gastric antrum.      Two small angioectasias without bleeding were found in the second part       of the duodenum and in the third part of the duodenum. Coagulation for       tissue destruction using argon plasma  at 0.4 liters/minute and 20 watts       was successful. For hemostasis, five hemostatic clips were successfully       placed (MRI compatible). 3 on the proximal and 2 on the distal AVM./       There was no bleeding at the end of the procedure. Impression:        - Esophageal mucosal changes consistent with short-segment  Barrett's esophagus. Biopsied.                    - Erythematous mucosa in the antrum.                    - Two non-bleeding angioectasias in the duodenum. Treated                     with argon plasma coagulation (APC). MRI-compatible clips                     were placed. Recommendation:    - The findings and recommendations were discussed with the                     patient's family. Manya Silvas, MD 07/04/2015 9:34:06 AM This report has been signed electronically. Number of Addenda: 0 Note Initiated On: 07/04/2015 8:42 AM      [facility]

## 2015-07-04 NOTE — H&P (Signed)
Primary Care Physician:  Park Liter, DO Primary Gastroenterologist:  Dr. Vira Agar  Pre-Procedure History & Physical: HPI:  Theresa Vasquez is a 81 y.o. female is here for an endoscopy.   Past Medical History  Diagnosis Date  . Anemia   . Depression   . Hypertension   . Colitis   . Diverticulitis   . Hypothyroidism   . Hyperlipidemia   . PVD (peripheral vascular disease) ([place]) 05/14/2015  . CHF (congestive heart failure) ([place])   . Paroxysmal atrial fibrillation (H[facility])     s/p cardioversion  . Iron deficiency anemia   . Dementia   . Vascular dementia without behavioral disturbance   . Age-related osteoporosis without current pathological fracture   . Nonrheumatic mitral valve insufficiency   . Left bundle branch block   . Vascular disorder of intestine ([place])   . Hypoxemia   . Recurrent pneumonia     history of  . Personal history of peptic ulcer disease   . Gastrointestinal hemorrhage   . Edema   . Other psychoactive substance dependence, in remission ([place])   . Diverticulosis of intestine without perforation or abscess without bleeding   . COPD (chronic obstructive pulmonary disease) ([place])   . Anxiety   . Atherosclerosis of aorta ([place])   . Barrett's esophagus without dysplasia   . Hypertensive chronic kidney disease with stage 1 through stage 4 chronic kidney disease, or unspecified chronic kidney disease   . Other specified symptoms and signs involving the circulatory and respiratory systems   . Essential hemorrhagic thrombocythemia ([place])   . Atrial fibrillation with RVR ([place])   . Pulmonary edema   . Respiratory failure with hypoxia and hypercapnia ([place])   . Type 2 diabetes mellitus with diabetic polyneuropathy ([place])   . Type 2 diabetes mellitus with diabetic chronic kidney disease ([place])   . Diabetes mellitus type II, uncontrolled ([place])   . Lactic acidosis   . High anion gap metabolic acidosis   . Osteoarthritis   . Adenomatous polyps     Past Surgical  History  Procedure Laterality Date  . Abdominal hysterectomy      partial  . Colonoscopy    . Sigmoidoscopy    . Shoulder arthroscopy    . Appendectomy    . Esophagogastroduodenoscopy      Prior to Admission medications   Medication Sig Start Date End Date Taking? Authorizing Provider  amiodarone (PACERONE) 200 MG tablet Take 200 mg by mouth daily.    Yes Historical Provider, MD  Cholecalciferol (VITAMIN D-3) 1000 UNITS CAPS Take 1,000 Units by mouth daily.   Yes Historical Provider, MD  donepezil (ARICEPT) 10 MG tablet Take 1 tablet (10 mg total) by mouth at bedtime. 05/28/15  Yes Megan P Johnson, DO  enalapril (VASOTEC) 5 MG tablet Take 1 tablet (5 mg total) by mouth 2 (two) times daily. 05/28/15  Yes Megan P Johnson, DO  ferrous sulfate 325 (65 FE) MG tablet Take 1 tablet (325 mg total) by mouth 2 (two) times daily with a meal. 05/28/15  Yes Megan P Johnson, DO  furosemide (LASIX) 40 MG tablet Take 1 tablet (40 mg total) by mouth 2 (two) times daily. 05/28/15  Yes Megan P Johnson, DO  gabapentin (NEURONTIN) 400 MG capsule Take 1 capsule (400 mg total) by mouth 2 (two) times daily. 05/28/15  Yes Megan P Johnson, DO  glipiZIDE (GLUCOTROL) 5 MG tablet Take 5 mg by mouth daily before breakfast.   Yes Historical Provider, MD  levothyroxine (  SYNTHROID, LEVOTHROID) 150 MCG tablet Take 1 tablet (150 mcg total) by mouth daily before breakfast. 05/02/15  Yes Megan P Johnson, DO  metoprolol (LOPRESSOR) 50 MG tablet Take 0.5 tablets (25 mg total) by mouth 2 (two) times daily. 05/28/15  Yes Megan P Johnson, DO  pantoprazole (PROTONIX) 40 MG tablet Take 1 tablet (40 mg total) by mouth 2 (two) times daily. 05/28/15  Yes Megan P Johnson, DO  potassium chloride SA (K-DUR,KLOR-CON) 20 MEQ tablet Take 20 mEq by mouth 2 (two) times daily.   Yes Historical Provider, MD  tiotropium (SPIRIVA) 18 MCG inhalation capsule Place 1 capsule (18 mcg total) into inhaler and inhale daily. 05/28/15  Yes Megan P Johnson, DO   albuterol (PROVENTIL) (2.5 MG/3ML) 0.083% nebulizer solution Take 2.5 mg by nebulization every 4 (four) hours as needed for wheezing or shortness of breath.    Historical Provider, MD  divalproex (DEPAKOTE) 250 MG DR tablet Take 1 tablet (250 mg total) by mouth 3 (three) times daily. 05/28/15   Megan P Johnson, DO  famotidine (PEPCID) 20 MG tablet Take 20 mg by mouth 2 (two) times daily.    Historical Provider, MD  Fluticasone-Salmeterol (ADVAIR) 250-50 MCG/DOSE AEPB Inhale 1 puff into the lungs 2 (two) times daily. 05/28/15   Megan P Johnson, DO  glucose blood (GE100 BLOOD GLUCOSE TEST) test strip Test Blood Glucose Level Once Daily 06/03/15   Kathrine Haddock, NP  LORazepam (ATIVAN) 0.5 MG tablet Take 1 tablet (0.5 mg total) by mouth at bedtime. 05/28/15   Megan P Johnson, DO  Melatonin 3 MG TABS Take 1 tablet (3 mg total) by mouth at bedtime. 05/28/15   Megan P Johnson, DO  traMADol (ULTRAM) 50 MG tablet Take 50 mg by mouth every 6 (six) hours as needed for moderate pain.     Historical Provider, MD  venlafaxine XR (EFFEXOR XR) 37.5 MG 24 hr capsule Take 1 capsule (37.5 mg total) by mouth daily with breakfast. 05/28/15   Valerie Roys, DO    Allergies as of 07/01/2015  . (No Known Allergies)    Family History  Problem Relation Age of Onset  . Diabetes Mother   . Diabetes Father     Social History   Social History  . Marital Status: Divorced    Spouse Name: N/A  . Number of Children: N/A  . Years of Education: N/A   Occupational History  . Not on file.   Social History Main Topics  . Smoking status: Former Smoker -- 1.00 packs/day for 60 years    Types: Cigarettes  . Smokeless tobacco: Never Used  . Alcohol Use: No  . Drug Use: No  . Sexual Activity: Not on file   Other Topics Concern  . Not on file   Social History Narrative   Lives at home, has 24 x 7 caregiver and ambulates with walker.    Review of Systems: See HPI, otherwise negative ROS.  Patient off  anticoagulants  Physical Exam: BP 174/63 mmHg  Pulse 57  Temp(Src) 98.7 F (37.1 C) (Tympanic)  Resp 18  Ht 5\' 7"  (1.702 m)  SpO2 94% General:   Alert,  pleasant and cooperative in NAD Head:  Normocephalic and atraumatic. Neck:  Supple; no masses or thyromegaly. Lungs:  Clear throughout to auscultation.    Heart:  Regular rate and rhythm. Abdomen:  Soft, nontender and nondistended. Normal bowel sounds, without guarding, and without rebound.   Neurologic:  Alert and  oriented x4;  grossly normal  neurologically.  Impression/Plan: Theresa Vasquez is here for an endoscopy to be performed for anemia,   Risks, benefits, limitations, and alternatives regarding  Endoscopy  have been reviewed with the patient.  Questions have been answered.  All parties agreeable.   Gaylyn Cheers, MD  07/04/2015, 8:50 AM

## 2015-07-04 NOTE — Anesthesia Postprocedure Evaluation (Signed)
Anesthesia Post Note  Patient: Theresa Vasquez  Procedure(s) Performed: Procedure(s) (LRB): ESOPHAGOGASTRODUODENOSCOPY (EGD) WITH PROPOFOL (N/A)  Patient location during evaluation: PACU Anesthesia Type: General Level of consciousness: awake Pain management: satisfactory to patient Vital Signs Assessment: post-procedure vital signs reviewed and stable Respiratory status: spontaneous breathing Cardiovascular status: stable Anesthetic complications: no    Last Vitals:  Filed Vitals:   07/04/15 0936 07/04/15 0937  BP: 183/84 183/84  Pulse: 63 62  Temp: 36.2 C 36.1 C  Resp: 14 16    Last Pain: There were no vitals filed for this visit.               VAN STAVEREN,Malka Bocek

## 2015-07-04 NOTE — Transfer of Care (Signed)
Immediate Anesthesia Transfer of Care Note  Patient: Theresa Vasquez  Procedure(s) Performed: Procedure(s): ESOPHAGOGASTRODUODENOSCOPY (EGD) WITH PROPOFOL (N/A)  Patient Location: PACU  Anesthesia Type:General  Level of Consciousness: awake and patient cooperative  Airway & Oxygen Therapy: Patient Spontanous Breathing and Patient connected to nasal cannula oxygen  Post-op Assessment: Report given to RN and Post -op Vital signs reviewed and stable  Post vital signs: Reviewed and stable  Last Vitals:  Filed Vitals:   07/04/15 0822 07/04/15 0937  BP: 174/63 183/84  Pulse: 57 62  Temp: 37.1 C 36.1 C  Resp: 18 16    Complications: No apparent anesthesia complications

## 2015-07-05 LAB — SURGICAL PATHOLOGY

## 2015-07-06 ENCOUNTER — Encounter: Payer: Self-pay | Admitting: Unknown Physician Specialty

## 2015-07-16 ENCOUNTER — Telehealth: Payer: Self-pay

## 2015-07-16 MED ORDER — VITAMIN D-3 25 MCG (1000 UT) PO CAPS: 1000.0000 [IU] | ORAL_CAPSULE | Freq: Every day | ORAL | Status: DC

## 2015-07-16 NOTE — Telephone Encounter (Signed)
Vitamin D 3 1000 IU  Take one capsule daily with lunch

## 2015-07-16 NOTE — Telephone Encounter (Signed)
This encounter was created in error - please disregard.

## 2015-07-16 NOTE — Telephone Encounter (Signed)
Rx sent to her pharmacy 

## 2015-07-18 ENCOUNTER — Emergency Department: Payer: Medicare Other

## 2015-07-18 ENCOUNTER — Inpatient Hospital Stay
Admission: EM | Admit: 2015-07-18 | Discharge: 2015-07-22 | DRG: 871 | Disposition: A | Discharge status: Home Health | Payer: Medicare Other | Attending: Internal Medicine | Admitting: Internal Medicine

## 2015-07-18 ENCOUNTER — Telehealth: Payer: Self-pay

## 2015-07-18 ENCOUNTER — Encounter: Payer: Self-pay | Admitting: Emergency Medicine

## 2015-07-18 DIAGNOSIS — D72829 Elevated white blood cell count, unspecified: Secondary | ICD-10-CM | POA: Diagnosis not present

## 2015-07-18 DIAGNOSIS — E1122 Type 2 diabetes mellitus with diabetic chronic kidney disease: Secondary | ICD-10-CM | POA: Diagnosis present

## 2015-07-18 DIAGNOSIS — E039 Hypothyroidism, unspecified: Secondary | ICD-10-CM | POA: Diagnosis present

## 2015-07-18 DIAGNOSIS — R6510 Systemic inflammatory response syndrome (SIRS) of non-infectious origin without acute organ dysfunction: Secondary | ICD-10-CM | POA: Diagnosis present

## 2015-07-18 DIAGNOSIS — I5031 Acute diastolic (congestive) heart failure: Secondary | ICD-10-CM | POA: Diagnosis present

## 2015-07-18 DIAGNOSIS — T380X5A Adverse effect of glucocorticoids and synthetic analogues, initial encounter: Secondary | ICD-10-CM | POA: Diagnosis not present

## 2015-07-18 DIAGNOSIS — I739 Peripheral vascular disease, unspecified: Secondary | ICD-10-CM | POA: Diagnosis present

## 2015-07-18 DIAGNOSIS — J9600 Acute respiratory failure, unspecified whether with hypoxia or hypercapnia: Secondary | ICD-10-CM | POA: Diagnosis not present

## 2015-07-18 DIAGNOSIS — E1165 Type 2 diabetes mellitus with hyperglycemia: Secondary | ICD-10-CM | POA: Diagnosis present

## 2015-07-18 DIAGNOSIS — F329 Major depressive disorder, single episode, unspecified: Secondary | ICD-10-CM | POA: Diagnosis present

## 2015-07-18 DIAGNOSIS — I480 Paroxysmal atrial fibrillation: Secondary | ICD-10-CM | POA: Diagnosis present

## 2015-07-18 DIAGNOSIS — F0151 Vascular dementia with behavioral disturbance: Secondary | ICD-10-CM | POA: Diagnosis present

## 2015-07-18 DIAGNOSIS — Z87891 Personal history of nicotine dependence: Secondary | ICD-10-CM | POA: Diagnosis not present

## 2015-07-18 DIAGNOSIS — E1142 Type 2 diabetes mellitus with diabetic polyneuropathy: Secondary | ICD-10-CM | POA: Diagnosis present

## 2015-07-18 DIAGNOSIS — A419 Sepsis, unspecified organism: Principal | ICD-10-CM | POA: Diagnosis present

## 2015-07-18 DIAGNOSIS — J189 Pneumonia, unspecified organism: Secondary | ICD-10-CM | POA: Insufficient documentation

## 2015-07-18 DIAGNOSIS — F0391 Unspecified dementia with behavioral disturbance: Secondary | ICD-10-CM

## 2015-07-18 DIAGNOSIS — M810 Age-related osteoporosis without current pathological fracture: Secondary | ICD-10-CM | POA: Diagnosis present

## 2015-07-18 DIAGNOSIS — Z79899 Other long term (current) drug therapy: Secondary | ICD-10-CM | POA: Diagnosis not present

## 2015-07-18 DIAGNOSIS — I10 Essential (primary) hypertension: Secondary | ICD-10-CM

## 2015-07-18 DIAGNOSIS — J9601 Acute respiratory failure with hypoxia: Secondary | ICD-10-CM | POA: Diagnosis present

## 2015-07-18 DIAGNOSIS — J9602 Acute respiratory failure with hypercapnia: Secondary | ICD-10-CM | POA: Diagnosis present

## 2015-07-18 DIAGNOSIS — J441 Chronic obstructive pulmonary disease with (acute) exacerbation: Secondary | ICD-10-CM | POA: Diagnosis present

## 2015-07-18 DIAGNOSIS — N184 Chronic kidney disease, stage 4 (severe): Secondary | ICD-10-CM | POA: Diagnosis present

## 2015-07-18 DIAGNOSIS — I130 Hypertensive heart and chronic kidney disease with heart failure and stage 1 through stage 4 chronic kidney disease, or unspecified chronic kidney disease: Secondary | ICD-10-CM | POA: Diagnosis present

## 2015-07-18 DIAGNOSIS — E785 Hyperlipidemia, unspecified: Secondary | ICD-10-CM | POA: Diagnosis present

## 2015-07-18 DIAGNOSIS — I080 Rheumatic disorders of both mitral and aortic valves: Secondary | ICD-10-CM | POA: Diagnosis present

## 2015-07-18 DIAGNOSIS — J440 Chronic obstructive pulmonary disease with acute lower respiratory infection: Secondary | ICD-10-CM | POA: Diagnosis present

## 2015-07-18 DIAGNOSIS — F03918 Unspecified dementia, unspecified severity, with other behavioral disturbance: Secondary | ICD-10-CM

## 2015-07-18 DIAGNOSIS — K2270 Barrett's esophagus without dysplasia: Secondary | ICD-10-CM | POA: Diagnosis present

## 2015-07-18 LAB — BLOOD GAS, ARTERIAL
ACID-BASE EXCESS: 0.2 mmol/L (ref 0.0–3.0)
BICARBONATE: 26.9 meq/L (ref 21.0–28.0)
Delivery systems: POSITIVE
Expiratory PAP: 8
FIO2: 60
Inspiratory PAP: 12
MECHANICAL RATE: 10
O2 Saturation: 99 %
PATIENT TEMPERATURE: 37
pCO2 arterial: 51 mmHg — ABNORMAL HIGH (ref 32.0–48.0)
pH, Arterial: 7.33 — ABNORMAL LOW (ref 7.350–7.450)
pO2, Arterial: 138 mmHg — ABNORMAL HIGH (ref 83.0–108.0)

## 2015-07-18 LAB — BASIC METABOLIC PANEL
Anion gap: 11 (ref 5–15)
BUN: 12 mg/dL (ref 6–20)
CHLORIDE: 99 mmol/L — AB (ref 101–111)
CO2: 27 mmol/L (ref 22–32)
Calcium: 8.1 mg/dL — ABNORMAL LOW (ref 8.9–10.3)
Creatinine, Ser: 0.71 mg/dL (ref 0.44–1.00)
GFR calc Af Amer: >60 mL/min (ref >60)
GLUCOSE: 320 mg/dL — AB (ref 65–99)
POTASSIUM: 3.6 mmol/L (ref 3.5–5.1)
Sodium: 137 mmol/L (ref 135–145)

## 2015-07-18 LAB — CBC
HCT: 40.6 % (ref 35.0–47.0)
HEMOGLOBIN: 12.9 g/dL (ref 12.0–16.0)
MCH: 29 pg (ref 26.0–34.0)
MCHC: 31.7 g/dL — AB (ref 32.0–36.0)
MCV: 91.5 fL (ref 80.0–100.0)
Platelets: 394 10*3/uL (ref 150–440)
RBC: 4.44 MIL/uL (ref 3.80–5.20)
RDW: 16.7 % — ABNORMAL HIGH (ref 11.5–14.5)
WBC: 12.4 10*3/uL — ABNORMAL HIGH (ref 3.6–11.0)

## 2015-07-18 LAB — CREATININE, SERUM: GFR calc Af Amer: >60 mL/min (ref >60)

## 2015-07-18 LAB — GLUCOSE, CAPILLARY
GLUCOSE-CAPILLARY: 355 mg/dL — AB (ref 65–99)
GLUCOSE-CAPILLARY: 240 mg/dL — AB (ref 65–99)
Glucose-Capillary: 306 mg/dL — ABNORMAL HIGH (ref 65–99)

## 2015-07-18 LAB — TROPONIN I: Troponin I: 0.03 ng/mL (ref <0.031)

## 2015-07-18 MED ORDER — INSULIN GLARGINE 100 UNIT/ML ~~LOC~~ SOLN
10.0000 [IU] | Freq: Every day | SUBCUTANEOUS | Status: DC
  Administered 2015-07-18 – 2015-07-21 (×4): 10 [IU] via SUBCUTANEOUS
  Filled 2015-07-18 (×5): qty 0.1

## 2015-07-18 MED ORDER — DEXTROSE 5 % IV SOLN
500.0000 mg | INTRAVENOUS | Status: DC
  Filled 2015-07-18 (×4): qty 500

## 2015-07-18 MED ORDER — TIOTROPIUM BROMIDE MONOHYDRATE 18 MCG IN CAPS
18.0000 ug | ORAL_CAPSULE | Freq: Every day | RESPIRATORY_TRACT | Status: DC
  Administered 2015-07-18 – 2015-07-22 (×5): 18 ug via RESPIRATORY_TRACT
  Filled 2015-07-18: qty 5

## 2015-07-18 MED ORDER — BUDESONIDE 0.25 MG/2ML IN SUSP
0.2500 mg | Freq: Two times a day (BID) | RESPIRATORY_TRACT | Status: DC
  Administered 2015-07-18 – 2015-07-22 (×9): 0.25 mg via RESPIRATORY_TRACT
  Filled 2015-07-18 (×9): qty 2

## 2015-07-18 MED ORDER — CETYLPYRIDINIUM CHLORIDE 0.05 % MT LIQD
7.0000 mL | Freq: Two times a day (BID) | OROMUCOSAL | Status: DC
  Administered 2015-07-18 – 2015-07-22 (×6): 7 mL via OROMUCOSAL

## 2015-07-18 MED ORDER — ENALAPRIL MALEATE 5 MG PO TABS
5.0000 mg | ORAL_TABLET | Freq: Two times a day (BID) | ORAL | Status: DC
  Administered 2015-07-18 – 2015-07-19 (×3): 5 mg via ORAL
  Filled 2015-07-18: qty 1
  Filled 2015-07-18: qty 2
  Filled 2015-07-18 (×2): qty 1

## 2015-07-18 MED ORDER — DIVALPROEX SODIUM 250 MG PO DR TAB
250.0000 mg | DELAYED_RELEASE_TABLET | Freq: Three times a day (TID) | ORAL | Status: DC
  Administered 2015-07-18 – 2015-07-22 (×14): 250 mg via ORAL
  Filled 2015-07-18 (×16): qty 1

## 2015-07-18 MED ORDER — MELATONIN 3 MG PO TABS: 3.0000 mg | ORAL_TABLET | Freq: Every day | ORAL | Status: DC

## 2015-07-18 MED ORDER — METHYLPREDNISOLONE SODIUM SUCC 125 MG IJ SOLR
125.0000 mg | Freq: Once | INTRAMUSCULAR | Status: AC
  Administered 2015-07-18: 125 mg via INTRAVENOUS
  Filled 2015-07-18: qty 2

## 2015-07-18 MED ORDER — FERROUS SULFATE 325 (65 FE) MG PO TABS
325.0000 mg | ORAL_TABLET | Freq: Two times a day (BID) | ORAL | Status: DC
  Administered 2015-07-18 – 2015-07-22 (×8): 325 mg via ORAL
  Filled 2015-07-18 (×8): qty 1

## 2015-07-18 MED ORDER — VANCOMYCIN HCL IN DEXTROSE 1-5 GM/200ML-% IV SOLN
1000.0000 mg | Freq: Once | INTRAVENOUS | Status: AC
  Administered 2015-07-18: 1000 mg via INTRAVENOUS
  Filled 2015-07-18: qty 200

## 2015-07-18 MED ORDER — VENLAFAXINE HCL ER 37.5 MG PO CP24
37.5000 mg | ORAL_CAPSULE | Freq: Every day | ORAL | Status: DC
  Administered 2015-07-18 – 2015-07-22 (×5): 37.5 mg via ORAL
  Filled 2015-07-18 (×6): qty 1

## 2015-07-18 MED ORDER — IPRATROPIUM-ALBUTEROL 0.5-2.5 (3) MG/3ML IN SOLN
3.0000 mL | RESPIRATORY_TRACT | Status: DC
  Administered 2015-07-18 – 2015-07-20 (×12): 3 mL via RESPIRATORY_TRACT
  Filled 2015-07-18 (×12): qty 3

## 2015-07-18 MED ORDER — FUROSEMIDE 40 MG PO TABS
40.0000 mg | ORAL_TABLET | Freq: Two times a day (BID) | ORAL | Status: DC
  Administered 2015-07-18 – 2015-07-22 (×9): 40 mg via ORAL
  Filled 2015-07-18 (×9): qty 1

## 2015-07-18 MED ORDER — HEPARIN SODIUM (PORCINE) 5000 UNIT/ML IJ SOLN
5000.0000 [IU] | Freq: Three times a day (TID) | INTRAMUSCULAR | Status: DC
  Administered 2015-07-18 – 2015-07-22 (×12): 5000 [IU] via SUBCUTANEOUS
  Filled 2015-07-18 (×13): qty 1

## 2015-07-18 MED ORDER — PANTOPRAZOLE SODIUM 40 MG PO TBEC
40.0000 mg | DELAYED_RELEASE_TABLET | Freq: Two times a day (BID) | ORAL | Status: DC
  Administered 2015-07-18 – 2015-07-22 (×9): 40 mg via ORAL
  Filled 2015-07-18 (×9): qty 1

## 2015-07-18 MED ORDER — IPRATROPIUM-ALBUTEROL 0.5-2.5 (3) MG/3ML IN SOLN
3.0000 mL | Freq: Once | RESPIRATORY_TRACT | Status: AC
  Administered 2015-07-18: 3 mL via RESPIRATORY_TRACT
  Filled 2015-07-18: qty 3

## 2015-07-18 MED ORDER — PIPERACILLIN-TAZOBACTAM 3.375 G IVPB
3.3750 g | Freq: Once | INTRAVENOUS | Status: AC
  Administered 2015-07-18: 3.375 g via INTRAVENOUS
  Filled 2015-07-18: qty 50

## 2015-07-18 MED ORDER — POTASSIUM CHLORIDE CRYS ER 20 MEQ PO TBCR
20.0000 meq | EXTENDED_RELEASE_TABLET | Freq: Two times a day (BID) | ORAL | Status: DC
  Administered 2015-07-18 – 2015-07-22 (×9): 20 meq via ORAL
  Filled 2015-07-18 (×9): qty 1

## 2015-07-18 MED ORDER — LEVOTHYROXINE SODIUM 150 MCG PO TABS
150.0000 ug | ORAL_TABLET | Freq: Every day | ORAL | Status: DC
  Administered 2015-07-18 – 2015-07-22 (×5): 150 ug via ORAL
  Filled 2015-07-18 (×6): qty 1

## 2015-07-18 MED ORDER — METOPROLOL TARTRATE 25 MG PO TABS
25.0000 mg | ORAL_TABLET | Freq: Two times a day (BID) | ORAL | Status: DC
  Administered 2015-07-18 – 2015-07-21 (×8): 25 mg via ORAL
  Filled 2015-07-18 (×9): qty 1

## 2015-07-18 MED ORDER — TRAMADOL HCL 50 MG PO TABS: 50.0000 mg | ORAL_TABLET | Freq: Four times a day (QID) | ORAL | Status: DC | PRN

## 2015-07-18 MED ORDER — NITROGLYCERIN 2 % TD OINT
TOPICAL_OINTMENT | TRANSDERMAL | Status: AC
  Administered 2015-07-18: 0.5 [in_us] via TOPICAL
  Filled 2015-07-18: qty 1

## 2015-07-18 MED ORDER — INSULIN ASPART 100 UNIT/ML ~~LOC~~ SOLN
0.0000 [IU] | Freq: Every day | SUBCUTANEOUS | Status: DC
  Administered 2015-07-18: 23:00:00 4 [IU] via SUBCUTANEOUS
  Administered 2015-07-19: 2 [IU] via SUBCUTANEOUS
  Administered 2015-07-20 – 2015-07-21 (×2): 3 [IU] via SUBCUTANEOUS
  Filled 2015-07-18 (×2): qty 3
  Filled 2015-07-18: qty 4
  Filled 2015-07-18: qty 2

## 2015-07-18 MED ORDER — AMIODARONE HCL 200 MG PO TABS
200.0000 mg | ORAL_TABLET | Freq: Every day | ORAL | Status: DC
  Administered 2015-07-18 – 2015-07-22 (×5): 200 mg via ORAL
  Filled 2015-07-18 (×5): qty 1

## 2015-07-18 MED ORDER — DEXTROSE 5 % IV SOLN
1.0000 g | INTRAVENOUS | Status: DC
  Administered 2015-07-18 – 2015-07-22 (×5): 1 g via INTRAVENOUS
  Filled 2015-07-18 (×5): qty 10

## 2015-07-18 MED ORDER — INSULIN ASPART 100 UNIT/ML ~~LOC~~ SOLN
0.0000 [IU] | Freq: Three times a day (TID) | SUBCUTANEOUS | Status: DC
Start: 2015-07-18 — End: 2015-07-22
  Administered 2015-07-18 – 2015-07-19 (×3): 7 [IU] via SUBCUTANEOUS
  Administered 2015-07-19: 12:00:00 15 [IU] via SUBCUTANEOUS
  Administered 2015-07-20 (×2): 7 [IU] via SUBCUTANEOUS
  Administered 2015-07-20 – 2015-07-21 (×2): 11 [IU] via SUBCUTANEOUS
  Administered 2015-07-21: 7 [IU] via SUBCUTANEOUS
  Administered 2015-07-21: 17:00:00 11 [IU] via SUBCUTANEOUS
  Administered 2015-07-22: 7 [IU] via SUBCUTANEOUS
  Administered 2015-07-22: 08:00:00 11 [IU] via SUBCUTANEOUS
  Filled 2015-07-18: qty 7
  Filled 2015-07-18: qty 11
  Filled 2015-07-18 (×2): qty 7
  Filled 2015-07-18: qty 20
  Filled 2015-07-18: qty 7
  Filled 2015-07-18 (×2): qty 11
  Filled 2015-07-18: qty 15
  Filled 2015-07-18 (×2): qty 7
  Filled 2015-07-18: qty 11
  Filled 2015-07-18: qty 7

## 2015-07-18 MED ORDER — PREDNISONE 20 MG PO TABS: 50.0000 mg | ORAL_TABLET | Freq: Every day | ORAL | Status: DC

## 2015-07-18 MED ORDER — FAMOTIDINE 20 MG PO TABS
20.0000 mg | ORAL_TABLET | Freq: Two times a day (BID) | ORAL | Status: DC
  Administered 2015-07-18 – 2015-07-22 (×9): 20 mg via ORAL
  Filled 2015-07-18 (×10): qty 1

## 2015-07-18 MED ORDER — INSULIN ASPART 100 UNIT/ML ~~LOC~~ SOLN
0.0000 [IU] | Freq: Three times a day (TID) | SUBCUTANEOUS | Status: DC
  Administered 2015-07-18: 20 [IU] via SUBCUTANEOUS

## 2015-07-18 MED ORDER — AZITHROMYCIN 500 MG IV SOLR
500.0000 mg | INTRAVENOUS | Status: DC
Start: 2015-07-18 — End: 2015-07-19
  Administered 2015-07-18 – 2015-07-19 (×2): 500 mg via INTRAVENOUS
  Filled 2015-07-18 (×2): qty 500

## 2015-07-18 MED ORDER — NITROGLYCERIN 2 % TD OINT
0.5000 [in_us] | TOPICAL_OINTMENT | Freq: Once | TRANSDERMAL | Status: AC
  Administered 2015-07-18: 0.5 [in_us] via TOPICAL

## 2015-07-18 MED ORDER — VANCOMYCIN HCL IN DEXTROSE 1-5 GM/200ML-% IV SOLN
1000.0000 mg | INTRAVENOUS | Status: DC
  Filled 2015-07-18: qty 200

## 2015-07-18 MED ORDER — GABAPENTIN 400 MG PO CAPS
400.0000 mg | ORAL_CAPSULE | Freq: Two times a day (BID) | ORAL | Status: DC
  Administered 2015-07-18 – 2015-07-22 (×9): 400 mg via ORAL
  Filled 2015-07-18 (×10): qty 1

## 2015-07-18 MED ORDER — INSULIN ASPART 100 UNIT/ML ~~LOC~~ SOLN: 0.0000 [IU] | Freq: Three times a day (TID) | SUBCUTANEOUS | Status: DC

## 2015-07-18 MED ORDER — DONEPEZIL HCL 5 MG PO TABS
10.0000 mg | ORAL_TABLET | Freq: Every day | ORAL | Status: DC
  Administered 2015-07-18 – 2015-07-21 (×4): 10 mg via ORAL
  Filled 2015-07-18 (×4): qty 2

## 2015-07-18 MED ORDER — INSULIN ASPART 100 UNIT/ML ~~LOC~~ SOLN: 0.0000 [IU] | SUBCUTANEOUS | Status: DC

## 2015-07-18 MED ORDER — GLIPIZIDE 5 MG PO TABS: 5.0000 mg | ORAL_TABLET | Freq: Every day | ORAL | Status: DC

## 2015-07-18 MED ORDER — METHYLPREDNISOLONE SODIUM SUCC 40 MG IJ SOLR
40.0000 mg | Freq: Two times a day (BID) | INTRAMUSCULAR | Status: DC
  Administered 2015-07-18 – 2015-07-22 (×9): 40 mg via INTRAVENOUS
  Filled 2015-07-18 (×9): qty 1

## 2015-07-18 MED ORDER — LORAZEPAM 2 MG/ML IJ SOLN
0.5000 mg | Freq: Once | INTRAMUSCULAR | Status: AC
  Administered 2015-07-19: 02:00:00 0.5 mg via INTRAVENOUS
  Filled 2015-07-18: qty 1

## 2015-07-18 NOTE — ED Notes (Signed)
Pt presents to ED from home via [facility] c/o respiratory distress. Upon EMS arrival, EMS states "pateitn was blue, skin was cool, pale and diaphoretic." Per EMS patient O2 saturation was 55% on 2L nasal cannula. Per EMS pt is normally on 2L nasal cannula at home. Pt was placed on CPAP by EMS with O2 saturation at 100% upon their arrival to hospital. Pt has hx dementia, COPD, and CHF. Pt tolerating CPAP well from EMS. Upon pt arrival to treatment room pt pink in color, skin warm and dry, pt able to respond to yes or no questions. Pt alert to self and place, but not to situation or date. Pt placed on BiPap upon arrival to hospital. Pt denies any pain at this time.

## 2015-07-18 NOTE — Progress Notes (Signed)
Inpatient Diabetes Program Recommendations  AACE/ADA: New Consensus Statement on Inpatient Glycemic Control (2015)  Target Ranges:  Prepandial:   less than 140 mg/dL      Peak postprandial:   less than 180 mg/dL (1-2 hours)      Critically ill patients:  140 - 180 mg/dL   Review of Glycemic Control  Results for JAHMAYA, SIEMER (MRN [id number]) as of 07/18/2015 12:03  Ref. Range 07/04/2015 08:31  Glucose-Capillary Latest Ref Range: 65-99 mg/dL 183 (H)    Diabetes history: Type 2 Outpatient Diabetes medications: none documented in home med list Current orders for Inpatient glycemic control: Glipizide 5mg /day, Novolog 0-15 units q4h  *steroids 40mg  q12h  Inpatient Diabetes Program Recommendations:  Please consider ordering an A1C to determine control over the past 2-3 months.  Consider stopping Glipizide and starting Lantus 10 units qhs (0.11units/kg).   Consider increasing Novolog correction to resistance scale 0-20 units q4h (tid if she begins to eat)  Will follow.  Gentry Fitz, RN, BA, MHA, CDE Diabetes Coordinator Inpatient Diabetes Program  [phone] (Team Pager) [phone] ([place]) 07/18/2015 1:13 PM

## 2015-07-18 NOTE — H&P (Signed)
[facility] NAME: Theresa Vasquez    MR#:  [id number]  DATE OF BIRTH:  [birth year]  DATE OF ADMISSION:  07/18/2015  PRIMARY CARE PHYSICIAN: Park Liter, DO   REQUESTING/REFERRING PHYSICIAN:   CHIEF COMPLAINT:   Chief Complaint  Patient presents with  . Respiratory Distress    HISTORY OF PRESENT ILLNESS: Theresa Vasquez  is a 81 y.o. female with a known history of multiple medical problems including peripheral vascular disease, congestive heart failure, paroxysmal atrial fibrillation, COPD who presents to the hospital with 1 day history of cough, shortness of breath. According to the emergency room physician, Dr. Owens Shark, patient's oxygen saturations were low in the 50s and she was cyanotic initially when she came in also in respiratory distress. Her lungs sounded rhonchorous and crackly, especially on the right. Chest x-ray revealed multilobar pneumonia on the right side. Patient was initiated on antibiotic therapy, broad-spectrum and hospitalist services were contacted for admission. Patient remains on BiPAP and a review of systems difficult to obtain. Denies any fevers or chills. Denies any wheezing  PAST MEDICAL HISTORY:   Past Medical History  Diagnosis Date  . Anemia   . Depression   . Hypertension   . Colitis   . Diverticulitis   . Hypothyroidism   . Hyperlipidemia   . PVD (peripheral vascular disease) ([place]) 05/14/2015  . CHF (congestive heart failure) ([place])   . Paroxysmal atrial fibrillation ([facility])     s/p cardioversion  . Iron deficiency anemia   . Dementia   . Vascular dementia without behavioral disturbance   . Age-related osteoporosis without current pathological fracture   . Nonrheumatic mitral valve insufficiency   . Left bundle branch block   . Vascular disorder of intestine ([place])   . Hypoxemia   . Recurrent pneumonia     history of  . Personal history of peptic ulcer disease   . Gastrointestinal  hemorrhage   . Edema   . Other psychoactive substance dependence, in remission ([place])   . Diverticulosis of intestine without perforation or abscess without bleeding   . COPD (chronic obstructive pulmonary disease) ([place])   . Anxiety   . Atherosclerosis of aorta ([place])   . Barrett's esophagus without dysplasia   . Hypertensive chronic kidney disease with stage 1 through stage 4 chronic kidney disease, or unspecified chronic kidney disease   . Other specified symptoms and signs involving the circulatory and respiratory systems   . Essential hemorrhagic thrombocythemia ([place])   . Atrial fibrillation with RVR ([place])   . Pulmonary edema   . Respiratory failure with hypoxia and hypercapnia ([place])   . Type 2 diabetes mellitus with diabetic polyneuropathy ([place])   . Type 2 diabetes mellitus with diabetic chronic kidney disease ([place])   . Diabetes mellitus type II, uncontrolled ([place])   . Lactic acidosis   . High anion gap metabolic acidosis   . Osteoarthritis   . Adenomatous polyps     PAST SURGICAL HISTORY:  Past Surgical History  Procedure Laterality Date  . Abdominal hysterectomy      partial  . Colonoscopy    . Sigmoidoscopy    . Shoulder arthroscopy    . Appendectomy    . Esophagogastroduodenoscopy    . Esophagogastroduodenoscopy (egd) with propofol N/A 07/04/2015    Procedure: ESOPHAGOGASTRODUODENOSCOPY (EGD) WITH PROPOFOL;  Surgeon: Manya Silvas, MD;  Location: [facility];  Service: Endoscopy;  Laterality: N/A;    SOCIAL HISTORY:  Social History  Substance Use Topics  . Smoking status: Former Smoker -- 1.00 packs/day for 60 years    Types: Cigarettes  . Smokeless tobacco: Never Used  . Alcohol Use: No    FAMILY HISTORY:  Family History  Problem Relation Age of Onset  . Diabetes Mother   . Diabetes Father     DRUG ALLERGIES: No Known Allergies  Review of Systems  Constitutional: Negative for fever, chills, weight loss and malaise/fatigue.  HENT: Negative for  congestion.   Eyes: Negative for blurred vision and double vision.  Respiratory: Positive for cough, sputum production and shortness of breath. Negative for wheezing.   Cardiovascular: Negative for chest pain, palpitations, orthopnea, leg swelling and PND.  Gastrointestinal: Negative for nausea, vomiting, abdominal pain, diarrhea, constipation, blood in stool and melena.  Genitourinary: Negative for dysuria, urgency, frequency and hematuria.  Musculoskeletal: Negative for falls.  Skin: Negative for rash.  Neurological: Negative for dizziness and weakness.  Psychiatric/Behavioral: Negative for depression and memory loss. The patient is not nervous/anxious.     MEDICATIONS AT HOME:  Prior to Admission medications   Medication Sig Start Date End Date Taking? Authorizing Provider  albuterol (PROVENTIL) (2.5 MG/3ML) 0.083% nebulizer solution Take 2.5 mg by nebulization every 4 (four) hours as needed for wheezing or shortness of breath.    Historical Provider, MD  amiodarone (PACERONE) 200 MG tablet Take 200 mg by mouth daily.     Historical Provider, MD  Cholecalciferol (VITAMIN D-3) 1000 UNITS CAPS Take 1 capsule (1,000 Units total) by mouth daily. 07/16/15   Megan P Johnson, DO  divalproex (DEPAKOTE) 250 MG DR tablet Take 1 tablet (250 mg total) by mouth 3 (three) times daily. 05/28/15   Megan P Johnson, DO  donepezil (ARICEPT) 10 MG tablet Take 1 tablet (10 mg total) by mouth at bedtime. 05/28/15   Megan P Johnson, DO  enalapril (VASOTEC) 5 MG tablet Take 1 tablet (5 mg total) by mouth 2 (two) times daily. 05/28/15   Megan P Johnson, DO  famotidine (PEPCID) 20 MG tablet Take 20 mg by mouth 2 (two) times daily.    Historical Provider, MD  ferrous sulfate 325 (65 FE) MG tablet Take 1 tablet (325 mg total) by mouth 2 (two) times daily with a meal. 05/28/15   Megan P Johnson, DO  Fluticasone-Salmeterol (ADVAIR) 250-50 MCG/DOSE AEPB Inhale 1 puff into the lungs 2 (two) times daily. 05/28/15   Megan P  Johnson, DO  furosemide (LASIX) 40 MG tablet Take 1 tablet (40 mg total) by mouth 2 (two) times daily. 05/28/15   Megan P Johnson, DO  gabapentin (NEURONTIN) 400 MG capsule Take 1 capsule (400 mg total) by mouth 2 (two) times daily. 05/28/15   Megan P Johnson, DO  glipiZIDE (GLUCOTROL) 5 MG tablet Take 5 mg by mouth daily before breakfast.    Historical Provider, MD  glucose blood (GE100 BLOOD GLUCOSE TEST) test strip Test Blood Glucose Level Once Daily 06/03/15   Kathrine Haddock, NP  levothyroxine (SYNTHROID, LEVOTHROID) 150 MCG tablet Take 1 tablet (150 mcg total) by mouth daily before breakfast. 05/02/15   Megan P Johnson, DO  LORazepam (ATIVAN) 0.5 MG tablet Take 1 tablet (0.5 mg total) by mouth at bedtime. 05/28/15   Megan P Johnson, DO  Melatonin 3 MG TABS Take 1 tablet (3 mg total) by mouth at bedtime. 05/28/15   Megan P Johnson, DO  metoprolol (LOPRESSOR) 50 MG tablet Take 0.5 tablets (25 mg total) by mouth 2 (two) times daily. 05/28/15  Megan P Johnson, DO  pantoprazole (PROTONIX) 40 MG tablet Take 1 tablet (40 mg total) by mouth 2 (two) times daily. 05/28/15   Megan P Johnson, DO  potassium chloride SA (K-DUR,KLOR-CON) 20 MEQ tablet Take 20 mEq by mouth 2 (two) times daily.    Historical Provider, MD  tiotropium (SPIRIVA) 18 MCG inhalation capsule Place 1 capsule (18 mcg total) into inhaler and inhale daily. 05/28/15   Megan P Johnson, DO  traMADol (ULTRAM) 50 MG tablet Take 50 mg by mouth every 6 (six) hours as needed for moderate pain.     Historical Provider, MD  venlafaxine XR (EFFEXOR XR) 37.5 MG 24 hr capsule Take 1 capsule (37.5 mg total) by mouth daily with breakfast. 05/28/15   Megan P Johnson, DO      PHYSICAL EXAMINATION:   VITAL SIGNS: Blood pressure 133/60, pulse 84, temperature 97.7 F (36.5 C), temperature source Axillary, resp. rate 22, height 5\' 7"  (1.702 m), weight 93.441 kg (206 lb), SpO2 99 %.  GENERAL:  81 y.o.-year-old patient lying in the bed in moderate to severe  respiratory distress, on BiPAP.  EYES: Pupils equal, round, reactive to light and accommodation. No scleral icterus. Extraocular muscles intact.  HEENT: Head atraumatic, normocephalic. Oropharynx and nasopharynx clear.  NECK:  Supple, no jugular venous distention. No thyroid enlargement, no tenderness.  LUNGS: Some diminished breath sounds bilaterally, intermittent wheezing, right-sided rales,rhonchi and crepitations noted on auscultation. Intermittent use of accessory muscles of respiration.  CARDIOVASCULAR: S1, S2 normal. No murmurs, rubs, or gallops.  ABDOMEN: Soft, nontender, nondistended. Bowel sounds present. No organomegaly or mass.  EXTREMITIES: No pedal edema, cyanosis, or clubbing. Chronic induration NEUROLOGIC: Cranial nerves II through XII are intact. Muscle strength 5/5 in all extremities. Sensation intact. Gait not checked.  PSYCHIATRIC: The patient is alert and oriented x 3.  SKIN: No obvious rash, lesion, or ulcer.   LABORATORY PANEL:   CBC No results for input(s): WBC, HGB, HCT, PLT, MCV, MCH, MCHC, RDW, LYMPHSABS, MONOABS, EOSABS, BASOSABS, BANDABS in the last 168 hours.  Invalid input(s): NEUTRABS, BANDSABD ------------------------------------------------------------------------------------------------------------------  Chemistries  No results for input(s): NA, K, CL, CO2, GLUCOSE, BUN, CREATININE, CALCIUM, MG, AST, ALT, ALKPHOS, BILITOT in the last 168 hours.  Invalid input(s): GFRCGP ------------------------------------------------------------------------------------------------------------------  Cardiac Enzymes No results for input(s): TROPONINI in the last 168 hours. ------------------------------------------------------------------------------------------------------------------  RADIOLOGY: Dg Chest Portable 1 View  07/18/2015  CLINICAL DATA:  81 year old female with respiratory distress. Hypoxemia. History of congestive heart failure and COPD. EXAM:  PORTABLE CHEST 1 VIEW COMPARISON:  Chest x-ray 05/17/2015. FINDINGS: Extensive multifocal airspace consolidation is noted throughout the entire right lung, and at the left lung base, compatible with multilobar pneumonia. No definite pleural effusions. No evidence of pulmonary edema. Heart size appears borderline enlarged. The patient is rotated to the right on today's exam, resulting in distortion of the mediastinal contours and reduced diagnostic sensitivity and specificity for mediastinal pathology. Atherosclerosis in the thoracic aorta. IMPRESSION: 1. Findings, as above, compatible with severe multilobar pneumonia involving the entire right lung and in the left lower lobe. 2. Atherosclerosis. 3. Borderline cardiomegaly. Electronically Signed   By: Vinnie Langton M.D.   On: 07/18/2015 07:21    EKG: Orders placed or performed during the hospital encounter of 07/18/15  . ED EKG within 10 minutes  . ED EKG within 10 minutes  . EKG 12-Lead  . EKG 12-Lead   EKG in the emergency room reveals sinus rhythm at 61 bpm (bundle branch block, normal axis, nonspecific ST-T  changes  IMPRESSION AND PLAN:  Active Problems:   Community acquired pneumonia   Respiratory failure ([place])  1 . Acute respiratory failure with hypoxia and hypercapnia, admit patient to intensive care unit for BiPAP, continue oxygen therapy. Get pulmonology consultation 2. . Multilobar pneumonia, community-acquired, initiate patient on Rocephin, Zithromax and vancomycin, get sputum cultures and adjust antibiotics depending on the culture results 3. COPD exacerbation due to pneumonia. Start patient on inhalation therapy, nebulizers and steroids orally 4.   sepsis due to pneumonia , get blood cultures as well as sputum cultures. Continue antibiotic therapy, broad-spectrum following culture results   All the records are reviewed and case discussed with ED provider. Management plans discussed with the patient, family and they are in  agreement.  CODE STATUS:    Code Status Orders        Start     Ordered   07/18/15 0740  Full code   Continuous     07/18/15 0741    Advance Directive Documentation        Most Recent Value   Type of Advance Directive  Healthcare Power of Attorney, Living will   Pre-existing out of facility DNR order (yellow form or pink MOST form)     "MOST" Form in Place?         TOTAL  critical care TIME TAKING CARE OF THIS PATIENT: 70 minutes.    discussed with patient's son and informed her about hospitalization , treatment plan discussed  Jakeline Dave M.D on 07/18/2015 at 8:00 AM  Between 7am to 6pm - Pager - [phone] After 6pm go to [email or web address] - password [email or web address] [facility]  Office  [phone]  CC: Primary care physician; Park Liter, DO

## 2015-07-18 NOTE — ED Provider Notes (Signed)
[facility] Emergency Department Provider Note  ____________________________________________  Time seen: 6:45 AM  I have reviewed the triage vital signs and the nursing notes.   HISTORY  Chief Complaint Respiratory Distress     HPI Theresa Vasquez is a 81 y.o. female presents with respiratory distress of unknown duration. Per EMS on their arrival to the patient's residence O2 saturation was 58% on 2 L nasal cannula. Patient has a history of CHF and COPD.     Past Medical History  Diagnosis Date  . Anemia   . Depression   . Hypertension   . Colitis   . Diverticulitis   . Hypothyroidism   . Hyperlipidemia   . PVD (peripheral vascular disease) ([place]) 05/14/2015  . CHF (congestive heart failure) ([place])   . Paroxysmal atrial fibrillation (HCC)     s/p cardioversion  . Iron deficiency anemia   . Dementia   . Vascular dementia without behavioral disturbance   . Age-related osteoporosis without current pathological fracture   . Nonrheumatic mitral valve insufficiency   . Left bundle branch block   . Vascular disorder of intestine ([place])   . Hypoxemia   . Recurrent pneumonia     history of  . Personal history of peptic ulcer disease   . Gastrointestinal hemorrhage   . Edema   . Other psychoactive substance dependence, in remission ([place])   . Diverticulosis of intestine without perforation or abscess without bleeding   . COPD (chronic obstructive pulmonary disease) ([place])   . Anxiety   . Atherosclerosis of aorta ([place])   . Barrett's esophagus without dysplasia   . Hypertensive chronic kidney disease with stage 1 through stage 4 chronic kidney disease, or unspecified chronic kidney disease   . Other specified symptoms and signs involving the circulatory and respiratory systems   . Essential hemorrhagic thrombocythemia ([place])   . Atrial fibrillation with RVR ([place])   . Pulmonary edema   . Respiratory failure with hypoxia and hypercapnia ([place])   . Type  2 diabetes mellitus with diabetic polyneuropathy ([place])   . Type 2 diabetes mellitus with diabetic chronic kidney disease ([place])   . Diabetes mellitus type II, uncontrolled ([place])   . Lactic acidosis   . High anion gap metabolic acidosis   . Osteoarthritis   . Adenomatous polyps     Patient Active Problem List   Diagnosis Date Noted  . Paroxysmal atrial fibrillation ([place]) 05/28/2015  . Acute GI bleeding   . Severe anemia   . PVD (peripheral vascular disease) ([place]) 05/14/2015  . Type 2 DM with CKD stage 2 and hypertension ([place]) 05/14/2015  . COPD, severe ([place]) 05/14/2015  . History of pneumonia 05/14/2015  . Hypoxia 05/14/2015  . GI bleed 05/14/2015  . Benign hypertensive renal disease 05/14/2015  . Vascular dementia 05/14/2015  . Anxiety disorder 05/14/2015  . Edema 05/14/2015  . Diabetic polyneuropathy ([place]) 05/14/2015  . Diverticulosis 05/14/2015  . Abnormality of gait 05/14/2015  . Osteoporosis 05/14/2015  . Thrombocytosis ([place]) 05/14/2015  . Hypothyroidism 05/14/2015  . Barrett's esophagus 05/14/2015  . Drug addiction in remission ([place]) 05/14/2015  . Mitral regurgitation 05/14/2015  . LBBB (left bundle branch block) 05/14/2015  . Carotid bruit 05/14/2015  . Ischemic colitis ([place]) 05/14/2015  . History of peptic ulcer disease 05/14/2015  . Anemia 05/14/2015  . CHF (congestive heart failure) ([place]) 05/14/2015  . Atherosclerosis of aorta ([place]) 05/14/2015  . Iron deficiency anemia 11/24/2014    Past Surgical History  Procedure Laterality Date  .  Abdominal hysterectomy      partial  . Colonoscopy    . Sigmoidoscopy    . Shoulder arthroscopy    . Appendectomy    . Esophagogastroduodenoscopy    . Esophagogastroduodenoscopy (egd) with propofol N/A 07/04/2015    Procedure: ESOPHAGOGASTRODUODENOSCOPY (EGD) WITH PROPOFOL;  Surgeon: Manya Silvas, MD;  Location: [place];  Service: Endoscopy;  Laterality: N/A;    Current Outpatient Rx  Name  Route  Sig  Dispense   Refill  . albuterol (PROVENTIL) (2.5 MG/3ML) 0.083% nebulizer solution   Nebulization   Take 2.5 mg by nebulization every 4 (four) hours as needed for wheezing or shortness of breath.         Marland Kitchen amiodarone (PACERONE) 200 MG tablet   Oral   Take 200 mg by mouth daily.          . Cholecalciferol (VITAMIN D-3) 1000 UNITS CAPS   Oral   Take 1 capsule (1,000 Units total) by mouth daily.   60 capsule   12   . divalproex (DEPAKOTE) 250 MG DR tablet   Oral   Take 1 tablet (250 mg total) by mouth 3 (three) times daily.   90 tablet   6   . donepezil (ARICEPT) 10 MG tablet   Oral   Take 1 tablet (10 mg total) by mouth at bedtime.   30 tablet   6   . enalapril (VASOTEC) 5 MG tablet   Oral   Take 1 tablet (5 mg total) by mouth 2 (two) times daily.   60 tablet   6   . famotidine (PEPCID) 20 MG tablet   Oral   Take 20 mg by mouth 2 (two) times daily.         . ferrous sulfate 325 (65 FE) MG tablet   Oral   Take 1 tablet (325 mg total) by mouth 2 (two) times daily with a meal.   60 tablet   6   . Fluticasone-Salmeterol (ADVAIR) 250-50 MCG/DOSE AEPB   Inhalation   Inhale 1 puff into the lungs 2 (two) times daily.   60 each   6   . furosemide (LASIX) 40 MG tablet   Oral   Take 1 tablet (40 mg total) by mouth 2 (two) times daily.   60 tablet   6   . gabapentin (NEURONTIN) 400 MG capsule   Oral   Take 1 capsule (400 mg total) by mouth 2 (two) times daily.   60 capsule   6   . glipiZIDE (GLUCOTROL) 5 MG tablet   Oral   Take 5 mg by mouth daily before breakfast.         . glucose blood (GE100 BLOOD GLUCOSE TEST) test strip      Test Blood Glucose Level Once Daily   50 each   12   . levothyroxine (SYNTHROID, LEVOTHROID) 150 MCG tablet   Oral   Take 1 tablet (150 mcg total) by mouth daily before breakfast.   90 tablet   3   . LORazepam (ATIVAN) 0.5 MG tablet   Oral   Take 1 tablet (0.5 mg total) by mouth at bedtime.   30 tablet   5   . Melatonin 3 MG  TABS   Oral   Take 1 tablet (3 mg total) by mouth at bedtime.   1 tablet   12   . metoprolol (LOPRESSOR) 50 MG tablet   Oral   Take 0.5 tablets (25 mg total) by mouth 2 (  two) times daily.   30 tablet   6   . pantoprazole (PROTONIX) 40 MG tablet   Oral   Take 1 tablet (40 mg total) by mouth 2 (two) times daily.   60 tablet   6   . potassium chloride SA (K-DUR,KLOR-CON) 20 MEQ tablet   Oral   Take 20 mEq by mouth 2 (two) times daily.         Marland Kitchen tiotropium (SPIRIVA) 18 MCG inhalation capsule   Inhalation   Place 1 capsule (18 mcg total) into inhaler and inhale daily.   30 capsule   6   . traMADol (ULTRAM) 50 MG tablet   Oral   Take 50 mg by mouth every 6 (six) hours as needed for moderate pain.          Marland Kitchen venlafaxine XR (EFFEXOR XR) 37.5 MG 24 hr capsule   Oral   Take 1 capsule (37.5 mg total) by mouth daily with breakfast.   30 capsule   6     Allergies No known drug allergies  Family History  Problem Relation Age of Onset  . Diabetes Mother   . Diabetes Father     Social History Social History  Substance Use Topics  . Smoking status: Former Smoker -- 1.00 packs/day for 60 years    Types: Cigarettes  . Smokeless tobacco: Never Used  . Alcohol Use: No    Review of Systems  Constitutional: Negative for fever. Eyes: Negative for visual changes. ENT: Negative for sore throat. Cardiovascular: Negative for chest pain. Respiratory: Positive for shortness of breath. Gastrointestinal: Negative for abdominal pain, vomiting and diarrhea. Genitourinary: Negative for dysuria. Musculoskeletal: Negative for back pain. Skin: Negative for rash. Neurological: Negative for headaches, focal weakness or numbness.   10-point ROS otherwise negative.  ____________________________________________   PHYSICAL EXAM:  VITAL SIGNS: ED Triage Vitals  Enc Vitals Group     BP --      Pulse --      Resp --      Temp --      Temp src --      SpO2 07/18/15 0647 55  %     Weight --      Height --      Head Cir --      Peak Flow --      Pain Score --      Pain Loc --      Pain Edu? --      Excl. in [place]? --     Constitutional: Alert and oriented. Well appearing and in no distress. Eyes: Conjunctivae are normal. PERRL. Normal extraocular movements. ENT   Head: Normocephalic and atraumatic.   Nose: No congestion/rhinnorhea.   Mouth/Throat: Mucous membranes are moist.   Neck: No stridor. Hematological/Lymphatic/Immunilogical: No cervical lymphadenopathy. Cardiovascular: Normal rate, regular rhythm. Normal and symmetric distal pulses are present in all extremities. No murmurs, rubs, or gallops. Respiratory: Tachypnea, accessory muscle use, diffuse rhonchi rales bibasilar  Gastrointestinal: Soft and nontender. No distention. There is no CVA tenderness. Genitourinary: deferred Musculoskeletal: Nontender with normal range of motion in all extremities. No joint effusions.  No lower extremity tenderness nor edema. Neurologic:  Normal speech and language. No gross focal neurologic deficits are appreciated. Speech is normal.  Skin:  Skin is warm, dry and intact. No rash noted. Psychiatric: Mood and affect are normal. Speech and behavior are normal. Patient exhibits appropriate insight and judgment.  ____________________________________________    LABS (pertinent positives/negatives)  Labs Reviewed  BLOOD GAS, ARTERIAL - Abnormal; Notable for the following:    pH, Arterial 7.33 (*)    pCO2 arterial 51 (*)    pO2, Arterial 138 (*)    Allens test (pass/fail) YEAST (*)    All other components within normal limits  CULTURE, BLOOD (ROUTINE X 2)  CULTURE, BLOOD (ROUTINE X 2)  BASIC METABOLIC PANEL  CBC  TROPONIN I     ____________________________________________   EKG  ED ECG REPORT I, BROWN, [place] N, the attending physician, personally viewed and interpreted this ECG.   Date: 07/18/2015  EKG Time: 6:43 AM  Rate: 93  Rhythm:  Normal sinus rhythm with left bundle-branch block  Axis: Left Axis deviation  Intervals: Normal  ST&T Change:    ____________________________________________    RADIOLOGY  DG Chest Portable 1 View (Final result) Result time: 07/18/15 [id number]   Final result by Rad Results In Interface (07/18/15 [id number])   Narrative:   CLINICAL DATA: 81 year old female with respiratory distress. Hypoxemia. History of congestive heart failure and COPD.  EXAM: PORTABLE CHEST 1 VIEW  COMPARISON: Chest x-ray 05/17/2015.  FINDINGS: Extensive multifocal airspace consolidation is noted throughout the entire right lung, and at the left lung base, compatible with multilobar pneumonia. No definite pleural effusions. No evidence of pulmonary edema. Heart size appears borderline enlarged. The patient is rotated to the right on today's exam, resulting in distortion of the mediastinal contours and reduced diagnostic sensitivity and specificity for mediastinal pathology. Atherosclerosis in the thoracic aorta.  IMPRESSION: 1. Findings, as above, compatible with severe multilobar pneumonia involving the entire right lung and in the left lower lobe. 2. Atherosclerosis. 3. Borderline cardiomegaly.   Electronically Signed By: Vinnie Langton M.D. On: 07/18/2015 07:21         ____________________________________________     Critical Care performed: CRITICAL CARE Performed by: Marjean Donna N   Total critical care time: 60 minutes  Critical care time was exclusive of separately billable procedures and treating other patients.  Critical care was necessary to treat or prevent imminent or life-threatening deterioration.  Critical care was time spent personally by me on the following activities: development of treatment plan with patient and/or surrogate as well as nursing, discussions with consultants, evaluation of patient's response to treatment, examination of patient, obtaining  history from patient or surrogate, ordering and performing treatments and interventions, ordering and review of laboratory studies, ordering and review of radiographic studies, pulse oximetry and re-evaluation of patient's condition.     ____________________________________________   INITIAL IMPRESSION / ASSESSMENT AND PLAN / ED COURSE  Pertinent labs & imaging results that were available during my care of the patient were reviewed by me and considered in my medical decision making (see chart for details).  DuoNeb 3 given via BiPAP as well as Solu-Medrol 125 mg IV. Patient noted to be hypertensive on presentation emergency department systolic blood pressure [id number] such half inch Nitropaste was applied Chest x-ray consistent with multilobar pneumonia patient received IV vancomycin and Zosyn in the emergency department ____________________________________________   FINAL CLINICAL IMPRESSION(S) / ED DIAGNOSES  Final diagnoses:  None      Gregor Hams, MD 07/18/15 [phone]

## 2015-07-18 NOTE — Progress Notes (Signed)
ANTIBIOTIC CONSULT NOTE - INITIAL  Pharmacy Consult for Vancomycin dosing; also renal dose Azithromycin and Ceftriaxone. Indication: CAP  No Known Allergies  Patient Measurements: Height: 5\' 7"  (170.2 cm) Weight: 206 lb (93.441 kg) IBW/kg (Calculated) : 61.6 Adjusted Body Weight: 74.3 kg  Vital Signs: Temp: 97.7 F (36.5 C) (12/29 0650) Temp Source: Axillary (12/29 0650) BP: 134/67 mmHg (12/29 0800) Pulse Rate: 80 (12/29 0800) Intake/Output from previous day:   Intake/Output from this shift:    Labs:  Recent Labs  07/18/15 0646  WBC 12.4*  HGB 12.9  PLT 394  CREATININE 0.74   Estimated Creatinine Clearance: 63.6 mL/min (by C-G formula based on Cr of 0.74). No results for input(s): VANCOTROUGH, VANCOPEAK, VANCORANDOM, GENTTROUGH, GENTPEAK, GENTRANDOM, TOBRATROUGH, TOBRAPEAK, TOBRARND, AMIKACINPEAK, AMIKACINTROU, AMIKACIN in the last 72 hours.   Microbiology: No results found for this or any previous visit (from the past 720 hour(s)).  Medical History: Past Medical History  Diagnosis Date  . Anemia   . Depression   . Hypertension   . Colitis   . Diverticulitis   . Hypothyroidism   . Hyperlipidemia   . PVD (peripheral vascular disease) ([place]) 05/14/2015  . CHF (congestive heart failure) ([place])   . Paroxysmal atrial fibrillation (HCC)     s/p cardioversion  . Iron deficiency anemia   . Dementia   . Vascular dementia without behavioral disturbance   . Age-related osteoporosis without current pathological fracture   . Nonrheumatic mitral valve insufficiency   . Left bundle branch block   . Vascular disorder of intestine ([place])   . Hypoxemia   . Recurrent pneumonia     history of  . Personal history of peptic ulcer disease   . Gastrointestinal hemorrhage   . Edema   . Other psychoactive substance dependence, in remission ([place])   . Diverticulosis of intestine without perforation or abscess without bleeding   . COPD (chronic obstructive pulmonary disease)  ([place])   . Anxiety   . Atherosclerosis of aorta ([place])   . Barrett's esophagus without dysplasia   . Hypertensive chronic kidney disease with stage 1 through stage 4 chronic kidney disease, or unspecified chronic kidney disease   . Other specified symptoms and signs involving the circulatory and respiratory systems   . Essential hemorrhagic thrombocythemia ([place])   . Atrial fibrillation with RVR ([place])   . Pulmonary edema   . Respiratory failure with hypoxia and hypercapnia ([place])   . Type 2 diabetes mellitus with diabetic polyneuropathy ([place])   . Type 2 diabetes mellitus with diabetic chronic kidney disease ([place])   . Diabetes mellitus type II, uncontrolled ([place])   . Lactic acidosis   . High anion gap metabolic acidosis   . Osteoarthritis   . Adenomatous polyps     Medications:  Anti-infectives    Start     Dose/Rate Route Frequency Ordered Stop   07/18/15 0745  cefTRIAXone (ROCEPHIN) 1 g in dextrose 5 % 50 mL IVPB     1 g 100 mL/hr over 30 Minutes Intravenous Every 24 hours 07/18/15 0741 07/25/15 0744   07/18/15 0745  azithromycin (ZITHROMAX) 500 mg in dextrose 5 % 250 mL IVPB     500 mg 250 mL/hr over 60 Minutes Intravenous Every 24 hours 07/18/15 0741 07/25/15 0744   07/18/15 0715  vancomycin (VANCOCIN) IVPB 1000 mg/200 mL premix     1,000 mg 200 mL/hr over 60 Minutes Intravenous  Once 07/18/15 0707     07/18/15 0715  piperacillin-tazobactam (ZOSYN) IVPB 3.375 g  3.375 g 12.5 mL/hr over 240 Minutes Intravenous  Once 07/18/15 [id number] 07/18/15 0834     Assessment: 81 yo female admitted with CAP multilobar on right side.  Started on Rocephin, Zithromycin and Vancomycin.  Sputum and blood cultures ordered.  Goal of Therapy:  Vancomycin trough level 15-20 mcg/ml  Plan:  Ke= 0.046; Vd = 52.01L; CrCl = 49.6 ml/min Given 1 gm Vancomycin in ED.  Will give stacked dose 1gm 6 hrs after first dose and continue 1 gm q 18hr from there. Will Follow up culture results.  Vancomycin trough  to be checked before 5th dose on 12/31 @ 1930.  Will continue ordered doses of Ceftriaxone 1gm q24hr and Azithromycin 500mg  q24 hrs.  Bless Lisenby K 07/18/2015,8:45 AM

## 2015-07-18 NOTE — Consult Note (Signed)
OK to transfer to gen med floor, significant improvement made throughout the day. Call with any questions   Corrin Parker, M.D.  [facility]  Medical Director [place] Director [facility] Cardio-Pulmonary Department

## 2015-07-18 NOTE — Care Management (Addendum)
Patient admitted to icu due to pneumonia and exacerbation of copd caused by the pneumonia.   She is currently requiring continuous bipap. She has received services from [facility] in the past.  Reached out to agency to determine if patient was still open to services.  she does not have home 02.  Patient  is not able to converse with CM speak due to the need for bipap and unable to tolerate conversation

## 2015-07-18 NOTE — Progress Notes (Signed)

## 2015-07-18 NOTE — Plan of Care (Signed)
Problem: Respiratory: Goal: Ability to maintain a clear airway will improve Outcome: Progressing titrating down on FiO2 now at 50%

## 2015-07-18 NOTE — Telephone Encounter (Signed)
Patients care giver called and stated that patient is out of thyroid medication, I checked in patients chart, patient should have some medications on hold at the pharmacy, notified caregiver to check with the pharmacy.

## 2015-07-18 NOTE — ED Notes (Signed)
Adm md with pt 

## 2015-07-18 NOTE — ED Notes (Signed)
Pt son reports pt is on oxygen as needed. Pt was placed on oxygen when caregiver noticed pt having difficulty breathing.

## 2015-07-18 NOTE — Consult Note (Signed)
[facility] Consultation      Name: Theresa Vasquez MRN: [id number] DOB: 1930-08-30    ADMISSION DATE:  07/18/2015   REFERRING MD :  vaickute   CHIEF COMPLAINT:  Acute resp failure   HISTORY OF PRESENT ILLNESS  81 y.o. female with a known history of multiple medical problems including peripheral vascular disease, congestive heart failure, paroxysmal atrial fibrillation, COPD who presents to the hospital with 1 day history of cough, shortness of breath.    patient's oxygen saturations were low in the 50s and she was cyanotic initially when she came in also in respiratory distress.  Her lungs sounded rhonchorous and crackly, especially on the right per ER reports.  CXR 12/29 images reviewed 07/18/2015 Chest x-ray revealed multilobar pneumonia..    Patient remains on BiPAP and a review of systems difficult to obtain. Denies any fevers or chills. Denies any wheezing fio2 at 50%  Patient at high risk for intubation       PAST MEDICAL HISTORY    :  Past Medical History  Diagnosis Date  . Anemia   . Depression   . Hypertension   . Colitis   . Diverticulitis   . Hypothyroidism   . Hyperlipidemia   . PVD (peripheral vascular disease) ([place]) 05/14/2015  . CHF (congestive heart failure) ([place])   . Paroxysmal atrial fibrillation (HCC)     s/p cardioversion  . Iron deficiency anemia   . Dementia   . Vascular dementia without behavioral disturbance   . Age-related osteoporosis without current pathological fracture   . Nonrheumatic mitral valve insufficiency   . Left bundle branch block   . Vascular disorder of intestine ([place])   . Hypoxemia   . Recurrent pneumonia     history of  . Personal history of peptic ulcer disease   . Gastrointestinal hemorrhage   . Edema   . Other psychoactive substance dependence, in remission ([place])   . Diverticulosis of intestine without perforation or abscess without bleeding   . COPD (chronic obstructive pulmonary  disease) ([place])   . Anxiety   . Atherosclerosis of aorta ([place])   . Barrett's esophagus without dysplasia   . Hypertensive chronic kidney disease with stage 1 through stage 4 chronic kidney disease, or unspecified chronic kidney disease   . Other specified symptoms and signs involving the circulatory and respiratory systems   . Essential hemorrhagic thrombocythemia ([place])   . Atrial fibrillation with RVR ([place])   . Pulmonary edema   . Respiratory failure with hypoxia and hypercapnia ([place])   . Type 2 diabetes mellitus with diabetic polyneuropathy ([place])   . Type 2 diabetes mellitus with diabetic chronic kidney disease ([place])   . Diabetes mellitus type II, uncontrolled ([place])   . Lactic acidosis   . High anion gap metabolic acidosis   . Osteoarthritis   . Adenomatous polyps    Past Surgical History  Procedure Laterality Date  . Abdominal hysterectomy      partial  . Colonoscopy    . Sigmoidoscopy    . Shoulder arthroscopy    . Appendectomy    . Esophagogastroduodenoscopy    . Esophagogastroduodenoscopy (egd) with propofol N/A 07/04/2015    Procedure: ESOPHAGOGASTRODUODENOSCOPY (EGD) WITH PROPOFOL;  Surgeon: Manya Silvas, MD;  Location: [facility];  Service: Endoscopy;  Laterality: N/A;   Prior to Admission medications   Medication Sig Start Date End Date Taking? Authorizing Provider  acetaminophen (TYLENOL) 500 MG tablet Take 500 mg by mouth every 6 (six)  hours as needed.   Yes Historical Provider, MD  albuterol (PROVENTIL) (2.5 MG/3ML) 0.083% nebulizer solution Take 2.5 mg by nebulization every 4 (four) hours as needed for wheezing or shortness of breath.   Yes Historical Provider, MD  amiodarone (PACERONE) 200 MG tablet Take 200 mg by mouth daily.    Yes Historical Provider, MD  Cholecalciferol (VITAMIN D-3) 1000 UNITS CAPS Take 1 capsule (1,000 Units total) by mouth daily. 07/16/15  Yes Megan P Johnson, DO  divalproex (DEPAKOTE) 250 MG DR tablet Take 1 tablet (250 mg total) by  mouth 3 (three) times daily. 05/28/15  Yes Megan P Johnson, DO  donepezil (ARICEPT) 10 MG tablet Take 1 tablet (10 mg total) by mouth at bedtime. 05/28/15  Yes Megan P Johnson, DO  enalapril (VASOTEC) 5 MG tablet Take 1 tablet (5 mg total) by mouth 2 (two) times daily. 05/28/15  Yes Megan P Johnson, DO  ferrous sulfate 325 (65 FE) MG tablet Take 1 tablet (325 mg total) by mouth 2 (two) times daily with a meal. 05/28/15  Yes Megan P Johnson, DO  Fluticasone-Salmeterol (ADVAIR) 250-50 MCG/DOSE AEPB Inhale 1 puff into the lungs 2 (two) times daily. 05/28/15  Yes Megan P Johnson, DO  furosemide (LASIX) 40 MG tablet Take 1 tablet (40 mg total) by mouth 2 (two) times daily. 05/28/15  Yes Megan P Johnson, DO  gabapentin (NEURONTIN) 400 MG capsule Take 1 capsule (400 mg total) by mouth 2 (two) times daily. 05/28/15  Yes Megan P Johnson, DO  levothyroxine (SYNTHROID, LEVOTHROID) 150 MCG tablet Take 1 tablet (150 mcg total) by mouth daily before breakfast. 05/02/15  Yes Megan P Johnson, DO  LORazepam (ATIVAN) 0.5 MG tablet Take 1 tablet (0.5 mg total) by mouth at bedtime. 05/28/15  Yes Megan P Johnson, DO  Melatonin 3 MG TABS Take 1 tablet (3 mg total) by mouth at bedtime. 05/28/15  Yes Megan P Johnson, DO  metoprolol (LOPRESSOR) 50 MG tablet Take 0.5 tablets (25 mg total) by mouth 2 (two) times daily. 05/28/15  Yes Megan P Johnson, DO  pantoprazole (PROTONIX) 40 MG tablet Take 1 tablet (40 mg total) by mouth 2 (two) times daily. 05/28/15  Yes Megan P Johnson, DO  tiotropium (SPIRIVA) 18 MCG inhalation capsule Place 1 capsule (18 mcg total) into inhaler and inhale daily. 05/28/15  Yes Megan P Johnson, DO  venlafaxine XR (EFFEXOR XR) 37.5 MG 24 hr capsule Take 1 capsule (37.5 mg total) by mouth daily with breakfast. 05/28/15  Yes Megan P Johnson, DO   No Known Allergies   FAMILY HISTORY   Family History  Problem Relation Age of Onset  . Diabetes Mother   . Diabetes Father       SOCIAL HISTORY    reports that  she has quit smoking. Her smoking use included Cigarettes. She has a 60 pack-year smoking history. She has never used smokeless tobacco. She reports that she does not drink alcohol or use illicit drugs.  Review of Systems  Unable to perform ROS: critical illness      VITAL SIGNS    Temp:  [97.7 F (36.5 C)] 97.7 F (36.5 C) (12/29 0650) Pulse Rate:  [78-86] 80 (12/29 0800) Resp:  [17-28] 22 (12/29 0800) BP: (133-190)/(60-84) 134/67 mmHg (12/29 0800) SpO2:  [55 %-100 %] 95 % (12/29 0800) FiO2 (%):  [50 %-60 %] 50 % (12/29 0744) Weight:  [206 lb (93.441 kg)] 206 lb (93.441 kg) (12/29 0650) HEMODYNAMICS:   VENTILATOR SETTINGS: Vent Mode:  [-]  FiO2 (%):  [50 %-60 %] 50 % INTAKE / OUTPUT: No intake or output data in the 24 hours ending 07/18/15 0905     PHYSICAL EXAM   Physical Exam  Constitutional: She appears distressed.  HENT:  Head: Normocephalic and atraumatic.  Eyes: Pupils are equal, round, and reactive to light. No scleral icterus.  Neck: Normal range of motion. Neck supple.  Cardiovascular: Normal rate and regular rhythm.   No murmur heard. Pulmonary/Chest: She is in respiratory distress. She has no wheezes. She has rales.  resp distress  Abdominal: Soft. She exhibits no distension. There is no tenderness.  Musculoskeletal: She exhibits no edema.  Neurological: She displays normal reflexes. Coordination normal.  gcs<8T  Skin: Skin is warm. No rash noted. She is diaphoretic.       LABS   LABS:  CBC  Recent Labs Lab 07/18/15 0646  WBC 12.4*  HGB 12.9  HCT 40.6  PLT 394   Coag's No results for input(s): APTT, INR in the last 168 hours. BMET  Recent Labs Lab 07/18/15 0646  CREATININE TEST WILL BE CREDITED   Electrolytes No results for input(s): CALCIUM, MG, PHOS in the last 168 hours. Sepsis Markers No results for input(s): LATICACIDVEN, PROCALCITON, O2SATVEN in the last 168 hours. ABG  Recent Labs Lab 07/18/15 0659  PHART 7.33*    PCO2ART 51*  PO2ART 138*   Liver Enzymes No results for input(s): AST, ALT, ALKPHOS, BILITOT, ALBUMIN in the last 168 hours. Cardiac Enzymes No results for input(s): TROPONINI, PROBNP in the last 168 hours. Glucose No results for input(s): GLUCAP in the last 168 hours.   No results found for this or any previous visit (from the past 240 hour(s)).   Current facility-administered medications:  .  azithromycin (ZITHROMAX) 500 mg in dextrose 5 % 250 mL IVPB, 500 mg, Intravenous, Q24H, Rima Vaickute, MD .  budesonide (PULMICORT) nebulizer solution 0.25 mg, 0.25 mg, Nebulization, BID, Theodoro Grist, MD .  cefTRIAXone (ROCEPHIN) 1 g in dextrose 5 % 50 mL IVPB, 1 g, Intravenous, Q24H, Theodoro Grist, MD, Last Rate: 100 mL/hr at 07/18/15 0835, 1 g at 07/18/15 0835 .  heparin injection 5,000 Units, 5,000 Units, Subcutaneous, 3 times per day, Theodoro Grist, MD .  ipratropium-albuterol (DUONEB) 0.5-2.5 (3) MG/3ML nebulizer solution 3 mL, 3 mL, Nebulization, Q4H, Rima Vaickute, MD .  predniSONE (DELTASONE) tablet 50 mg, 50 mg, Oral, Q breakfast, Theodoro Grist, MD  IMAGING    Dg Chest Portable 1 View  07/18/2015  CLINICAL DATA:  81 year old female with respiratory distress. Hypoxemia. History of congestive heart failure and COPD. EXAM: PORTABLE CHEST 1 VIEW COMPARISON:  Chest x-ray 05/17/2015. FINDINGS: Extensive multifocal airspace consolidation is noted throughout the entire right lung, and at the left lung base, compatible with multilobar pneumonia. No definite pleural effusions. No evidence of pulmonary edema. Heart size appears borderline enlarged. The patient is rotated to the right on today's exam, resulting in distortion of the mediastinal contours and reduced diagnostic sensitivity and specificity for mediastinal pathology. Atherosclerosis in the thoracic aorta. IMPRESSION: 1. Findings, as above, compatible with severe multilobar pneumonia involving the entire right lung and in the left lower  lobe. 2. Atherosclerosis. 3. Borderline cardiomegaly. Electronically Signed   By: Vinnie Langton M.D.   On: 07/18/2015 07:21      Indwelling Urinary Catheter continued, requirement due to   Reason to continue Indwelling Urinary Catheter for strict Intake/Output monitoring for hemodynamic instability  MAJOR EVENTS/TEST RESULTS:CXR multifocal opacities   INDWELLING DEVICES::  MICRO DATA: MRSA PCR  Urine  Blood Resp   ANTIMICROBIALS:  Vanc/Zosyn 12/29 one dode Rocephin 12/29>> Azithromycin 12/29>>    ASSESSMENT/PLAN   81 yo white female with acute resp failure from acute COPD exacerbation and multifocal pneumonia on biPAP  PULMONARY 1.Respiratory Failure -continue Bronchodilator Therapy -high risk for intubation -wean fio2 as tolerated -started iv steroids   CARDIOVASCULAR Cardiac monitoring  RENAL Follow UO, foley if needed Follow chem 7  GASTROINTESTINAL Keep npo for now  HEMATOLOGIC Follow CBC  INFECTIOUS Empiric abx therapy for CAP  ENDOCRINE - ICU hypoglycemic\Hyperglycemia protocol   NEUROLOGIC Avoid heavy sedatives    I have personally obtained a history, examined the patient, evaluated laboratory and independently reviewed  imaging results, formulated the assessment and plan and placed orders.  The Patient requires high complexity decision making for assessment and support, frequent evaluation and titration of therapies, application of advanced monitoring technologies and extensive interpretation of multiple databases. Critical Care Time devoted to patient care services described in this note is 55 minutes.   Overall, patient is critically ill, prognosis is guarded. Patient at high risk for cardiac arrest and death.    Corrin Parker, M.D.  [facility]  Medical Director [place] Director [facility] Cardio-Pulmonary Department

## 2015-07-18 NOTE — ED Notes (Signed)
Lab was called to check status on am labs, they will start processing now

## 2015-07-18 NOTE — Progress Notes (Signed)
Pt received to room 117 from ccu, oriented to room , bed alarm on for safety

## 2015-07-19 ENCOUNTER — Inpatient Hospital Stay: Payer: Medicare Other

## 2015-07-19 DIAGNOSIS — F0391 Unspecified dementia with behavioral disturbance: Secondary | ICD-10-CM

## 2015-07-19 DIAGNOSIS — A419 Sepsis, unspecified organism: Secondary | ICD-10-CM

## 2015-07-19 DIAGNOSIS — F03918 Unspecified dementia, unspecified severity, with other behavioral disturbance: Secondary | ICD-10-CM

## 2015-07-19 LAB — CBC
HEMATOCRIT: 35.5 % (ref 35.0–47.0)
Hemoglobin: 11.2 g/dL — ABNORMAL LOW (ref 12.0–16.0)
MCH: 28.5 pg (ref 26.0–34.0)
MCHC: 31.5 g/dL — AB (ref 32.0–36.0)
MCV: 90.5 fL (ref 80.0–100.0)
PLATELETS: 370 10*3/uL (ref 150–440)
RBC: 3.93 MIL/uL (ref 3.80–5.20)
RDW: 16.8 % — AB (ref 11.5–14.5)
WBC: 16.4 10*3/uL — AB (ref 3.6–11.0)

## 2015-07-19 LAB — BASIC METABOLIC PANEL
Anion gap: 10 (ref 5–15)
BUN: 23 mg/dL — AB (ref 6–20)
CALCIUM: 8.5 mg/dL — AB (ref 8.9–10.3)
CO2: 28 mmol/L (ref 22–32)
CREATININE: 0.73 mg/dL (ref 0.44–1.00)
Chloride: 98 mmol/L — ABNORMAL LOW (ref 101–111)
GFR calc Af Amer: >60 mL/min (ref >60)
GLUCOSE: 274 mg/dL — AB (ref 65–99)
Potassium: 4.4 mmol/L (ref 3.5–5.1)
SODIUM: 136 mmol/L (ref 135–145)

## 2015-07-19 LAB — GLUCOSE, CAPILLARY
GLUCOSE-CAPILLARY: 233 mg/dL — AB (ref 65–99)
GLUCOSE-CAPILLARY: 220 mg/dL — AB (ref 65–99)
Glucose-Capillary: 235 mg/dL — ABNORMAL HIGH (ref 65–99)
Glucose-Capillary: 307 mg/dL — ABNORMAL HIGH (ref 65–99)
Glucose-Capillary: 202 mg/dL — ABNORMAL HIGH (ref 65–99)
Glucose-Capillary: 219 mg/dL — ABNORMAL HIGH (ref 65–99)

## 2015-07-19 LAB — STREP PNEUMONIAE URINARY ANTIGEN: Strep Pneumo Urinary Antigen: NEGATIVE

## 2015-07-19 LAB — HIV ANTIBODY (ROUTINE TESTING W REFLEX): HIV SCREEN 4TH GENERATION: NONREACTIVE

## 2015-07-19 MED ORDER — CETYLPYRIDINIUM CHLORIDE 0.05 % MT LIQD: 7.0000 mL | Freq: Two times a day (BID) | OROMUCOSAL | Status: DC

## 2015-07-19 MED ORDER — HALOPERIDOL 0.5 MG PO TABS: 0.5000 mg | ORAL_TABLET | Freq: Four times a day (QID) | ORAL | Status: DC | PRN

## 2015-07-19 MED ORDER — ENALAPRIL MALEATE 5 MG PO TABS
10.0000 mg | ORAL_TABLET | Freq: Two times a day (BID) | ORAL | Status: DC
  Administered 2015-07-19 – 2015-07-21 (×4): 10 mg via ORAL
  Filled 2015-07-19 (×4): qty 2

## 2015-07-19 MED ORDER — AZITHROMYCIN 250 MG PO TABS
500.0000 mg | ORAL_TABLET | Freq: Every day | ORAL | Status: DC
  Administered 2015-07-20 – 2015-07-22 (×3): 500 mg via ORAL
  Filled 2015-07-19 (×3): qty 2

## 2015-07-19 MED ORDER — PREDNISONE 10 MG (21) PO TBPK: 10.0000 mg | ORAL_TABLET | Freq: Every day | ORAL | Status: DC

## 2015-07-19 MED ORDER — IPRATROPIUM-ALBUTEROL 0.5-2.5 (3) MG/3ML IN SOLN: 3.0000 mL | RESPIRATORY_TRACT | Status: DC

## 2015-07-19 MED ORDER — HALOPERIDOL LACTATE 5 MG/ML IJ SOLN
5.0000 mg | Freq: Once | INTRAMUSCULAR | Status: AC
  Administered 2015-07-19: 5 mg via INTRAVENOUS
  Filled 2015-07-19: qty 1

## 2015-07-19 MED ORDER — HALOPERIDOL LACTATE 5 MG/ML IJ SOLN
1.0000 mg | Freq: Four times a day (QID) | INTRAMUSCULAR | Status: DC | PRN
  Administered 2015-07-19 – 2015-07-22 (×3): 1 mg via INTRAVENOUS
  Filled 2015-07-19 (×3): qty 1

## 2015-07-19 MED ORDER — DOXYCYCLINE HYCLATE 100 MG PO CAPS: 100.0000 mg | ORAL_CAPSULE | Freq: Two times a day (BID) | ORAL | Status: DC

## 2015-07-19 NOTE — Clinical Social Work Note (Signed)
Clinical Social Worker was consulted "living with ex and home O2 PRN." CSW discussed pt with RNCM. Per MD, pt will be returning home with home health PT. RNCM is following for discharge planning needs. CSW is signing off as no further needs identified.   Darden Dates, MSW, LCSW Clinical Social Worker  [phone]

## 2015-07-19 NOTE — Progress Notes (Signed)
Dr Ether Griffins made aware that pt o2 sat decreased to 80% on room air with exertion, pt will not discharge today, also aware that chest xray result are in states that she will review result, no further orders

## 2015-07-19 NOTE — Progress Notes (Signed)
[facility] [place] NAME: Theresa Vasquez    MR#:  [id number]  DATE OF BIRTH:  07-18-1931  SUBJECTIVE:  CHIEF COMPLAINT:   Chief Complaint  Patient presents with  . Respiratory Distress   the patient is a 81 year old Caucasian female who presented to the hospital with complaints of severe shortness of breath. Initial chest x-ray was concerning for possible pneumonia on the right, so she was admitted for broad-spectrum antibiotic therapy. Apart from antibiotics. She was initiated on diuretic, she diuresed approximately 550 cc since admission and her condition overall improved. She is off oxygen and on the room air at present with O2 sats of 90%. Denies any discomfort or cough  Review of Systems  Unable to perform ROS: mental status change    VITAL SIGNS: Blood pressure 158/89, pulse 78, temperature 98.5 F (36.9 C), temperature source Oral, resp. rate 20, height 5\' 7"  (1.702 m), weight 93.441 kg (206 lb), SpO2 95 %.  PHYSICAL EXAMINATION:   GENERAL:  81 y.o.-year-old patient lying in the bed with no acute distress.  EYES: Pupils equal, round, reactive to light and accommodation. No scleral icterus. Extraocular muscles intact.  HEENT: Head atraumatic, normocephalic. Oropharynx and nasopharynx clear.  NECK:  Supple, no jugular venous distention. No thyroid enlargement, no tenderness.  LUNGS: Diminished breath sounds on the right with diffuse crackles, no wheezing, rales,rhonchi or crepitation on the left. Intermittent use of accessory muscles of respiration.  CARDIOVASCULAR: S1, S2 normal. No murmurs, rubs, or gallops.  ABDOMEN: Soft, nontender, nondistended. Bowel sounds present. No organomegaly or mass.  EXTREMITIES: No pedal edema, cyanosis, or clubbing.  NEUROLOGIC: Cranial nerves II through XII are intact. Muscle strength 5/5 in all extremities. Sensation intact. Gait not checked.  PSYCHIATRIC: The patient is alert and oriented x  3.  SKIN: No obvious rash, lesion, or ulcer.   ORDERS/RESULTS REVIEWED:   CBC  Recent Labs Lab 07/18/15 0646 07/19/15 0725  WBC 12.4* 16.4*  HGB 12.9 11.2*  HCT 40.6 35.5  PLT 394 370  MCV 91.5 90.5  MCH 29.0 28.5  MCHC 31.7* 31.5*  RDW 16.7* 16.8*   ------------------------------------------------------------------------------------------------------------------  Chemistries   Recent Labs Lab 07/18/15 0646 07/19/15 0725  NA 137 136  K 3.6 4.4  CL 99* 98*  CO2 27 28  GLUCOSE 320* 274*  BUN 12 23*  CREATININE 0.71  TEST WILL BE CREDITED 0.73  CALCIUM 8.1* 8.5*   ------------------------------------------------------------------------------------------------------------------ estimated creatinine clearance is 63.6 mL/min (by C-G formula based on Cr of 0.73). ------------------------------------------------------------------------------------------------------------------ No results for input(s): TSH, T4TOTAL, T3FREE, THYROIDAB in the last 72 hours.  Invalid input(s): FREET3  Cardiac Enzymes  Recent Labs Lab 07/18/15 0646  TROPONINI 0.03   ------------------------------------------------------------------------------------------------------------------ Invalid input(s): POCBNP ---------------------------------------------------------------------------------------------------------------  RADIOLOGY: Dg Chest 2 View  07/19/2015  CLINICAL DATA:  Pneumonia. EXAM: CHEST  2 VIEW COMPARISON:  July 18, 2015. FINDINGS: Stable cardiomediastinal silhouette. No pneumothorax is noted. Mild bilateral pleural effusions are noted. Right lung opacity is improved compared to prior exam. However mild interstitial densities are noted throughout both lungs which may represent scarring, but some degree of interstitial edema or inflammation cannot be excluded. The visualized skeletal structures are unremarkable. IMPRESSION: Significantly decreased right lung opacity is noted  consistent with improving pneumonia. Mild bilateral pleural effusions are noted. Stable mild interstitial densities are noted throughout both lungs which may represent scarring, but superimposed interstitial inflammation or edema cannot be excluded. Electronically Signed   By: Marijo Conception,  M.D.   On: 07/19/2015 11:30   Dg Chest Portable 1 View  07/18/2015  CLINICAL DATA:  81 year old female with respiratory distress. Hypoxemia. History of congestive heart failure and COPD. EXAM: PORTABLE CHEST 1 VIEW COMPARISON:  Chest x-ray 05/17/2015. FINDINGS: Extensive multifocal airspace consolidation is noted throughout the entire right lung, and at the left lung base, compatible with multilobar pneumonia. No definite pleural effusions. No evidence of pulmonary edema. Heart size appears borderline enlarged. The patient is rotated to the right on today's exam, resulting in distortion of the mediastinal contours and reduced diagnostic sensitivity and specificity for mediastinal pathology. Atherosclerosis in the thoracic aorta. IMPRESSION: 1. Findings, as above, compatible with severe multilobar pneumonia involving the entire right lung and in the left lower lobe. 2. Atherosclerosis. 3. Borderline cardiomegaly. Electronically Signed   By: Vinnie Langton M.D.   On: 07/18/2015 07:21    EKG:  Orders placed or performed during the hospital encounter of 07/18/15  . ED EKG within 10 minutes  . ED EKG within 10 minutes  . EKG 12-Lead  . EKG 12-Lead    ASSESSMENT AND PLAN:  Principal Problem:   Respiratory failure (HCC) Active Problems:   Community acquired pneumonia   Pneumonia   COPD exacerbation ([place])   Sepsis ([place])   Dementia with behavioral disturbance  #1 acute respiratory failure with hypoxia and hypercapnia, initially managed on BiPAP, now off BiPAP and on a 1-2 L of oxygen through nasal cannula, appreciate pulmonary consultation, continue Lasix and antibiotic therapy, attempting to get sputum  cultures for possible pneumonia 2. Right-sided pneumonia of unclear but by serologic agent at present, get sputum cultures. Continue Rocephin and Zithromax 3. Leukocytosis, likely due to steroids. Follow lab wise 4. Acute diastolic CHF, echocardiogram in October 2016 revealed moderate aortic stenosis, severe LVH, mild mitral regurgitation, continue diuretics, adding lisinopril, following clinically 5. Malignant essential hypertension, advanced lisinopril to twice daily, follow blood pressure readings closely   Management plans discussed with the patient, family and they are in agreement.   DRUG ALLERGIES: No Known Allergies  CODE STATUS:     Code Status Orders        Start     Ordered   07/18/15 0740  Full code   Continuous     07/18/15 0741    Advance Directive Documentation        Most Recent Value   Type of Advance Directive  Healthcare Power of Attorney, Living will   Pre-existing out of facility DNR order (yellow form or pink MOST form)     "MOST" Form in Place?        TOTAL TIME TAKING CARE OF THIS PATIENT: 45 minutes.  Discussed with patient's son,  updated  Markita Stcharles M.D on 07/19/2015 at 3:28 PM  Between 7am to 6pm - Pager - [phone]  After 6pm go to [email or web address] - password [email or web address] [facility]  Office  [phone]  CC: Primary care physician; Park Liter, DO

## 2015-07-19 NOTE — Plan of Care (Addendum)
Problem: Pain Management: Goal: Expressions of feelings of enhanced comfort will increase Outcome: Progressing Plan of care progress: -pt continues 2L o2 [place], o2 sats decrease to 80% with exertion MD aware -dyspnea on exertion, improves with rest -neb txs continue -IV soulumedrol -continues IV abts for pna, chest xray shows improving pna -no complaints of pain, no distress or discomfort noted -per son pt becomes anxious in the afternoon MD made aware haldol ordered -pt began to have increased agitation and confusion this afternoon yelling toward staff, not cooperating, diversional activities ineffective, son made aware, haldol given with some relief, 1:1 sitter remains at bedside

## 2015-07-19 NOTE — Plan of Care (Signed)
Problem: Respiratory: Goal: Respiratory status will improve Outcome: Progressing Pt has been tearful most of shift. Pt reported she never had to take nine tablets before. [name] reported that they normally give pt some after dinner and some at bedtime. Pt has not been able to rest since. Pt removed IV x2 and currently only has one IV access. Pt has not slept all shift. Pt was given ativan x1. No other signs of distress noted. Will continue to monitor.

## 2015-07-19 NOTE — Progress Notes (Signed)
Inpatient Diabetes Program Recommendations  AACE/ADA: New Consensus Statement on Inpatient Glycemic Control (2015)  Target Ranges:  Prepandial:   less than 140 mg/dL      Peak postprandial:   less than 180 mg/dL (1-2 hours)      Critically ill patients:  140 - 180 mg/dL   Review of Glycemic Control  Results for STEPH, DOCKUM (MRN [id number]) as of 07/19/2015 09:18  Ref. Range 05/18/2015 07:55 07/04/2015 08:31 07/18/2015 13:09 07/18/2015 17:27 07/18/2015 21:52 07/19/2015 02:17 07/19/2015 04:26 07/19/2015 07:45  Glucose-Capillary Latest Ref Range: 65-99 mg/dL 143 (H) 183 (H) 355 (H) 240 (H) 306 (H) 233 (H) 220 (H) 235 (H)    Diabetes history: Type 2 Outpatient Diabetes medications: none documented in home med list Current orders for Inpatient glycemic control: Lantus 10 units qhs, Novolog 0-20 units tid, Novolog 0-5 units qhs         *steroids 40mg  q12h  Inpatient Diabetes Program Recommendations:  Please consider ordering an A1C to determine control over the past 2-3 months.   Per ADA recommendations "consider performing an A1C on all patients with diabetes or hypreglycemia admitted to the hospital if not performed in the prior 3 months".   Consider increasing Lantus to 20 units qhs (0.21units/kg)- fasting blood sugar 235mg /dl.   Consider adding Novolog mealtime insulin 5 units tid (continue correction insulin as ordered) -decrease insulin as steroids are tapered.   Will follow.

## 2015-07-19 NOTE — Progress Notes (Signed)
* [facility]     Assessment and Plan:  Acute COPD exacerbation. -The patient appears to be doing significantly better, she is currently on room air. -The patient can be changed to a by mouth steroid taper from a stress standpoint. -She could potentially be discharged from respiratory standpoint.  Pneumonia. -Doing better, favor a short course of antibiotics.  Acute respiratory failure secondary to above. -Improved, advance activity.  Patient stable for discharge home for respiratory  standpoint, pulmonary service will sign off for now. Please call if there are any further issues or concerns.   Date: 07/19/2015  MRN# [id number] Theresa Vasquez Dec 07, 1930   Theresa Vasquez is a 81 y.o. old female seen in follow up for chief complaint of  Chief Complaint  Patient presents with  . Respiratory Distress     HPI:  81 y.o. female with a known history of multiple medical problems including peripheral vascular disease, congestive heart failure, paroxysmal atrial fibrillation, COPD who presents to the hospital with 1 day history of cough, shortness of breath.  The patient initially presented with severe hypoxemia with oxygen saturations in the 50s, with cyanosis and respiratory distress. Review of chest ray x-ray images and reports today shows significant improvement from the radiograph taken yesterday, though has not yet appeared to be improved to her baseline images taken in October of this year. She continues to have chronic, severe changes of severe emphysema. Currently the patient is on a regimen of ceftriaxone and azithromycin for pneumonia. She is also receiving Pulmicort nebulizer, DuoNeb's, Cymetra 40 mg every 12 hours, Spiriva for COPD.  Review of testing shows that her blood cultures are negative thus far. Review blood gas shows mild hypercapnic respiratory failure with a PCO2 of 51 and a pH of 7.33.  Today her respiratory status appears significant  improved, she has required anywhere from room air to 2 L of nasal cannula. Currently, oxygen saturations 95% on room air. She appears to be awake and alert without any evidence of respiratory distress. She appears to be confused and [place] is at the beach.  Medication:   Reviewed  Allergies:  Review of patient's allergies indicates no known allergies.  Review of Systems: Gen:  Denies  fever, sweats. HEENT: Denies blurred vision. Cvc:  No dizziness, chest pain or heaviness Resp:   Denies cough or sputum porduction. Gi: Denies swallowing difficulty, stomach pain. constipation, bowel incontinence Gu:  Denies bladder incontinence, burning urine Ext:   No Joint pain, stiffness. Skin: No skin rash, easy bruising. Endoc:  No polyuria, polydipsia. Psych: No depression, insomnia. Other:  All other systems were reviewed and found to be negative other than what is mentioned in the HPI.   Physical Examination:   VS: BP 158/89 mmHg  Pulse 78  Temp(Src) 98.5 F (36.9 C) (Oral)  Resp 20  Ht 5\' 7"  (1.702 m)  Wt 206 lb (93.441 kg)  BMI 32.26 kg/m2  SpO2 95%  General Appearance: No distress  Neuro:without focal findings,  speech normal,  HEENT: PERRLA, EOM intact. Pulmonary: normal breath sounds, No wheezing.   CardiovascularNormal S1,S2.  No m/r/g.   Abdomen: Benign, Soft, non-tender. Renal:  No costovertebral tenderness  GU:  Not performed at this time. Endoc: No evident thyromegaly, no signs of acromegaly. Skin:   warm, no rash. Extremities: normal, no cyanosis, clubbing.   LABORATORY PANEL:   CBC  Recent Labs Lab 07/19/15 0725  WBC 16.4*  HGB 11.2*  HCT 35.5  PLT 370   ------------------------------------------------------------------------------------------------------------------  Chemistries   Recent Labs Lab 07/19/15 0725  NA 136  K 4.4  CL 98*  CO2 28  GLUCOSE 274*  BUN 23*  CREATININE 0.73  CALCIUM 8.5*    ------------------------------------------------------------------------------------------------------------------  Cardiac Enzymes  Recent Labs Lab 07/18/15 0646  TROPONINI 0.03   ------------------------------------------------------------  RADIOLOGY:   No results found for this or any previous visit. Results for orders placed during the hospital encounter of 07/18/15  DG Chest 2 View   Narrative CLINICAL DATA:  Pneumonia.  EXAM: CHEST  2 VIEW  COMPARISON:  July 18, 2015.  FINDINGS: Stable cardiomediastinal silhouette. No pneumothorax is noted. Mild bilateral pleural effusions are noted. Right lung opacity is improved compared to prior exam. However mild interstitial densities are noted throughout both lungs which may represent scarring, but some degree of interstitial edema or inflammation cannot be excluded. The visualized skeletal structures are unremarkable.  IMPRESSION: Significantly decreased right lung opacity is noted consistent with improving pneumonia. Mild bilateral pleural effusions are noted. Stable mild interstitial densities are noted throughout both lungs which may represent scarring, but superimposed interstitial inflammation or edema cannot be excluded.   Electronically Signed   By: Marijo Conception, M.D.   On: 07/19/2015 11:30    ------------------------------------------------------------------------------------------------------------------  Thank  you for allowing [facility] to assist in the care of your patient. Our recommendations are noted above.  Please contact us if we can be of further service.   Marda Stalker, MD.  [facility]  Patricia Pesa, M.D.  Vilinda Boehringer, M.D.  Merton Border, M.D

## 2015-07-20 LAB — BLOOD GAS, ARTERIAL
Acid-Base Excess: 8.8 mmol/L — ABNORMAL HIGH (ref 0.0–3.0)
Bicarbonate: 34.1 mEq/L — ABNORMAL HIGH (ref 21.0–28.0)
FIO2: 32
O2 SAT: 93.4 %
PATIENT TEMPERATURE: 37
PCO2 ART: 49 mmHg — AB (ref 32.0–48.0)
PO2 ART: 65 mmHg — AB (ref 83.0–108.0)
pH, Arterial: 7.45 (ref 7.350–7.450)

## 2015-07-20 LAB — GLUCOSE, CAPILLARY
GLUCOSE-CAPILLARY: 267 mg/dL — AB (ref 65–99)
Glucose-Capillary: 243 mg/dL — ABNORMAL HIGH (ref 65–99)
Glucose-Capillary: 223 mg/dL — ABNORMAL HIGH (ref 65–99)
Glucose-Capillary: 257 mg/dL — ABNORMAL HIGH (ref 65–99)

## 2015-07-20 LAB — CBC
HEMATOCRIT: 35.9 % (ref 35.0–47.0)
HEMOGLOBIN: 11.3 g/dL — AB (ref 12.0–16.0)
MCH: 28.7 pg (ref 26.0–34.0)
MCHC: 31.6 g/dL — AB (ref 32.0–36.0)
MCV: 91.1 fL (ref 80.0–100.0)
Platelets: 363 10*3/uL (ref 150–440)
RBC: 3.94 MIL/uL (ref 3.80–5.20)
RDW: 17.3 % — ABNORMAL HIGH (ref 11.5–14.5)
WBC: 14.1 10*3/uL — ABNORMAL HIGH (ref 3.6–11.0)

## 2015-07-20 MED ORDER — LABETALOL HCL 5 MG/ML IV SOLN
10.0000 mg | INTRAVENOUS | Status: DC | PRN
  Administered 2015-07-20 – 2015-07-22 (×6): 10 mg via INTRAVENOUS
  Filled 2015-07-20 (×7): qty 4

## 2015-07-20 MED ORDER — IPRATROPIUM-ALBUTEROL 0.5-2.5 (3) MG/3ML IN SOLN
3.0000 mL | Freq: Three times a day (TID) | RESPIRATORY_TRACT | Status: DC
  Administered 2015-07-20 – 2015-07-22 (×6): 3 mL via RESPIRATORY_TRACT
  Filled 2015-07-20 (×6): qty 3

## 2015-07-20 MED ORDER — AMLODIPINE BESYLATE 5 MG PO TABS
5.0000 mg | ORAL_TABLET | Freq: Two times a day (BID) | ORAL | Status: DC
  Administered 2015-07-20 – 2015-07-22 (×5): 5 mg via ORAL
  Filled 2015-07-20 (×5): qty 1

## 2015-07-20 NOTE — Progress Notes (Signed)
Dr Ether Griffins made aware that BP recheck 1hr after norvasc, BP now up to 187/69, new order labetalol 10mg  IV every 4 hours as needed for SBP >170

## 2015-07-20 NOTE — Progress Notes (Signed)
Dr Ether Griffins made aware that pt with BP 185/64, made aware of current meds and ABG result, new order norvasc 5mg  PO BID hold for SBP <130

## 2015-07-20 NOTE — Progress Notes (Signed)
[facility] [place] NAME: Theresa Vasquez    MR#:  [id number]  DATE OF BIRTH:  06-30-1931  SUBJECTIVE:  CHIEF COMPLAINT:   Chief Complaint  Patient presents with  . Respiratory Distress   the patient is a 81 year old Caucasian female who presented to the hospital with complaints of severe shortness of breath. Initial chest x-ray was concerning for possible pneumonia on the right, so she was admitted for broad-spectrum antibiotic therapy. Apart from antibiotics. She was initiated on diuretic, she diuresed approximately 550 cc since admission and her condition overall improved. She is back on 2 liters of oxygen today and her O2 sats are 96-99% , desaturates on the room air on exertion . Feels satisfactory, but sleepy, no review of systems  Review of Systems  Unable to perform ROS: mental status change    VITAL SIGNS: Blood pressure 162/58, pulse 65, temperature 98.3 F (36.8 C), temperature source Oral, resp. rate 20, height 5\' 7"  (1.702 m), weight 93.441 kg (206 lb), SpO2 96 %.  PHYSICAL EXAMINATION:   GENERAL:  81 y.o.-year-old patient lying in the bed with no acute distress. Somewhat sleepy, laying in the bed, using duo nebs EYES: Pupils equal, round, reactive to light and accommodation. No scleral icterus. Extraocular muscles intact.  HEENT: Head atraumatic, normocephalic. Oropharynx and nasopharynx clear.  NECK:  Supple, no jugular venous distention. No thyroid enlargement, no tenderness.  LUNGS: Diminished breath sounds on the right with diffuse crackles, no wheezing, rales,rhonchi or crepitation on the left. Intermittent use of accessory muscles of respiration.  CARDIOVASCULAR: S1, S2 normal. No murmurs, rubs, or gallops.  ABDOMEN: Soft, nontender, nondistended. Bowel sounds present. No organomegaly or mass.  EXTREMITIES: No pedal edema, cyanosis, or clubbing.  NEUROLOGIC: Cranial nerves II through XII are intact. Muscle strength  5/5 in all extremities. Sensation intact. Gait not checked.  PSYCHIATRIC: The patient is somnolent and not able to assess orientation .  SKIN: No obvious rash, lesion, or ulcer.   ORDERS/RESULTS REVIEWED:   CBC  Recent Labs Lab 07/18/15 0646 07/19/15 0725 07/20/15 0508  WBC 12.4* 16.4* 14.1*  HGB 12.9 11.2* 11.3*  HCT 40.6 35.5 35.9  PLT 394 370 363  MCV 91.5 90.5 91.1  MCH 29.0 28.5 28.7  MCHC 31.7* 31.5* 31.6*  RDW 16.7* 16.8* 17.3*   ------------------------------------------------------------------------------------------------------------------  Chemistries   Recent Labs Lab 07/18/15 0646 07/19/15 0725  NA 137 136  K 3.6 4.4  CL 99* 98*  CO2 27 28  GLUCOSE 320* 274*  BUN 12 23*  CREATININE 0.71  TEST WILL BE CREDITED 0.73  CALCIUM 8.1* 8.5*   ------------------------------------------------------------------------------------------------------------------ estimated creatinine clearance is 63.6 mL/min (by C-G formula based on Cr of 0.73). ------------------------------------------------------------------------------------------------------------------ No results for input(s): TSH, T4TOTAL, T3FREE, THYROIDAB in the last 72 hours.  Invalid input(s): FREET3  Cardiac Enzymes  Recent Labs Lab 07/18/15 0646  TROPONINI 0.03   ------------------------------------------------------------------------------------------------------------------ Invalid input(s): POCBNP ---------------------------------------------------------------------------------------------------------------  RADIOLOGY: Dg Chest 2 View  07/19/2015  CLINICAL DATA:  Pneumonia. EXAM: CHEST  2 VIEW COMPARISON:  July 18, 2015. FINDINGS: Stable cardiomediastinal silhouette. No pneumothorax is noted. Mild bilateral pleural effusions are noted. Right lung opacity is improved compared to prior exam. However mild interstitial densities are noted throughout both lungs which may represent scarring, but  some degree of interstitial edema or inflammation cannot be excluded. The visualized skeletal structures are unremarkable. IMPRESSION: Significantly decreased right lung opacity is noted consistent with improving pneumonia. Mild bilateral pleural effusions are  noted. Stable mild interstitial densities are noted throughout both lungs which may represent scarring, but superimposed interstitial inflammation or edema cannot be excluded. Electronically Signed   By: Marijo Conception, M.D.   On: 07/19/2015 11:30    EKG:  Orders placed or performed during the hospital encounter of 07/18/15  . ED EKG within 10 minutes  . ED EKG within 10 minutes  . EKG 12-Lead  . EKG 12-Lead    ASSESSMENT AND PLAN:  Principal Problem:   Respiratory failure (HCC) Active Problems:   Community acquired pneumonia   Pneumonia   COPD exacerbation ([place])   Sepsis ([place])   Dementia with behavioral disturbance  #1 acute respiratory failure with hypoxia and hypercapnia, initially managed on BiPAP, now off BiPAP and on a 2 L of oxygen through nasal cannula, appreciate pulmonary consultation, continue Lasix and antibiotic therapy, unable to get sputum cultures for pneumonia. Get ABGs again, as patient remains somnolent to rule out CO2 retention, place patient on BiPAP if needed.  2. Right-sided pneumonia of unclear bacteriologic agent at present, unable to get sputum cultures. Continue Rocephin and Zithromax, clinically and radiographically improving. Remains hypoxic on room air on exertion 3. Leukocytosis, likely due to steroids. Improved 4. Acute diastolic CHF, echocardiogram in October 2016 revealed moderate aortic stenosis, severe LVH, mild mitral regurgitation, continue diuretics, added lisinopril, watching blood pressure readings closely, some better clinically and radiographically 5. Malignant essential hypertension, advanced lisinopril to twice daily, follow blood pressure readings closely   Management plans discussed  with the patient, family and they are in agreement.   DRUG ALLERGIES: No Known Allergies  CODE STATUS:     Code Status Orders        Start     Ordered   07/18/15 0740  Full code   Continuous     07/18/15 0741    Advance Directive Documentation        Most Recent Value   Type of Advance Directive  Healthcare Power of Attorney, Living will   Pre-existing out of facility DNR order (yellow form or pink MOST form)     "MOST" Form in Place?        TOTAL TIME TAKING CARE OF THIS PATIENT: 40 minutes.  Discussed with patient's son,  updated  Lonni Dirden M.D on 07/20/2015 at 1:56 PM  Between 7am to 6pm - Pager - [phone]  After 6pm go to [email or web address] - password [email or web address] [facility]  Office  [phone]  CC: Primary care physician; Park Liter, DO

## 2015-07-20 NOTE — Plan of Care (Signed)
Problem: Pain Management: Goal: Expressions of feelings of enhanced comfort will increase Outcome: Progressing Pt was agitated earlier in the shift. Pt received haldol x1. Pt has been resting comfortably since haldol. No other signs of distress noted. Will continue to monitor.

## 2015-07-20 NOTE — Plan of Care (Signed)
Problem: Pain Management: Goal: Expressions of feelings of enhanced comfort will increase Outcome: Progressing Plan of care progress: -pt continues 2L o2 [place], ABG obtained this shift -dyspnea on exertion, improves with rest -neb txs continue -IV soulumedrol -continues IV abts for pna -afebrile -up x1 assist -no complaints of pain, no distress or discomfort noted -pt has had a better day today, alert but slept more during morning hours; alert and awake during rest of shift, no noted agitation, haldol ordered for agitation if needed. 1:1 sitter at bedside

## 2015-07-21 LAB — GLUCOSE, CAPILLARY
GLUCOSE-CAPILLARY: 238 mg/dL — AB (ref 65–99)
Glucose-Capillary: 294 mg/dL — ABNORMAL HIGH (ref 65–99)
Glucose-Capillary: 261 mg/dL — ABNORMAL HIGH (ref 65–99)
Glucose-Capillary: 279 mg/dL — ABNORMAL HIGH (ref 65–99)

## 2015-07-21 MED ORDER — ENALAPRIL MALEATE 5 MG PO TABS
20.0000 mg | ORAL_TABLET | Freq: Two times a day (BID) | ORAL | Status: DC
  Administered 2015-07-21: 22:00:00 20 mg via ORAL
  Filled 2015-07-21: qty 4

## 2015-07-21 NOTE — Plan of Care (Signed)
Problem: Education: Goal: Ability to identify and alter actions that are detrimental to health will improve Outcome: Progressing Pt was given haldol x1 during shift. Pt denies pain. Pt BP has been elevated throughout the shift and pt was given labetalol x2. No other signs of distress noted. Will continue to monitor.

## 2015-07-21 NOTE — Progress Notes (Addendum)
BP 173/58 PRN med given for elevated BP per orders. BP rechecked at 1200 BP 159/53 will continue to assess

## 2015-07-21 NOTE — Progress Notes (Signed)
[facility] [place] NAME: Theresa Vasquez    MR#:  [id number]  DATE OF BIRTH:  Nov 21, 1930  SUBJECTIVE:  CHIEF COMPLAINT:   Chief Complaint  Patient presents with  . Respiratory Distress   the patient is a 82 year old Caucasian female who presented to the hospital with complaints of severe shortness of breath. Initial chest x-ray was concerning for possible pneumonia on the right, so she was admitted for broad-spectrum antibiotic therapy. Apart from antibiotics. She was initiated on diuretic, she diuresed approximately 550 cc since admission and her condition overall improved. She is back on 2 liters of oxygen today and her O2 sats are 96-99% , desaturates on the room air on exertion . Feels good today Review of Systems  Unable to perform ROS: mental status change    VITAL SIGNS: Blood pressure 159/53, pulse 62, temperature 98.6 F (37 C), temperature source Oral, resp. rate 18, height 5\' 7"  (1.702 m), weight 93.441 kg (206 lb), SpO2 95 %.  PHYSICAL EXAMINATION:   GENERAL:  82 y.o.-year-old patient lying in the bed with no acute distress. Alert and comfortable, communicates with ease   EYES: Pupils equal, round, reactive to light and accommodation. No scleral icterus. Extraocular muscles intact.  HEENT: Head atraumatic, normocephalic. Oropharynx and nasopharynx clear.  NECK:  Supple, no jugular venous distention. No thyroid enlargement, no tenderness.  LUNGS: Mildly diminished breath sounds on the right with few crackles, no wheezing, rales,rhonchi or crepitation on the left. Intermittent use of accessory muscles of respiration.  CARDIOVASCULAR: S1, S2 normal. No murmurs, rubs, or gallops.  ABDOMEN: Soft, nontender, nondistended. Bowel sounds present. No organomegaly or mass.  EXTREMITIES: No pedal edema, cyanosis, or clubbing.  NEUROLOGIC: Cranial nerves II through XII are intact. Muscle strength 5/5 in all extremities. Sensation  intact. Gait not checked.  PSYCHIATRIC: The patient is somnolent and not able to assess orientation .  SKIN: No obvious rash, lesion, or ulcer.   ORDERS/RESULTS REVIEWED:   CBC  Recent Labs Lab 07/18/15 0646 07/19/15 0725 07/20/15 0508  WBC 12.4* 16.4* 14.1*  HGB 12.9 11.2* 11.3*  HCT 40.6 35.5 35.9  PLT 394 370 363  MCV 91.5 90.5 91.1  MCH 29.0 28.5 28.7  MCHC 31.7* 31.5* 31.6*  RDW 16.7* 16.8* 17.3*   ------------------------------------------------------------------------------------------------------------------  Chemistries   Recent Labs Lab 07/18/15 0646 07/19/15 0725  NA 137 136  K 3.6 4.4  CL 99* 98*  CO2 27 28  GLUCOSE 320* 274*  BUN 12 23*  CREATININE 0.71  TEST WILL BE CREDITED 0.73  CALCIUM 8.1* 8.5*   ------------------------------------------------------------------------------------------------------------------ estimated creatinine clearance is 63.6 mL/min (by C-G formula based on Cr of 0.73). ------------------------------------------------------------------------------------------------------------------ No results for input(s): TSH, T4TOTAL, T3FREE, THYROIDAB in the last 72 hours.  Invalid input(s): FREET3  Cardiac Enzymes  Recent Labs Lab 07/18/15 0646  TROPONINI 0.03   ------------------------------------------------------------------------------------------------------------------ Invalid input(s): POCBNP ---------------------------------------------------------------------------------------------------------------  RADIOLOGY: No results found.  EKG:  Orders placed or performed during the hospital encounter of 07/18/15  . ED EKG within 10 minutes  . ED EKG within 10 minutes  . EKG 12-Lead  . EKG 12-Lead    ASSESSMENT AND PLAN:  Principal Problem:   Respiratory failure (HCC) Active Problems:   Community acquired pneumonia   Pneumonia   COPD exacerbation ([place])   Sepsis ([place])   Dementia with behavioral disturbance  #1  acute respiratory failure with hypoxia and hypercapnia, initially managed on BiPAP, now off BiPAP and on a 2  L of oxygen through nasal cannula, appreciate pulmonary consultation, continue Lasix and antibiotic therapy, unable to get sputum cultures for pneumonia. Repeated ABGs yesterday revealed a CO2 level of 49 and normal pH, continue oxygen therapy per nasal cannulas and wean to room air as tolerated 2. Right-sided pneumonia of unclear bacteriologic agent at present, unable to get sputum cultures. Continue Rocephin and Zithromax, clinically and radiographically improving. Remains hypoxic on room air on exertion, following a daily 3. Leukocytosis, likely due to steroids. Improved 4. Acute diastolic CHF, echocardiogram in October 2016 revealed moderate aortic stenosis, severe LVH, mild mitral regurgitation, continue diuretics, advance lisinopril, watching blood pressure readings closely, some better clinically and radiographically 5. Malignant essential hypertension, advance lisinopril to 20 mg twice daily, follow blood pressure readings closely   Management plans discussed with the patient, family and they are in agreement.   DRUG ALLERGIES: No Known Allergies  CODE STATUS:     Code Status Orders        Start     Ordered   07/18/15 0740  Full code   Continuous     07/18/15 0741    Advance Directive Documentation        Most Recent Value   Type of Advance Directive  Healthcare Power of Attorney, Living will   Pre-existing out of facility DNR order (yellow form or pink MOST form)     "MOST" Form in Place?        TOTAL TIME TAKING CARE OF THIS PATIENT: 35 minutes.    Theodoro Grist M.D on 07/21/2015 at 1:28 PM  Between 7am to 6pm - Pager - [phone]  After 6pm go to [email or web address] - password [email or web address] [facility]  Office  [phone]  CC: Primary care physician; Park Liter, DO

## 2015-07-22 ENCOUNTER — Encounter: Payer: Self-pay | Admitting: Radiology

## 2015-07-22 ENCOUNTER — Inpatient Hospital Stay: Payer: Medicare Other

## 2015-07-22 DIAGNOSIS — D72829 Elevated white blood cell count, unspecified: Secondary | ICD-10-CM

## 2015-07-22 DIAGNOSIS — I10 Essential (primary) hypertension: Secondary | ICD-10-CM

## 2015-07-22 LAB — GLUCOSE, CAPILLARY
GLUCOSE-CAPILLARY: 326 mg/dL — AB (ref 65–99)
GLUCOSE-CAPILLARY: 284 mg/dL — AB (ref 65–99)
Glucose-Capillary: 234 mg/dL — ABNORMAL HIGH (ref 65–99)

## 2015-07-22 LAB — BASIC METABOLIC PANEL
Anion gap: 11 (ref 5–15)
BUN: 27 mg/dL — AB (ref 6–20)
CO2: 31 mmol/L (ref 22–32)
CREATININE: 0.79 mg/dL (ref 0.44–1.00)
Calcium: 9.2 mg/dL (ref 8.9–10.3)
Chloride: 95 mmol/L — ABNORMAL LOW (ref 101–111)
GFR calc Af Amer: >60 mL/min (ref >60)
GLUCOSE: 327 mg/dL — AB (ref 65–99)
Potassium: 4.6 mmol/L (ref 3.5–5.1)
SODIUM: 137 mmol/L (ref 135–145)

## 2015-07-22 MED ORDER — AMLODIPINE BESYLATE 5 MG PO TABS: 5.0000 mg | ORAL_TABLET | Freq: Two times a day (BID) | ORAL | Status: DC

## 2015-07-22 MED ORDER — LISINOPRIL 20 MG PO TABS: 20.0000 mg | ORAL_TABLET | Freq: Two times a day (BID) | ORAL | Status: DC

## 2015-07-22 MED ORDER — IOHEXOL 350 MG/ML SOLN
75.0000 mL | Freq: Once | INTRAVENOUS | Status: AC | PRN
  Administered 2015-07-22: 14:00:00 75 mL via INTRAVENOUS

## 2015-07-22 MED ORDER — LISINOPRIL 20 MG PO TABS
20.0000 mg | ORAL_TABLET | Freq: Two times a day (BID) | ORAL | Status: DC
  Administered 2015-07-22: 10:00:00 20 mg via ORAL
  Filled 2015-07-22: qty 1

## 2015-07-22 MED ORDER — DOXYCYCLINE HYCLATE 100 MG PO CAPS: 100.0000 mg | ORAL_CAPSULE | Freq: Two times a day (BID) | ORAL | Status: DC

## 2015-07-22 MED ORDER — HALOPERIDOL 1 MG PO TABS: 0.5000 mg | ORAL_TABLET | Freq: Four times a day (QID) | ORAL | Status: DC | PRN

## 2015-07-22 MED ORDER — BUDESONIDE-FORMOTEROL FUMARATE 160-4.5 MCG/ACT IN AERO
2.0000 | INHALATION_SPRAY | Freq: Two times a day (BID) | RESPIRATORY_TRACT | Status: DC
  Filled 2015-07-22: qty 6

## 2015-07-22 MED ORDER — FUROSEMIDE 40 MG PO TABS: 40.0000 mg | ORAL_TABLET | Freq: Every day | ORAL | Status: DC

## 2015-07-22 NOTE — Care Management Important Message (Signed)
Important Message  Patient Details  Name: Theresa Vasquez MRN: [id number] Date of Birth: [birth year]   Medicare Important Message Given:  Yes    Juliann Pulse A Deanette Tullius 07/22/2015, 11:34 AM

## 2015-07-22 NOTE — Progress Notes (Signed)
0400 BP 174/63  Pt given IV labatolol.  Recheck of BP at 0500 163/52.  Pt resting with sitter at bedside. Dorna Bloom RN

## 2015-07-22 NOTE — Progress Notes (Addendum)
VSS. O2 sats in the high 90's on room air. No c/o sob. Denies pain. Calm and cooperative today. Pt is discharged home. Follow up appointment and meds reviewed with pt and pt's son. Prescription given to pt's son. Discharge instructions given. Educational handouts about prednisone low sodium diet and CHF given and explained to pt and pt' son.

## 2015-07-22 NOTE — Care Management (Signed)
Spoke with son, Nicole Kindred, at the nurse's station. [phone]). States that there are hired personnal caregivers in the home 24/7. [employer]'s last week was last week. Would like to have [employer] back in the home, if possible. [place] in the past. [employer] [place] x 2 at [place]. [employer] x 2 years, then has been in the home 1 year and 3 months. Last was seen by [facility] the 1st of December. [place] in the home. Good appetite. Home oxygen thru [facility]. Uses home oxygen prn at 2liters per nasal cannula. Will transport per private car. Shelbie Ammons RN MSN CCM Care Management [phone]

## 2015-07-22 NOTE — Progress Notes (Signed)
2300- Pt refusing to stay in bed.  Confused to time, place, situations.  Reoriented with little success.  Sitter called to stay for pt safety.  2345 Labetalol 10 mg IV given for BP of 173/59.  0016 Haldol given as pt is getting up and becoming agitated, angry, confused.  Recheck of BP 175/48.  Pt continues to sit at edge of bed, confused and unable to reorient to situation.  Bed alarm is on and sitter is at bedside. Dorna Bloom RN

## 2015-07-22 NOTE — Progress Notes (Signed)
Inpatient Diabetes Program Recommendations  AACE/ADA: New Consensus Statement on Inpatient Glycemic Control (2015)  Target Ranges:  Prepandial:   less than 140 mg/dL      Peak postprandial:   less than 180 mg/dL (1-2 hours)      Critically ill patients:  140 - 180 mg/dL  Results for CHARMIE, HEACOCK (MRN [id number]) as of 07/22/2015 11:29  Ref. Range 07/21/2015 07:27 07/21/2015 11:08 07/21/2015 16:08 07/21/2015 21:26 07/22/2015 07:06 07/22/2015 11:19  Glucose-Capillary Latest Ref Range: 65-99 mg/dL 238 (H) 294 (H) 261 (H) 279 (H) 284 (H) 234 (H)   Review of Glycemic Control  Current orders for Inpatient glycemic control: Novolog 0-20 units TID with meals, Novolog 0-5 units HS, Lantus 10 units QHS  Inpatient Diabetes Program Recommendations: Insulin - Basal: Fasting glucose 284 mg/dl today. If steroids are continued as ordered, please increase Lantus to 18 units QHS (based on 93 kg x 0.2 units). Oral Agents: Patient has Type 2 diabetes but no DM medications listed on home medication list. In reveiwing the chart, noted H&P reports patient takes Glipizide 5 mg QAM with breakfast.  Please consider ordering Glipizide 5 mg QAM with breakfast if appropriate. HgbA1C: Please order an A1C to evaluate glycemic control over the past 2-3 months.  Thanks, Theresa Alderman, RN, MSN, CDE Diabetes Coordinator Inpatient Diabetes Program [phone] (Team Pager from [place] to [place]) [phone] ([facility] office) [phone] [facility] ([facility]) office) [phone] [facility] office)

## 2015-07-22 NOTE — Progress Notes (Signed)
PT Cancellation Note  Patient Details Name: Theresa Vasquez MRN: [id number] DOB: 02/12/1931   Cancelled Treatment:    Reason Eval/Treat Not Completed: Medical issues which prohibited therapy (Consult received and chart reviewed. Noted order for chest CT to rule out PE; will hold until results received and patient cleared for activity.  Will re-attempt at later time/date as medically appropriate.)  Ling Flesch H. Owens Shark, PT, DPT, NCS 07/22/2015, 8:39 AM [phone]

## 2015-07-22 NOTE — Plan of Care (Signed)
Problem: Activity: Goal: Ability to tolerate increased activity will improve Outcome: Progressing Pt up to sit on side of bed frequently and ambulated to bathroom with walker several times this shift.  Problem: Education: Goal: Knowledge of disease or condition will improve Outcome: Not Progressing Pt with confusion and short term memory loss.  Does not recall reason for admission or diagnosis. Goal: Knowledge of the prescribed therapeutic regimen will improve Outcome: Not Progressing Pt forgetful with disorientation to situation, condition and therapeutic regimen.  Explained to pt each time she was given medication what it is prescribed for. Goal: Ability to identify and alter actions that are detrimental to health will improve Outcome: Not Progressing Pt required sitter as unable to alter actions that are detrimental to her health and safety.  Problem: Health Behavior/Discharge Planning: Goal: Ability to manage health-related needs will improve Outcome: Not Progressing Family will need to be involved in pt care at home if pt continues to be confused.  Problem: Coping: Goal: Verbalizations of decreased anxiety will increase Outcome: Progressing Pt able to sleep part of the night after given a snack and lights dimmed and tv turned off.  Problem: Physical Regulation: Goal: Ability to maintain a body temperature in the normal range will improve Outcome: Progressing Afebrile  Problem: Respiratory: Goal: Respiratory status will improve Outcome: Progressing O2 2L 98-99%

## 2015-07-22 NOTE — Discharge Summary (Signed)
[facility] NAME: Theresa Vasquez    MR#:  [id number]  DATE OF BIRTH:  22-Jun-1931  DATE OF ADMISSION:  07/18/2015 ADMITTING PHYSICIAN: Theodoro Grist, MD  DATE OF DISCHARGE: No discharge date for patient encounter.  PRIMARY CARE PHYSICIAN: Park Liter, DO     ADMISSION DIAGNOSIS:  SIRS (systemic inflammatory response syndrome) (HCC) [R65.10] Pneumonia involving right lung, unspecified part of lung [J18.9]  DISCHARGE DIAGNOSIS:  Principal Problem:   Acute respiratory failure with hypoxia and hypercapnia (HCC) Active Problems:   Community acquired pneumonia   Acute diastolic CHF (congestive heart failure) (HCC)   COPD exacerbation (HCC)   Sepsis ([place])   Essential hypertension, malignant   Dementia with behavioral disturbance   Leukocytosis   SECONDARY DIAGNOSIS:   Past Medical History  Diagnosis Date  . Anemia   . Depression   . Hypertension   . Colitis   . Diverticulitis   . Hypothyroidism   . Hyperlipidemia   . PVD (peripheral vascular disease) ([place]) 05/14/2015  . CHF (congestive heart failure) ([place])   . Paroxysmal atrial fibrillation (HCC)     s/p cardioversion  . Iron deficiency anemia   . Dementia   . Vascular dementia without behavioral disturbance   . Age-related osteoporosis without current pathological fracture   . Nonrheumatic mitral valve insufficiency   . Left bundle branch block   . Vascular disorder of intestine ([place])   . Hypoxemia   . Recurrent pneumonia     history of  . Personal history of peptic ulcer disease   . Gastrointestinal hemorrhage   . Edema   . Other psychoactive substance dependence, in remission ([place])   . Diverticulosis of intestine without perforation or abscess without bleeding   . COPD (chronic obstructive pulmonary disease) ([place])   . Anxiety   . Atherosclerosis of aorta ([place])   . Barrett's esophagus without dysplasia   . Hypertensive chronic kidney disease with  stage 1 through stage 4 chronic kidney disease, or unspecified chronic kidney disease   . Other specified symptoms and signs involving the circulatory and respiratory systems   . Essential hemorrhagic thrombocythemia ([place])   . Atrial fibrillation with RVR ([place])   . Pulmonary edema   . Respiratory failure with hypoxia and hypercapnia ([place])   . Type 2 diabetes mellitus with diabetic polyneuropathy ([place])   . Type 2 diabetes mellitus with diabetic chronic kidney disease ([place])   . Diabetes mellitus type II, uncontrolled ([place])   . Lactic acidosis   . High anion gap metabolic acidosis   . Osteoarthritis   . Adenomatous polyps     .pro HOSPITAL COURSE:   The patient is a 82 year old Caucasian female with past medical History of multiple medical problems including moderate aortic stenosis diabetes, peripheral vascular disease, CHF, atrial fibrillation who presents to the hospital with complaints of shortness of breath, cough, severe hypoxia with O2 sats in 50s initially. On initial exam, she sounded rhonchorous and crackly, the chest x-ray revealed multilobar pneumonia on the right side. Patient was admitted to the hospital for further evaluation. She was initiated on antibiotics as well as diuretics for CHF and pneumonia, since her white blood cell count was elevated. With this conservative therapy, her condition improved and, although she had intermittent desaturations requiring oxygen therapy at night and on exertion. Repeat the chest x-ray done on 07/19/2015 reveals significant decrease in the right lung opacity consistent with improving pneumonia, bilateral pleural effusions were also noted,  CT angiogram of the chest done on the 07/22/2015 revealed septal thickening with alveolar groundglass attenuation in upper lobes and lower lobes concerning for pulmonary edema.  Discussion by problem #1 acute respiratory failure with hypoxia and hypercapnia, initially managed on BiPAP, now off BiPAP and on  intermittent 2 L of oxygen through nasal cannula, appreciate pulmonary consultation, continue Lasix and antibiotic therapy with doxycycline, unable to get sputum cultures for pneumonia. Repeated ABGs yesterday revealed a CO2 level of 49 and normal pH, continue oxygen therapy per nasal cannulas and wean to room air as tolerated.  2. Right-sided pneumonia of unclear bacteriologic agent at present, unable to get sputum cultures. CT chest does not show pneumonia but most likely congestive heart failure. We'll discontinue Rocephin and Zithromax, continue doxycycline to complete course , nebulizing therapy , continue oxygen as needed and wean to room air as tolerated. Patient will be prescribed home health services upon discharge 3. Leukocytosis, likely due to steroids. Improved 4. Acute on chronic diastolic CHF, echocardiogram in October 2016 revealed moderate aortic stenosis, severe LVH, mild mitral regurgitation, continue diuretics, advanced lisinopril 20 g twice daily dose with improvement of malignant essential hypertension, watching blood pressure readings closely, advanced medications if needed 5. Malignant essential hypertension, better on current regiment. Continue lisinopril to 20 mg twice daily, follow blood pressure readings closely, continue diuretics   DISCHARGE CONDITIONS:   Stable  CONSULTS OBTAINED:     DRUG ALLERGIES:  No Known Allergies  DISCHARGE MEDICATIONS:   Current Discharge Medication List    START taking these medications   Details  amLODipine (NORVASC) 5 MG tablet Take 1 tablet (5 mg total) by mouth 2 (two) times daily. Qty: 60 tablet, Refills: 6    antiseptic oral rinse (CPC / CETYLPYRIDINIUM CHLORIDE 0.05%) 0.05 % LIQD solution 7 mLs by Mouth Rinse route 2 (two) times daily. Qty: 118 mL, Refills: 0    doxycycline (VIBRAMYCIN) 100 MG capsule Take 1 capsule (100 mg total) by mouth 2 (two) times daily. Qty: 20 capsule, Refills: 0    haloperidol (HALDOL) 1 MG tablet  Take 0.5 tablets (0.5 mg total) by mouth every 6 (six) hours as needed for agitation. Qty: 60 tablet, Refills: 6    lisinopril (PRINIVIL,ZESTRIL) 20 MG tablet Take 1 tablet (20 mg total) by mouth 2 (two) times daily. Qty: 60 tablet, Refills: 6    predniSONE (STERAPRED UNI-PAK 21 TAB) 10 MG (21) TBPK tablet Take 1 tablet (10 mg total) by mouth daily. Please take 60 mg or 6 tablets on the day 1, then taper by 10 mg or 1 tablet daily until finished, thank you Qty: 21 tablet, Refills: 1      CONTINUE these medications which have NOT CHANGED   Details  acetaminophen (TYLENOL) 500 MG tablet Take 500 mg by mouth every 6 (six) hours as needed.    albuterol (PROVENTIL) (2.5 MG/3ML) 0.083% nebulizer solution Take 2.5 mg by nebulization every 4 (four) hours as needed for wheezing or shortness of breath.    amiodarone (PACERONE) 200 MG tablet Take 200 mg by mouth daily.     Cholecalciferol (VITAMIN D-3) 1000 UNITS CAPS Take 1 capsule (1,000 Units total) by mouth daily. Qty: 60 capsule, Refills: 12    divalproex (DEPAKOTE) 250 MG DR tablet Take 1 tablet (250 mg total) by mouth 3 (three) times daily. Qty: 90 tablet, Refills: 6    donepezil (ARICEPT) 10 MG tablet Take 1 tablet (10 mg total) by mouth at bedtime. Qty: 30 tablet, Refills: 6  ferrous sulfate 325 (65 FE) MG tablet Take 1 tablet (325 mg total) by mouth 2 (two) times daily with a meal. Qty: 60 tablet, Refills: 6    Fluticasone-Salmeterol (ADVAIR) 250-50 MCG/DOSE AEPB Inhale 1 puff into the lungs 2 (two) times daily. Qty: 60 each, Refills: 6    furosemide (LASIX) 40 MG tablet Take 1 tablet (40 mg total) by mouth 2 (two) times daily. Qty: 60 tablet, Refills: 6    gabapentin (NEURONTIN) 400 MG capsule Take 1 capsule (400 mg total) by mouth 2 (two) times daily. Qty: 60 capsule, Refills: 6    levothyroxine (SYNTHROID, LEVOTHROID) 150 MCG tablet Take 1 tablet (150 mcg total) by mouth daily before breakfast. Qty: 90 tablet, Refills: 3     LORazepam (ATIVAN) 0.5 MG tablet Take 1 tablet (0.5 mg total) by mouth at bedtime. Qty: 30 tablet, Refills: 5    Melatonin 3 MG TABS Take 1 tablet (3 mg total) by mouth at bedtime. Qty: 1 tablet, Refills: 12    metoprolol (LOPRESSOR) 50 MG tablet Take 0.5 tablets (25 mg total) by mouth 2 (two) times daily. Qty: 30 tablet, Refills: 6    pantoprazole (PROTONIX) 40 MG tablet Take 1 tablet (40 mg total) by mouth 2 (two) times daily. Qty: 60 tablet, Refills: 6    tiotropium (SPIRIVA) 18 MCG inhalation capsule Place 1 capsule (18 mcg total) into inhaler and inhale daily. Qty: 30 capsule, Refills: 6    venlafaxine XR (EFFEXOR XR) 37.5 MG 24 hr capsule Take 1 capsule (37.5 mg total) by mouth daily with breakfast. Qty: 30 capsule, Refills: 6      STOP taking these medications     enalapril (VASOTEC) 5 MG tablet      famotidine (PEPCID) 20 MG tablet      potassium chloride SA (K-DUR,KLOR-CON) 20 MEQ tablet      traMADol (ULTRAM) 50 MG tablet          DISCHARGE INSTRUCTIONS:    Patient is to follow-up with primary care physician, she would also benefit from cardiology consultation as outpatient  If you experience worsening of your admission symptoms, develop shortness of breath, life threatening emergency, suicidal or homicidal thoughts you must seek medical attention immediately by calling 911 or calling your MD immediately  if symptoms less severe.  You Must read complete instructions/literature along with all the possible adverse reactions/side effects for all the Medicines you take and that have been prescribed to you. Take any new Medicines after you have completely understood and accept all the possible adverse reactions/side effects.   Please note  You were cared for by a hospitalist during your hospital stay. If you have any questions about your discharge medications or the care you received while you were in the hospital after you are discharged, you can call the unit  and asked to speak with the hospitalist on call if the hospitalist that took care of you is not available. Once you are discharged, your primary care physician will handle any further medical issues. Please note that NO REFILLS for any discharge medications will be authorized once you are discharged, as it is imperative that you return to your primary care physician (or establish a relationship with a primary care physician if you do not have one) for your aftercare needs so that they can reassess your need for medications and monitor your lab values.    Today   CHIEF COMPLAINT:   Chief Complaint  Patient presents with  . Respiratory Distress  HISTORY OF PRESENT ILLNESS:  Theresa Vasquez  is a 82 y.o. female with a known history of multiple medical problems including moderate aortic stenosis diabetes, peripheral vascular disease, CHF, atrial fibrillation who presents to the hospital with complaints of shortness of breath, cough, severe hypoxia with O2 sats in 50s initially. On initial exam, she sounded rhonchorous and crackly, the chest x-ray revealed multilobar pneumonia on the right side. Patient was admitted to the hospital for further evaluation. She was initiated on antibiotics as well as diuretics for CHF and pneumonia, since her white blood cell count was elevated. With this conservative therapy, her condition improved and, although she had intermittent desaturations requiring oxygen therapy at night and on exertion. Repeat the chest x-ray done on 07/19/2015 reveals significant decrease in the right lung opacity consistent with improving pneumonia, bilateral pleural effusions were also noted, CT angiogram of the chest done on the 07/22/2015 revealed septal thickening with alveolar groundglass attenuation in upper lobes and lower lobes concerning for pulmonary edema.  Discussion by problem #1 acute respiratory failure with hypoxia and hypercapnia, initially managed on BiPAP, now off BiPAP and  on intermittent 2 L of oxygen through nasal cannula, appreciate pulmonary consultation, continue Lasix and antibiotic therapy with doxycycline, unable to get sputum cultures for pneumonia. Repeated ABGs yesterday revealed a CO2 level of 49 and normal pH, continue oxygen therapy per nasal cannulas and wean to room air as tolerated.  2. Right-sided pneumonia of unclear bacteriologic agent at present, unable to get sputum cultures. CT chest does not show pneumonia but most likely congestive heart failure. We'll discontinue Rocephin and Zithromax, continue doxycycline to complete course , nebulizing therapy , continue oxygen as needed and wean to room air as tolerated. Patient will be prescribed home health services upon discharge 3. Leukocytosis, likely due to steroids. Improved 4. Acute on chronic diastolic CHF, echocardiogram in October 2016 revealed moderate aortic stenosis, severe LVH, mild mitral regurgitation, continue diuretics, advanced lisinopril 20 g twice daily dose with improvement of malignant essential hypertension, watching blood pressure readings closely, advanced medications if needed 5. Malignant essential hypertension, better on current regiment. Continue lisinopril to 20 mg twice daily, follow blood pressure readings closely, continue diuretics   VITAL SIGNS:  Blood pressure 151/69, pulse 87, temperature 98.1 F (36.7 C), temperature source Oral, resp. rate 16, height 5\' 7"  (1.702 m), weight 93.441 kg (206 lb), SpO2 100 %.  I/O:   Intake/Output Summary (Last 24 hours) at 07/22/15 1543 Last data filed at 07/22/15 1200  Gross per 24 hour  Intake    480 ml  Output      0 ml  Net    480 ml    PHYSICAL EXAMINATION:  GENERAL:  82 y.o.-year-old patient lying in the bed with no acute distress.  EYES: Pupils equal, round, reactive to light and accommodation. No scleral icterus. Extraocular muscles intact.  HEENT: Head atraumatic, normocephalic. Oropharynx and nasopharynx clear.   NECK:  Supple, no jugular venous distention. No thyroid enlargement, no tenderness.  LUNGS: Some diminished breath sounds bilaterally, no wheezing, , crackles at bases were noted and scattered on the right side. No use of accessory muscles of respiration.  CARDIOVASCULAR: S1, S2 normal. No murmurs, rubs, or gallops.  ABDOMEN: Soft, non-tender, non-distended. Bowel sounds present. No organomegaly or mass.  EXTREMITIES: No pedal edema, cyanosis, or clubbing.  NEUROLOGIC: Cranial nerves II through XII are intact. Muscle strength 5/5 in all extremities. Sensation intact. Gait not checked.  PSYCHIATRIC: The patient is alert and  oriented x 3.  SKIN: No obvious rash, lesion, or ulcer.   DATA REVIEW:   CBC  Recent Labs Lab 07/20/15 0508  WBC 14.1*  HGB 11.3*  HCT 35.9  PLT 363    Chemistries   Recent Labs Lab 07/22/15 0843  NA 137  K 4.6  CL 95*  CO2 31  GLUCOSE 327*  BUN 27*  CREATININE 0.79  CALCIUM 9.2    Cardiac Enzymes  Recent Labs Lab 07/18/15 0646  TROPONINI 0.03    Microbiology Results  Results for orders placed or performed during the hospital encounter of 07/18/15  Blood culture (routine x 2)     Status: None (Preliminary result)   Collection Time: 07/18/15  7:27 AM  Result Value Ref Range Status   Specimen Description BLOOD LEFT HAND  Final   Special Requests   Final    BOTTLES DRAWN AEROBIC AND ANAEROBIC ANA 1ML AEROBIC 5ML   Culture NO GROWTH 3 DAYS  Final   Report Status PENDING  Incomplete  Blood culture (routine x 2)     Status: None (Preliminary result)   Collection Time: 07/18/15  7:40 AM  Result Value Ref Range Status   Specimen Description BLOOD RIGHT HAND  Final   Special Requests   Final    BOTTLES DRAWN AEROBIC AND ANAEROBIC ANA 3ML AEROBIC 5ML   Culture NO GROWTH 3 DAYS  Final   Report Status PENDING  Incomplete    RADIOLOGY:  Ct Angio Chest Pe W/cm &/or Wo Cm  07/22/2015  CLINICAL DATA:  Severe shortness of breath. EXAM: CT  ANGIOGRAPHY CHEST WITH CONTRAST TECHNIQUE: Multidetector CT imaging of the chest was performed using the standard protocol during bolus administration of intravenous contrast. Multiplanar CT image reconstructions and MIPs were obtained to evaluate the vascular anatomy. CONTRAST:  3mL OMNIPAQUE IOHEXOL 350 MG/ML SOLN COMPARISON:  None. FINDINGS: Mediastinum / Lymph Nodes: There is no axillary lymphadenopathy. 12 mm left thyroid nodule noted. 10 mm short axis precarinal lymph node. No mediastinal lymphadenopathy. No hilar lymphadenopathy. Heart is enlarged. No pericardial effusion. The esophagus has normal imaging features. No hilar lymphadenopathy. Lungs / Pleura: Lung windows show a right greater than left interstitial and alveolar opacity with areas of mosaic attenuation in the lower lobes with some areas of lucent lung in the bases appearing hypovascular. No dense focal airspace consolidation. No pleural effusion. Upper Abdomen: 17 mm exophytic cyst identified upper pole right kidney. MSK / Soft Tissues: Bone windows reveal no worrisome lytic or sclerotic osseous lesions. Review of the MIP images confirms the above findings. IMPRESSION: 1. Interlobular septal thickening with alveolar ground-glass attenuation in the upper lobes and more patchy involvement in the lower lobes. Pulmonary edema would be a consideration. A degree of underlying obstructive small airways disease is not excluded, specially in lung bases. Electronically Signed   By: Misty Stanley M.D.   On: 07/22/2015 14:06    EKG:   Orders placed or performed during the hospital encounter of 07/18/15  . ED EKG within 10 minutes  . ED EKG within 10 minutes  . EKG 12-Lead  . EKG 12-Lead      Management plans discussed with the patient, family and they are in agreement.  CODE STATUS:     Code Status Orders        Start     Ordered   07/18/15 0740  Full code   Continuous     07/18/15 0741    Advance Directive Documentation  Most Recent Value   Type of Advance Directive  Healthcare Power of Attorney, Living will   Pre-existing out of facility DNR order (yellow form or pink MOST form)     "MOST" Form in Place?        TOTAL TIME TAKING CARE OF THIS PATIENT: 40 minutes.    Theodoro Grist M.D on 07/22/2015 at 3:43 PM  Between 7am to 6pm - Pager - [phone]  After 6pm go to [email or web address] - password [email or web address]  Office  [phone]  CC: Primary care physician; Park Liter, DO

## 2015-07-22 NOTE — Care Management (Signed)
Discharge to home today per Dr. Ether Griffins. Will fax home health/physical therapy orders to [facility]. Son will transport. Shelbie Ammons RN MSN CCM Care Management [phone]

## 2015-07-23 ENCOUNTER — Telehealth: Payer: Self-pay | Admitting: Family Medicine

## 2015-07-23 LAB — CULTURE, BLOOD (ROUTINE X 2)
CULTURE: NO GROWTH
Culture: NO GROWTH

## 2015-07-23 NOTE — Telephone Encounter (Signed)
[name] came by, pt just released from the hospital with pneumonia. Pt needs RX for nebulizer machine. Pharm is [facility]. If there are any issues please call pt's son Elberta Fortis @ [phone]. Thanks.

## 2015-07-23 NOTE — Telephone Encounter (Signed)
That will need to be hand written. He can come pick it up.

## 2015-07-23 NOTE — Telephone Encounter (Signed)
Forward to provider

## 2015-07-25 ENCOUNTER — Ambulatory Visit (INDEPENDENT_AMBULATORY_CARE_PROVIDER_SITE_OTHER): Payer: Medicare Other | Admitting: Family Medicine

## 2015-07-25 ENCOUNTER — Encounter: Payer: Self-pay | Admitting: Family Medicine

## 2015-07-25 VITALS — BP 132/68 | HR 71 | Temp 98.7°F | Ht 63.3 in | Wt 188.0 lb

## 2015-07-25 DIAGNOSIS — I5031 Acute diastolic (congestive) heart failure: Secondary | ICD-10-CM | POA: Diagnosis not present

## 2015-07-25 DIAGNOSIS — R0902 Hypoxemia: Secondary | ICD-10-CM | POA: Diagnosis not present

## 2015-07-25 DIAGNOSIS — J189 Pneumonia, unspecified organism: Secondary | ICD-10-CM | POA: Diagnosis not present

## 2015-07-25 DIAGNOSIS — I129 Hypertensive chronic kidney disease with stage 1 through stage 4 chronic kidney disease, or unspecified chronic kidney disease: Secondary | ICD-10-CM | POA: Diagnosis not present

## 2015-07-25 NOTE — Assessment & Plan Note (Signed)
Continue home oxygen at this time. Continue to monitor.

## 2015-07-25 NOTE — Assessment & Plan Note (Signed)
Still on doxycycline and prednisone until the end of the week. Will come back for lung check in 2 weeks to make sure lungs clear. CXR at that time.

## 2015-07-25 NOTE — Assessment & Plan Note (Signed)
Better on recheck. Continue current regimen. Continue to monitor. Still on prednisone. Check in 2 weeks when she's off it.

## 2015-07-25 NOTE — Assessment & Plan Note (Signed)
Due to be back and see Dr. Ubaldo Glassing- last saw in November, Appointment made for her to go on 1/19. Continue diuretics. Continue to monitor weight.

## 2015-07-25 NOTE — Progress Notes (Signed)
BP 132/68 mmHg  Pulse 71  Temp(Src) 98.7 F (37.1 C)  Ht 5' 3.3" (1.608 m)  Wt 188 lb (85.276 kg)  BMI 32.98 kg/m2  SpO2 98%   Subjective:    Patient ID: Theresa Vasquez, female    DOB: 1930-11-05, 82 y.o.   MRN: [id number]  HPI: Theresa Vasquez is a 82 y.o. female  Chief Complaint  Patient presents with  . Hospitalization Follow-up    Patient complains of all over body pain.   HOSPITAL FOLLOW UP- admitted to the hospital with oxygen sats in the 50s initially. Started on antibiotics and diuretics for pneumonia and CHG, she improved- but continued to need oxygen at night and with exertion. BP had been very elevated. Did better on BID lisinopril and her diuretics Time since discharge: 3 days Hospital/facility: [facility] Diagnosis:  ADMISSION DIAGNOSIS:  SIRS (systemic inflammatory response syndrome) (HCC) [R65.10] Pneumonia involving right lung, unspecified part of lung [J18.9]  DISCHARGE DIAGNOSIS:  Principal Problem:  Acute respiratory failure with hypoxia and hypercapnia (HCC) Active Problems:  Community acquired pneumonia  Acute diastolic CHF (congestive heart failure) (HCC)  COPD exacerbation (HCC)  Sepsis ([place])  Essential hypertension, malignant  Dementia with behavioral disturbance  Leukocytosis     Procedures/tests: CXR, CT angiogram- septal thickening with alveolar ground glass attenuation in the upper lobes and lower lobes concerning for pulmonary edema, thought to be more likely CHF exacerbation rather than pneumonia, left on doxycycline, but other antibiotics discontinued. Sent home on oxygen.  Consultants: Pulmonology, critical care New medications:  amLODipine (NORVASC) 5 MG tablet Take 1 tablet (5 mg total) by mouth 2 (two) times daily. Qty: 60 tablet, Refills: 6    antiseptic oral rinse (CPC / CETYLPYRIDINIUM CHLORIDE 0.05%) 0.05 % LIQD solution 7 mLs by Mouth Rinse route 2 (two) times daily. Qty: 118 mL, Refills: 0    doxycycline  (VIBRAMYCIN) 100 MG capsule Take 1 capsule (100 mg total) by mouth 2 (two) times daily. Qty: 20 capsule, Refills: 0    haloperidol (HALDOL) 1 MG tablet Take 0.5 tablets (0.5 mg total) by mouth every 6 (six) hours as needed for agitation. Qty: 60 tablet, Refills: 6    lisinopril (PRINIVIL,ZESTRIL) 20 MG tablet Take 1 tablet (20 mg total) by mouth 2 (two) times daily. Qty: 60 tablet, Refills: 6    predniSONE (STERAPRED UNI-PAK 21 TAB) 10 MG (21) TBPK tablet Take 1 tablet (10 mg total) by mouth daily. Please take 60 mg or 6 tablets on the day 1, then taper by 10 mg or 1 tablet daily until finished, thank you Qty: 21 tablet, Refills: 1      CONTINUE these medications which have NOT CHANGED   Details  acetaminophen (TYLENOL) 500 MG tablet Take 500 mg by mouth every 6 (six) hours as needed.    albuterol (PROVENTIL) (2.5 MG/3ML) 0.083% nebulizer solution Take 2.5 mg by nebulization every 4 (four) hours as needed for wheezing or shortness of breath.    amiodarone (PACERONE) 200 MG tablet Take 200 mg by mouth daily.     Cholecalciferol (VITAMIN D-3) 1000 UNITS CAPS Take 1 capsule (1,000 Units total) by mouth daily. Qty: 60 capsule, Refills: 12    divalproex (DEPAKOTE) 250 MG DR tablet Take 1 tablet (250 mg total) by mouth 3 (three) times daily. Qty: 90 tablet, Refills: 6    donepezil (ARICEPT) 10 MG tablet Take 1 tablet (10 mg total) by mouth at bedtime. Qty: 30 tablet, Refills: 6    ferrous sulfate 325 (  65 FE) MG tablet Take 1 tablet (325 mg total) by mouth 2 (two) times daily with a meal. Qty: 60 tablet, Refills: 6    Fluticasone-Salmeterol (ADVAIR) 250-50 MCG/DOSE AEPB Inhale 1 puff into the lungs 2 (two) times daily. Qty: 60 each, Refills: 6    furosemide (LASIX) 40 MG tablet Take 1 tablet (40 mg total) by mouth 2 (two) times daily. Qty: 60 tablet, Refills: 6    gabapentin (NEURONTIN) 400 MG capsule Take 1 capsule (400 mg total) by mouth 2  (two) times daily. Qty: 60 capsule, Refills: 6    levothyroxine (SYNTHROID, LEVOTHROID) 150 MCG tablet Take 1 tablet (150 mcg total) by mouth daily before breakfast. Qty: 90 tablet, Refills: 3    LORazepam (ATIVAN) 0.5 MG tablet Take 1 tablet (0.5 mg total) by mouth at bedtime. Qty: 30 tablet, Refills: 5    Melatonin 3 MG TABS Take 1 tablet (3 mg total) by mouth at bedtime. Qty: 1 tablet, Refills: 12    metoprolol (LOPRESSOR) 50 MG tablet Take 0.5 tablets (25 mg total) by mouth 2 (two) times daily. Qty: 30 tablet, Refills: 6    pantoprazole (PROTONIX) 40 MG tablet Take 1 tablet (40 mg total) by mouth 2 (two) times daily. Qty: 60 tablet, Refills: 6    tiotropium (SPIRIVA) 18 MCG inhalation capsule Place 1 capsule (18 mcg total) into inhaler and inhale daily. Qty: 30 capsule, Refills: 6    venlafaxine XR (EFFEXOR XR) 37.5 MG 24 hr capsule Take 1 capsule (37.5 mg total) by mouth daily with breakfast. Qty: 30 capsule, Refills: 6      STOP taking these medications     enalapril (VASOTEC) 5 MG tablet      famotidine (PEPCID) 20 MG tablet      potassium chloride SA (K-DUR,KLOR-CON) 20 MEQ tablet      traMADol (ULTRAM) 50 MG tablet       Discharge instructions:  Follow up here and recommended seeing Cardiology- follows with Dr. Ubaldo Glassing at [facility] cardiology, last saw him in November. Status: better  Still using the oxygen at home.   Relevant past medical, surgical, family and social history reviewed and updated as indicated. Interim medical history since our last visit reviewed. Allergies and medications reviewed and updated.  Review of Systems  Constitutional: Negative.   HENT: Negative.   Respiratory: Positive for shortness of breath and wheezing. Negative for apnea, cough, choking, chest tightness and stridor.   Cardiovascular: Negative.   Psychiatric/Behavioral: Negative.     Per HPI unless specifically indicated above     Objective:     BP 132/68 mmHg  Pulse 71  Temp(Src) 98.7 F (37.1 C)  Ht 5' 3.3" (1.608 m)  Wt 188 lb (85.276 kg)  BMI 32.98 kg/m2  SpO2 98%  Wt Readings from Last 3 Encounters:  07/25/15 188 lb (85.276 kg)  07/18/15 206 lb (93.441 kg)  06/18/15 195 lb 1.7 oz (88.5 kg)    Physical Exam  Constitutional: She is oriented to person, place, and time. She appears well-developed and well-nourished. No distress.  HENT:  Head: Normocephalic and atraumatic.  Right Ear: Hearing and external ear normal.  Left Ear: Hearing and external ear normal.  Nose: Nose normal.  Mouth/Throat: Oropharynx is clear and moist. No oropharyngeal exudate.  Eyes: Conjunctivae, EOM and lids are normal. Pupils are equal, round, and reactive to light. Right eye exhibits no discharge. Left eye exhibits no discharge. No scleral icterus.  Neck: Normal range of motion. Neck supple. No JVD  present. No tracheal deviation present. No thyromegaly present.  Cardiovascular: Normal rate, regular rhythm and intact distal pulses.  Exam reveals no gallop and no friction rub.   Murmur heard. Pulmonary/Chest: Effort normal. No stridor. No respiratory distress. She has no wheezes. She has rhonchi in the right lower field and the left lower field. She has rales in the right lower field and the left lower field. She exhibits no tenderness.  Musculoskeletal: Normal range of motion.  Lymphadenopathy:    She has no cervical adenopathy.  Neurological: She is alert and oriented to person, place, and time.  Skin: Skin is intact. No rash noted. She is not diaphoretic.  Bruises on the arms  Psychiatric: She has a normal mood and affect. Her speech is normal and behavior is normal. Judgment normal. Cognition and memory are normal.  Nursing note and vitals reviewed.     Assessment & Plan:   Problem List Items Addressed This Visit      Cardiovascular and Mediastinum   Acute diastolic CHF (congestive heart failure) (HCC)    Due to be back and see Dr.  Ubaldo Glassing- last saw in November, Appointment made for her to go on 1/19. Continue diuretics. Continue to monitor weight.         Respiratory   Hypoxia    Continue home oxygen at this time. Continue to monitor.       Community acquired pneumonia - Primary    Still on doxycycline and prednisone until the end of the week. Will come back for lung check in 2 weeks to make sure lungs clear. CXR at that time.         Genitourinary   Benign hypertensive renal disease    Better on recheck. Continue current regimen. Continue to monitor. Still on prednisone. Check in 2 weeks when she's off it.           Follow up plan: Return in about 2 weeks (around 08/08/2015) for Lung recheck.

## 2015-07-31 ENCOUNTER — Other Ambulatory Visit: Payer: Self-pay | Admitting: Family Medicine

## 2015-07-31 MED ORDER — HALOPERIDOL 1 MG PO TABS: 0.5000 mg | ORAL_TABLET | Freq: Four times a day (QID) | ORAL | Status: DC | PRN

## 2015-08-08 ENCOUNTER — Ambulatory Visit: Payer: Medicare Other | Admitting: Family Medicine

## 2015-08-15 ENCOUNTER — Ambulatory Visit (INDEPENDENT_AMBULATORY_CARE_PROVIDER_SITE_OTHER): Payer: Medicare Other | Admitting: Family Medicine

## 2015-08-15 ENCOUNTER — Encounter: Payer: Self-pay | Admitting: Family Medicine

## 2015-08-15 VITALS — BP 136/62 | HR 87 | Temp 99.2°F | Wt 195.0 lb

## 2015-08-15 DIAGNOSIS — E1122 Type 2 diabetes mellitus with diabetic chronic kidney disease: Secondary | ICD-10-CM | POA: Diagnosis not present

## 2015-08-15 DIAGNOSIS — N182 Chronic kidney disease, stage 2 (mild): Secondary | ICD-10-CM | POA: Diagnosis not present

## 2015-08-15 DIAGNOSIS — I129 Hypertensive chronic kidney disease with stage 1 through stage 4 chronic kidney disease, or unspecified chronic kidney disease: Secondary | ICD-10-CM

## 2015-08-15 NOTE — Assessment & Plan Note (Signed)
Not due for A1c yet. Sugars might be up due to seroquel. Continue to monitor and recheck in 3 weeks.

## 2015-08-15 NOTE — Progress Notes (Signed)
BP 136/62 mmHg  Pulse 87  Temp(Src) 99.2 F (37.3 C)  Wt 195 lb (88.451 kg)  SpO2 97%   Subjective:    Patient ID: Theresa Vasquez, female    DOB: 09-Sep-1930, 82 y.o.   MRN: [id number]  HPI: Theresa Vasquez is a 82 y.o. female  Chief Complaint  Patient presents with  . Hyperglycemia   DIABETES- blood sugar has been 260 in the morning Hypoglycemic episodes:no Polydipsia/polyuria: no Visual disturbance: no Chest pain: no Paresthesias: yes, comes and goes, in her fingers Glucose Monitoring: yes  Accucheck frequency: Daily  Fasting glucose: 200s in the AM Taking Insulin?: no Blood Pressure Monitoring: not checking Retinal Examination: 6 months ago- sees [facility] Foot Exam: Up to Date Diabetic Education: Not Completed Pneumovax: Up to Date Influenza: Up to Date Aspirin: no   Relevant past medical, surgical, family and social history reviewed and updated as indicated. Interim medical history since our last visit reviewed. Allergies and medications reviewed and updated.  Review of Systems  Constitutional: Negative.   Respiratory: Negative.   Gastrointestinal: Negative.   Psychiatric/Behavioral: Negative.     Per HPI unless specifically indicated above     Objective:    BP 136/62 mmHg  Pulse 87  Temp(Src) 99.2 F (37.3 C)  Wt 195 lb (88.451 kg)  SpO2 97%  Wt Readings from Last 3 Encounters:  08/15/15 195 lb (88.451 kg)  07/25/15 188 lb (85.276 kg)  07/18/15 206 lb (93.441 kg)    Physical Exam  Constitutional: She is oriented to person, place, and time. She appears well-developed and well-nourished. No distress.  HENT:  Head: Normocephalic and atraumatic.  Right Ear: Hearing normal.  Left Ear: Hearing normal.  Nose: Nose normal.  Eyes: Conjunctivae and lids are normal. Right eye exhibits no discharge. Left eye exhibits no discharge. No scleral icterus.  Cardiovascular: Normal rate, regular rhythm, normal heart sounds and intact distal pulses.   Exam reveals no gallop and no friction rub.   No murmur heard. Pulmonary/Chest: Effort normal and breath sounds normal. No respiratory distress. She has no wheezes. She has no rales. She exhibits no tenderness.  Musculoskeletal: Normal range of motion.  Neurological: She is alert and oriented to person, place, and time.  Skin: Skin is warm, dry and intact. No rash noted. No erythema. No pallor.  Psychiatric: She has a normal mood and affect. Her speech is normal and behavior is normal. Judgment and thought content normal. Cognition and memory are normal.  Nursing note and vitals reviewed.   Results for orders placed or performed during the hospital encounter of 07/18/15  Blood culture (routine x 2)  Result Value Ref Range   Specimen Description BLOOD LEFT HAND    Special Requests      BOTTLES DRAWN AEROBIC AND ANAEROBIC ANA 1ML AEROBIC 5ML   Culture NO GROWTH 5 DAYS    Report Status 07/23/2015 FINAL   Blood culture (routine x 2)  Result Value Ref Range   Specimen Description BLOOD RIGHT HAND    Special Requests      BOTTLES DRAWN AEROBIC AND ANAEROBIC ANA 3ML AEROBIC 5ML   Culture NO GROWTH 5 DAYS    Report Status 07/23/2015 FINAL   Basic metabolic panel  Result Value Ref Range   Sodium 137 135 - 145 mmol/L   Potassium 3.6 3.5 - 5.1 mmol/L   Chloride 99 (L) 101 - 111 mmol/L   CO2 27 22 - 32 mmol/L   Glucose, Bld 320 (H)  65 - 99 mg/dL   BUN 12 6 - 20 mg/dL   Creatinine, Ser 0.71 0.44 - 1.00 mg/dL   Calcium 8.1 (L) 8.9 - 10.3 mg/dL   GFR calc non Af Amer >60 >60 mL/min   GFR calc Af Amer >60 >60 mL/min   Anion gap 11 5 - 15  Troponin I  Result Value Ref Range   Troponin I 0.03 <0.031 ng/mL  Blood gas, arterial  Result Value Ref Range   FIO2 60.00    Delivery systems BILEVEL POSITIVE AIRWAY PRESSURE    Inspiratory PAP 12    Expiratory PAP 8    pH, Arterial 7.33 (L) 7.350 - 7.450   pCO2 arterial 51 (H) 32.0 - 48.0 mmHg   pO2, Arterial 138 (H) 83.0 - 108.0 mmHg    Bicarbonate 26.9 21.0 - 28.0 mEq/L   Acid-Base Excess 0.2 0.0 - 3.0 mmol/L   O2 Saturation 99.0 %   Patient temperature 37.0    Collection site RIGHT RADIAL    Sample type ARTERIAL DRAW    Allens test (pass/fail) YEAST (A) PASS   Mechanical Rate 10   HIV antibody  Result Value Ref Range   HIV Screen 4th Generation wRfx Non Reactive Non Reactive  Strep pneumoniae urinary antigen  Result Value Ref Range   Strep Pneumo Urinary Antigen NEGATIVE NEGATIVE  CBC  Result Value Ref Range   WBC 12.4 (H) 3.6 - 11.0 K/uL   RBC 4.44 3.80 - 5.20 MIL/uL   Hemoglobin 12.9 12.0 - 16.0 g/dL   HCT 40.6 35.0 - 47.0 %   MCV 91.5 80.0 - 100.0 fL   MCH 29.0 26.0 - 34.0 pg   MCHC 31.7 (L) 32.0 - 36.0 g/dL   RDW 16.7 (H) 11.5 - 14.5 %   Platelets 394 150 - 440 K/uL  Creatinine, serum  Result Value Ref Range   Creatinine, Ser TEST WILL BE CREDITED 0.44 - 1.00 mg/dL   GFR calc non Af Amer >60 >60 mL/min   GFR calc Af Amer >60 >60 mL/min  Glucose, capillary  Result Value Ref Range   Glucose-Capillary 355 (H) 65 - 99 mg/dL  Basic metabolic panel  Result Value Ref Range   Sodium 136 135 - 145 mmol/L   Potassium 4.4 3.5 - 5.1 mmol/L   Chloride 98 (L) 101 - 111 mmol/L   CO2 28 22 - 32 mmol/L   Glucose, Bld 274 (H) 65 - 99 mg/dL   BUN 23 (H) 6 - 20 mg/dL   Creatinine, Ser 0.73 0.44 - 1.00 mg/dL   Calcium 8.5 (L) 8.9 - 10.3 mg/dL   GFR calc non Af Amer >60 >60 mL/min   GFR calc Af Amer >60 >60 mL/min   Anion gap 10 5 - 15  CBC  Result Value Ref Range   WBC 16.4 (H) 3.6 - 11.0 K/uL   RBC 3.93 3.80 - 5.20 MIL/uL   Hemoglobin 11.2 (L) 12.0 - 16.0 g/dL   HCT 35.5 35.0 - 47.0 %   MCV 90.5 80.0 - 100.0 fL   MCH 28.5 26.0 - 34.0 pg   MCHC 31.5 (L) 32.0 - 36.0 g/dL   RDW 16.8 (H) 11.5 - 14.5 %   Platelets 370 150 - 440 K/uL  Glucose, capillary  Result Value Ref Range   Glucose-Capillary 240 (H) 65 - 99 mg/dL  Glucose, capillary  Result Value Ref Range   Glucose-Capillary 306 (H) 65 - 99 mg/dL   Glucose, capillary  Result Value Ref  Range   Glucose-Capillary 233 (H) 65 - 99 mg/dL  Glucose, capillary  Result Value Ref Range   Glucose-Capillary 220 (H) 65 - 99 mg/dL   Comment 1 Notify RN   Glucose, capillary  Result Value Ref Range   Glucose-Capillary 235 (H) 65 - 99 mg/dL  Glucose, capillary  Result Value Ref Range   Glucose-Capillary 307 (H) 65 - 99 mg/dL  Glucose, capillary  Result Value Ref Range   Glucose-Capillary 202 (H) 65 - 99 mg/dL  CBC  Result Value Ref Range   WBC 14.1 (H) 3.6 - 11.0 K/uL   RBC 3.94 3.80 - 5.20 MIL/uL   Hemoglobin 11.3 (L) 12.0 - 16.0 g/dL   HCT 35.9 35.0 - 47.0 %   MCV 91.1 80.0 - 100.0 fL   MCH 28.7 26.0 - 34.0 pg   MCHC 31.6 (L) 32.0 - 36.0 g/dL   RDW 17.3 (H) 11.5 - 14.5 %   Platelets 363 150 - 440 K/uL  Glucose, capillary  Result Value Ref Range   Glucose-Capillary 219 (H) 65 - 99 mg/dL   Comment 1 Notify RN   Glucose, capillary  Result Value Ref Range   Glucose-Capillary 243 (H) 65 - 99 mg/dL  Glucose, capillary  Result Value Ref Range   Glucose-Capillary 267 (H) 65 - 99 mg/dL  Blood gas, arterial  Result Value Ref Range   FIO2 32.00    Delivery systems NASAL CANNULA    pH, Arterial 7.45 7.350 - 7.450   pCO2 arterial 49 (H) 32.0 - 48.0 mmHg   pO2, Arterial 65 (L) 83.0 - 108.0 mmHg   Bicarbonate 34.1 (H) 21.0 - 28.0 mEq/L   Acid-Base Excess 8.8 (H) 0.0 - 3.0 mmol/L   O2 Saturation 93.4 %   Patient temperature 37.0    Collection site RIGHT BRACHIAL    Sample type ARTERIAL DRAW   Glucose, capillary  Result Value Ref Range   Glucose-Capillary 223 (H) 65 - 99 mg/dL  Glucose, capillary  Result Value Ref Range   Glucose-Capillary 257 (H) 65 - 99 mg/dL  Glucose, capillary  Result Value Ref Range   Glucose-Capillary 238 (H) 65 - 99 mg/dL   Comment 1 Notify RN   Glucose, capillary  Result Value Ref Range   Glucose-Capillary 294 (H) 65 - 99 mg/dL   Comment 1 Notify RN   Glucose, capillary  Result Value Ref Range    Glucose-Capillary 261 (H) 65 - 99 mg/dL   Comment 1 Notify RN   Glucose, capillary  Result Value Ref Range   Glucose-Capillary 279 (H) 65 - 99 mg/dL  Glucose, capillary  Result Value Ref Range   Glucose-Capillary 284 (H) 65 - 99 mg/dL  Basic metabolic panel  Result Value Ref Range   Sodium 137 135 - 145 mmol/L   Potassium 4.6 3.5 - 5.1 mmol/L   Chloride 95 (L) 101 - 111 mmol/L   CO2 31 22 - 32 mmol/L   Glucose, Bld 327 (H) 65 - 99 mg/dL   BUN 27 (H) 6 - 20 mg/dL   Creatinine, Ser 0.79 0.44 - 1.00 mg/dL   Calcium 9.2 8.9 - 10.3 mg/dL   GFR calc non Af Amer >60 >60 mL/min   GFR calc Af Amer >60 >60 mL/min   Anion gap 11 5 - 15  Glucose, capillary  Result Value Ref Range   Glucose-Capillary 326 (H) 65 - 99 mg/dL  Glucose, capillary  Result Value Ref Range   Glucose-Capillary 234 (H) 65 - 99 mg/dL  Assessment & Plan:   Problem List Items Addressed This Visit      Cardiovascular and Mediastinum   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 Sugars might be up due to seroquel. Continue to monitor and recheck in 3 weeks.       Relevant Medications   aspirin EC 81 MG tablet       Follow up plan: Return in about 3 weeks (around 09/05/2015) for DM visit.

## 2015-08-15 NOTE — Patient Instructions (Signed)
Liraglutide injection What is this medicine? LIRAGLUTIDE (LIR a GLOO tide) is used to improve blood sugar control in adults with type 2 diabetes. This medicine may be used with other oral diabetes medicines. This medicine may be used for other purposes; ask your health care provider or pharmacist if you have questions. What should I tell my health care provider before I take this medicine? They need to know if you have any of these conditions: -endocrine tumors (MEN 2) or if someone in your family had these tumors -gallstones -high cholesterol -history of alcohol abuse problem -history of pancreatitis -kidney disease or if you are on dialysis -liver disease -previous swelling of the tongue, face, or lips with difficulty breathing, difficulty swallowing, hoarseness, or tightening of the throat -stomach problems -suicidal thoughts, plans, or attempt; a previous suicide attempt by you or a family member -thyroid cancer or if someone in your family had thyroid cancer -an unusual or allergic reaction to liraglutide, medicines, foods, dyes, or preservatives -pregnant or trying to get pregnant -breast-feeding How should I use this medicine? This medicine is for injection under the skin of your upper leg, stomach area, or upper arm. You will be taught how to prepare and give this medicine. Use exactly as directed. Take your medicine at regular intervals. Do not take it more often than directed. It is important that you put your used needles and syringes in a special sharps container. Do not put them in a trash can. If you do not have a sharps container, call your pharmacist or healthcare provider to get one. A special MedGuide will be given to you by the pharmacist with each prescription and refill. Be sure to read this information carefully each time. Talk to your pediatrician regarding the use of this medicine in children. Special care may be needed. Overdosage: If you think you have taken too  much of this medicine contact a poison control center or emergency room at once. NOTE: This medicine is only for you. Do not share this medicine with others. What if I miss a dose? If you miss a dose, take it as soon as you can. If it is almost time for your next dose, take only that dose. Do not take double or extra doses. What may interact with this medicine? -acetaminophen -atorvastatin -birth control pills -digoxin -griseofulvin -lisinoprilMany medications may cause changes in blood sugar, these include: -alcohol containing beverages -aspirin and aspirin-like drugs -chloramphenicol -chromium -diuretics -female hormones, such as estrogens or progestins, birth control pills -heart medicines -isoniazid -female hormones or anabolic steroids -medications for weight loss -medicines for allergies, asthma, cold, or cough -medicines for mental problems -medicines called MAO inhibitors - Nardil, Parnate, Marplan, Eldepryl -niacin -NSAIDS, such as ibuprofen -pentamidine -phenytoin -probenecid -quinolone antibiotics such as ciprofloxacin, levofloxacin, ofloxacin -some herbal dietary supplements -steroid medicines such as prednisone or cortisone -thyroid hormonesSome medications can hide the warning symptoms of low blood sugar (hypoglycemia). You may need to monitor your blood sugar more closely if you are taking one of these medications. These include: -beta-blockers, often used for high blood pressure or heart problems (examples include atenolol, metoprolol, propranolol) -clonidine -guanethidine -reserpine This list may not describe all possible interactions. Give your health care provider a list of all the medicines, herbs, non-prescription drugs, or dietary supplements you use. Also tell them if you smoke, drink alcohol, or use illegal drugs. Some items may interact with your medicine. What should I watch for while using this medicine? Visit your doctor or health care   professional for  regular checks on your progress. A test called the HbA1C (A1C) will be monitored. This is a simple blood test. It measures your blood sugar control over the last 2 to 3 months. You will receive this test every 3 to 6 months. Learn how to check your blood sugar. Learn the symptoms of low and high blood sugar and how to manage them. Always carry a quick-source of sugar with you in case you have symptoms of low blood sugar. Examples include hard sugar candy or glucose tablets. Make sure others know that you can choke if you eat or drink when you develop serious symptoms of low blood sugar, such as seizures or unconsciousness. They must get medical help at once. Tell your doctor or health care professional if you have high blood sugar. You might need to change the dose of your medicine. If you are sick or exercising more than usual, you might need to change the dose of your medicine. Do not skip meals. Ask your doctor or health care professional if you should avoid alcohol. Many nonprescription cough and cold products contain sugar or alcohol. These can affect blood sugar. Liraglutide pens and cartridges should never be shared. Even if the needle is changed, sharing may result in passing of viruses like hepatitis or HIV. Wear a medical ID bracelet or chain, and carry a card that describes your disease and details of your medicine and dosage times. Patients and their families should watch out for worsening depression or thoughts of suicide. Also watch out for sudden changes in feelings such as feeling anxious, agitated, panicky, irritable, hostile, aggressive, impulsive, severely restless, overly excited and hyperactive, or not being able to sleep. If this happens, especially at the beginning of treatment or after a change in dose, call your health care professional. What side effects may I notice from receiving this medicine? Side effects that you should report to your doctor or health care professional as  soon as possible: -allergic reactions like skin rash, itching or hives, swelling of the face, lips, or tongue -breathing problems -fever, chills -loss of appetite -signs and symptoms of low blood sugar such as feeling anxious, confusion, dizziness, increased hunger, unusually weak or tired, sweating, shakiness, cold, irritable, headache, blurred vision, fast heartbeat, loss of consciousness -trouble passing urine or change in the amount of urine -unusual stomach pain or upset -vomiting Side effects that usually do not require medical attention (Report these to your doctor or health care professional if they continue or are bothersome.): -constipation -diarrhea -fatigue -headache -nausea This list may not describe all possible side effects. Call your doctor for medical advice about side effects. You may report side effects to FDA at 1-800-FDA-1088. Where should I keep my medicine? Keep out of the reach of children. Store unopened pen in a refrigerator between 2 and 8 degrees C (36 and 46 degrees F). Do not freeze or use if the medicine has been frozen. Protect from light and excessive heat. After you first use the pen, it can be stored at room temperature between 15 and 30 degrees C (59 and 86 degrees F) or in a refrigerator. Throw away your used pen after 30 days or after the expiration date, whichever comes first. Do not store your pen with the needle attached. If the needle is left on, medicine may leak from the pen. NOTE: This sheet is a summary. It may not cover all possible information. If you have questions about this medicine, talk to your doctor,   pharmacist, or health care provider.    2016, Elsevier/Gold Standard. (2013-09-14 10:19:14)  

## 2015-08-29 ENCOUNTER — Ambulatory Visit
Admission: RE | Admit: 2015-08-29 | Discharge: 2015-08-29 | Disposition: A | Discharge status: Home / Self Care | Payer: Medicare Other | Source: Ambulatory Visit | Attending: Family Medicine | Admitting: Family Medicine

## 2015-08-29 ENCOUNTER — Encounter: Payer: Self-pay | Admitting: Family Medicine

## 2015-08-29 ENCOUNTER — Telehealth: Payer: Self-pay | Admitting: Family Medicine

## 2015-08-29 ENCOUNTER — Ambulatory Visit (INDEPENDENT_AMBULATORY_CARE_PROVIDER_SITE_OTHER): Payer: Medicare Other | Admitting: Family Medicine

## 2015-08-29 VITALS — BP 121/67 | HR 70 | Temp 98.8°F | Ht 64.1 in | Wt 198.0 lb

## 2015-08-29 DIAGNOSIS — N182 Chronic kidney disease, stage 2 (mild): Secondary | ICD-10-CM

## 2015-08-29 DIAGNOSIS — I129 Hypertensive chronic kidney disease with stage 1 through stage 4 chronic kidney disease, or unspecified chronic kidney disease: Secondary | ICD-10-CM | POA: Diagnosis not present

## 2015-08-29 DIAGNOSIS — I517 Cardiomegaly: Secondary | ICD-10-CM | POA: Diagnosis not present

## 2015-08-29 DIAGNOSIS — J189 Pneumonia, unspecified organism: Secondary | ICD-10-CM | POA: Diagnosis not present

## 2015-08-29 DIAGNOSIS — E1122 Type 2 diabetes mellitus with diabetic chronic kidney disease: Secondary | ICD-10-CM

## 2015-08-29 DIAGNOSIS — Z09 Encounter for follow-up examination after completed treatment for conditions other than malignant neoplasm: Secondary | ICD-10-CM | POA: Diagnosis not present

## 2015-08-29 DIAGNOSIS — D649 Anemia, unspecified: Secondary | ICD-10-CM

## 2015-08-29 LAB — CBC WITH DIFFERENTIAL/PLATELET
HEMATOCRIT: 37 % (ref 34.0–46.6)
HEMOGLOBIN: 11.7 g/dL (ref 11.1–15.9)
Lymphocytes Absolute: 1.7 10*3/uL (ref 0.7–3.1)
Lymphs: 24 %
MCH: 30.5 pg (ref 26.6–33.0)
MCHC: 31.6 g/dL (ref 31.5–35.7)
MCV: 96 fL (ref 79–97)
MID (Absolute): 1.3 10*3/uL (ref 0.1–1.6)
MID: 18 %
NEUTROS PCT: 58 %
Neutrophils Absolute: 4 10*3/uL (ref 1.4–7.0)
Platelets: 346 10*3/uL (ref 150–379)
RBC: 3.84 x10E6/uL (ref 3.77–5.28)
RDW: 15.7 % — ABNORMAL HIGH (ref 12.3–15.4)
WBC: 7 10*3/uL (ref 3.4–10.8)

## 2015-08-29 LAB — BAYER DCA HB A1C WAIVED: HB A1C: 9 % — AB (ref <7.0)

## 2015-08-29 MED ORDER — METFORMIN HCL ER (MOD) 500 MG PO TB24: 500.0000 mg | ORAL_TABLET | Freq: Every day | ORAL | Status: DC

## 2015-08-29 NOTE — Telephone Encounter (Signed)
Called to let Nicole Kindred know she needs repeat CXR- forgot to tell him at appointment. He will get it done.

## 2015-08-29 NOTE — Assessment & Plan Note (Signed)
A1c back up with the prednisone from the hospitalization. Will put her back on her metformin at this time and recheck in 3 months, if doing well again. Will take her off it again.

## 2015-08-29 NOTE — Assessment & Plan Note (Signed)
Under good control off the prednisone. Continue current regimen. Continue to monitor. CMP checked today.

## 2015-08-29 NOTE — Assessment & Plan Note (Signed)
Lungs sound clear. Will check repeat CXR and see how she does.

## 2015-08-29 NOTE — Telephone Encounter (Signed)
Patient son notified

## 2015-08-29 NOTE — Assessment & Plan Note (Signed)
Rechecking CBC today and it looks good. Remain off blood thinners. Continue to follow with hematology. Continue to monitor.

## 2015-08-29 NOTE — Progress Notes (Signed)
BP 121/67 mmHg  Pulse 70  Temp(Src) 98.8 F (37.1 C)  Ht 5' 4.1" (1.628 m)  Wt 198 lb (89.812 kg)  BMI 33.89 kg/m2  SpO2 95%   Subjective:    Patient ID: Theresa Vasquez, female    DOB: 12/16/1930, 82 y.o.   MRN: [id number]  HPI: Theresa Vasquez is a 82 y.o. female  Chief Complaint  Patient presents with  . Diabetes   DIABETES Hypoglycemic episodes:no Polydipsia/polyuria: no Visual disturbance: no Chest pain: no Paresthesias: no Glucose Monitoring: yes  Accucheck frequency: Daily  Fasting glucose: 260s Taking Insulin?: no Blood Pressure Monitoring: not checking Retinal Examination: Up to date Foot Exam: Up to Date Diabetic Education: Completed Pneumovax: Up to Date Influenza: Up to Date Aspirin: no   HYPERTENSION Hypertension status: controlled  Satisfied with current treatment? yes Duration of hypertension: chronic BP monitoring frequency:  not checking BP medication side effects:  no Medication compliance: good compliance Aspirin: yes Recurrent headaches: no Visual changes: no Palpitations: no Dyspnea: no Chest pain: no Lower extremity edema: no Dizzy/lightheaded: no  Lungs doing better. Feeling better. No concerns   ANEMIA Anemia status: better since stopping her blood thinner. Feeling well.  Etiology of anemia: blood loss Compliance with treatment: good compliance Iron supplementation side effects: no Severity of anemia: severe Fatigue: no Decreased exercise tolerance: no  Dyspnea on exertion: no Palpitations: no Bleeding:yes, had some bright red blood 1x with diarrhea a couple weeks ago, since resolved.   Pica: no  Relevant past medical, surgical, family and social history reviewed and updated as indicated. Interim medical history since our last visit reviewed. Allergies and medications reviewed and updated.  Review of Systems  Constitutional: Negative.   Respiratory: Negative.   Cardiovascular: Negative.   Gastrointestinal: Positive  for diarrhea and blood in stool. Negative for nausea, vomiting, abdominal pain, constipation, abdominal distention, anal bleeding and rectal pain.  Genitourinary: Negative.   Musculoskeletal: Negative.   Psychiatric/Behavioral: Negative.     Per HPI unless specifically indicated above     Objective:    BP 121/67 mmHg  Pulse 70  Temp(Src) 98.8 F (37.1 C)  Ht 5' 4.1" (1.628 m)  Wt 198 lb (89.812 kg)  BMI 33.89 kg/m2  SpO2 95%  Wt Readings from Last 3 Encounters:  08/29/15 198 lb (89.812 kg)  08/15/15 195 lb (88.451 kg)  07/25/15 188 lb (85.276 kg)    Physical Exam  Constitutional: She is oriented to person, place, and time. She appears well-developed and well-nourished. No distress.  HENT:  Head: Normocephalic and atraumatic.  Right Ear: Hearing normal.  Left Ear: Hearing normal.  Nose: Nose normal.  Eyes: Conjunctivae and lids are normal. Right eye exhibits no discharge. Left eye exhibits no discharge. No scleral icterus.  Cardiovascular: Normal rate, regular rhythm and intact distal pulses.  Exam reveals no gallop and no friction rub.   Murmur heard. Pulmonary/Chest: Effort normal and breath sounds normal. No respiratory distress. She has no wheezes. She has no rales. She exhibits no tenderness.  Musculoskeletal: Normal range of motion.  Neurological: She is alert and oriented to person, place, and time.  Skin: Skin is warm, dry and intact. No rash noted. No erythema. No pallor.  Psychiatric: She has a normal mood and affect. Her speech is normal and behavior is normal. Judgment and thought content normal. Cognition and memory are normal.  Nursing note and vitals reviewed.   Results for orders placed or performed during the hospital encounter of 07/18/15  Blood  culture (routine x 2)  Result Value Ref Range   Specimen Description BLOOD LEFT HAND    Special Requests      BOTTLES DRAWN AEROBIC AND ANAEROBIC ANA 1ML AEROBIC 5ML   Culture NO GROWTH 5 DAYS    Report Status  07/23/2015 FINAL   Blood culture (routine x 2)  Result Value Ref Range   Specimen Description BLOOD RIGHT HAND    Special Requests      BOTTLES DRAWN AEROBIC AND ANAEROBIC ANA 3ML AEROBIC 5ML   Culture NO GROWTH 5 DAYS    Report Status 07/23/2015 FINAL   Basic metabolic panel  Result Value Ref Range   Sodium 137 135 - 145 mmol/L   Potassium 3.6 3.5 - 5.1 mmol/L   Chloride 99 (L) 101 - 111 mmol/L   CO2 27 22 - 32 mmol/L   Glucose, Bld 320 (H) 65 - 99 mg/dL   BUN 12 6 - 20 mg/dL   Creatinine, Ser 0.71 0.44 - 1.00 mg/dL   Calcium 8.1 (L) 8.9 - 10.3 mg/dL   GFR calc non Af Amer >60 >60 mL/min   GFR calc Af Amer >60 >60 mL/min   Anion gap 11 5 - 15  Troponin I  Result Value Ref Range   Troponin I 0.03 <0.031 ng/mL  Blood gas, arterial  Result Value Ref Range   FIO2 60.00    Delivery systems BILEVEL POSITIVE AIRWAY PRESSURE    Inspiratory PAP 12    Expiratory PAP 8    pH, Arterial 7.33 (L) 7.350 - 7.450   pCO2 arterial 51 (H) 32.0 - 48.0 mmHg   pO2, Arterial 138 (H) 83.0 - 108.0 mmHg   Bicarbonate 26.9 21.0 - 28.0 mEq/L   Acid-Base Excess 0.2 0.0 - 3.0 mmol/L   O2 Saturation 99.0 %   Patient temperature 37.0    Collection site RIGHT RADIAL    Sample type ARTERIAL DRAW    Allens test (pass/fail) YEAST (A) PASS   Mechanical Rate 10   HIV antibody  Result Value Ref Range   HIV Screen 4th Generation wRfx Non Reactive Non Reactive  Strep pneumoniae urinary antigen  Result Value Ref Range   Strep Pneumo Urinary Antigen NEGATIVE NEGATIVE  CBC  Result Value Ref Range   WBC 12.4 (H) 3.6 - 11.0 K/uL   RBC 4.44 3.80 - 5.20 MIL/uL   Hemoglobin 12.9 12.0 - 16.0 g/dL   HCT 40.6 35.0 - 47.0 %   MCV 91.5 80.0 - 100.0 fL   MCH 29.0 26.0 - 34.0 pg   MCHC 31.7 (L) 32.0 - 36.0 g/dL   RDW 16.7 (H) 11.5 - 14.5 %   Platelets 394 150 - 440 K/uL  Creatinine, serum  Result Value Ref Range   Creatinine, Ser TEST WILL BE CREDITED 0.44 - 1.00 mg/dL   GFR calc non Af Amer >60 >60 mL/min    GFR calc Af Amer >60 >60 mL/min  Glucose, capillary  Result Value Ref Range   Glucose-Capillary 355 (H) 65 - 99 mg/dL  Basic metabolic panel  Result Value Ref Range   Sodium 136 135 - 145 mmol/L   Potassium 4.4 3.5 - 5.1 mmol/L   Chloride 98 (L) 101 - 111 mmol/L   CO2 28 22 - 32 mmol/L   Glucose, Bld 274 (H) 65 - 99 mg/dL   BUN 23 (H) 6 - 20 mg/dL   Creatinine, Ser 0.73 0.44 - 1.00 mg/dL   Calcium 8.5 (L) 8.9 - 10.3 mg/dL  GFR calc non Af Amer >60 >60 mL/min   GFR calc Af Amer >60 >60 mL/min   Anion gap 10 5 - 15  CBC  Result Value Ref Range   WBC 16.4 (H) 3.6 - 11.0 K/uL   RBC 3.93 3.80 - 5.20 MIL/uL   Hemoglobin 11.2 (L) 12.0 - 16.0 g/dL   HCT 35.5 35.0 - 47.0 %   MCV 90.5 80.0 - 100.0 fL   MCH 28.5 26.0 - 34.0 pg   MCHC 31.5 (L) 32.0 - 36.0 g/dL   RDW 16.8 (H) 11.5 - 14.5 %   Platelets 370 150 - 440 K/uL  Glucose, capillary  Result Value Ref Range   Glucose-Capillary 240 (H) 65 - 99 mg/dL  Glucose, capillary  Result Value Ref Range   Glucose-Capillary 306 (H) 65 - 99 mg/dL  Glucose, capillary  Result Value Ref Range   Glucose-Capillary 233 (H) 65 - 99 mg/dL  Glucose, capillary  Result Value Ref Range   Glucose-Capillary 220 (H) 65 - 99 mg/dL   Comment 1 Notify RN   Glucose, capillary  Result Value Ref Range   Glucose-Capillary 235 (H) 65 - 99 mg/dL  Glucose, capillary  Result Value Ref Range   Glucose-Capillary 307 (H) 65 - 99 mg/dL  Glucose, capillary  Result Value Ref Range   Glucose-Capillary 202 (H) 65 - 99 mg/dL  CBC  Result Value Ref Range   WBC 14.1 (H) 3.6 - 11.0 K/uL   RBC 3.94 3.80 - 5.20 MIL/uL   Hemoglobin 11.3 (L) 12.0 - 16.0 g/dL   HCT 35.9 35.0 - 47.0 %   MCV 91.1 80.0 - 100.0 fL   MCH 28.7 26.0 - 34.0 pg   MCHC 31.6 (L) 32.0 - 36.0 g/dL   RDW 17.3 (H) 11.5 - 14.5 %   Platelets 363 150 - 440 K/uL  Glucose, capillary  Result Value Ref Range   Glucose-Capillary 219 (H) 65 - 99 mg/dL   Comment 1 Notify RN   Glucose, capillary  Result  Value Ref Range   Glucose-Capillary 243 (H) 65 - 99 mg/dL  Glucose, capillary  Result Value Ref Range   Glucose-Capillary 267 (H) 65 - 99 mg/dL  Blood gas, arterial  Result Value Ref Range   FIO2 32.00    Delivery systems NASAL CANNULA    pH, Arterial 7.45 7.350 - 7.450   pCO2 arterial 49 (H) 32.0 - 48.0 mmHg   pO2, Arterial 65 (L) 83.0 - 108.0 mmHg   Bicarbonate 34.1 (H) 21.0 - 28.0 mEq/L   Acid-Base Excess 8.8 (H) 0.0 - 3.0 mmol/L   O2 Saturation 93.4 %   Patient temperature 37.0    Collection site RIGHT BRACHIAL    Sample type ARTERIAL DRAW   Glucose, capillary  Result Value Ref Range   Glucose-Capillary 223 (H) 65 - 99 mg/dL  Glucose, capillary  Result Value Ref Range   Glucose-Capillary 257 (H) 65 - 99 mg/dL  Glucose, capillary  Result Value Ref Range   Glucose-Capillary 238 (H) 65 - 99 mg/dL   Comment 1 Notify RN   Glucose, capillary  Result Value Ref Range   Glucose-Capillary 294 (H) 65 - 99 mg/dL   Comment 1 Notify RN   Glucose, capillary  Result Value Ref Range   Glucose-Capillary 261 (H) 65 - 99 mg/dL   Comment 1 Notify RN   Glucose, capillary  Result Value Ref Range   Glucose-Capillary 279 (H) 65 - 99 mg/dL  Glucose, capillary  Result Value Ref  Range   Glucose-Capillary 284 (H) 65 - 99 mg/dL  Basic metabolic panel  Result Value Ref Range   Sodium 137 135 - 145 mmol/L   Potassium 4.6 3.5 - 5.1 mmol/L   Chloride 95 (L) 101 - 111 mmol/L   CO2 31 22 - 32 mmol/L   Glucose, Bld 327 (H) 65 - 99 mg/dL   BUN 27 (H) 6 - 20 mg/dL   Creatinine, Ser 0.79 0.44 - 1.00 mg/dL   Calcium 9.2 8.9 - 10.3 mg/dL   GFR calc non Af Amer >60 >60 mL/min   GFR calc Af Amer >60 >60 mL/min   Anion gap 11 5 - 15  Glucose, capillary  Result Value Ref Range   Glucose-Capillary 326 (H) 65 - 99 mg/dL  Glucose, capillary  Result Value Ref Range   Glucose-Capillary 234 (H) 65 - 99 mg/dL      Assessment & Plan:   Problem List Items Addressed This Visit      Cardiovascular and  Mediastinum   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 Will put her back on her metformin at this time and recheck in 3 months, if doing well again. Will take her off it again.       Relevant Medications   metFORMIN (GLUMETZA) 500 MG (MOD) 24 hr tablet   Other Relevant Orders   Bayer DCA Hb A1c Waived     Respiratory   Community acquired pneumonia    Lungs sound clear. Will check repeat CXR and see how she does.       Relevant Orders   DG Chest 2 View     Genitourinary   Benign hypertensive renal disease    Under good control off the prednisone. Continue current regimen. Continue to monitor. CMP checked today.      Relevant Orders   Comprehensive metabolic panel     Other   Severe anemia    Rechecking CBC today and it looks good. Remain off blood thinners. Continue to follow with hematology. Continue to monitor.       Relevant Orders   CBC With Differential/Platelet       Follow up plan: Return in about 4 weeks (around 09/26/2015) for Recheck sugars.

## 2015-08-29 NOTE — Telephone Encounter (Signed)
Please let Nicole Kindred know that his Mom's pneumonia is all gone. Thanks!

## 2015-08-30 ENCOUNTER — Encounter: Payer: Self-pay | Admitting: Family Medicine

## 2015-08-30 LAB — COMPREHENSIVE METABOLIC PANEL
ALBUMIN: 3.6 g/dL (ref 3.5–4.7)
ALT: 43 IU/L — ABNORMAL HIGH (ref 0–32)
AST: 45 IU/L — ABNORMAL HIGH (ref 0–40)
Albumin/Globulin Ratio: 1.2 (ref 1.1–2.5)
Alkaline Phosphatase: 70 IU/L (ref 39–117)
BUN / CREAT RATIO: 20 (ref 11–26)
BUN: 18 mg/dL (ref 8–27)
Bilirubin Total: <0.2 mg/dL (ref 0.0–1.2)
CALCIUM: 8.8 mg/dL (ref 8.7–10.3)
CO2: 31 mmol/L — AB (ref 18–29)
Chloride: 92 mmol/L — ABNORMAL LOW (ref 96–106)
Creatinine, Ser: 0.9 mg/dL (ref 0.57–1.00)
GFR, EST AFRICAN AMERICAN: 69 mL/min/{1.73_m2} (ref >59)
GFR, EST NON AFRICAN AMERICAN: 60 mL/min/{1.73_m2} (ref >59)
GLOBULIN, TOTAL: 2.9 g/dL (ref 1.5–4.5)
GLUCOSE: 255 mg/dL — AB (ref 65–99)
Potassium: 4.1 mmol/L (ref 3.5–5.2)
Sodium: 140 mmol/L (ref 134–144)
TOTAL PROTEIN: 6.5 g/dL (ref 6.0–8.5)

## 2015-09-17 ENCOUNTER — Inpatient Hospital Stay: Payer: Medicare Other

## 2015-09-17 ENCOUNTER — Inpatient Hospital Stay: Payer: Medicare Other | Admitting: Oncology

## 2015-09-26 ENCOUNTER — Ambulatory Visit (INDEPENDENT_AMBULATORY_CARE_PROVIDER_SITE_OTHER): Payer: Medicare Other | Admitting: Family Medicine

## 2015-09-26 ENCOUNTER — Encounter: Payer: Self-pay | Admitting: Family Medicine

## 2015-09-26 VITALS — BP 124/64 | HR 66 | Temp 98.7°F | Wt 199.0 lb

## 2015-09-26 DIAGNOSIS — I129 Hypertensive chronic kidney disease with stage 1 through stage 4 chronic kidney disease, or unspecified chronic kidney disease: Secondary | ICD-10-CM

## 2015-09-26 DIAGNOSIS — N182 Chronic kidney disease, stage 2 (mild): Secondary | ICD-10-CM | POA: Diagnosis not present

## 2015-09-26 DIAGNOSIS — E1122 Type 2 diabetes mellitus with diabetic chronic kidney disease: Secondary | ICD-10-CM

## 2015-09-26 NOTE — Assessment & Plan Note (Signed)
Will continue metformin. Due for A1c in 2 months. BP better on recheck. Continue to monitor. Call with any problems.

## 2015-09-26 NOTE — Progress Notes (Signed)
BP 124/64 mmHg  Pulse 66  Temp(Src) 98.7 F (37.1 C)  Wt 199 lb (90.266 kg)  SpO2 90%   Subjective:    Patient ID: Theresa Vasquez, female    DOB: 1930/08/26, 82 y.o.   MRN: [id number]  HPI: Theresa Vasquez is a 82 y.o. female  Chief Complaint  Patient presents with  . Diabetes   DIABETES- restarted back on metformin 1 month ago after being on a bunch of prednisone. Here today to see how she's doing on it. Sugars have been under 200. Has not had any diarrhea with the metformin Hypoglycemic episodes:no Polydipsia/polyuria: no Visual disturbance: no Chest pain: no Paresthesias: no Glucose Monitoring: yes  Accucheck frequency: Daily Taking Insulin?: no Blood Pressure Monitoring: not checking Retinal Examination: Up to Date Foot Exam: Up to Date Diabetic Education: Completed Pneumovax: Up to Date Influenza: Up to Date Aspirin: no  Relevant past medical, surgical, family and social history reviewed and updated as indicated. Interim medical history since our last visit reviewed. Allergies and medications reviewed and updated.  Review of Systems  Constitutional: Positive for unexpected weight change. Negative for fever, chills, diaphoresis, activity change, appetite change and fatigue.  Respiratory: Negative.   Cardiovascular: Negative.   Gastrointestinal: Negative.   Psychiatric/Behavioral: Negative.     Per HPI unless specifically indicated above     Objective:    BP 124/64 mmHg  Pulse 66  Temp(Src) 98.7 F (37.1 C)  Wt 199 lb (90.266 kg)  SpO2 90%  Wt Readings from Last 3 Encounters:  09/26/15 199 lb (90.266 kg)  08/29/15 198 lb (89.812 kg)  08/15/15 195 lb (88.451 kg)    Physical Exam  Constitutional: She is oriented to person, place, and time. She appears well-developed and well-nourished. No distress.  HENT:  Head: Normocephalic and atraumatic.  Right Ear: Hearing normal.  Left Ear: Hearing normal.  Nose: Nose normal.  Eyes: Conjunctivae and lids  are normal. Right eye exhibits no discharge. Left eye exhibits no discharge. No scleral icterus.  Cardiovascular: Normal rate, regular rhythm, normal heart sounds and intact distal pulses.  Exam reveals no gallop and no friction rub.   No murmur heard. Pulmonary/Chest: Effort normal and breath sounds normal. No respiratory distress. She has no wheezes. She has no rales. She exhibits no tenderness.  Musculoskeletal: Normal range of motion.  Neurological: She is alert and oriented to person, place, and time.  Skin: Skin is warm, dry and intact. No rash noted. No erythema. No pallor.  Psychiatric: She has a normal mood and affect. Her speech is normal and behavior is normal. Judgment and thought content normal. Cognition and memory are normal.  Nursing note and vitals reviewed.   Results for orders placed or performed in visit on 09/04/15  HM DIABETES EYE EXAM  Result Value Ref Range   HM Diabetic Eye Exam No Retinopathy No Retinopathy      Assessment & Plan:   Problem List Items Addressed This Visit      Cardiovascular and Mediastinum   Type 2 DM with CKD stage 2 and hypertension ([place]) - Primary    Will continue metformin. Due for A1c in 2 months. BP better on recheck. Continue to monitor. Call with any problems.           Follow up plan: Return in about 2 months (around 11/26/2015) for DM visit. Marland Kitchen

## 2015-09-27 NOTE — Telephone Encounter (Signed)
done

## 2015-10-11 ENCOUNTER — Telehealth: Payer: Self-pay

## 2015-10-11 NOTE — Telephone Encounter (Signed)
Called Janett Billow and let her know what Dr. Wynetta Emery said.

## 2015-10-11 NOTE — Telephone Encounter (Signed)
She;s just on the lisinopril not the enlalapril.

## 2015-10-11 NOTE — Telephone Encounter (Signed)
Jessica from [facility] called asking about 2 of patient's medications. She states that they have enalapril and lisinopril in their system from 2 different providers. They want to know if patient is supposed to be taking both medications or which one she is supposed to be taking. I see that enalapril was DC and is listed in historical medications. Just want to run this by the provider for a final answer.

## 2015-10-31 ENCOUNTER — Telehealth: Payer: Self-pay | Admitting: Family Medicine

## 2015-10-31 NOTE — Telephone Encounter (Signed)
Patient aid called and needs to talk to Dr. Wynetta Emery or CMA regarding cellulites patient may have. She isn't sure if she needs to go ahead and take her to ER? Please return Theresa Vasquez [phone]

## 2015-10-31 NOTE — Telephone Encounter (Signed)
Dr.Johnson, would you like me to let her know to go ahead and take her.

## 2015-10-31 NOTE — Telephone Encounter (Signed)
ER/Urgent care please if we don't have any appointments

## 2015-10-31 NOTE — Telephone Encounter (Signed)
Called and left voicemail letting the son know to take her to ER or UC

## 2015-11-26 ENCOUNTER — Ambulatory Visit (INDEPENDENT_AMBULATORY_CARE_PROVIDER_SITE_OTHER): Payer: Medicare Other | Admitting: Family Medicine

## 2015-11-26 ENCOUNTER — Encounter: Payer: Self-pay | Admitting: Family Medicine

## 2015-11-26 ENCOUNTER — Other Ambulatory Visit: Payer: Self-pay | Admitting: Family Medicine

## 2015-11-26 VITALS — BP 115/64 | HR 87 | Temp 98.7°F | Wt 194.0 lb

## 2015-11-26 DIAGNOSIS — N182 Chronic kidney disease, stage 2 (mild): Secondary | ICD-10-CM

## 2015-11-26 DIAGNOSIS — F0391 Unspecified dementia with behavioral disturbance: Secondary | ICD-10-CM | POA: Diagnosis not present

## 2015-11-26 DIAGNOSIS — D509 Iron deficiency anemia, unspecified: Secondary | ICD-10-CM

## 2015-11-26 DIAGNOSIS — E1122 Type 2 diabetes mellitus with diabetic chronic kidney disease: Secondary | ICD-10-CM | POA: Diagnosis not present

## 2015-11-26 DIAGNOSIS — I480 Paroxysmal atrial fibrillation: Secondary | ICD-10-CM | POA: Diagnosis not present

## 2015-11-26 DIAGNOSIS — I129 Hypertensive chronic kidney disease with stage 1 through stage 4 chronic kidney disease, or unspecified chronic kidney disease: Secondary | ICD-10-CM | POA: Diagnosis not present

## 2015-11-26 DIAGNOSIS — J449 Chronic obstructive pulmonary disease, unspecified: Secondary | ICD-10-CM

## 2015-11-26 DIAGNOSIS — K922 Gastrointestinal hemorrhage, unspecified: Secondary | ICD-10-CM | POA: Diagnosis not present

## 2015-11-26 DIAGNOSIS — D649 Anemia, unspecified: Secondary | ICD-10-CM

## 2015-11-26 DIAGNOSIS — D473 Essential (hemorrhagic) thrombocythemia: Secondary | ICD-10-CM

## 2015-11-26 DIAGNOSIS — E1142 Type 2 diabetes mellitus with diabetic polyneuropathy: Secondary | ICD-10-CM

## 2015-11-26 DIAGNOSIS — E039 Hypothyroidism, unspecified: Secondary | ICD-10-CM

## 2015-11-26 DIAGNOSIS — F03918 Unspecified dementia, unspecified severity, with other behavioral disturbance: Secondary | ICD-10-CM

## 2015-11-26 DIAGNOSIS — D75839 Thrombocytosis, unspecified: Secondary | ICD-10-CM

## 2015-11-26 LAB — BAYER DCA HB A1C WAIVED: HB A1C (BAYER DCA - WAIVED): 6.7 % (ref <7.0)

## 2015-11-26 MED ORDER — DIVALPROEX SODIUM 250 MG PO DR TAB: 250.0000 mg | DELAYED_RELEASE_TABLET | Freq: Three times a day (TID) | ORAL | Status: DC

## 2015-11-26 MED ORDER — METOPROLOL TARTRATE 50 MG PO TABS: 25.0000 mg | ORAL_TABLET | Freq: Two times a day (BID) | ORAL | Status: DC

## 2015-11-26 MED ORDER — AMLODIPINE BESYLATE 5 MG PO TABS: 5.0000 mg | ORAL_TABLET | Freq: Two times a day (BID) | ORAL | Status: DC

## 2015-11-26 MED ORDER — VITAMIN D-3 25 MCG (1000 UT) PO CAPS: 1000.0000 [IU] | ORAL_CAPSULE | Freq: Every day | ORAL | Status: AC

## 2015-11-26 MED ORDER — ALBUTEROL SULFATE (2.5 MG/3ML) 0.083% IN NEBU: 2.5000 mg | INHALATION_SOLUTION | RESPIRATORY_TRACT | Status: AC | PRN

## 2015-11-26 MED ORDER — TIOTROPIUM BROMIDE MONOHYDRATE 18 MCG IN CAPS: 18.0000 ug | ORAL_CAPSULE | Freq: Every day | RESPIRATORY_TRACT | Status: DC

## 2015-11-26 MED ORDER — FERROUS SULFATE 325 (65 FE) MG PO TABS: 325.0000 mg | ORAL_TABLET | Freq: Two times a day (BID) | ORAL | Status: DC

## 2015-11-26 MED ORDER — VENLAFAXINE HCL ER 37.5 MG PO CP24: 37.5000 mg | ORAL_CAPSULE | Freq: Every day | ORAL | Status: DC

## 2015-11-26 MED ORDER — LISINOPRIL 20 MG PO TABS
20.0000 mg | ORAL_TABLET | Freq: Two times a day (BID) | ORAL | Status: DC
Start: 2015-11-26 — End: 2016-07-23

## 2015-11-26 MED ORDER — FUROSEMIDE 40 MG PO TABS: 40.0000 mg | ORAL_TABLET | Freq: Two times a day (BID) | ORAL | Status: DC

## 2015-11-26 MED ORDER — GABAPENTIN 400 MG PO CAPS: 400.0000 mg | ORAL_CAPSULE | Freq: Two times a day (BID) | ORAL | Status: DC

## 2015-11-26 MED ORDER — DONEPEZIL HCL 10 MG PO TABS: 10.0000 mg | ORAL_TABLET | Freq: Every day | ORAL | Status: AC

## 2015-11-26 MED ORDER — FLUTICASONE-SALMETEROL 250-50 MCG/DOSE IN AEPB: 1.0000 | INHALATION_SPRAY | Freq: Two times a day (BID) | RESPIRATORY_TRACT | Status: DC

## 2015-11-26 MED ORDER — PANTOPRAZOLE SODIUM 40 MG PO TBEC: 40.0000 mg | DELAYED_RELEASE_TABLET | Freq: Two times a day (BID) | ORAL | Status: DC

## 2015-11-26 MED ORDER — METFORMIN HCL ER (MOD) 500 MG PO TB24: 500.0000 mg | ORAL_TABLET | Freq: Every day | ORAL | Status: DC

## 2015-11-26 MED ORDER — QUETIAPINE FUMARATE 25 MG PO TABS: 25.0000 mg | ORAL_TABLET | Freq: Every day | ORAL | Status: AC

## 2015-11-26 NOTE — Progress Notes (Signed)
BP 115/64 mmHg  Pulse 87  Temp(Src) 98.7 F (37.1 C)  Wt 194 lb (87.998 kg)  SpO2 96%   Subjective:    Patient ID: Theresa Vasquez, female    DOB: Jan 14, 1931, 82 y.o.   MRN: [id number]  HPI: Theresa Vasquez is a 82 y.o. female  Chief Complaint  Patient presents with  . Diabetes   DIABETES- A1c 6.7 Hypoglycemic episodes:no Polydipsia/polyuria: no Visual disturbance: no Chest pain: no Paresthesias: no Glucose Monitoring: no Taking Insulin?: no Blood Pressure Monitoring: not checking Retinal Examination: Up to Date Foot Exam: Up to Date Diabetic Education: Completed Pneumovax: Up to Date Influenza: Up to Date Aspirin: yes  HYPERTENSION / HYPERLIPIDEMIA Satisfied with current treatment? yes Duration of hypertension: chronic BP monitoring frequency: not checking BP medication side effects: no Duration of hyperlipidemia: chronic Cholesterol medication side effects: no Cholesterol supplements: none Medication compliance: not on anything Aspirin: no Recent stressors: no Recurrent headaches: no Visual changes: no Palpitations: no Dyspnea: no Chest pain: no Lower extremity edema: no Dizzy/lightheaded: no  ANEMIA Anemia status: controlled Etiology of anemia: GI bleed Duration of anemia treatment: chronic  Compliance with treatment: excellent compliance Iron supplementation side effects: no Severity of anemia: moderate Fatigue: yes Decreased exercise tolerance: yes  Dyspnea on exertion: yes Palpitations: no Bleeding: no Pica: no  Relevant past medical, surgical, family and social history reviewed and updated as indicated. Interim medical history since our last visit reviewed. Allergies and medications reviewed and updated.  Review of Systems  Constitutional: Negative.   Respiratory: Negative.   Cardiovascular: Negative.   Musculoskeletal: Negative.   Skin: Negative.   Psychiatric/Behavioral: Negative.     Per HPI unless specifically indicated  above     Objective:    BP 115/64 mmHg  Pulse 87  Temp(Src) 98.7 F (37.1 C)  Wt 194 lb (87.998 kg)  SpO2 96%  Wt Readings from Last 3 Encounters:  11/26/15 194 lb (87.998 kg)  09/26/15 199 lb (90.266 kg)  08/29/15 198 lb (89.812 kg)    Physical Exam  Constitutional: She is oriented to person, place, and time. She appears well-developed and well-nourished. No distress.  HENT:  Head: Normocephalic and atraumatic.  Right Ear: Hearing normal.  Left Ear: Hearing normal.  Nose: Nose normal.  Eyes: Conjunctivae and lids are normal. Right eye exhibits no discharge. Left eye exhibits no discharge. No scleral icterus.  Cardiovascular: Normal rate, regular rhythm, normal heart sounds and intact distal pulses.  Exam reveals no gallop and no friction rub.   No murmur heard. Pulmonary/Chest: Effort normal and breath sounds normal. No respiratory distress. She has no wheezes. She has no rales. She exhibits no tenderness.  Abdominal: Soft. Bowel sounds are normal. She exhibits no distension and no mass. There is no tenderness. There is no rebound and no guarding.  Musculoskeletal: Normal range of motion.  Neurological: She is alert and oriented to person, place, and time.  Skin: Skin is warm, dry and intact. No rash noted. No erythema. No pallor.  Psychiatric: She has a normal mood and affect. Her speech is normal and behavior is normal. Judgment and thought content normal. Cognition and memory are normal.  Nursing note and vitals reviewed.   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 Continue metformin. Continue to monitor. Recheck 3 months.       Relevant Medications   lisinopril  (PRINIVIL,ZESTRIL) 20 MG tablet   metoprolol (LOPRESSOR) 50 MG tablet   furosemide (LASIX) 40 MG tablet   amLODipine (NORVASC) 5 MG tablet   metFORMIN (GLUMETZA) 500 MG (MOD) 24 hr tablet   Paroxysmal atrial fibrillation (HCC) - Primary    Continue to follow with cardiology. Call with any concerns.       Relevant Medications   lisinopril (PRINIVIL,ZESTRIL) 20 MG tablet   metoprolol (LOPRESSOR) 50 MG tablet   furosemide (LASIX) 40 MG tablet   amLODipine (NORVASC) 5 MG tablet     Respiratory   COPD, severe (HCC)    Lungs clear today. Continue current regimen. Refills given today. Call with any concerns.       Relevant Medications   Fluticasone-Salmeterol (ADVAIR) 250-50 MCG/DOSE AEPB   albuterol (PROVENTIL) (2.5 MG/3ML) 0.083% nebulizer solution   tiotropium (SPIRIVA) 18 MCG inhalation capsule     Digestive   GI bleed    Rechecking CBC today. Continue to follow with GI and hematology.        Endocrine   Hypothyroidism   Relevant Medications   metoprolol (LOPRESSOR) 50 MG tablet     Nervous and Auditory   Diabetic polyneuropathy (HCC)   Relevant Medications   donepezil (ARICEPT) 10 MG tablet   lisinopril (PRINIVIL,ZESTRIL) 20 MG tablet   divalproex (DEPAKOTE) 250 MG DR tablet   venlafaxine XR (EFFEXOR XR) 37.5 MG 24 hr capsule   gabapentin (NEURONTIN) 400 MG capsule   metFORMIN (GLUMETZA) 500 MG (MOD) 24 hr tablet   QUEtiapine (SEROQUEL) 25 MG tablet   Dementia with behavioral disturbance    Refill of her aricept given today. Continue to monitor.       Relevant Medications   donepezil (ARICEPT) 10 MG tablet   divalproex (DEPAKOTE) 250 MG DR tablet   venlafaxine XR (EFFEXOR XR) 37.5 MG 24 hr capsule   gabapentin (NEURONTIN) 400 MG capsule   QUEtiapine (SEROQUEL) 25 MG tablet     Genitourinary   Benign hypertensive renal disease    Under good control. Continue current regimen. Continue to monitor. Call with any concerns.         Hematopoietic and Hemostatic    Thrombocytosis ([place])    Rechecking CBC today. Continue to follow with GI and hematology.        Other   Iron deficiency anemia    Rechecking CBC today. Continue to follow with GI and hematology.      Relevant Medications   ferrous sulfate 325 (65 FE) MG tablet   RESOLVED: Anemia   Relevant Medications   ferrous sulfate 325 (65 FE) MG tablet       Follow up plan: Return in about 3 months (around 02/26/2016) for DM visit.

## 2015-11-26 NOTE — Assessment & Plan Note (Signed)
Rechecking CBC today. Continue to follow with GI and hematology.

## 2015-11-26 NOTE — Assessment & Plan Note (Signed)
Refill of her aricept given today. Continue to monitor.

## 2015-11-26 NOTE — Assessment & Plan Note (Signed)
Under good control. Continue current regimen. Continue to monitor. Call with any concerns. 

## 2015-11-26 NOTE — Assessment & Plan Note (Signed)
Continue to follow with cardiology. Call with any concerns.  

## 2015-11-26 NOTE — Assessment & Plan Note (Signed)
Lungs clear today. Continue current regimen. Refills given today. Call with any concerns.

## 2015-11-26 NOTE — Assessment & Plan Note (Signed)
A1c down to 6.7. Continue metformin. Continue to monitor. Recheck 3 months.

## 2015-11-27 ENCOUNTER — Encounter: Payer: Self-pay | Admitting: Family Medicine

## 2015-11-27 LAB — COMPREHENSIVE METABOLIC PANEL
ALK PHOS: 63 IU/L (ref 39–117)
ALT: 35 IU/L — AB (ref 0–32)
AST: 41 IU/L — AB (ref 0–40)
Albumin/Globulin Ratio: 1.3 (ref 1.2–2.2)
Albumin: 3.9 g/dL (ref 3.5–4.7)
BUN/Creatinine Ratio: 23 (ref 12–28)
BUN: 26 mg/dL (ref 8–27)
Bilirubin Total: <0.2 mg/dL (ref 0.0–1.2)
CO2: 26 mmol/L (ref 18–29)
CREATININE: 1.15 mg/dL — AB (ref 0.57–1.00)
Calcium: 8.5 mg/dL — ABNORMAL LOW (ref 8.7–10.3)
Chloride: 100 mmol/L (ref 96–106)
GFR calc Af Amer: 51 mL/min/{1.73_m2} — ABNORMAL LOW (ref >59)
GFR calc non Af Amer: 44 mL/min/{1.73_m2} — ABNORMAL LOW (ref >59)
GLUCOSE: 157 mg/dL — AB (ref 65–99)
Globulin, Total: 2.9 g/dL (ref 1.5–4.5)
Potassium: 4.3 mmol/L (ref 3.5–5.2)
Sodium: 144 mmol/L (ref 134–144)
Total Protein: 6.8 g/dL (ref 6.0–8.5)

## 2015-11-27 LAB — LIPID PANEL W/O CHOL/HDL RATIO
CHOLESTEROL TOTAL: 254 mg/dL — AB (ref 100–199)
HDL: 39 mg/dL — ABNORMAL LOW (ref >39)
LDL CALC: 166 mg/dL — AB (ref 0–99)
TRIGLYCERIDES: 244 mg/dL — AB (ref 0–149)
VLDL CHOLESTEROL CAL: 49 mg/dL — AB (ref 5–40)

## 2015-11-27 LAB — CBC WITH DIFFERENTIAL/PLATELET
BASOS ABS: 0 10*3/uL (ref 0.0–0.2)
BASOS: 0 %
EOS (ABSOLUTE): 0.3 10*3/uL (ref 0.0–0.4)
Eos: 4 %
Hematocrit: 35.2 % (ref 34.0–46.6)
Hemoglobin: 10.8 g/dL — ABNORMAL LOW (ref 11.1–15.9)
IMMATURE GRANS (ABS): 0 10*3/uL (ref 0.0–0.1)
Immature Granulocytes: 0 %
Lymphocytes Absolute: 1.6 10*3/uL (ref 0.7–3.1)
Lymphs: 23 %
MCH: 28.6 pg (ref 26.6–33.0)
MCHC: 30.7 g/dL — ABNORMAL LOW (ref 31.5–35.7)
MCV: 93 fL (ref 79–97)
MONOCYTES: 14 %
MONOS ABS: 0.9 10*3/uL (ref 0.1–0.9)
Neutrophils Absolute: 4 10*3/uL (ref 1.4–7.0)
Neutrophils: 59 %
PLATELETS: 424 10*3/uL — AB (ref 150–379)
RBC: 3.78 x10E6/uL (ref 3.77–5.28)
RDW: 14.9 % (ref 12.3–15.4)
WBC: 6.8 10*3/uL (ref 3.4–10.8)

## 2015-11-27 LAB — TSH: TSH: 2.75 u[IU]/mL (ref 0.450–4.500)

## 2015-12-09 ENCOUNTER — Other Ambulatory Visit: Payer: Self-pay | Admitting: Family Medicine

## 2015-12-09 NOTE — Telephone Encounter (Signed)
I have that she hasn't been on this in months. Please check with family. Thanks

## 2015-12-10 NOTE — Telephone Encounter (Signed)
Spoke with [name].  The pharmacy must have done this automatically. He said she hasn't been on the medication for a while and they didn't request a refill.

## 2015-12-17 ENCOUNTER — Ambulatory Visit: Payer: Self-pay | Admitting: Sports Medicine

## 2015-12-27 ENCOUNTER — Ambulatory Visit (INDEPENDENT_AMBULATORY_CARE_PROVIDER_SITE_OTHER): Payer: Medicare Other | Admitting: Sports Medicine

## 2015-12-27 ENCOUNTER — Encounter: Payer: Self-pay | Admitting: Sports Medicine

## 2015-12-27 DIAGNOSIS — E1142 Type 2 diabetes mellitus with diabetic polyneuropathy: Secondary | ICD-10-CM | POA: Diagnosis not present

## 2015-12-27 DIAGNOSIS — B351 Tinea unguium: Secondary | ICD-10-CM | POA: Diagnosis not present

## 2015-12-27 DIAGNOSIS — L853 Xerosis cutis: Secondary | ICD-10-CM

## 2015-12-27 DIAGNOSIS — I739 Peripheral vascular disease, unspecified: Secondary | ICD-10-CM | POA: Diagnosis not present

## 2015-12-27 DIAGNOSIS — M2040 Other hammer toe(s) (acquired), unspecified foot: Secondary | ICD-10-CM | POA: Diagnosis not present

## 2015-12-27 DIAGNOSIS — D369 Benign neoplasm, unspecified site: Secondary | ICD-10-CM | POA: Insufficient documentation

## 2015-12-27 DIAGNOSIS — M21619 Bunion of unspecified foot: Secondary | ICD-10-CM | POA: Diagnosis not present

## 2015-12-27 DIAGNOSIS — M79675 Pain in left toe(s): Secondary | ICD-10-CM | POA: Diagnosis not present

## 2015-12-27 DIAGNOSIS — M79674 Pain in right toe(s): Secondary | ICD-10-CM

## 2015-12-27 NOTE — Progress Notes (Signed)
Patient ID: Theresa Vasquez, female   DOB: 09-Feb-1931, 82 y.o.   MRN: [id number] Subjective: Theresa Vasquez is a 82 y.o. female patient with history of diabetes who presents to office today complaining of long, painful nails  while ambulating in shoes; unable to trim. Patient is assisted by son who states that blood sugar ranges 150-160mg /dl. Patient denies any new changes in medication or new problems. Patient denies any new cramping, numbness, burning or tingling in the legs.  Patient Active Problem List   Diagnosis Date Noted  . Benign neoplasm 12/27/2015  . Essential hypertension, malignant 07/22/2015  . Dementia with behavioral disturbance 07/19/2015  . PNA (pneumonia) 07/18/2015  . Acute diastolic CHF (congestive heart failure) ([place])   . Paroxysmal atrial fibrillation ([place]) 05/28/2015  . Acute GI bleeding   . PVD (peripheral vascular disease) ([place]) 05/14/2015  . Type 2 DM with CKD stage 2 and hypertension ([place]) 05/14/2015  . COPD, severe ([place]) 05/14/2015  . History of pneumonia 05/14/2015  . GI bleed 05/14/2015  . Benign hypertensive renal disease 05/14/2015  . Vascular dementia 05/14/2015  . Anxiety disorder 05/14/2015  . Edema 05/14/2015  . Diabetic polyneuropathy ([place]) 05/14/2015  . Diverticulosis 05/14/2015  . Abnormality of gait 05/14/2015  . Osteoporosis 05/14/2015  . Thrombocytosis ([place]) 05/14/2015  . Hypothyroidism 05/14/2015  . Barrett's esophagus 05/14/2015  . Drug addiction in remission ([place]) 05/14/2015  . Mitral regurgitation 05/14/2015  . LBBB (left bundle branch block) 05/14/2015  . Carotid bruit 05/14/2015  . Ischemic colitis ([place]) 05/14/2015  . History of peptic ulcer disease 05/14/2015  . CHF (congestive heart failure) ([place]) 05/14/2015  . Atherosclerosis of aorta ([employer]) 05/14/2015  . Involutional osteoporosis 05/14/2015  . Barrett esophagus 05/14/2015  . Chronic obstructive pulmonary disease ([place]) 05/14/2015  . Diverticulitis 05/14/2015  . Essential  thrombocythemia ([place]) 05/14/2015  . Bleeding gastrointestinal 05/14/2015  . Heart failure ([place]) 05/14/2015  . H/O recurrent pneumonia 05/14/2015  . Heart & renal disease, hypertensive, with heart failure ([place]) 05/14/2015  . Adult hypothyroidism 05/14/2015  . Hypoxemia 05/14/2015  . Block, bundle branch, left 05/14/2015  . Non-rheumatic mitral regurgitation 05/14/2015  . Other specified symptoms and signs involving the circulatory and respiratory systems 05/14/2015  . Psychoactive substance dependence ([place]) 05/14/2015  . Peripheral vascular disease ([place]) 05/14/2015  . Vascular dementia with behavioral disturbance 05/14/2015  . Vascular disorder of intestine ([place]) 05/14/2015  . Iron deficiency anemia 11/24/2014  . Chronic systolic heart failure ([place]) 06/05/2014  . Type 2 diabetes mellitus ([place]) 06/05/2014  . High anion gap metabolic acidosis [id number]  . Acidosis, lactic 06/05/2014  . Lung edema 06/05/2014  . Alveolar hypoventilation 06/05/2014  . Arthritis, degenerative 02/20/2014   Current Outpatient Prescriptions on File Prior to Visit  Medication Sig Dispense Refill  . albuterol (PROVENTIL) (2.5 MG/3ML) 0.083% nebulizer solution Take 3 mLs (2.5 mg total) by nebulization every 4 (four) hours as needed for wheezing or shortness of breath. 75 mL 6  . amiodarone (PACERONE) 200 MG tablet Take 200 mg by mouth daily.     Marland Kitchen amLODipine (NORVASC) 5 MG tablet Take 1 tablet (5 mg total) by mouth 2 (two) times daily. 180 tablet 1  . aspirin EC 81 MG tablet Take by mouth.    . Cholecalciferol (VITAMIN D-3) 1000 units CAPS Take 1 capsule (1,000 Units total) by mouth daily. 90 capsule 3  . divalproex (DEPAKOTE) 250 MG DR tablet Take 1 tablet (250 mg total) by mouth 3 (three) times daily. 270 tablet 1  .  donepezil (ARICEPT) 10 MG tablet Take 1 tablet (10 mg total) by mouth at bedtime. 90 tablet 1  . ferrous sulfate 325 (65 FE) MG tablet Take 1 tablet (325 mg total) by mouth 2 (two) times daily with  a meal. 180 tablet 1  . Fluticasone-Salmeterol (ADVAIR) 250-50 MCG/DOSE AEPB Inhale 1 puff into the lungs 2 (two) times daily. 180 each 1  . furosemide (LASIX) 40 MG tablet Take 1 tablet (40 mg total) by mouth 2 (two) times daily. 180 tablet 1  . gabapentin (NEURONTIN) 400 MG capsule Take 1 capsule (400 mg total) by mouth 2 (two) times daily. 180 capsule 1  . levothyroxine (SYNTHROID, LEVOTHROID) 150 MCG tablet Take 1 tablet (150 mcg total) by mouth daily before breakfast. 90 tablet 3  . lisinopril (PRINIVIL,ZESTRIL) 20 MG tablet Take 1 tablet (20 mg total) by mouth 2 (two) times daily. 180 tablet 1  . Melatonin 3 MG TABS Take 1 tablet (3 mg total) by mouth at bedtime. 1 tablet 12  . metFORMIN (GLUMETZA) 500 MG (MOD) 24 hr tablet Take 1 tablet (500 mg total) by mouth daily with breakfast. 90 tablet 1  . metoprolol (LOPRESSOR) 50 MG tablet Take 0.5 tablets (25 mg total) by mouth 2 (two) times daily. 90 tablet 1  . pantoprazole (PROTONIX) 40 MG tablet Take 1 tablet (40 mg total) by mouth 2 (two) times daily. 180 tablet 1  . QUEtiapine (SEROQUEL) 25 MG tablet Take 1 tablet (25 mg total) by mouth at bedtime. 90 tablet 1  . tiotropium (SPIRIVA) 18 MCG inhalation capsule Place 1 capsule (18 mcg total) into inhaler and inhale daily. 90 capsule 1  . venlafaxine XR (EFFEXOR XR) 37.5 MG 24 hr capsule Take 1 capsule (37.5 mg total) by mouth daily with breakfast. 90 capsule 1   No current facility-administered medications on file prior to visit.   No Known Allergies  Recent Results (from the past 2160 hour(s))  Bayer DCA Hb A1c Waived     Status: None   Collection Time: 11/26/15  1:40 PM  Result Value Ref Range   Bayer DCA Hb A1c Waived 6.7 <7.0 %    Comment:                                       Diabetic Adult            <7.0                                       Healthy Adult        4.3 - 5.7                                                           (DCCT/NGSP) American Diabetes Association's Summary  of Glycemic Recommendations for Adults with Diabetes: Hemoglobin A1c <7.0%. More stringent glycemic goals (A1c <6.0%) may further reduce complications at the cost of increased risk of hypoglycemia.   CBC with Differential/Platelet     Status: Abnormal   Collection Time: 11/26/15  1:40 PM  Result Value Ref Range   WBC 6.8 3.4 - 10.8 x10E3/uL   RBC  3.78 3.77 - 5.28 x10E6/uL   Hemoglobin 10.8 (L) 11.1 - 15.9 g/dL   Hematocrit 35.2 34.0 - 46.6 %   MCV 93 79 - 97 fL   MCH 28.6 26.6 - 33.0 pg   MCHC 30.7 (L) 31.5 - 35.7 g/dL   RDW 14.9 12.3 - 15.4 %   Platelets 424 (H) 150 - 379 x10E3/uL   Neutrophils 59 %   Lymphs 23 %   Monocytes 14 %   Eos 4 %   Basos 0 %   Neutrophils Absolute 4.0 1.4 - 7.0 x10E3/uL   Lymphocytes Absolute 1.6 0.7 - 3.1 x10E3/uL   Monocytes Absolute 0.9 0.1 - 0.9 x10E3/uL   EOS (ABSOLUTE) 0.3 0.0 - 0.4 x10E3/uL   Basophils Absolute 0.0 0.0 - 0.2 x10E3/uL   Immature Granulocytes 0 %   Immature Grans (Abs) 0.0 0.0 - 0.1 x10E3/uL  Comprehensive metabolic panel     Status: Abnormal   Collection Time: 11/26/15  1:40 PM  Result Value Ref Range   Glucose 157 (H) 65 - 99 mg/dL   BUN 26 8 - 27 mg/dL   Creatinine, Ser 1.15 (H) 0.57 - 1.00 mg/dL   GFR calc non Af Amer 44 (L) >59 mL/min/1.73   GFR calc Af Amer 51 (L) >59 mL/min/1.73   BUN/Creatinine Ratio 23 12 - 28   Sodium 144 134 - 144 mmol/L   Potassium 4.3 3.5 - 5.2 mmol/L   Chloride 100 96 - 106 mmol/L   CO2 26 18 - 29 mmol/L   Calcium 8.5 (L) 8.7 - 10.3 mg/dL   Total Protein 6.8 6.0 - 8.5 g/dL   Albumin 3.9 3.5 - 4.7 g/dL   Globulin, Total 2.9 1.5 - 4.5 g/dL   Albumin/Globulin Ratio 1.3 1.2 - 2.2   Bilirubin Total <0.2 0.0 - 1.2 mg/dL   Alkaline Phosphatase 63 39 - 117 IU/L   AST 41 (H) 0 - 40 IU/L   ALT 35 (H) 0 - 32 IU/L  Lipid Panel w/o Chol/HDL Ratio     Status: Abnormal   Collection Time: 11/26/15  1:40 PM  Result Value Ref Range   Cholesterol, Total 254 (H) 100 - 199 mg/dL   Triglycerides 244 (H)  0 - 149 mg/dL   HDL 39 (L) >39 mg/dL   VLDL Cholesterol Cal 49 (H) 5 - 40 mg/dL   LDL Calculated 166 (H) 0 - 99 mg/dL  TSH     Status: None   Collection Time: 11/26/15  1:40 PM  Result Value Ref Range   TSH 2.750 0.450 - 4.500 uIU/mL    Objective: General: Patient is awake, alert, and oriented x 3 and in no acute distress.  Integument: Skin is warm, extremely dry and supple bilateral. Nails are tender, long, thickened and  dystrophic with subungual debris, consistent with onychomycosis, 1-5 bilateral. No signs of infection. No open lesions or preulcerative lesions present bilateral. Remaining integument unremarkable.  Vasculature:  Dorsalis Pedis pulse 1/4 bilateral. Posterior Tibial pulse  0/4 bilateral.  Capillary fill time <5 sec 1-5 bilateral. No hair growth to the level of the digits. Temperature gradient within normal limits. + varicosities present bilateral. Trace edema present bilateral.   Neurology: The patient has absent sensation measured with a 5.07/10g Semmes Weinstein Monofilament at all pedal sites bilateral . Vibratory sensation diminished bilateral with tuning fork. No Babinski sign present bilateral.   Musculoskeletal: Asymptomatic bunion and hammertoe pedal deformities noted bilateral. Muscular strength acceptable without pain on range of motion . No  tenderness with calf compression bilateral.  Assessment and Plan: Problem List Items Addressed This Visit      Cardiovascular and Mediastinum   PVD (peripheral vascular disease) ([place])     Nervous and Auditory   Diabetic polyneuropathy ([place])    Other Visit Diagnoses    Dermatophytosis of nail    -  Primary    Toe pain, bilateral        Bunion        Hammertoe, unspecified laterality        Dry skin          -Examined patient. -Discussed and educated patient on diabetic foot care, especially with  regards to the vascular, neurological and musculoskeletal systems.  -Stressed the importance of good glycemic  control and the detriment of not  controlling glucose levels in relation to the foot. -Mechanically debrided all nails 1-5 bilateral using sterile nail nipper and filed with dremel without incident  -Encouraged skin emollients daily -Encouraged lower limb elevation when sitting -Advised good supportive shoes daily for foot type -Answered all patient questions -Patient to return  in 3 months for at risk foot care -Patient advised to call the office if any problems or questions arise in the meantime.  Landis Martins, DPM

## 2016-02-27 ENCOUNTER — Ambulatory Visit: Payer: Medicare Other | Admitting: Family Medicine

## 2016-04-03 ENCOUNTER — Ambulatory Visit: Payer: Medicare Other | Admitting: Sports Medicine

## 2016-04-10 ENCOUNTER — Other Ambulatory Visit: Payer: Self-pay | Admitting: Family Medicine

## 2016-04-17 ENCOUNTER — Ambulatory Visit: Payer: Medicare Other | Admitting: Podiatry

## 2016-05-12 ENCOUNTER — Other Ambulatory Visit: Payer: Self-pay | Admitting: Family Medicine

## 2016-05-12 NOTE — Telephone Encounter (Signed)
Pt scheduled for 05/19/16. Pt needs a refill on this medication ASAP. Pharm is [facility] in [place]. Thanks.

## 2016-05-12 NOTE — Telephone Encounter (Signed)
Needs appointment scheduled please

## 2016-05-18 ENCOUNTER — Emergency Department: Payer: Medicare Other

## 2016-05-18 ENCOUNTER — Encounter: Payer: Self-pay | Admitting: Emergency Medicine

## 2016-05-18 ENCOUNTER — Inpatient Hospital Stay
Admission: EM | Admit: 2016-05-18 | Discharge: 2016-05-20 | DRG: 689 | Disposition: A | Discharge status: Home Health | Payer: Medicare Other | Attending: Internal Medicine | Admitting: Internal Medicine

## 2016-05-18 DIAGNOSIS — F419 Anxiety disorder, unspecified: Secondary | ICD-10-CM | POA: Diagnosis present

## 2016-05-18 DIAGNOSIS — I340 Nonrheumatic mitral (valve) insufficiency: Secondary | ICD-10-CM | POA: Diagnosis present

## 2016-05-18 DIAGNOSIS — Z8701 Personal history of pneumonia (recurrent): Secondary | ICD-10-CM | POA: Diagnosis not present

## 2016-05-18 DIAGNOSIS — R531 Weakness: Secondary | ICD-10-CM

## 2016-05-18 DIAGNOSIS — Z23 Encounter for immunization: Secondary | ICD-10-CM | POA: Diagnosis not present

## 2016-05-18 DIAGNOSIS — E1122 Type 2 diabetes mellitus with diabetic chronic kidney disease: Secondary | ICD-10-CM | POA: Diagnosis present

## 2016-05-18 DIAGNOSIS — E1142 Type 2 diabetes mellitus with diabetic polyneuropathy: Secondary | ICD-10-CM | POA: Diagnosis present

## 2016-05-18 DIAGNOSIS — I447 Left bundle-branch block, unspecified: Secondary | ICD-10-CM | POA: Diagnosis present

## 2016-05-18 DIAGNOSIS — E039 Hypothyroidism, unspecified: Secondary | ICD-10-CM | POA: Diagnosis present

## 2016-05-18 DIAGNOSIS — Z7982 Long term (current) use of aspirin: Secondary | ICD-10-CM

## 2016-05-18 DIAGNOSIS — Z9981 Dependence on supplemental oxygen: Secondary | ICD-10-CM | POA: Diagnosis not present

## 2016-05-18 DIAGNOSIS — K219 Gastro-esophageal reflux disease without esophagitis: Secondary | ICD-10-CM | POA: Diagnosis present

## 2016-05-18 DIAGNOSIS — F17210 Nicotine dependence, cigarettes, uncomplicated: Secondary | ICD-10-CM | POA: Diagnosis present

## 2016-05-18 DIAGNOSIS — Z8711 Personal history of peptic ulcer disease: Secondary | ICD-10-CM

## 2016-05-18 DIAGNOSIS — R262 Difficulty in walking, not elsewhere classified: Secondary | ICD-10-CM

## 2016-05-18 DIAGNOSIS — N3001 Acute cystitis with hematuria: Secondary | ICD-10-CM | POA: Diagnosis present

## 2016-05-18 DIAGNOSIS — Z7984 Long term (current) use of oral hypoglycemic drugs: Secondary | ICD-10-CM

## 2016-05-18 DIAGNOSIS — I129 Hypertensive chronic kidney disease with stage 1 through stage 4 chronic kidney disease, or unspecified chronic kidney disease: Secondary | ICD-10-CM | POA: Diagnosis present

## 2016-05-18 DIAGNOSIS — N390 Urinary tract infection, site not specified: Secondary | ICD-10-CM

## 2016-05-18 DIAGNOSIS — R109 Unspecified abdominal pain: Secondary | ICD-10-CM

## 2016-05-18 DIAGNOSIS — J9621 Acute and chronic respiratory failure with hypoxia: Secondary | ICD-10-CM | POA: Diagnosis present

## 2016-05-18 DIAGNOSIS — E785 Hyperlipidemia, unspecified: Secondary | ICD-10-CM | POA: Diagnosis present

## 2016-05-18 DIAGNOSIS — F0150 Vascular dementia without behavioral disturbance: Secondary | ICD-10-CM | POA: Diagnosis present

## 2016-05-18 DIAGNOSIS — E1151 Type 2 diabetes mellitus with diabetic peripheral angiopathy without gangrene: Secondary | ICD-10-CM | POA: Diagnosis present

## 2016-05-18 DIAGNOSIS — N183 Chronic kidney disease, stage 3 (moderate): Secondary | ICD-10-CM | POA: Diagnosis present

## 2016-05-18 DIAGNOSIS — F329 Major depressive disorder, single episode, unspecified: Secondary | ICD-10-CM | POA: Diagnosis present

## 2016-05-18 DIAGNOSIS — G9341 Metabolic encephalopathy: Secondary | ICD-10-CM | POA: Diagnosis present

## 2016-05-18 DIAGNOSIS — M810 Age-related osteoporosis without current pathological fracture: Secondary | ICD-10-CM | POA: Diagnosis present

## 2016-05-18 DIAGNOSIS — J449 Chronic obstructive pulmonary disease, unspecified: Secondary | ICD-10-CM | POA: Diagnosis present

## 2016-05-18 DIAGNOSIS — M6281 Muscle weakness (generalized): Secondary | ICD-10-CM

## 2016-05-18 DIAGNOSIS — I480 Paroxysmal atrial fibrillation: Secondary | ICD-10-CM | POA: Diagnosis present

## 2016-05-18 DIAGNOSIS — R2681 Unsteadiness on feet: Secondary | ICD-10-CM

## 2016-05-18 DIAGNOSIS — J849 Interstitial pulmonary disease, unspecified: Secondary | ICD-10-CM | POA: Diagnosis present

## 2016-05-18 DIAGNOSIS — R10A0 Flank pain, unspecified side: Secondary | ICD-10-CM

## 2016-05-18 DIAGNOSIS — Z79899 Other long term (current) drug therapy: Secondary | ICD-10-CM

## 2016-05-18 DIAGNOSIS — Z833 Family history of diabetes mellitus: Secondary | ICD-10-CM

## 2016-05-18 DIAGNOSIS — R0902 Hypoxemia: Secondary | ICD-10-CM

## 2016-05-18 LAB — CBC WITH DIFFERENTIAL/PLATELET
BASOS ABS: 0 10*3/uL (ref 0–0.1)
Basophils Relative: 1 %
EOS ABS: 0 10*3/uL (ref 0–0.7)
EOS PCT: 1 %
HCT: 33.3 % — ABNORMAL LOW (ref 35.0–47.0)
HEMOGLOBIN: 11.2 g/dL — AB (ref 12.0–16.0)
Lymphocytes Relative: 8 %
Lymphs Abs: 0.6 10*3/uL — ABNORMAL LOW (ref 1.0–3.6)
MCH: 30.4 pg (ref 26.0–34.0)
MCHC: 33.6 g/dL (ref 32.0–36.0)
MCV: 90.6 fL (ref 80.0–100.0)
Monocytes Absolute: 1.2 10*3/uL — ABNORMAL HIGH (ref 0.2–0.9)
Monocytes Relative: 16 %
NEUTROS PCT: 74 %
Neutro Abs: 5.3 10*3/uL (ref 1.4–6.5)
PLATELETS: 338 10*3/uL (ref 150–440)
RBC: 3.67 MIL/uL — AB (ref 3.80–5.20)
RDW: 14.4 % (ref 11.5–14.5)
WBC: 7.2 10*3/uL (ref 3.6–11.0)

## 2016-05-18 LAB — URINALYSIS COMPLETE WITH MICROSCOPIC (ARMC ONLY)
Bilirubin Urine: NEGATIVE
Glucose, UA: NEGATIVE mg/dL
KETONES UR: NEGATIVE mg/dL
Nitrite: NEGATIVE
PH: 7 (ref 5.0–8.0)
PROTEIN: 30 mg/dL — AB
Specific Gravity, Urine: 1.013 (ref 1.005–1.030)

## 2016-05-18 LAB — COMPREHENSIVE METABOLIC PANEL
ALBUMIN: 3.3 g/dL — AB (ref 3.5–5.0)
ALK PHOS: 56 U/L (ref 38–126)
ALT: 30 U/L (ref 14–54)
AST: 71 U/L — AB (ref 15–41)
Anion gap: 9 (ref 5–15)
BUN: 19 mg/dL (ref 6–20)
CHLORIDE: 96 mmol/L — AB (ref 101–111)
CO2: 31 mmol/L (ref 22–32)
CREATININE: 0.81 mg/dL (ref 0.44–1.00)
Calcium: 8.5 mg/dL — ABNORMAL LOW (ref 8.9–10.3)
GFR calc Af Amer: >60 mL/min (ref >60)
GFR calc non Af Amer: >60 mL/min (ref >60)
GLUCOSE: 143 mg/dL — AB (ref 65–99)
Potassium: 3.6 mmol/L (ref 3.5–5.1)
SODIUM: 136 mmol/L (ref 135–145)
Total Bilirubin: 0.3 mg/dL (ref 0.3–1.2)
Total Protein: 7.1 g/dL (ref 6.5–8.1)

## 2016-05-18 LAB — TROPONIN I: Troponin I: <0.03 ng/mL (ref <0.03)

## 2016-05-18 LAB — GLUCOSE, CAPILLARY: GLUCOSE-CAPILLARY: 179 mg/dL — AB (ref 65–99)

## 2016-05-18 LAB — LACTIC ACID, PLASMA
LACTIC ACID, VENOUS: 1.7 mmol/L (ref 0.5–1.9)
Lactic Acid, Venous: 1.2 mmol/L (ref 0.5–1.9)

## 2016-05-18 MED ORDER — METOPROLOL TARTRATE 25 MG PO TABS
25.0000 mg | ORAL_TABLET | Freq: Two times a day (BID) | ORAL | Status: DC
  Administered 2016-05-18 – 2016-05-20 (×4): 25 mg via ORAL
  Filled 2016-05-18 (×4): qty 1

## 2016-05-18 MED ORDER — ACETAMINOPHEN 325 MG PO TABS
650.0000 mg | ORAL_TABLET | Freq: Four times a day (QID) | ORAL | Status: DC | PRN
  Administered 2016-05-18 – 2016-05-20 (×2): 650 mg via ORAL
  Filled 2016-05-18 (×2): qty 2

## 2016-05-18 MED ORDER — MOMETASONE FURO-FORMOTEROL FUM 200-5 MCG/ACT IN AERO
2.0000 | INHALATION_SPRAY | Freq: Two times a day (BID) | RESPIRATORY_TRACT | Status: DC
  Administered 2016-05-18 – 2016-05-20 (×4): 2 via RESPIRATORY_TRACT
  Filled 2016-05-18: qty 8.8

## 2016-05-18 MED ORDER — AMIODARONE HCL 200 MG PO TABS
100.0000 mg | ORAL_TABLET | Freq: Every day | ORAL | Status: DC
  Administered 2016-05-18 – 2016-05-20 (×3): 100 mg via ORAL
  Filled 2016-05-18 (×4): qty 1

## 2016-05-18 MED ORDER — DEXTROSE 5 % IV SOLN: 1.0000 g | Freq: Once | INTRAVENOUS | Status: DC

## 2016-05-18 MED ORDER — OXYCODONE HCL 5 MG PO TABS
5.0000 mg | ORAL_TABLET | Freq: Four times a day (QID) | ORAL | Status: DC | PRN
  Administered 2016-05-19: 04:00:00 5 mg via ORAL
  Filled 2016-05-18 (×2): qty 1

## 2016-05-18 MED ORDER — FERROUS SULFATE 325 (65 FE) MG PO TABS
325.0000 mg | ORAL_TABLET | Freq: Every day | ORAL | Status: DC
  Administered 2016-05-18 – 2016-05-20 (×3): 325 mg via ORAL
  Filled 2016-05-18 (×3): qty 1

## 2016-05-18 MED ORDER — DONEPEZIL HCL 5 MG PO TABS
10.0000 mg | ORAL_TABLET | Freq: Every day | ORAL | Status: DC
  Administered 2016-05-18 – 2016-05-19 (×2): 10 mg via ORAL
  Filled 2016-05-18 (×2): qty 2

## 2016-05-18 MED ORDER — PANTOPRAZOLE SODIUM 40 MG PO TBEC
40.0000 mg | DELAYED_RELEASE_TABLET | Freq: Two times a day (BID) | ORAL | Status: DC
  Administered 2016-05-18 – 2016-05-20 (×4): 40 mg via ORAL
  Filled 2016-05-18 (×4): qty 1

## 2016-05-18 MED ORDER — GABAPENTIN 400 MG PO CAPS
400.0000 mg | ORAL_CAPSULE | Freq: Two times a day (BID) | ORAL | Status: DC
  Administered 2016-05-18 – 2016-05-20 (×5): 400 mg via ORAL
  Filled 2016-05-18 (×5): qty 1

## 2016-05-18 MED ORDER — AMLODIPINE BESYLATE 5 MG PO TABS
5.0000 mg | ORAL_TABLET | Freq: Two times a day (BID) | ORAL | Status: DC
  Administered 2016-05-19 – 2016-05-20 (×3): 5 mg via ORAL
  Filled 2016-05-18 (×3): qty 1

## 2016-05-18 MED ORDER — VITAMIN D 1000 UNITS PO TABS
1000.0000 [IU] | ORAL_TABLET | Freq: Every day | ORAL | Status: DC
  Administered 2016-05-18 – 2016-05-20 (×3): 1000 [IU] via ORAL
  Filled 2016-05-18 (×5): qty 1

## 2016-05-18 MED ORDER — INSULIN ASPART 100 UNIT/ML ~~LOC~~ SOLN: 0.0000 [IU] | Freq: Every day | SUBCUTANEOUS | Status: DC

## 2016-05-18 MED ORDER — MELATONIN 5 MG PO TABS
5.0000 mg | ORAL_TABLET | Freq: Every day | ORAL | Status: DC
  Administered 2016-05-18 – 2016-05-19 (×2): 5 mg via ORAL
  Filled 2016-05-18 (×2): qty 1

## 2016-05-18 MED ORDER — VENLAFAXINE HCL ER 37.5 MG PO CP24
37.5000 mg | ORAL_CAPSULE | Freq: Every day | ORAL | Status: DC
  Administered 2016-05-18 – 2016-05-20 (×3): 37.5 mg via ORAL
  Filled 2016-05-18 (×3): qty 1

## 2016-05-18 MED ORDER — FUROSEMIDE 40 MG PO TABS
40.0000 mg | ORAL_TABLET | Freq: Two times a day (BID) | ORAL | Status: DC
  Administered 2016-05-18 – 2016-05-20 (×4): 40 mg via ORAL
  Filled 2016-05-18 (×4): qty 1

## 2016-05-18 MED ORDER — SODIUM CHLORIDE 0.9 % IV SOLN
1000.0000 mL | Freq: Once | INTRAVENOUS | Status: AC
Start: 2016-05-18 — End: 2016-05-18
  Administered 2016-05-18: 1000 mL via INTRAVENOUS

## 2016-05-18 MED ORDER — ASPIRIN EC 81 MG PO TBEC
81.0000 mg | DELAYED_RELEASE_TABLET | Freq: Every day | ORAL | Status: DC
  Administered 2016-05-18 – 2016-05-20 (×3): 81 mg via ORAL
  Filled 2016-05-18 (×3): qty 1

## 2016-05-18 MED ORDER — TIOTROPIUM BROMIDE MONOHYDRATE 18 MCG IN CAPS
18.0000 ug | ORAL_CAPSULE | Freq: Every day | RESPIRATORY_TRACT | Status: DC
  Administered 2016-05-18 – 2016-05-20 (×3): 18 ug via RESPIRATORY_TRACT
  Filled 2016-05-18: qty 5

## 2016-05-18 MED ORDER — INFLUENZA VAC SPLIT QUAD 0.5 ML IM SUSY
0.5000 mL | PREFILLED_SYRINGE | INTRAMUSCULAR | Status: AC
  Administered 2016-05-19: 0.5 mL via INTRAMUSCULAR
  Filled 2016-05-18: qty 0.5

## 2016-05-18 MED ORDER — DEXTROSE 5 % IV SOLN: 1.0000 g | INTRAVENOUS | Status: DC

## 2016-05-18 MED ORDER — ENOXAPARIN SODIUM 40 MG/0.4ML ~~LOC~~ SOLN
40.0000 mg | SUBCUTANEOUS | Status: DC
  Administered 2016-05-18 – 2016-05-19 (×2): 40 mg via SUBCUTANEOUS
  Filled 2016-05-18 (×2): qty 0.4

## 2016-05-18 MED ORDER — LISINOPRIL 20 MG PO TABS
20.0000 mg | ORAL_TABLET | Freq: Two times a day (BID) | ORAL | Status: DC
  Administered 2016-05-18 – 2016-05-19 (×2): 20 mg via ORAL
  Filled 2016-05-18 (×2): qty 1

## 2016-05-18 MED ORDER — CEFTRIAXONE SODIUM 1 G IJ SOLR: 1.0000 g | Freq: Once | INTRAMUSCULAR | Status: DC

## 2016-05-18 MED ORDER — ALBUTEROL SULFATE (2.5 MG/3ML) 0.083% IN NEBU: 2.5000 mg | INHALATION_SOLUTION | RESPIRATORY_TRACT | Status: DC | PRN

## 2016-05-18 MED ORDER — QUETIAPINE FUMARATE 25 MG PO TABS
25.0000 mg | ORAL_TABLET | Freq: Every day | ORAL | Status: DC
  Administered 2016-05-18 – 2016-05-19 (×2): 25 mg via ORAL
  Filled 2016-05-18 (×2): qty 1

## 2016-05-18 MED ORDER — ACETAMINOPHEN 650 MG RE SUPP: 650.0000 mg | Freq: Four times a day (QID) | RECTAL | Status: DC | PRN

## 2016-05-18 MED ORDER — INSULIN ASPART 100 UNIT/ML ~~LOC~~ SOLN
0.0000 [IU] | Freq: Three times a day (TID) | SUBCUTANEOUS | Status: DC
  Administered 2016-05-18: 17:00:00 2 [IU] via SUBCUTANEOUS
  Administered 2016-05-19 – 2016-05-20 (×3): 1 [IU] via SUBCUTANEOUS
  Filled 2016-05-18: qty 2
  Filled 2016-05-18 (×3): qty 1

## 2016-05-18 MED ORDER — LEVOTHYROXINE SODIUM 150 MCG PO TABS
150.0000 ug | ORAL_TABLET | Freq: Every day | ORAL | Status: DC
  Administered 2016-05-19 – 2016-05-20 (×2): 150 ug via ORAL
  Filled 2016-05-18 (×2): qty 1

## 2016-05-18 MED ORDER — DIVALPROEX SODIUM 250 MG PO DR TAB
250.0000 mg | DELAYED_RELEASE_TABLET | Freq: Three times a day (TID) | ORAL | Status: DC
  Administered 2016-05-18 – 2016-05-20 (×6): 250 mg via ORAL
  Filled 2016-05-18 (×7): qty 1

## 2016-05-18 MED ORDER — CEFTRIAXONE SODIUM-DEXTROSE 1-3.74 GM-% IV SOLR
1.0000 g | Freq: Once | INTRAVENOUS | Status: AC
  Administered 2016-05-18: 1 g via INTRAVENOUS
  Filled 2016-05-18: qty 50

## 2016-05-18 MED ORDER — CEFTRIAXONE SODIUM-DEXTROSE 1-3.74 GM-% IV SOLR
1.0000 g | INTRAVENOUS | Status: DC
  Administered 2016-05-19 – 2016-05-20 (×2): 1 g via INTRAVENOUS
  Filled 2016-05-18 (×2): qty 50

## 2016-05-18 NOTE — ED Provider Notes (Signed)
[facility] Emergency Department Provider Note     L5 caveat: Review of systems and history is limited by dementia.   Time seen: ----------------------------------------- 9:54 AM on 05/18/2016 -----------------------------------------    I have reviewed the triage vital signs and the nursing notes.   HISTORY  Chief Complaint Flank Pain    HPI Theresa Vasquez is a 82 y.o. female presents to the ER for lethargy.EMS reports with a caregiver had noticed generalized weakness and that the patient was lethargic. She had been intermittent in complaining of some right flank pain. She does not normally need oxygen but oxygen saturations were 88% on room air. She was placed on oxygen on arrival here. No further information is available.   Past Medical History:  Diagnosis Date  . Adenomatous polyps   . Age-related osteoporosis without current pathological fracture   . Anemia   . Anxiety   . Atherosclerosis of aorta ([place])   . Atrial fibrillation with RVR ([place])   . Barrett's esophagus without dysplasia   . CHF (congestive heart failure) ([place])   . Colitis   . COPD (chronic obstructive pulmonary disease) ([place])   . Dementia   . Depression   . Diabetes mellitus type II, uncontrolled ([place])   . Diverticulitis   . Diverticulosis of intestine without perforation or abscess without bleeding   . Edema   . Essential hemorrhagic thrombocythemia ([place])   . Gastrointestinal hemorrhage   . High anion gap metabolic acidosis   . Hyperlipidemia   . Hypertension   . Hypertensive chronic kidney disease with stage 1 through stage 4 chronic kidney disease, or unspecified chronic kidney disease   . Hypothyroidism   . Hypoxemia   . Iron deficiency anemia   . Lactic acidosis   . Left bundle branch block   . Nonrheumatic mitral valve insufficiency   . Osteoarthritis   . Other psychoactive substance dependence, in remission ([place])   . Other specified symptoms and signs  involving the circulatory and respiratory systems   . Paroxysmal atrial fibrillation (HCC)    s/p cardioversion  . Personal history of peptic ulcer disease   . Pulmonary edema   . PVD (peripheral vascular disease) ([place]) 05/14/2015  . Recurrent pneumonia    history of  . Respiratory failure with hypoxia and hypercapnia ([place])   . Type 2 diabetes mellitus with diabetic chronic kidney disease ([place])   . Type 2 diabetes mellitus with diabetic polyneuropathy ([place])   . Vascular dementia without behavioral disturbance   . Vascular disorder of intestine [facility])     Patient Active Problem List   Diagnosis Date Noted  . Benign neoplasm 12/27/2015  . Essential hypertension, malignant 07/22/2015  . Dementia with behavioral disturbance 07/19/2015  . PNA (pneumonia) 07/18/2015  . Acute diastolic CHF (congestive heart failure) ([place])   . Paroxysmal atrial fibrillation ([place]) 05/28/2015  . Acute GI bleeding   . PVD (peripheral vascular disease) ([place]) 05/14/2015  . Type 2 DM with CKD stage 2 and hypertension ([place]) 05/14/2015  . COPD, severe ([place]) 05/14/2015  . History of pneumonia 05/14/2015  . GI bleed 05/14/2015  . Benign hypertensive renal disease 05/14/2015  . Vascular dementia 05/14/2015  . Anxiety disorder 05/14/2015  . Edema 05/14/2015  . Diabetic polyneuropathy ([place]) 05/14/2015  . Diverticulosis 05/14/2015  . Abnormality of gait 05/14/2015  . Osteoporosis 05/14/2015  . Thrombocytosis ([place]) 05/14/2015  . Hypothyroidism 05/14/2015  . Barrett's esophagus 05/14/2015  . Drug addiction in remission ([place]) 05/14/2015  . Mitral  regurgitation 05/14/2015  . LBBB (left bundle branch block) 05/14/2015  . Carotid bruit 05/14/2015  . Ischemic colitis ([place]) 05/14/2015  . History of peptic ulcer disease 05/14/2015  . CHF (congestive heart failure) ([place]) 05/14/2015  . Atherosclerosis of aorta ([place]) 05/14/2015  . Involutional osteoporosis 05/14/2015  . Barrett esophagus 05/14/2015  . Chronic  obstructive pulmonary disease ([place]) 05/14/2015  . Diverticulitis 05/14/2015  . Essential thrombocythemia ([place]) 05/14/2015  . Bleeding gastrointestinal 05/14/2015  . Heart failure ([place]) 05/14/2015  . H/O recurrent pneumonia 05/14/2015  . Heart & renal disease, hypertensive, with heart failure ([place]) 05/14/2015  . Adult hypothyroidism 05/14/2015  . Hypoxemia 05/14/2015  . Block, bundle branch, left 05/14/2015  . Non-rheumatic mitral regurgitation 05/14/2015  . Other specified symptoms and signs involving the circulatory and respiratory systems 05/14/2015  . Psychoactive substance dependence ([place]) 05/14/2015  . Peripheral vascular disease ([place]) 05/14/2015  . Vascular dementia with behavioral disturbance 05/14/2015  . Vascular disorder of intestine ([place]) 05/14/2015  . Iron deficiency anemia 11/24/2014  . Chronic systolic heart failure ([place]) 06/05/2014  . Type 2 diabetes mellitus ([place]) 06/05/2014  . High anion gap metabolic acidosis [id number]  . Acidosis, lactic 06/05/2014  . Lung edema 06/05/2014  . Alveolar hypoventilation 06/05/2014  . Arthritis, degenerative 02/20/2014    Past Surgical History:  Procedure Laterality Date  . ABDOMINAL HYSTERECTOMY     partial  . APPENDECTOMY    . COLONOSCOPY    . ESOPHAGOGASTRODUODENOSCOPY    . ESOPHAGOGASTRODUODENOSCOPY (EGD) WITH PROPOFOL N/A 07/04/2015   Procedure: ESOPHAGOGASTRODUODENOSCOPY (EGD) WITH PROPOFOL;  Surgeon: Manya Silvas, MD;  Location: [facility] ENDOSCOPY;  Service: Endoscopy;  Laterality: N/A;  . SHOULDER ARTHROSCOPY    . SIGMOIDOSCOPY      Allergies Review of patient's allergies indicates no known allergies.  Social History Social History  Substance Use Topics  . Smoking status: Former Smoker    Packs/day: 1.00    Years: 60.00    Types: Cigarettes  . Smokeless tobacco: Never Used  . Alcohol use No    Review of Systems Unknown at this time  ____________________________________________   PHYSICAL  EXAM:  VITAL SIGNS: ED Triage Vitals [05/18/16 0946]  Enc Vitals Group     BP      Pulse      Resp      Temp      Temp src      SpO2 99 %     Weight      Height      Head Circumference      Peak Flow      Pain Score      Pain Loc      Pain Edu?      Excl. in [place]?     Constitutional: Alert But disoriented, no acute distress  Eyes: Conjunctivae are normal. PERRL. Normal extraocular movements. ENT   Head: Normocephalic and atraumatic.   Nose: No congestion/rhinnorhea.   Mouth/Throat: Mucous membranes are moist.   Neck: No stridor. Cardiovascular: Normal rate, regular rhythm. No murmurs, rubs, or gallops. Respiratory: Normal respiratory effort without tachypnea nor retractions. Breath sounds are clear and equal bilaterally. No wheezes/rales/rhonchi. Gastrointestinal: Soft and nontender. Normal bowel sounds Musculoskeletal: Nontender with normal range of motion in all extremities. No lower extremity tenderness nor edema. Neurologic:  Normal speech and language. No gross focal neurologic deficits are appreciated. Generalized weakness, nothing focal Skin:  Skin is warm, dry and intact. No rash noted. Psychiatric: Mood and affect are normal. Speech and behavior are  normal.  ____________________________________________  EKG: Interpreted by me. Sinus rhythm rate of 75 bpm, left bundle branch block, left axis deviation, long QT  ____________________________________________  ED COURSE:  Pertinent labs & imaging results that were available during my care of the patient were reviewed by me and considered in my medical decision making (see chart for details). Clinical Course  Patient presents to the ER with weakness of uncertain etiology. We will check basic labs and imaging as needed.  Procedures ____________________________________________   LABS (pertinent positives/negatives)  Labs Reviewed  CBC WITH DIFFERENTIAL/PLATELET - Abnormal; Notable for the following:        Result Value   RBC 3.67 (*)    Hemoglobin 11.2 (*)    HCT 33.3 (*)    Lymphs Abs 0.6 (*)    Monocytes Absolute 1.2 (*)    All other components within normal limits  COMPREHENSIVE METABOLIC PANEL - Abnormal; Notable for the following:    Chloride 96 (*)    Glucose, Bld 143 (*)    Calcium 8.5 (*)    Albumin 3.3 (*)    AST 71 (*)    All other components within normal limits  URINALYSIS COMPLETEWITH MICROSCOPIC ([facility] ONLY) - Abnormal; Notable for the following:    Color, Urine YELLOW (*)    APPearance CLEAR (*)    Hgb urine dipstick 1+ (*)    Protein, ur 30 (*)    Leukocytes, UA 2+ (*)    Bacteria, UA RARE (*)    Squamous Epithelial / LPF 0-5 (*)    All other components within normal limits  CULTURE, BLOOD (ROUTINE X 2)  CULTURE, BLOOD (ROUTINE X 2)  URINE CULTURE  TROPONIN I  LACTIC ACID, PLASMA  LACTIC ACID, PLASMA    RADIOLOGY Images were viewed by me  Chest x-ray\CT renal porotocol IMPRESSION: 1.  Cardiomegaly with mild pulmonary venous prominence.  2. Bilateral mild pulmonary interstitial prominence consistent with chronic interstitial lung disease.  IMPRESSION: No acute finding.  No hydronephrosis or urolithiasis.   ____________________________________________  FINAL ASSESSMENT AND PLAN  Weakness, Cystitis, hypoxia  Plan: Patient with labs and imaging as dictated above. Patient presented with lethargy and altered mental status. Findings appear to be consistent with UTI. She has also need supplemental oxygen of uncertain etiology. Trachea this point is unremarkable. She has received IV Rocephin. I will discuss with the hospitalist for admission.   Earleen Newport, MD   Note: This dictation was prepared with Dragon dictation. Any transcriptional errors that result from this process are unintentional    Earleen Newport, MD 05/18/16 1253

## 2016-05-18 NOTE — H&P (Signed)
[place] [facility] [place] NAME: Theresa Vasquez    MR#:  [id number]  DATE OF BIRTH:  05-May-1931  DATE OF ADMISSION:  05/18/2016  PRIMARY CARE PHYSICIAN: Park Liter, DO   REQUESTING/REFERRING PHYSICIAN: Dr Cephas Darby  CHIEF COMPLAINT:   Chief Complaint  Patient presents with  . Flank Pain    HISTORY OF PRESENT ILLNESS:  Theresa Vasquez  is a 82 y.o. female presenting with back pain and foul-smelling urine. Patient with history of dementia and is a poor historian. Patient brought in by family for foul-smelling urine. This morning at 3 AM she was trembling. She has 24 7 caregivers at her home. She's been very weak and can barely stand up. They checked her pulse ox and it was in the 80s and they placed oxygen on her. Her urine had an overwhelming smell. She's had more confusion and family brought her in for further evaluation.  PAST MEDICAL HISTORY:   Past Medical History:  Diagnosis Date  . Adenomatous polyps   . Age-related osteoporosis without current pathological fracture   . Anemia   . Anxiety   . Atherosclerosis of aorta ([place])   . Atrial fibrillation with RVR ([place])   . Barrett's esophagus without dysplasia   . CHF (congestive heart failure) ([place])   . Colitis   . COPD (chronic obstructive pulmonary disease) ([place])   . Dementia   . Depression   . Diabetes mellitus type II, uncontrolled ([place])   . Diverticulitis   . Diverticulosis of intestine without perforation or abscess without bleeding   . Edema   . Essential hemorrhagic thrombocythemia ([place])   . Gastrointestinal hemorrhage   . High anion gap metabolic acidosis   . Hyperlipidemia   . Hypertension   . Hypertensive chronic kidney disease with stage 1 through stage 4 chronic kidney disease, or unspecified chronic kidney disease   . Hypothyroidism   . Hypoxemia   . Iron deficiency anemia   . Lactic acidosis   . Left bundle branch block   . Nonrheumatic mitral  valve insufficiency   . Osteoarthritis   . Other psychoactive substance dependence, in remission ([place])   . Other specified symptoms and signs involving the circulatory and respiratory systems   . Paroxysmal atrial fibrillation (HCC)    s/p cardioversion  . Personal history of peptic ulcer disease   . Pulmonary edema   . PVD (peripheral vascular disease) ([place]) 05/14/2015  . Recurrent pneumonia    history of  . Respiratory failure with hypoxia and hypercapnia ([place])   . Type 2 diabetes mellitus with diabetic chronic kidney disease ([place])   . Type 2 diabetes mellitus with diabetic polyneuropathy ([place])   . Vascular dementia without behavioral disturbance   . Vascular disorder of intestine ([place])     PAST SURGICAL HISTORY:   Past Surgical History:  Procedure Laterality Date  . ABDOMINAL HYSTERECTOMY     partial  . APPENDECTOMY    . COLONOSCOPY    . ESOPHAGOGASTRODUODENOSCOPY    . ESOPHAGOGASTRODUODENOSCOPY (EGD) WITH PROPOFOL N/A 07/04/2015   Procedure: ESOPHAGOGASTRODUODENOSCOPY (EGD) WITH PROPOFOL;  Surgeon: Manya Silvas, MD;  Location: [facility] ENDOSCOPY;  Service: Endoscopy;  Laterality: N/A;  . SHOULDER ARTHROSCOPY    . SIGMOIDOSCOPY      SOCIAL HISTORY:   Social History  Substance Use Topics  . Smoking status: Former Smoker    Packs/day: 1.00    Years: 60.00    Types: Cigarettes  . Smokeless tobacco: Never Used  .  Alcohol use No    FAMILY HISTORY:   Family History  Problem Relation Age of Onset  . Diabetes Mother   . Diabetes Father     DRUG ALLERGIES:  No Known Allergies  REVIEW OF SYSTEMS:  CONSTITUTIONAL: No fever, fatigue or weakness.  EYES: No blurred or double vision. Wears glasses EARS, NOSE, AND THROAT: No tinnitus or ear pain. No sore throat. Decreased hearing RESPIRATORY: No cough, shortness of breath, wheezing or hemoptysis.  CARDIOVASCULAR: No chest pain, orthopnea, edema.  GASTROINTESTINAL: No nausea, vomiting, diarrhea or abdominal pain. No  blood in bowel movements GENITOURINARY: No dysuria, hematuria. Foul-smelling urine. ENDOCRINE: No polyuria, nocturia, history of hypothyroidism HEMATOLOGY: No anemia, easy bruising or bleeding SKIN: No rash or lesion. MUSCULOSKELETAL: Back pain  NEUROLOGIC: No tingling, numbness.  PSYCHIATRY: Positive for anxiety.   MEDICATIONS AT HOME:   Prior to Admission medications   Medication Sig Start Date End Date Taking? Authorizing Provider  acetaminophen (TYLENOL) 500 MG tablet Take 500 mg by mouth every 4 (four) hours as needed.   Yes Historical Provider, MD  albuterol (PROVENTIL) (2.5 MG/3ML) 0.083% nebulizer solution Take 3 mLs (2.5 mg total) by nebulization every 4 (four) hours as needed for wheezing or shortness of breath. 11/26/15  Yes Megan P Johnson, DO  amiodarone (PACERONE) 200 MG tablet Take 100 mg by mouth daily.    Yes Historical Provider, MD  amLODipine (NORVASC) 5 MG tablet Take 1 tablet (5 mg total) by mouth 2 (two) times daily. 11/26/15  Yes Megan P Johnson, DO  aspirin EC 81 MG tablet Take 81 mg by mouth daily.    Yes Historical Provider, MD  Cholecalciferol (VITAMIN D-3) 1000 units CAPS Take 1 capsule (1,000 Units total) by mouth daily. 11/26/15  Yes Megan P Johnson, DO  divalproex (DEPAKOTE) 250 MG DR tablet Take 1 tablet (250 mg total) by mouth 3 (three) times daily. 11/26/15  Yes Megan P Johnson, DO  ferrous sulfate 325 (65 FE) MG tablet Take 1 tablet (325 mg total) by mouth 2 (two) times daily with a meal. Patient taking differently: Take 325 mg by mouth daily.  11/26/15  Yes Megan P Johnson, DO  Fluticasone-Salmeterol (ADVAIR) 250-50 MCG/DOSE AEPB Inhale 1 puff into the lungs 2 (two) times daily. 11/26/15  Yes Megan P Johnson, DO  furosemide (LASIX) 40 MG tablet Take 1 tablet (40 mg total) by mouth 2 (two) times daily. 11/26/15  Yes Megan P Johnson, DO  gabapentin (NEURONTIN) 400 MG capsule Take 1 capsule (400 mg total) by mouth 2 (two) times daily. 11/26/15  Yes Megan P Johnson, DO   levothyroxine (SYNTHROID, LEVOTHROID) 150 MCG tablet TAKE 1 TABLET BY MOUTH DAILY BEFORE BREAKFAST 05/12/16  Yes Megan P Johnson, DO  lisinopril (PRINIVIL,ZESTRIL) 20 MG tablet Take 1 tablet (20 mg total) by mouth 2 (two) times daily. 11/26/15  Yes Megan P Johnson, DO  Melatonin 3 MG TABS Take 1 tablet (3 mg total) by mouth at bedtime. 05/28/15  Yes Megan P Johnson, DO  metFORMIN (GLUMETZA) 500 MG (MOD) 24 hr tablet Take 1 tablet (500 mg total) by mouth daily with breakfast. 11/26/15  Yes Megan P Johnson, DO  metoprolol (LOPRESSOR) 50 MG tablet Take 0.5 tablets (25 mg total) by mouth 2 (two) times daily. 11/26/15  Yes Megan P Johnson, DO  pantoprazole (PROTONIX) 40 MG tablet Take 1 tablet (40 mg total) by mouth 2 (two) times daily. 11/26/15  Yes Megan P Johnson, DO  QUEtiapine (SEROQUEL) 25 MG tablet Take 1  tablet (25 mg total) by mouth at bedtime. 11/26/15  Yes Megan P Johnson, DO  tiotropium (SPIRIVA) 18 MCG inhalation capsule Place 1 capsule (18 mcg total) into inhaler and inhale daily. 11/26/15  Yes Megan P Johnson, DO  venlafaxine XR (EFFEXOR XR) 37.5 MG 24 hr capsule Take 1 capsule (37.5 mg total) by mouth daily with breakfast. 11/26/15  Yes Megan P Johnson, DO  donepezil (ARICEPT) 10 MG tablet Take 1 tablet (10 mg total) by mouth at bedtime. 11/26/15   Megan P Johnson, DO  MICROLET LANCETS MISC TEST BLOOD GLUCOSE LEVEL ONCE DAILY 04/10/16   Megan P Johnson, DO      VITAL SIGNS:  Blood pressure (!) 156/60, pulse 73, temperature 100 F (37.8 C), temperature source Oral, resp. rate (!) 24, height 5\' 7"  (1.702 m), weight 85.7 kg (189 lb), SpO2 96 %.  PHYSICAL EXAMINATION:  GENERAL:  82 y.o.-year-old patient lying in the bed with no acute distress.  EYES: Pupils equal, round, reactive to light and accommodation. No scleral icterus. Extraocular muscles intact.  HEENT: Head atraumatic, normocephalic. Oropharynx and nasopharynx clear.  NECK:  Supple, no jugular venous distention. No thyroid enlargement, no  tenderness.  LUNGS: Decreased breath sounds bilateral bases, no wheezing, rales,rhonchi or crepitation. No use of accessory muscles of respiration.  CARDIOVASCULAR: S1, S2 normal. No murmurs, rubs, or gallops.  ABDOMEN: Soft, nontender, nondistended. Bowel sounds present. No organomegaly or mass.  EXTREMITIES: edema, trace. No  cyanosis, or clubbing.  NEUROLOGIC: Cranial nerves II through XII are intact. Sensation intact. Gait not checked.  PSYCHIATRIC: The patient is alert.  SKIN:  no rash or ulcer   LABORATORY PANEL:   CBC  Recent Labs Lab 05/18/16 1016  WBC 7.2  HGB 11.2*  HCT 33.3*  PLT 338   ------------------------------------------------------------------------------------------------------------------  Chemistries   Recent Labs Lab 05/18/16 1016  NA 136  K 3.6  CL 96*  CO2 31  GLUCOSE 143*  BUN 19  CREATININE 0.81  CALCIUM 8.5*  AST 71*  ALT 30  ALKPHOS 56  BILITOT 0.3   ------------------------------------------------------------------------------------------------------------------  Cardiac Enzymes  Recent Labs Lab 05/18/16 1016  TROPONINI <0.03   ------------------------------------------------------------------------------------------------------------------  RADIOLOGY:  Dg Chest 2 View  Result Date: 05/18/2016 CLINICAL DATA:  Right flank pain. EXAM: CHEST  2 VIEW COMPARISON:  08/29/2015 . FINDINGS: Cardiomegaly with mild pulmonary vascular prominence. Mild bilateral pulmonary interstitial prominence noted. Interstitial prominence is stable and most consistent with chronic interstitial lung disease. IMPRESSION: 1.  Cardiomegaly with mild pulmonary venous prominence. 2. Bilateral mild pulmonary interstitial prominence consistent with chronic interstitial lung disease. Electronically Signed   By: Marcello Moores  Register   On: 05/18/2016 11:18   Ct Renal Stone Study  Result Date: 05/18/2016 CLINICAL DATA:  Right flank pain EXAM: CT ABDOMEN AND PELVIS  WITHOUT CONTRAST TECHNIQUE: Multidetector CT imaging of the abdomen and pelvis was performed following the standard protocol without IV contrast. COMPARISON:  10/21/2011 FINDINGS: Lower chest: No acute finding. Mitral and aortic annular calcification. Hepatobiliary: No focal liver abnormality.No evidence of biliary obstruction or stone. Pancreas: Unremarkable. Spleen: Unremarkable. Adrenals/Urinary Tract: Negative adrenals. Renal hilar calcifications appear arterial. No hydronephrosis or stone. 16 mm right upper pole cyst. Unremarkable bladder. Stomach/Bowel: No obstruction. Colonic diverticulosis. No pericecal inflammation. Vascular/Lymphatic: No acute vascular abnormality. Diffuse atherosclerotic calcification. No mass or adenopathy. Reproductive:Hysterectomy.  Negative adnexae. Other: No ascites or pneumoperitoneum. Musculoskeletal: No acute abnormalities. L4-5 advanced facet arthropathy with grade 1 anterolisthesis. No acute or aggressive finding. IMPRESSION: No acute finding.  No  hydronephrosis or urolithiasis. Electronically Signed   By: Monte Fantasia M.D.   On: 05/18/2016 12:49    EKG:   Sinus rhythm 75 bpm left bundle branch block   IMPRESSION AND PLAN:   1. Acute hypoxic respiratory failure. Patient does have a history of COPD. Chest x-ray consistent with chronic interstitial lung disease although previous CT scan in January of this year does not show that. The patient does have oxygen at home but does not wear it. I will get care manager and nursing staff to qualify for oxygen at home.  2. Acute cystitis with hematuria. Follow-up urine culture. Rocephin ordered. 3. Acute encephalopathy with baseline dementia. Hopefully with treatment of urinary tract infection and her mental status will be back to baseline. 4. History of atrial fibrillation. Continue amiodarone and metoprolol. Aspirin for anticoagulation 5. Hypothyroidism unspecified continue levothyroxine 6. Dementia without behavioral  disturbance. Continue Seroquel at night and Aricept 7. Weakness get physical therapy evaluation 8. Anxiety depression continue all psychiatric medications 9. Essential hypertension continue usual blood pressure medications 10. Type 2 diabetes mellitus on Glucophage as outpatient. Place on sliding scale while here 11. GERD on Protonix    All the records are reviewed and case discussed with ED provider. Management plans discussed with the patient, family and they are in agreement.  CODE STATUS: Full code   TOTAL TIME TAKING CARE OF THIS PATIENT:50 minutes.    Loletha Grayer M.D on 05/18/2016 at 1:28 PM  Between 7am to 6pm - Pager - [phone]  After 6pm call admission pager [phone]  [employer] Office  [phone]  CC: Primary care physician; Park Liter, DO

## 2016-05-18 NOTE — ED Notes (Signed)
Patient transported to CT 

## 2016-05-18 NOTE — ED Triage Notes (Signed)
Per EMS pain in R flank, pt lives at home with caregiver and son. Per EMS caregiver says she has been lethargic and not normal for herself. Pt has history of multiple UTI's

## 2016-05-19 ENCOUNTER — Ambulatory Visit: Payer: Medicare Other | Admitting: Family Medicine

## 2016-05-19 LAB — CBC
HEMATOCRIT: 30.6 % — AB (ref 35.0–47.0)
HEMOGLOBIN: 10 g/dL — AB (ref 12.0–16.0)
MCH: 29.6 pg (ref 26.0–34.0)
MCHC: 32.5 g/dL (ref 32.0–36.0)
MCV: 91 fL (ref 80.0–100.0)
Platelets: 300 10*3/uL (ref 150–440)
RBC: 3.37 MIL/uL — ABNORMAL LOW (ref 3.80–5.20)
RDW: 14.5 % (ref 11.5–14.5)
WBC: 7.9 10*3/uL (ref 3.6–11.0)

## 2016-05-19 LAB — BASIC METABOLIC PANEL
Anion gap: 6 (ref 5–15)
BUN: 13 mg/dL (ref 6–20)
CALCIUM: 8 mg/dL — AB (ref 8.9–10.3)
CO2: 31 mmol/L (ref 22–32)
CREATININE: 0.81 mg/dL (ref 0.44–1.00)
Chloride: 102 mmol/L (ref 101–111)
Glucose, Bld: 148 mg/dL — ABNORMAL HIGH (ref 65–99)
Potassium: 3.7 mmol/L (ref 3.5–5.1)
SODIUM: 139 mmol/L (ref 135–145)

## 2016-05-19 LAB — GLUCOSE, CAPILLARY
GLUCOSE-CAPILLARY: 119 mg/dL — AB (ref 65–99)
GLUCOSE-CAPILLARY: 137 mg/dL — AB (ref 65–99)
GLUCOSE-CAPILLARY: 145 mg/dL — AB (ref 65–99)
Glucose-Capillary: 144 mg/dL — ABNORMAL HIGH (ref 65–99)
Glucose-Capillary: 141 mg/dL — ABNORMAL HIGH (ref 65–99)

## 2016-05-19 LAB — URINE CULTURE
Culture: <10000 — AB
SPECIAL REQUESTS: NORMAL

## 2016-05-19 MED ORDER — FUROSEMIDE 10 MG/ML IJ SOLN
40.0000 mg | Freq: Once | INTRAMUSCULAR | Status: AC
  Administered 2016-05-19: 40 mg via INTRAVENOUS
  Filled 2016-05-19: qty 4

## 2016-05-19 NOTE — Care Management (Signed)
Admitted to [facility] with the diagnosis of acute respiratory failure. Lives alone. Son is Elberta Fortis [phone]). 24/7 x 7 days a week private caregivers in the home. Appointment was scheduled with Park Liter at [facility] for today, Encouraged caregiver to be sure [facility] knows Theresa Vasquez is in the hospital. [facility] in the past. [facility] in the past. Home oxygen through [place]. No falls for a while. Good appetite. Prescriptions are filled at [facility]. Son will transport. Shelbie Ammons RN MSN CCM Care Management [phone]

## 2016-05-19 NOTE — Progress Notes (Signed)
[name]    MR#:  [id number]  DATE OF BIRTH:  1931/06/11  SUBJECTIVE:  CHIEF COMPLAINT:   Chief Complaint  Patient presents with  . Flank Pain   Patient has no concerns. Worked with physical therapy. Extremely weak  REVIEW OF SYSTEMS:    Review of Systems  Unable to perform ROS: Dementia    DRUG ALLERGIES:  No Known Allergies  VITALS:  Blood pressure (!) 114/42, pulse 69, temperature 99.3 F (37.4 C), temperature source Oral, resp. rate (!) 21, height 5\' 7"  (1.702 m), weight 85.7 kg (189 lb), SpO2 95 %.  PHYSICAL EXAMINATION:   Physical Exam  GENERAL:  82 y.o.-year-old patient lying in the bed with no acute distress.  EYES: Pupils equal, round, reactive to light and accommodation. No scleral icterus. Extraocular muscles intact.  HEENT: Head atraumatic, normocephalic. Oropharynx and nasopharynx clear.  NECK:  Supple, no jugular venous distention. No thyroid enlargement, no tenderness.  LUNGS: Normal work of breathing with both expiratory and inspiratory crackles CARDIOVASCULAR: S1, S2 normal. No murmurs, rubs, or gallops.  ABDOMEN: Soft, nontender, nondistended. Bowel sounds present. No organomegaly or mass.  EXTREMITIES: No cyanosis, clubbing or edema b/l.    NEUROLOGIC: Cranial nerves II through XII are intact. No focal Motor or sensory deficits b/l.   PSYCHIATRIC: The patient is alert and awake. Pleasant SKIN: No obvious rash, lesion, or ulcer.   LABORATORY PANEL:   CBC  Recent Labs Lab 05/19/16 0546  WBC 7.9  HGB 10.0*  HCT 30.6*  PLT 300   ------------------------------------------------------------------------------------------------------------------ Chemistries   Recent Labs Lab 05/18/16 1016 05/19/16 0546  NA 136 139  K 3.6 3.7  CL 96* 102  CO2 31 31  GLUCOSE 143* 148*  BUN 19 13  CREATININE 0.81 0.81  CALCIUM 8.5* 8.0*  AST 71*  --   ALT 30  --   ALKPHOS 56  --    BILITOT 0.3  --    ------------------------------------------------------------------------------------------------------------------  Cardiac Enzymes  Recent Labs Lab 05/18/16 1016  TROPONINI <0.03   ------------------------------------------------------------------------------------------------------------------  RADIOLOGY:  Dg Chest 2 View  Result Date: 05/18/2016 CLINICAL DATA:  Right flank pain. EXAM: CHEST  2 VIEW COMPARISON:  08/29/2015 . FINDINGS: Cardiomegaly with mild pulmonary vascular prominence. Mild bilateral pulmonary interstitial prominence noted. Interstitial prominence is stable and most consistent with chronic interstitial lung disease. IMPRESSION: 1.  Cardiomegaly with mild pulmonary venous prominence. 2. Bilateral mild pulmonary interstitial prominence consistent with chronic interstitial lung disease. Electronically Signed   By: Marcello Moores  Register   On: 05/18/2016 11:18   Ct Renal Stone Study  Result Date: 05/18/2016 CLINICAL DATA:  Right flank pain EXAM: CT ABDOMEN AND PELVIS WITHOUT CONTRAST TECHNIQUE: Multidetector CT imaging of the abdomen and pelvis was performed following the standard protocol without IV contrast. COMPARISON:  10/21/2011 FINDINGS: Lower chest: No acute finding. Mitral and aortic annular calcification. Hepatobiliary: No focal liver abnormality.No evidence of biliary obstruction or stone. Pancreas: Unremarkable. Spleen: Unremarkable. Adrenals/Urinary Tract: Negative adrenals. Renal hilar calcifications appear arterial. No hydronephrosis or stone. 16 mm right upper pole cyst. Unremarkable bladder. Stomach/Bowel: No obstruction. Colonic diverticulosis. No pericecal inflammation. Vascular/Lymphatic: No acute vascular abnormality. Diffuse atherosclerotic calcification. No mass or adenopathy. Reproductive:Hysterectomy.  Negative adnexae. Other: No ascites or pneumoperitoneum. Musculoskeletal: No acute abnormalities. L4-5 advanced facet arthropathy with  grade 1 anterolisthesis. No acute or aggressive finding. IMPRESSION: No acute finding.  No hydronephrosis or urolithiasis. Electronically Signed   By: Angelica Chessman  Watts M.D.   On: 05/18/2016 12:49   ASSESSMENT AND PLAN:   * Acute cystitis with hematuria and acute encephalopathy over baseline dementia On IV antibiotics. Urine cultures pending. Fall precautions.  * Chronic respiratory failure with interstitial lung disease Needs home oxygen. Outpatient follow-up with pulmonary.  * History of atrial fibrillation. Continue amiodarone and metoprolol. Aspirin for anticoagulation  * Hypothyroidism unspecified continue levothyroxine  * Dementia without behavioral disturbance. Continue Seroquel at night and Aricept Watch for inpatient delirium  * Essential hypertension continue meds  * Type 2 diabetes mellitus on Glucophage as outpatient. Place on sliding scale while here  All the records are reviewed and case discussed with Care Management/Social Workerr. Management plans discussed with the patient, family and they are in agreement.  CODE STATUS: FULL CODE  DVT Prophylaxis: SCDs  TOTAL TIME TAKING CARE OF THIS PATIENT: 35 minutes.   POSSIBLE D/C IN 1-2 DAYS, DEPENDING ON CLINICAL CONDITION.  Hillary Bow R M.D on 05/19/2016 at 12:05 PM  Between 7am to 6pm - Pager - [phone]  After 6pm go to [email or web address] - password [email or web address]  Office  [phone]  CC: Primary care physician; [place] Liter, DO  Note: This dictation was prepared with Dragon dictation along with smaller phrase technology. Any transcriptional errors that result from this process are unintentional.

## 2016-05-19 NOTE — Evaluation (Signed)
Physical Therapy Evaluation Patient Details Name: PANAGIOTA FAUL MRN: [id number] DOB: 11/15/1930 Today's Date: 05/19/2016   History of Present Illness  Pt admitted for ARF. Pt with history of COPD, CHF, dementia, and DM. Pt currently on 2L of O2 at this time.  Clinical Impression  Pt is a pleasant 82 year old female who was admitted for ARF. Pt performs bed mobility with mod assist, transfers with mod assist +2, and ambulation with min assist with RW +2. Pt demonstrates deficits with cognition/strength/endurance/mobility. Pt is currently not at baseline level as typically she is able to ambulate household distances with supervision. All mobility performed on 2 L of O2 with sats WNL. R side leaning noted in static sitting, however gets worse with fatigue. No complaints of pain with WBing. Would benefit from skilled PT to address above deficits and promote optimal return to PLOF. Caregiver in room and agreeable to provide adequate assistance at home. Per caregiver, pt does not respond well to SNF and does better in home environment. Continue to recommend 24/7 assist at this time. Recommend transition to [place] upon discharge from acute hospitalization.       Follow Up Recommendations Home health PT;Supervision/Assistance - 24 hour    Equipment Recommendations  None recommended by PT    Recommendations for Other Services       Precautions / Restrictions Precautions Precautions: Fall Restrictions Weight Bearing Restrictions: No      Mobility  Bed Mobility Overal bed mobility: Needs Assistance Bed Mobility: Supine to Sit     Supine to sit: Mod assist     General bed mobility comments: assist for scooting B hips towards EOB. Pt with R side leaning, however correctable with min assist. Pt then fatigues and leans towards R side. Pt able to perform bed mobility using bed rails with B UEs.  Transfers Overall transfer level: Needs assistance Equipment used: Rolling walker (2  wheeled) Transfers: Sit to/from Stand Sit to Stand: Mod assist         General transfer comment: +2 assist for upright posture. Pt able to WB through B UE and achieve upright standing with cues for technique.  Ambulation/Gait Ambulation/Gait assistance: Min assist Ambulation Distance (Feet): 3 Feet Assistive device: Rolling walker (2 wheeled) Gait Pattern/deviations: Step-to pattern     General Gait Details: ambulated using +2 with manual assist for moving RW. Pt able to take small steps towards chair. Pt easily fatigues with exertion  Stairs            Wheelchair Mobility    Modified Rankin (Stroke Patients Only)       Balance Overall balance assessment: Needs assistance Sitting-balance support: Feet supported Sitting balance-Leahy Scale: Poor Sitting balance - Comments: R side leaning noted in seated position, able to self correct temporarily   Standing balance support: Bilateral upper extremity supported Standing balance-Leahy Scale: Fair                               Pertinent Vitals/Pain Pain Assessment: No/denies pain    Home Living Family/patient expects to be discharged to:: Private residence Living Arrangements:  (has 24/7 caregivers) Available Help at Discharge: Available 24 hours/day;Personal care attendant Type of Home: House Home Access: Ramped entrance     Home Layout: One [place]: [place] - 4 wheels;Walker - 2 wheels (lift chair)      Prior Function Level of Independence: Independent with assistive device(s)  Comments: was ambulatory with rollater in home until 1 week ago. limited household distances     Hand Dominance        Extremity/Trunk Assessment   Upper Extremity Assessment: Overall WFL for tasks assessed           Lower Extremity Assessment: Generalized weakness (B LE grossly 3/5)         Communication   Communication: No difficulties  Cognition Arousal/Alertness:  Awake/alert Behavior During Therapy: WFL for tasks assessed/performed Overall Cognitive Status: History of cognitive impairments - at baseline                      General Comments      Exercises Other Exercises Other Exercises: Seated ther-ex performed including B UE reaching outside BOS to correct posteral leaning. Pt also performed ankle pumps and LAQ on B LE with supervision. 10 reps performed   Assessment/Plan    PT Assessment Patient needs continued PT services  PT Problem List Decreased strength;Decreased activity tolerance;Decreased balance;Decreased mobility          PT Treatment Interventions Gait training;DME instruction;Therapeutic exercise;Balance training    PT Goals (Current goals can be found in the Care Plan section)  Acute Rehab PT Goals Patient Stated Goal: to go home PT Goal Formulation: With patient Time For Goal Achievement: 06/02/16 Potential to Achieve Goals: Good    Frequency Min 2X/week   Barriers to discharge        Co-evaluation               End of Session Equipment Utilized During Treatment: Gait belt;Oxygen Activity Tolerance: Patient limited by fatigue Patient left: in chair;with chair alarm set;with nursing/sitter in room;with family/visitor present Nurse Communication: Mobility status         Time: 1030-1054 PT Time Calculation (min) (ACUTE ONLY): 24 min   Charges:   PT Evaluation $PT Eval Moderate Complexity: 1 Procedure PT Treatments $Therapeutic Exercise: 8-22 mins   PT G Codes:        Maury Bamba 29-May-2016, 11:25 AM Greggory Stallion, PT, DPT [phone]

## 2016-05-20 LAB — GLUCOSE, CAPILLARY: GLUCOSE-CAPILLARY: 145 mg/dL — AB (ref 65–99)

## 2016-05-20 MED ORDER — MENTHOL 3 MG MT LOZG
1.0000 | LOZENGE | OROMUCOSAL | Status: DC | PRN
  Filled 2016-05-20: qty 9

## 2016-05-20 MED ORDER — CEPHALEXIN 250 MG PO CAPS: 250.0000 mg | ORAL_CAPSULE | Freq: Four times a day (QID) | ORAL | 0 refills | Status: DC

## 2016-05-20 NOTE — Progress Notes (Signed)
Pt being discharged home with home health, discharge instructions reviewed with son, states understanding, pt with no complaints, no distress or discomfort noted

## 2016-05-20 NOTE — Care Management Important Message (Signed)
Important Message  Patient Details  Name: Theresa Vasquez MRN: [id number] Date of Birth: 1931-05-20   Medicare Important Message Given:  Yes    Shelbie Ammons, RN 05/20/2016, 7:25 AM

## 2016-05-20 NOTE — Care Management (Signed)
Discharge to home today per Dr. Darvin Neighbours. Physical therapy evaluation completed yesterday. Recommending home with home health, physical therapy. Would like [place] ([facility]). Will fax face sheet, face-to-face, home health order, and history & physical faxed to [facility]. Son will transport. Shelbie Ammons RN MSN CCM Care Management [phone]

## 2016-05-21 NOTE — Discharge Summary (Signed)
[facility] NAME: Theresa Vasquez    MR#:  [id number]  DATE OF BIRTH:  06/18/1931  DATE OF ADMISSION:  05/18/2016 ADMITTING PHYSICIAN: Loletha Grayer, MD  DATE OF DISCHARGE: 05/20/2016 12:36 PM  PRIMARY CARE PHYSICIAN: [place] Liter, DO   ADMISSION DIAGNOSIS:  Weakness [R53.1] Hypoxia [R09.02] Flank pain [R10.9] Urinary tract infection without hematuria, site unspecified [N39.0]  DISCHARGE DIAGNOSIS:  Active Problems:   Acute respiratory failure with hypoxia ([place])   SECONDARY DIAGNOSIS:   Past Medical History:  Diagnosis Date  . Adenomatous polyps   . Age-related osteoporosis without current pathological fracture   . Anemia   . Anxiety   . Atherosclerosis of aorta ([place])   . Atrial fibrillation with RVR ([place])   . Barrett's esophagus without dysplasia   . CHF (congestive heart failure) ([place])   . Colitis   . COPD (chronic obstructive pulmonary disease) ([place])   . Dementia   . Depression   . Diabetes mellitus type II, uncontrolled ([place])   . Diverticulitis   . Diverticulosis of intestine without perforation or abscess without bleeding   . Edema   . Essential hemorrhagic thrombocythemia ([place])   . Gastrointestinal hemorrhage   . High anion gap metabolic acidosis   . Hyperlipidemia   . Hypertension   . Hypertensive chronic kidney disease with stage 1 through stage 4 chronic kidney disease, or unspecified chronic kidney disease   . Hypothyroidism   . Hypoxemia   . Iron deficiency anemia   . Lactic acidosis   . Left bundle branch block   . Nonrheumatic mitral valve insufficiency   . Osteoarthritis   . Other psychoactive substance dependence, in remission ([place])   . Other specified symptoms and signs involving the circulatory and respiratory systems   . Paroxysmal atrial fibrillation (H[facility])    s/p cardioversion  . Personal history of peptic ulcer disease   . Pulmonary edema   . PVD (peripheral vascular disease) ([place])  05/14/2015  . Recurrent pneumonia    history of  . Respiratory failure with hypoxia and hypercapnia ([place])   . Type 2 diabetes mellitus with diabetic chronic kidney disease ([place])   . Type 2 diabetes mellitus with diabetic polyneuropathy ([place])   . Vascular dementia without behavioral disturbance   . Vascular disorder of intestine ([place])      ADMITTING HISTORY  HISTORY OF PRESENT ILLNESS:  Theresa Vasquez  is a 82 y.o. female presenting with back pain and foul-smelling urine. Patient with history of dementia and is a poor historian. Patient brought in by family for foul-smelling urine. This morning at 3 AM she was trembling. She has 24 7 caregivers at her home. She's been very weak and can barely stand up. They checked her pulse ox and it was in the 80s and they placed oxygen on her. Her urine had an overwhelming smell. She's had more confusion and family brought her in for further evaluation.   HOSPITAL COURSE:   * Acute cystitis with hematuria and acute encephalopathy over baseline dementia On IV antibiotics. Urine cultures Negative. Treat with Keflex at discharge. Afebrile. Normal WBC. No dysuria. No hematuria.  * Chronic respiratory failure with interstitial lung disease Has home oxygen, but has not been using it.. Outpatient follow-up with pulmonary.  * History of atrial fibrillation. Continue amiodarone and metoprolol. Aspirin for anticoagulation  * Hypothyroidism unspecified continue levothyroxine  * Dementia without behavioral disturbance. Continue Seroquel at night and Aricept Patient has been pleasant through  her hospital stay  * Essential hypertension continue meds  * Type 2 diabetes mellitus on Glucophage as outpatient. Placed on sliding scale while here.  Stable for discharge home. Patient has round the clock caregivers at home. Home health physical therapy set up. Discussed with son on the day of discharge.  CONSULTS OBTAINED:    DRUG ALLERGIES:  No Known  Allergies  DISCHARGE MEDICATIONS:   Discharge Medication List as of 05/20/2016 11:36 AM    START taking these medications   Details  cephALEXin (KEFLEX) 250 MG capsule Take 1 capsule (250 mg total) by mouth 4 (four) times daily., Starting Wed 05/20/2016, Normal      CONTINUE these medications which have NOT CHANGED   Details  acetaminophen (TYLENOL) 500 MG tablet Take 500 mg by mouth every 4 (four) hours as needed., Historical Med    albuterol (PROVENTIL) (2.5 MG/3ML) 0.083% nebulizer solution Take 3 mLs (2.5 mg total) by nebulization every 4 (four) hours as needed for wheezing or shortness of breath., Starting Tue 11/26/2015, Normal    amiodarone (PACERONE) 200 MG tablet Take 100 mg by mouth daily. , Historical Med    amLODipine (NORVASC) 5 MG tablet Take 1 tablet (5 mg total) by mouth 2 (two) times daily., Starting Tue 11/26/2015, Normal    aspirin EC 81 MG tablet Take 81 mg by mouth daily. , Historical Med    Cholecalciferol (VITAMIN D-3) 1000 units CAPS Take 1 capsule (1,000 Units total) by mouth daily., Starting Tue 11/26/2015, Normal    divalproex (DEPAKOTE) 250 MG DR tablet Take 1 tablet (250 mg total) by mouth 3 (three) times daily., Starting Tue 11/26/2015, Normal    ferrous sulfate 325 (65 FE) MG tablet Take 1 tablet (325 mg total) by mouth 2 (two) times daily with a meal., Starting Tue 11/26/2015, Normal    Fluticasone-Salmeterol (ADVAIR) 250-50 MCG/DOSE AEPB Inhale 1 puff into the lungs 2 (two) times daily., Starting Tue 11/26/2015, Normal    furosemide (LASIX) 40 MG tablet Take 1 tablet (40 mg total) by mouth 2 (two) times daily., Starting Tue 11/26/2015, Normal    gabapentin (NEURONTIN) 400 MG capsule Take 1 capsule (400 mg total) by mouth 2 (two) times daily., Starting Tue 11/26/2015, Normal    levothyroxine (SYNTHROID, LEVOTHROID) 150 MCG tablet TAKE 1 TABLET BY MOUTH DAILY BEFORE BREAKFAST, Normal    lisinopril (PRINIVIL,ZESTRIL) 20 MG tablet Take 1 tablet (20 mg total) by mouth 2  (two) times daily., Starting Tue 11/26/2015, Normal    Melatonin 3 MG TABS Take 1 tablet (3 mg total) by mouth at bedtime., Starting Tue 05/28/2015, Normal    metFORMIN (GLUMETZA) 500 MG (MOD) 24 hr tablet Take 1 tablet (500 mg total) by mouth daily with breakfast., Starting Tue 11/26/2015, Normal    metoprolol (LOPRESSOR) 50 MG tablet Take 0.5 tablets (25 mg total) by mouth 2 (two) times daily., Starting Tue 11/26/2015, Normal    pantoprazole (PROTONIX) 40 MG tablet Take 1 tablet (40 mg total) by mouth 2 (two) times daily., Starting Tue 11/26/2015, Normal    QUEtiapine (SEROQUEL) 25 MG tablet Take 1 tablet (25 mg total) by mouth at bedtime., Starting Tue 11/26/2015, Normal    tiotropium (SPIRIVA) 18 MCG inhalation capsule Place 1 capsule (18 mcg total) into inhaler and inhale daily., Starting Tue 11/26/2015, Normal    venlafaxine XR (EFFEXOR XR) 37.5 MG 24 hr capsule Take 1 capsule (37.5 mg total) by mouth daily with breakfast., Starting Tue 11/26/2015, Normal    donepezil (ARICEPT) 10 MG  tablet Take 1 tablet (10 mg total) by mouth at bedtime., Starting Tue 11/26/2015, Normal    MICROLET LANCETS MISC TEST BLOOD GLUCOSE LEVEL ONCE DAILY, Normal        Today   VITAL SIGNS:  Blood pressure (!) 143/64, pulse 66, temperature 98.6 F (37 C), temperature source Oral, resp. rate 17, height 5\' 7"  (1.702 m), weight 85.7 kg (189 lb), SpO2 95 %.  I/O:  No intake or output data in the 24 hours ending 05/21/16 1627  PHYSICAL EXAMINATION:  Physical Exam  GENERAL:  82 y.o.-year-old patient lying in the bed with no acute distress.  LUNGS: Normal breath sounds bilaterally, no wheezing, rales,rhonchi or crepitation. No use of accessory muscles of respiration.  CARDIOVASCULAR: S1, S2 normal. No murmurs, rubs, or gallops.  ABDOMEN: Soft, non-tender, non-distended. Bowel sounds present. No organomegaly or mass.  NEUROLOGIC: Moves all 4 extremities. PSYCHIATRIC: The patient is alert and oriented x 3.  SKIN: No  obvious rash, lesion, or ulcer.   DATA REVIEW:   CBC  Recent Labs Lab 05/19/16 0546  WBC 7.9  HGB 10.0*  HCT 30.6*  PLT 300    Chemistries   Recent Labs Lab 05/18/16 1016 05/19/16 0546  NA 136 139  K 3.6 3.7  CL 96* 102  CO2 31 31  GLUCOSE 143* 148*  BUN 19 13  CREATININE 0.81 0.81  CALCIUM 8.5* 8.0*  AST 71*  --   ALT 30  --   ALKPHOS 56  --   BILITOT 0.3  --     Cardiac Enzymes  Recent Labs Lab 05/18/16 1016  TROPONINI <0.03    Microbiology Results  Results for orders placed or performed during the hospital encounter of 05/18/16  Blood culture (routine x 2)     Status: None (Preliminary result)   Collection Time: 05/18/16 10:18 AM  Result Value Ref Range Status   Specimen Description BLOOD L AC  Final   Special Requests   Final    BOTTLES DRAWN AEROBIC AND ANAEROBIC AER 5ML ANA 3ML   Culture NO GROWTH 3 DAYS  Final   Report Status PENDING  Incomplete  Blood culture (routine x 2)     Status: None (Preliminary result)   Collection Time: 05/18/16 10:18 AM  Result Value Ref Range Status   Specimen Description BLOOD R AC  Final   Special Requests   Final    BOTTLES DRAWN AEROBIC AND ANAEROBIC AER 5ML ANA 4ML   Culture NO GROWTH 3 DAYS  Final   Report Status PENDING  Incomplete  Urine culture     Status: Abnormal   Collection Time: 05/18/16 10:41 AM  Result Value Ref Range Status   Specimen Description URINE, CATHETERIZED  Final   Special Requests Normal  Final   Culture (A)  Final    <10,000 COLONIES/mL INSIGNIFICANT GROWTH Performed at [facility]    Report Status 05/19/2016 FINAL  Final    RADIOLOGY:  No results found.  Follow up with PCP in 1 week.  Management plans discussed with the patient, family and they are in agreement.  CODE STATUS:  Code Status History    Date Active Date Inactive Code Status Order ID Comments User Context   05/18/2016  1:24 PM 05/20/2016  3:41 PM Full Code [id number]  Loletha Grayer, MD ED    07/18/2015  7:41 AM 07/22/2015  7:46 PM Full Code [id number]  Theodoro Grist, MD ED   05/14/2015  7:37 PM 05/18/2015  1:48 PM Full  Code [id number]  Gladstone Lighter, MD Inpatient    Advance Directive Documentation   Flowsheet Row Most Recent Value  Type of Advance Directive  Healthcare Power of Attorney, Living will  Pre-existing out of facility DNR order (yellow form or pink MOST form)  No data  "MOST" Form in Place?  No data      TOTAL TIME TAKING CARE OF THIS PATIENT ON DAY OF DISCHARGE: more than 30 minutes.   Hillary Bow R M.D on 05/21/2016 at 4:27 PM  Between 7am to 6pm - Pager - [phone]  After 6pm go to [email or web address] - password [email or web address]  Office  [phone]  CC: Primary care physician; [place] Liter, DO  Note: This dictation was prepared with Dragon dictation along with smaller phrase technology. Any transcriptional errors that result from this process are unintentional.

## 2016-05-22 ENCOUNTER — Telehealth: Payer: Self-pay

## 2016-05-22 NOTE — Telephone Encounter (Signed)
They need a verbal ok for her to get home health physical therapy twice a week for 8 weeks.

## 2016-05-22 NOTE — Telephone Encounter (Signed)
Kim with home health notified.

## 2016-05-22 NOTE — Telephone Encounter (Signed)
Ok to give verbal order.

## 2016-05-23 LAB — CULTURE, BLOOD (ROUTINE X 2)
CULTURE: NO GROWTH
Culture: NO GROWTH

## 2016-06-01 ENCOUNTER — Encounter: Payer: Self-pay | Admitting: Family Medicine

## 2016-06-01 ENCOUNTER — Ambulatory Visit (INDEPENDENT_AMBULATORY_CARE_PROVIDER_SITE_OTHER): Payer: Medicare Other | Admitting: Family Medicine

## 2016-06-01 VITALS — BP 132/67 | HR 74 | Wt 191.0 lb

## 2016-06-01 DIAGNOSIS — N3000 Acute cystitis without hematuria: Secondary | ICD-10-CM | POA: Diagnosis not present

## 2016-06-01 DIAGNOSIS — J449 Chronic obstructive pulmonary disease, unspecified: Secondary | ICD-10-CM

## 2016-06-01 DIAGNOSIS — I5031 Acute diastolic (congestive) heart failure: Secondary | ICD-10-CM | POA: Diagnosis not present

## 2016-06-01 DIAGNOSIS — J9601 Acute respiratory failure with hypoxia: Secondary | ICD-10-CM | POA: Diagnosis not present

## 2016-06-01 DIAGNOSIS — E1122 Type 2 diabetes mellitus with diabetic chronic kidney disease: Secondary | ICD-10-CM

## 2016-06-01 DIAGNOSIS — F0151 Vascular dementia with behavioral disturbance: Secondary | ICD-10-CM

## 2016-06-01 DIAGNOSIS — I5022 Chronic systolic (congestive) heart failure: Secondary | ICD-10-CM | POA: Diagnosis not present

## 2016-06-01 DIAGNOSIS — D500 Iron deficiency anemia secondary to blood loss (chronic): Secondary | ICD-10-CM | POA: Diagnosis not present

## 2016-06-01 DIAGNOSIS — I130 Hypertensive heart and chronic kidney disease with heart failure and stage 1 through stage 4 chronic kidney disease, or unspecified chronic kidney disease: Secondary | ICD-10-CM | POA: Diagnosis not present

## 2016-06-01 DIAGNOSIS — N182 Chronic kidney disease, stage 2 (mild): Secondary | ICD-10-CM

## 2016-06-01 DIAGNOSIS — D473 Essential (hemorrhagic) thrombocythemia: Secondary | ICD-10-CM

## 2016-06-01 DIAGNOSIS — Z79899 Other long term (current) drug therapy: Secondary | ICD-10-CM | POA: Diagnosis not present

## 2016-06-01 DIAGNOSIS — E039 Hypothyroidism, unspecified: Secondary | ICD-10-CM | POA: Diagnosis not present

## 2016-06-01 DIAGNOSIS — I129 Hypertensive chronic kidney disease with stage 1 through stage 4 chronic kidney disease, or unspecified chronic kidney disease: Secondary | ICD-10-CM | POA: Diagnosis not present

## 2016-06-01 DIAGNOSIS — F01518 Vascular dementia, unspecified severity, with other behavioral disturbance: Secondary | ICD-10-CM

## 2016-06-01 LAB — HEMOGLOBIN A1C: HEMOGLOBIN A1C: 6.3

## 2016-06-01 NOTE — Assessment & Plan Note (Signed)
Under good control today. Continue current regimen. Continue to monitor.  

## 2016-06-01 NOTE — Assessment & Plan Note (Signed)
Rechecking levels today. Continue to follow with Dr. Grayland Ormond. Call with any concerns.

## 2016-06-01 NOTE — Progress Notes (Signed)
BP 132/67 (BP Location: Left Arm, Patient Position: Sitting, Cuff Size: Large)   Pulse 74   Wt 191 lb (86.6 kg)   SpO2 98%   BMI 29.91 kg/m    Subjective:    Patient ID: Theresa Vasquez, female    DOB: 02/23/1931, 82 y.o.   MRN: [id number]  HPI: Theresa Vasquez is a 82 y.o. female  Chief Complaint  Patient presents with  . Hospitalization Follow-up    UTI   HOSPITAL FOLLOW UP- Jamirrah is here today for  Time since discharge: 12 days Hospital/facility: [facility] Diagnosis: Acute respiratory failure with hypoxia, UTI Procedures/tests: UA, labs, CT stone, CXR Consultants: None New medications: Keflex x1 week Discharge instructions:  Follow up here, home PT Status: better  HOSPITAL COURSE:   *Acute cystitis with hematuria and acute encephalopathy over baseline dementia On IV antibiotics. Urine cultures Negative. Treat with Keflex at discharge. Afebrile. Normal WBC. No dysuria. No hematuria.  * Chronic respiratory failure with interstitial lung disease Has home oxygen, but has not been using it.. Outpatient follow-up with pulmonary.  *History of atrial fibrillation. Continue amiodarone and metoprolol. Aspirin for anticoagulation  *Hypothyroidism unspecified continue levothyroxine  *Dementia without behavioral disturbance. Continue Seroquel at night and Aricept Patient has been pleasant through her hospital stay  *Essential hypertension continue meds  *Type 2 diabetes mellitus on Glucophage as outpatient. Placed on sliding scale while here.  Stable for discharge home. Patient has round the clock caregivers at home. Home health physical therapy set up. Discussed with son on the day of discharge.  HYPERTENSION / HYPERLIPIDEMIA Satisfied with current treatment? yes Duration of hypertension: chronic BP monitoring frequency: not checking BP medication side effects: no Duration of hyperlipidemia: chronic Cholesterol medication side effects:  no Cholesterol supplements: none Medication compliance: good compliance Aspirin: yes Recent stressors: no Recurrent headaches: no Visual changes: no Palpitations: no Dyspnea: no Chest pain: no Lower extremity edema: no Dizzy/lightheaded: no  DIABETES Hypoglycemic episodes:no Polydipsia/polyuria: no Visual disturbance: no Chest pain: no Paresthesias: yes Glucose Monitoring: no Taking Insulin?: no Blood Pressure Monitoring: not checking Retinal Examination: Not up to Date Foot Exam: Up to Date Diabetic Education: Completed Pneumovax: Up to Date Influenza: Up to Date Aspirin: yes  ANEMIA Anemia status: stable Etiology of anemia: gi bleed Duration of anemia treatment: chronic  Compliance with treatment: good compliance Iron supplementation side effects: no Severity of anemia: severe Fatigue: yes Decreased exercise tolerance: yes  Dyspnea on exertion: yes Palpitations: no Bleeding: no Pica: no   Relevant past medical, surgical, family and social history reviewed and updated as indicated. Interim medical history since our last visit reviewed. Allergies and medications reviewed and updated.  Review of Systems  Constitutional: Negative.   Respiratory: Negative.   Cardiovascular: Negative.   Genitourinary: Negative.   Psychiatric/Behavioral: Negative.     Per HPI unless specifically indicated above     Objective:    BP 132/67 (BP Location: Left Arm, Patient Position: Sitting, Cuff Size: Large)   Pulse 74   Wt 191 lb (86.6 kg)   SpO2 98%   BMI 29.91 kg/m   Wt Readings from Last 3 Encounters:  06/01/16 191 lb (86.6 kg)  05/18/16 189 lb (85.7 kg)  11/26/15 194 lb (88 kg)    Physical Exam  Constitutional: She is oriented to person, place, and time. She appears well-developed and well-nourished. No distress.  HENT:  Head: Normocephalic and atraumatic.  Right Ear: Hearing normal.  Left Ear: Hearing normal.  Nose: Nose normal.  Eyes: Conjunctivae and lids  are normal. Right eye exhibits no discharge. Left eye exhibits no discharge. No scleral icterus.  Cardiovascular: Normal rate, regular rhythm and intact distal pulses.  Exam reveals no gallop and no friction rub.   Murmur heard. Pulmonary/Chest: Effort normal and breath sounds normal. No respiratory distress. She has no wheezes. She has no rales. She exhibits no tenderness.  Abdominal: Soft. Bowel sounds are normal. She exhibits no distension and no mass. There is no tenderness. There is no rebound and no guarding.  Musculoskeletal: Normal range of motion.  Neurological: She is alert and oriented to person, place, and time.  Skin: Skin is warm, dry and intact. No rash noted. She is not diaphoretic. No erythema. No pallor.  Psychiatric: She has a normal mood and affect. Her speech is normal and behavior is normal. Judgment and thought content normal. Cognition and memory are normal.  Nursing note and vitals reviewed.   Results for orders placed or performed in visit on 06/01/16  Hemoglobin A1c  Result Value Ref Range   Hemoglobin A1C 6.3       Assessment & Plan:   Problem List Items Addressed This Visit      Cardiovascular and Mediastinum   Type 2 DM with CKD stage 2 and hypertension ([place])    A1c drawn today. Came back at 6.3. Continue current regimen. Continue to monitor. Recheck 3 months.       Relevant Orders   Bayer DCA Hb A1c Waived   Comprehensive metabolic panel   Lipid Panel w/o Chol/HDL Ratio   Microalbumin, Urine Waived   Acute diastolic CHF (congestive heart failure) (HCC)    Following up with Dr. Ubaldo Glassing tomorrow. BP under good control. No fluid overload today. Continue to monitor.       Relevant Orders   Comprehensive metabolic panel   Lipid Panel w/o Chol/HDL Ratio   Chronic systolic heart failure ([place])    Following up with Dr. Ubaldo Glassing tomorrow. BP under good control. No fluid overload today. Continue to monitor.       Relevant Orders   Comprehensive metabolic panel    Lipid Panel w/o Chol/HDL Ratio   Heart & renal disease, hypertensive, with heart failure ([place])    Following up with Dr. Ubaldo Glassing tomorrow. BP under good control. No fluid overload today. Continue to monitor.       Relevant Orders   Comprehensive metabolic panel   Lipid Panel w/o Chol/HDL Ratio     Respiratory   COPD, severe ([place])    Using her oxygen at home. O2 98% today. Continue inhalers. Continue to monitor. Recheck 3 months.       Acute respiratory failure with hypoxia ([place]) - Primary    Using her oxygen at home. O2 98% today. Continue inhalers. Continue to monitor. Recheck 3 months.       Relevant Orders   Comprehensive metabolic panel     Endocrine   Adult hypothyroidism    Rechecking TSH today. Await results and adjust dose as needed.       Relevant Orders   Comprehensive metabolic panel   TSH     Nervous and Auditory   Vascular dementia with behavioral disturbance    Has help in the home. Stable. Following with neurology. Continue to monitor.       Relevant Orders   Comprehensive metabolic panel     Genitourinary   Benign hypertensive renal disease    Under good control today. Continue current regimen. Continue to monitor.  Relevant Orders   Comprehensive metabolic panel   Microalbumin, Urine Waived     Hematopoietic and Hemostatic   Essential thrombocythemia (HCC)    Rechecking levels today. Continue to follow with Dr. Grayland Ormond. Call with any concerns.       Relevant Orders   CBC with Differential/Platelet   Comprehensive metabolic panel     Other   Iron deficiency anemia    Rechecking levels today. Continue to follow with Dr. Grayland Ormond. Call with any concerns.       Relevant Orders   CBC with Differential/Platelet   Comprehensive metabolic panel   Iron and TIBC   Ferritin    Other Visit Diagnoses    Acute cystitis without hematuria       Rechecking UA today. Await results.    Relevant Orders   UA/M w/rflx Culture, Routine    Long-term use of high-risk medication       Checking depakote levels today. Adjust as needed.    Relevant Orders   Valproic Acid level       Follow up plan: Return in about 3 months (around 09/01/2016) for DM visit.

## 2016-06-01 NOTE — Assessment & Plan Note (Signed)
Using her oxygen at home. O2 98% today. Continue inhalers. Continue to monitor. Recheck 3 months.

## 2016-06-01 NOTE — Assessment & Plan Note (Addendum)
A1c drawn today. Came back at 6.3. Continue current regimen. Continue to monitor. Recheck 3 months.

## 2016-06-01 NOTE — Assessment & Plan Note (Signed)
Following up with Dr. Ubaldo Glassing tomorrow. BP under good control. No fluid overload today. Continue to monitor.

## 2016-06-01 NOTE — Assessment & Plan Note (Signed)
Rechecking TSH today. Await results and adjust dose as needed.

## 2016-06-01 NOTE — Assessment & Plan Note (Signed)
Has help in the home. Stable. Following with neurology. Continue to monitor.

## 2016-06-02 ENCOUNTER — Telehealth: Payer: Self-pay | Admitting: Family Medicine

## 2016-06-02 ENCOUNTER — Encounter: Payer: Self-pay | Admitting: Family Medicine

## 2016-06-02 LAB — COMPREHENSIVE METABOLIC PANEL
A/G RATIO: 1.2 (ref 1.2–2.2)
ALBUMIN: 3.6 g/dL (ref 3.5–4.7)
ALK PHOS: 61 IU/L (ref 39–117)
ALT: 23 IU/L (ref 0–32)
AST: 34 IU/L (ref 0–40)
BUN / CREAT RATIO: 18 (ref 12–28)
BUN: 15 mg/dL (ref 8–27)
Bilirubin Total: <0.2 mg/dL (ref 0.0–1.2)
CO2: 27 mmol/L (ref 18–29)
CREATININE: 0.85 mg/dL (ref 0.57–1.00)
Calcium: 8.9 mg/dL (ref 8.7–10.3)
Chloride: 97 mmol/L (ref 96–106)
GFR calc Af Amer: 73 mL/min/{1.73_m2} (ref >59)
GFR calc non Af Amer: 64 mL/min/{1.73_m2} (ref >59)
GLOBULIN, TOTAL: 2.9 g/dL (ref 1.5–4.5)
Glucose: 119 mg/dL — ABNORMAL HIGH (ref 65–99)
POTASSIUM: 4 mmol/L (ref 3.5–5.2)
SODIUM: 143 mmol/L (ref 134–144)
Total Protein: 6.5 g/dL (ref 6.0–8.5)

## 2016-06-02 LAB — CBC WITH DIFFERENTIAL/PLATELET
BASOS: 0 %
Basophils Absolute: 0 10*3/uL (ref 0.0–0.2)
EOS (ABSOLUTE): 0.2 10*3/uL (ref 0.0–0.4)
EOS: 3 %
HEMATOCRIT: 32.5 % — AB (ref 34.0–46.6)
HEMOGLOBIN: 9.8 g/dL — AB (ref 11.1–15.9)
Immature Grans (Abs): 0.1 10*3/uL (ref 0.0–0.1)
Immature Granulocytes: 1 %
LYMPHS ABS: 1.5 10*3/uL (ref 0.7–3.1)
Lymphs: 21 %
MCH: 28 pg (ref 26.6–33.0)
MCHC: 30.2 g/dL — AB (ref 31.5–35.7)
MCV: 93 fL (ref 79–97)
MONOCYTES: 16 %
MONOS ABS: 1.2 10*3/uL — AB (ref 0.1–0.9)
NEUTROS ABS: 4.1 10*3/uL (ref 1.4–7.0)
Neutrophils: 59 %
Platelets: 562 10*3/uL — ABNORMAL HIGH (ref 150–379)
RBC: 3.5 x10E6/uL — ABNORMAL LOW (ref 3.77–5.28)
RDW: 14.1 % (ref 12.3–15.4)
WBC: 7.1 10*3/uL (ref 3.4–10.8)

## 2016-06-02 LAB — IRON AND TIBC
IRON: 334 ug/dL — AB (ref 27–139)
Iron Saturation: >95 % (ref 15–55)
Total Iron Binding Capacity: <351 ug/dL (ref 250–450)

## 2016-06-02 LAB — LIPID PANEL W/O CHOL/HDL RATIO
CHOLESTEROL TOTAL: 245 mg/dL — AB (ref 100–199)
HDL: 34 mg/dL — ABNORMAL LOW (ref >39)
LDL CALC: 163 mg/dL — AB (ref 0–99)
Triglycerides: 242 mg/dL — ABNORMAL HIGH (ref 0–149)
VLDL Cholesterol Cal: 48 mg/dL — ABNORMAL HIGH (ref 5–40)

## 2016-06-02 LAB — VALPROIC ACID LEVEL: VALPROIC ACID LVL: 37 ug/mL — AB (ref 50–100)

## 2016-06-02 LAB — TSH: TSH: 2.4 u[IU]/mL (ref 0.450–4.500)

## 2016-06-02 LAB — FERRITIN: Ferritin: 41 ng/mL (ref 15–150)

## 2016-06-02 NOTE — Telephone Encounter (Signed)
[name] will get patient scheduled to see Dr.Finnegan.

## 2016-06-02 NOTE — Telephone Encounter (Signed)
Left message letting patient's son know the last results and asked him to give me a call and let me know when she is scheduled to go back to see Dr. Grayland Ormond.

## 2016-06-02 NOTE — Telephone Encounter (Signed)
Please let Nicole Kindred know that his mom's labs look stable, but her iron is pretty high- when is she seeing Dr. Grayland Ormond again?

## 2016-06-08 ENCOUNTER — Telehealth: Payer: Self-pay | Admitting: Family Medicine

## 2016-06-08 LAB — UA/M W/RFLX CULTURE, ROUTINE
Bilirubin, UA: NEGATIVE
Glucose, UA: NEGATIVE
Nitrite, UA: NEGATIVE
Specific Gravity, UA: 1.015 (ref 1.005–1.030)
Urobilinogen, Ur: 0.2 mg/dL (ref 0.2–1.0)
pH, UA: 5.5 (ref 5.0–7.5)

## 2016-06-08 LAB — MICROSCOPIC EXAMINATION

## 2016-06-08 LAB — URINE CULTURE, REFLEX

## 2016-06-08 LAB — BAYER DCA HB A1C WAIVED: HB A1C (BAYER DCA - WAIVED): 6.3 % (ref <7.0)

## 2016-06-08 LAB — MICROALBUMIN, URINE WAIVED
CREATININE, URINE WAIVED: 200 mg/dL (ref 10–300)
Microalb, Ur Waived: 150 mg/L — ABNORMAL HIGH (ref 0–19)

## 2016-06-08 MED ORDER — NITROFURANTOIN MONOHYD MACRO 100 MG PO CAPS: 100.0000 mg | ORAL_CAPSULE | Freq: Two times a day (BID) | ORAL | 0 refills | Status: DC

## 2016-06-08 NOTE — Telephone Encounter (Signed)
Please let them know that Theresa Vasquez has another UTI. I've sent through an antibiotic for her. Thanks!

## 2016-06-08 NOTE — Telephone Encounter (Signed)
Voicemail left to let patient's son know that she has a UTI and that he can pick the medication up at the pharmacy.

## 2016-06-10 ENCOUNTER — Other Ambulatory Visit: Payer: Self-pay | Admitting: Family Medicine

## 2016-06-17 ENCOUNTER — Emergency Department: Payer: Medicare Other

## 2016-06-17 ENCOUNTER — Inpatient Hospital Stay
Admission: EM | Admit: 2016-06-17 | Discharge: 2016-06-23 | DRG: 480 | Disposition: A | Discharge status: Skilled Nursing Facility | Payer: Medicare Other | Attending: Internal Medicine | Admitting: Internal Medicine

## 2016-06-17 DIAGNOSIS — E039 Hypothyroidism, unspecified: Secondary | ICD-10-CM | POA: Diagnosis present

## 2016-06-17 DIAGNOSIS — M25552 Pain in left hip: Secondary | ICD-10-CM

## 2016-06-17 DIAGNOSIS — I340 Nonrheumatic mitral (valve) insufficiency: Secondary | ICD-10-CM | POA: Diagnosis present

## 2016-06-17 DIAGNOSIS — J690 Pneumonitis due to inhalation of food and vomit: Secondary | ICD-10-CM | POA: Diagnosis not present

## 2016-06-17 DIAGNOSIS — I700 Atherosclerosis of aorta: Secondary | ICD-10-CM | POA: Diagnosis present

## 2016-06-17 DIAGNOSIS — Z419 Encounter for procedure for purposes other than remedying health state, unspecified: Secondary | ICD-10-CM

## 2016-06-17 DIAGNOSIS — E785 Hyperlipidemia, unspecified: Secondary | ICD-10-CM | POA: Diagnosis present

## 2016-06-17 DIAGNOSIS — D62 Acute posthemorrhagic anemia: Secondary | ICD-10-CM | POA: Diagnosis not present

## 2016-06-17 DIAGNOSIS — R262 Difficulty in walking, not elsewhere classified: Secondary | ICD-10-CM

## 2016-06-17 DIAGNOSIS — F0150 Vascular dementia without behavioral disturbance: Secondary | ICD-10-CM | POA: Diagnosis present

## 2016-06-17 DIAGNOSIS — S72002A Fracture of unspecified part of neck of left femur, initial encounter for closed fracture: Secondary | ICD-10-CM

## 2016-06-17 DIAGNOSIS — I129 Hypertensive chronic kidney disease with stage 1 through stage 4 chronic kidney disease, or unspecified chronic kidney disease: Secondary | ICD-10-CM

## 2016-06-17 DIAGNOSIS — N182 Chronic kidney disease, stage 2 (mild): Secondary | ICD-10-CM | POA: Diagnosis present

## 2016-06-17 DIAGNOSIS — Y92009 Unspecified place in unspecified non-institutional (private) residence as the place of occurrence of the external cause: Secondary | ICD-10-CM

## 2016-06-17 DIAGNOSIS — E1122 Type 2 diabetes mellitus with diabetic chronic kidney disease: Secondary | ICD-10-CM | POA: Diagnosis present

## 2016-06-17 DIAGNOSIS — R0602 Shortness of breath: Secondary | ICD-10-CM

## 2016-06-17 DIAGNOSIS — I130 Hypertensive heart and chronic kidney disease with heart failure and stage 1 through stage 4 chronic kidney disease, or unspecified chronic kidney disease: Secondary | ICD-10-CM | POA: Diagnosis present

## 2016-06-17 DIAGNOSIS — F329 Major depressive disorder, single episode, unspecified: Secondary | ICD-10-CM | POA: Diagnosis present

## 2016-06-17 DIAGNOSIS — W010XXA Fall on same level from slipping, tripping and stumbling without subsequent striking against object, initial encounter: Secondary | ICD-10-CM | POA: Diagnosis present

## 2016-06-17 DIAGNOSIS — D638 Anemia in other chronic diseases classified elsewhere: Secondary | ICD-10-CM | POA: Diagnosis present

## 2016-06-17 DIAGNOSIS — J449 Chronic obstructive pulmonary disease, unspecified: Secondary | ICD-10-CM | POA: Diagnosis present

## 2016-06-17 DIAGNOSIS — J9602 Acute respiratory failure with hypercapnia: Secondary | ICD-10-CM | POA: Diagnosis not present

## 2016-06-17 DIAGNOSIS — M810 Age-related osteoporosis without current pathological fracture: Secondary | ICD-10-CM | POA: Diagnosis present

## 2016-06-17 DIAGNOSIS — Z79899 Other long term (current) drug therapy: Secondary | ICD-10-CM

## 2016-06-17 DIAGNOSIS — S72142A Displaced intertrochanteric fracture of left femur, initial encounter for closed fracture: Principal | ICD-10-CM | POA: Diagnosis present

## 2016-06-17 DIAGNOSIS — F419 Anxiety disorder, unspecified: Secondary | ICD-10-CM | POA: Diagnosis present

## 2016-06-17 DIAGNOSIS — I480 Paroxysmal atrial fibrillation: Secondary | ICD-10-CM | POA: Diagnosis present

## 2016-06-17 DIAGNOSIS — Z8711 Personal history of peptic ulcer disease: Secondary | ICD-10-CM | POA: Diagnosis not present

## 2016-06-17 DIAGNOSIS — Z7982 Long term (current) use of aspirin: Secondary | ICD-10-CM

## 2016-06-17 DIAGNOSIS — J9601 Acute respiratory failure with hypoxia: Secondary | ICD-10-CM | POA: Diagnosis not present

## 2016-06-17 DIAGNOSIS — E1142 Type 2 diabetes mellitus with diabetic polyneuropathy: Secondary | ICD-10-CM | POA: Diagnosis present

## 2016-06-17 DIAGNOSIS — K2270 Barrett's esophagus without dysplasia: Secondary | ICD-10-CM | POA: Diagnosis present

## 2016-06-17 DIAGNOSIS — M6281 Muscle weakness (generalized): Secondary | ICD-10-CM

## 2016-06-17 DIAGNOSIS — Z87891 Personal history of nicotine dependence: Secondary | ICD-10-CM

## 2016-06-17 DIAGNOSIS — E1151 Type 2 diabetes mellitus with diabetic peripheral angiopathy without gangrene: Secondary | ICD-10-CM | POA: Diagnosis present

## 2016-06-17 DIAGNOSIS — Z7984 Long term (current) use of oral hypoglycemic drugs: Secondary | ICD-10-CM

## 2016-06-17 DIAGNOSIS — I5032 Chronic diastolic (congestive) heart failure: Secondary | ICD-10-CM | POA: Diagnosis present

## 2016-06-17 LAB — CBC WITH DIFFERENTIAL/PLATELET
Basophils Absolute: 0.1 10*3/uL (ref 0–0.1)
Basophils Relative: 1 %
Eosinophils Absolute: 0.2 10*3/uL (ref 0–0.7)
Eosinophils Relative: 3 %
HEMATOCRIT: 31.4 % — AB (ref 35.0–47.0)
HEMOGLOBIN: 10.4 g/dL — AB (ref 12.0–16.0)
LYMPHS ABS: 1.5 10*3/uL (ref 1.0–3.6)
LYMPHS PCT: 20 %
MCH: 29.7 pg (ref 26.0–34.0)
MCHC: 33 g/dL (ref 32.0–36.0)
MCV: 89.9 fL (ref 80.0–100.0)
MONOS PCT: 15 %
Monocytes Absolute: 1.1 10*3/uL — ABNORMAL HIGH (ref 0.2–0.9)
NEUTROS PCT: 61 %
Neutro Abs: 4.7 10*3/uL (ref 1.4–6.5)
Platelets: 367 10*3/uL (ref 150–440)
RBC: 3.49 MIL/uL — AB (ref 3.80–5.20)
RDW: 15 % — ABNORMAL HIGH (ref 11.5–14.5)
WBC: 7.6 10*3/uL (ref 3.6–11.0)

## 2016-06-17 LAB — BASIC METABOLIC PANEL
Anion gap: 9 (ref 5–15)
BUN: 18 mg/dL (ref 6–20)
CHLORIDE: 98 mmol/L — AB (ref 101–111)
CO2: 32 mmol/L (ref 22–32)
Calcium: 8.2 mg/dL — ABNORMAL LOW (ref 8.9–10.3)
Creatinine, Ser: 0.82 mg/dL (ref 0.44–1.00)
GFR calc non Af Amer: >60 mL/min (ref >60)
Glucose, Bld: 236 mg/dL — ABNORMAL HIGH (ref 65–99)
POTASSIUM: 3.8 mmol/L (ref 3.5–5.1)
SODIUM: 139 mmol/L (ref 135–145)

## 2016-06-17 MED ORDER — MORPHINE SULFATE (PF) 4 MG/ML IV SOLN
4.0000 mg | Freq: Once | INTRAVENOUS | Status: AC
  Administered 2016-06-17: 4 mg via INTRAVENOUS

## 2016-06-17 MED ORDER — MORPHINE SULFATE (PF) 4 MG/ML IV SOLN
4.0000 mg | Freq: Once | INTRAVENOUS | Status: AC
  Administered 2016-06-17: 4 mg via INTRAVENOUS
  Filled 2016-06-17: qty 1

## 2016-06-17 MED ORDER — MORPHINE SULFATE (PF) 4 MG/ML IV SOLN
INTRAVENOUS | Status: AC
  Administered 2016-06-17: 4 mg via INTRAVENOUS
  Filled 2016-06-17: qty 1

## 2016-06-17 MED ORDER — ONDANSETRON HCL 4 MG/2ML IJ SOLN
4.0000 mg | Freq: Once | INTRAMUSCULAR | Status: AC
  Administered 2016-06-17: 4 mg via INTRAVENOUS
  Filled 2016-06-17: qty 2

## 2016-06-17 NOTE — ED Provider Notes (Signed)
[facility] Emergency Department Provider Note  ____________________________________________  Time seen: Approximately 9:03 PM  I have reviewed the triage vital signs and the nursing notes.   HISTORY  Chief Complaint Hip Pain and Fall    HPI Theresa Vasquez is a 82 y.o. female who complains of left hip pain after a slip and fall onto her left at home. She reports she lost her balance and fell on a hardwood floor. Denies any other significant injuries. No head injury or loss of consciousness.     Past Medical History:  Diagnosis Date  . Adenomatous polyps   . Age-related osteoporosis without current pathological fracture   . Anemia   . Anxiety   . Atherosclerosis of aorta ([place])   . Atrial fibrillation with RVR ([place])   . Barrett's esophagus without dysplasia   . CHF (congestive heart failure) ([place])   . Colitis   . COPD (chronic obstructive pulmonary disease) ([place])   . Dementia   . Depression   . Diabetes mellitus type II, uncontrolled ([place])   . Diverticulitis   . Diverticulosis of intestine without perforation or abscess without bleeding   . Edema   . Essential hemorrhagic thrombocythemia ([place])   . Gastrointestinal hemorrhage   . High anion gap metabolic acidosis   . Hyperlipidemia   . Hypertension   . Hypertensive chronic kidney disease with stage 1 through stage 4 chronic kidney disease, or unspecified chronic kidney disease   . Hypothyroidism   . Hypoxemia   . Iron deficiency anemia   . Lactic acidosis   . Left bundle branch block   . Nonrheumatic mitral valve insufficiency   . Osteoarthritis   . Other psychoactive substance dependence, in remission ([place])   . Other specified symptoms and signs involving the circulatory and respiratory systems   . Paroxysmal atrial fibrillation (HCC)    s/p cardioversion  . Personal history of peptic ulcer disease   . Pulmonary edema   . PVD (peripheral vascular disease) ([place]) 05/14/2015  .  Recurrent pneumonia    history of  . Respiratory failure with hypoxia and hypercapnia ([place])   . Type 2 diabetes mellitus with diabetic chronic kidney disease ([place])   . Type 2 diabetes mellitus with diabetic polyneuropathy ([place])   . Vascular dementia without behavioral disturbance   . Vascular disorder of intestine [facility])      Patient Active Problem List   Diagnosis Date Noted  . Acute respiratory failure with hypoxia ([place]) 05/18/2016  . Benign neoplasm 12/27/2015  . PNA (pneumonia) 07/18/2015  . Acute diastolic CHF (congestive heart failure) ([place])   . Paroxysmal atrial fibrillation ([place]) 05/28/2015  . Acute GI bleeding   . Type 2 DM with CKD stage 2 and hypertension ([place]) 05/14/2015  . COPD, severe ([place]) 05/14/2015  . History of pneumonia 05/14/2015  . Benign hypertensive renal disease 05/14/2015  . Anxiety disorder 05/14/2015  . Diabetic polyneuropathy ([place]) 05/14/2015  . Diverticulosis 05/14/2015  . Abnormality of gait 05/14/2015  . Barrett's esophagus 05/14/2015  . Drug addiction in remission ([place]) 05/14/2015  . Mitral regurgitation 05/14/2015  . LBBB (left bundle branch block) 05/14/2015  . Carotid bruit 05/14/2015  . Ischemic colitis ([place]) 05/14/2015  . History of peptic ulcer disease 05/14/2015  . CHF (congestive heart failure) ([place]) 05/14/2015  . Atherosclerosis of aorta ([place]) 05/14/2015  . Involutional osteoporosis 05/14/2015  . Diverticulitis 05/14/2015  . Essential thrombocythemia ([place]) 05/14/2015  . H/O recurrent pneumonia 05/14/2015  . Heart & renal disease,  hypertensive, with heart failure ([place]) 05/14/2015  . Adult hypothyroidism 05/14/2015  . Hypoxemia 05/14/2015  . Non-rheumatic mitral regurgitation 05/14/2015  . Other specified symptoms and signs involving the circulatory and respiratory systems 05/14/2015  . Psychoactive substance dependence ([place]) 05/14/2015  . Peripheral vascular disease ([place]) 05/14/2015  . Vascular dementia with behavioral disturbance  05/14/2015  . Vascular disorder of intestine ([place]) 05/14/2015  . Iron deficiency anemia 11/24/2014  . Chronic systolic heart failure ([place]) 06/05/2014  . Lung edema 06/05/2014  . Alveolar hypoventilation 06/05/2014  . Arthritis, degenerative 02/20/2014     Past Surgical History:  Procedure Laterality Date  . ABDOMINAL HYSTERECTOMY     partial  . APPENDECTOMY    . COLONOSCOPY    . ESOPHAGOGASTRODUODENOSCOPY    . ESOPHAGOGASTRODUODENOSCOPY (EGD) WITH PROPOFOL N/A 07/04/2015   Procedure: ESOPHAGOGASTRODUODENOSCOPY (EGD) WITH PROPOFOL;  Surgeon: Manya Silvas, MD;  Location: [facility] ENDOSCOPY;  Service: Endoscopy;  Laterality: N/A;  . SHOULDER ARTHROSCOPY    . SIGMOIDOSCOPY       Prior to Admission medications   Medication Sig Start Date End Date Taking? Authorizing Provider  acetaminophen (TYLENOL) 500 MG tablet Take 500 mg by mouth every 4 (four) hours as needed.    Historical Provider, MD  albuterol (PROVENTIL) (2.5 MG/3ML) 0.083% nebulizer solution Take 3 mLs (2.5 mg total) by nebulization every 4 (four) hours as needed for wheezing or shortness of breath. 11/26/15   Megan P Johnson, DO  amiodarone (PACERONE) 200 MG tablet Take 100 mg by mouth daily.     Historical Provider, MD  amLODipine (NORVASC) 5 MG tablet Take 1 tablet (5 mg total) by mouth 2 (two) times daily. 11/26/15   Megan P Johnson, DO  aspirin EC 81 MG tablet Take 81 mg by mouth daily.     Historical Provider, MD  Cholecalciferol (VITAMIN D-3) 1000 units CAPS Take 1 capsule (1,000 Units total) by mouth daily. 11/26/15   Megan P Johnson, DO  divalproex (DEPAKOTE) 250 MG DR tablet Take 1 tablet (250 mg total) by mouth 3 (three) times daily. 11/26/15   Megan P Johnson, DO  donepezil (ARICEPT) 10 MG tablet Take 1 tablet (10 mg total) by mouth at bedtime. 11/26/15   Megan P Johnson, DO  ferrous sulfate 325 (65 FE) MG tablet Take 1 tablet (325 mg total) by mouth 2 (two) times daily with a meal. Patient taking differently: Take 325 mg  by mouth daily.  11/26/15   Megan P Johnson, DO  Fluticasone-Salmeterol (ADVAIR) 250-50 MCG/DOSE AEPB Inhale 1 puff into the lungs 2 (two) times daily. 11/26/15   Megan P Johnson, DO  furosemide (LASIX) 40 MG tablet Take 1 tablet (40 mg total) by mouth 2 (two) times daily. 11/26/15   Megan P Johnson, DO  gabapentin (NEURONTIN) 400 MG capsule Take 1 capsule (400 mg total) by mouth 2 (two) times daily. 11/26/15   Megan P Johnson, DO  levothyroxine (SYNTHROID, LEVOTHROID) 150 MCG tablet TAKE 1 TABLET BY MOUTH DAILY BEFORE BREAKFAST 05/12/16   Megan P Johnson, DO  lisinopril (PRINIVIL,ZESTRIL) 20 MG tablet Take 1 tablet (20 mg total) by mouth 2 (two) times daily. 11/26/15   Megan P Johnson, DO  Melatonin 3 MG TABS Take 1 tablet (3 mg total) by mouth at bedtime. 05/28/15   Megan P Johnson, DO  metFORMIN (GLUCOPHAGE) 500 MG tablet TAKE ONE TABLET EVERY DAY WITH BREAKFAST 06/12/16   Megan P Johnson, DO  metFORMIN (GLUMETZA) 500 MG (MOD) 24 hr tablet Take 1 tablet (500  mg total) by mouth daily with breakfast. 11/26/15   Megan P Johnson, DO  metoprolol (LOPRESSOR) 50 MG tablet Take 0.5 tablets (25 mg total) by mouth 2 (two) times daily. 11/26/15   Megan P Johnson, DO  MICROLET LANCETS MISC TEST BLOOD GLUCOSE LEVEL ONCE DAILY 04/10/16   Megan P Johnson, DO  nitrofurantoin, macrocrystal-monohydrate, (MACROBID) 100 MG capsule Take 1 capsule (100 mg total) by mouth 2 (two) times daily. 06/08/16   Megan P Johnson, DO  pantoprazole (PROTONIX) 40 MG tablet Take 1 tablet (40 mg total) by mouth 2 (two) times daily. 11/26/15   Megan P Johnson, DO  QUEtiapine (SEROQUEL) 25 MG tablet Take 1 tablet (25 mg total) by mouth at bedtime. 11/26/15   Megan P Johnson, DO  tiotropium (SPIRIVA) 18 MCG inhalation capsule Place 1 capsule (18 mcg total) into inhaler and inhale daily. 11/26/15   Megan P Johnson, DO  venlafaxine XR (EFFEXOR XR) 37.5 MG 24 hr capsule Take 1 capsule (37.5 mg total) by mouth daily with breakfast. 11/26/15   Valerie Roys, DO      Allergies Patient has no known allergies.   Family History  Problem Relation Age of Onset  . Diabetes Mother   . Diabetes Father     Social History Social History  Substance Use Topics  . Smoking status: Former Smoker    Packs/day: 1.00    Years: 60.00    Types: Cigarettes  . Smokeless tobacco: Never Used  . Alcohol use No    Review of Systems  Constitutional:   No fever or chills.  ENT:   No sore throat. No rhinorrhea. Cardiovascular:   No chest pain. Respiratory:   No dyspnea or cough. Gastrointestinal:   Negative for abdominal pain, vomiting and diarrhea.  Genitourinary:   Negative for dysuria or difficulty urinating. Musculoskeletal:   Left hip pain Neurological:   Negative for headaches 10-point ROS otherwise negative.  ____________________________________________   PHYSICAL EXAM:  VITAL SIGNS: ED Triage Vitals  Enc Vitals Group     BP 06/17/16 1957 (!) 142/59     Pulse Rate 06/17/16 1957 76     Resp 06/17/16 1957 10     Temp 06/17/16 1957 97.1 F (36.2 C)     Temp Source 06/17/16 1957 Oral     SpO2 06/17/16 1957 95 %     Weight 06/17/16 1958 191 lb (86.6 kg)     Height --      Head Circumference --      Peak Flow --      Pain Score 06/17/16 2000 5     Pain Loc --      Pain Edu? --      Excl. in [place]? --     Vital signs reviewed, nursing assessments reviewed.   Constitutional:   Alert and oriented. Well appearing and in no distress. Eyes:   No scleral icterus. No conjunctival pallor. PERRL. EOMI.  No nystagmus. ENT   Head:   Normocephalic and atraumatic.   Nose:   No congestion/rhinnorhea. No septal hematoma   Mouth/Throat:   MMM, no pharyngeal erythema. No peritonsillar mass.    Neck:   No stridor. No SubQ emphysema. No meningismus. Hematological/Lymphatic/Immunilogical:   No cervical lymphadenopathy. Cardiovascular:   RRR. Symmetric bilateral radial and DP pulses.  No murmurs.  Respiratory:   Normal respiratory effort  without tachypnea nor retractions. Breath sounds are clear and equal bilaterally. No wheezes/rales/rhonchi. Gastrointestinal:   Soft and nontender. Non distended. There is no  CVA tenderness.  No rebound, rigidity, or guarding. Genitourinary:   deferred Musculoskeletal:   Left hip tenderness at the proximal femur with pain with range of motion. Extremities otherwise unremarkable. No wounds.. Neurologic:   Normal speech and language.  CN 2-10 normal. Motor grossly intact. No gross focal neurologic deficits are appreciated.  Skin:    Skin is warm, dry and intact. No rash noted.  No petechiae, purpura, or bullae.  ____________________________________________    LABS (pertinent positives/negatives) (all labs ordered are listed, but only abnormal results are displayed) Labs Reviewed  BASIC METABOLIC PANEL - Abnormal; Notable for the following:       Result Value   Chloride 98 (*)    Glucose, Bld 236 (*)    Calcium 8.2 (*)    All other components within normal limits  CBC WITH DIFFERENTIAL/PLATELET - Abnormal; Notable for the following:    RBC 3.49 (*)    Hemoglobin 10.4 (*)    HCT 31.4 (*)    RDW 15.0 (*)    Monocytes Absolute 1.1 (*)    All other components within normal limits   ____________________________________________   EKG    ____________________________________________    RADIOLOGY  Chest x-ray unremarkable X-ray left hip reveals intertrochanteric fracture.  ____________________________________________   PROCEDURES Procedures  ____________________________________________   INITIAL IMPRESSION / ASSESSMENT AND PLAN / ED COURSE  Pertinent labs & imaging results that were available during my care of the patient were reviewed by me and considered in my medical decision making (see chart for details).  Patient not in distress, normal vital signs, left hip pain after fall. Found to have a hip fracture. I will admit to hospitalist for medical optimization and  surgical planning. Discussed with ortho Dr. Marry Guan     Clinical Course    ____________________________________________   FINAL CLINICAL IMPRESSION(S) / ED DIAGNOSES  Final diagnoses:  Closed left hip fracture, initial encounter [facility])       Portions of this note were generated with dragon dictation software. Dictation errors may occur despite best attempts at proofreading.    Carrie Mew, MD 06/17/16 2110

## 2016-06-17 NOTE — Consult Note (Signed)
ORTHOPAEDIC CONSULTATION  PATIENT NAME: Theresa Vasquez DOB: Mar 10, 1931  MRN: [id number]  REQUESTING PHYSICIAN: Carrie Mew, MD  Chief Complaint: Left hip pain  HPI: Theresa Vasquez is a 82 y.o. female who complains of  severe left hip pain. The patient tripped and fell at home, landing on her left hip and side. She had the immediate onset of severe left hip pain and was unable stand or bear weight due to the pain. Prior to the fall she was ambulating with a walker. She denied any other injuries other than a contusion to the left forearm. She denied any loss of consciousness.  Of note, the patient uses oxygen at nighttime.  Past Medical History:  Diagnosis Date  . Adenomatous polyps   . Age-related osteoporosis without current pathological fracture   . Anemia   . Anxiety   . Atherosclerosis of aorta ([place])   . Atrial fibrillation with RVR ([place])   . Barrett's esophagus without dysplasia   . CHF (congestive heart failure) ([place])   . Colitis   . COPD (chronic obstructive pulmonary disease) ([place])   . Dementia   . Depression   . Diabetes mellitus type II, uncontrolled ([place])   . Diverticulitis   . Diverticulosis of intestine without perforation or abscess without bleeding   . Edema   . Essential hemorrhagic thrombocythemia ([place])   . Gastrointestinal hemorrhage   . High anion gap metabolic acidosis   . Hyperlipidemia   . Hypertension   . Hypertensive chronic kidney disease with stage 1 through stage 4 chronic kidney disease, or unspecified chronic kidney disease   . Hypothyroidism   . Hypoxemia   . Iron deficiency anemia   . Lactic acidosis   . Left bundle branch block   . Nonrheumatic mitral valve insufficiency   . Osteoarthritis   . Other psychoactive substance dependence, in remission ([place])   . Other specified symptoms and signs involving the circulatory and respiratory systems   . Paroxysmal atrial fibrillation (HCC)    s/p cardioversion  . Personal history of  peptic ulcer disease   . Pulmonary edema   . PVD (peripheral vascular disease) ([place]) 05/14/2015  . Recurrent pneumonia    history of  . Respiratory failure with hypoxia and hypercapnia ([place])   . Type 2 diabetes mellitus with diabetic chronic kidney disease ([place])   . Type 2 diabetes mellitus with diabetic polyneuropathy ([place])   . Vascular dementia without behavioral disturbance   . Vascular disorder of intestine [facility])    Past Surgical History:  Procedure Laterality Date  . ABDOMINAL HYSTERECTOMY     partial  . APPENDECTOMY    . COLONOSCOPY    . ESOPHAGOGASTRODUODENOSCOPY    . ESOPHAGOGASTRODUODENOSCOPY (EGD) WITH PROPOFOL N/A 07/04/2015   Procedure: ESOPHAGOGASTRODUODENOSCOPY (EGD) WITH PROPOFOL;  Surgeon: Manya Silvas, MD;  Location[facility] END[facility]COPY;  Service: Endoscopy;  Laterality: N/A;  . SHOULDER ARTHROSCOPY    . SIGMOIDOSCOPY     Social History   Social History  . Marital status: Divorced    Spouse name: N/A  . Number of children: N/A  . Years of education: N/A   Social History Main Topics  . Smoking status: Former Smoker    Packs/day: 1.00    Years: 60.00    Types: Cigarettes  . Smokeless tobacco: Never Used  . Alcohol use No  . Drug use: No  . Sexual activity: Not Asked   Other Topics Concern  . None   Social History Narrative   Lives at  home, has 24 x 7 caregiver and ambulates with walker.   Family History  Problem Relation Age of Onset  . Diabetes Mother   . Diabetes Father    No Known Allergies Prior to Admission medications   Medication Sig Start Date End Date Taking? Authorizing Provider  acetaminophen (TYLENOL) 500 MG tablet Take 500 mg by mouth every 4 (four) hours as needed.    Historical Provider, MD  albuterol (PROVENTIL) (2.5 MG/3ML) 0.083% nebulizer solution Take 3 mLs (2.5 mg total) by nebulization every 4 (four) hours as needed for wheezing or shortness of breath. 11/26/15   Megan P Johnson, DO  amiodarone (PACERONE) 200 MG tablet Take  100 mg by mouth daily.     Historical Provider, MD  amLODipine (NORVASC) 5 MG tablet Take 1 tablet (5 mg total) by mouth 2 (two) times daily. 11/26/15   Megan P Johnson, DO  aspirin EC 81 MG tablet Take 81 mg by mouth daily.     Historical Provider, MD  Cholecalciferol (VITAMIN D-3) 1000 units CAPS Take 1 capsule (1,000 Units total) by mouth daily. 11/26/15   Megan P Johnson, DO  divalproex (DEPAKOTE) 250 MG DR tablet Take 1 tablet (250 mg total) by mouth 3 (three) times daily. 11/26/15   Megan P Johnson, DO  donepezil (ARICEPT) 10 MG tablet Take 1 tablet (10 mg total) by mouth at bedtime. 11/26/15   Megan P Johnson, DO  ferrous sulfate 325 (65 FE) MG tablet Take 1 tablet (325 mg total) by mouth 2 (two) times daily with a meal. Patient taking differently: Take 325 mg by mouth daily.  11/26/15   Megan P Johnson, DO  Fluticasone-Salmeterol (ADVAIR) 250-50 MCG/DOSE AEPB Inhale 1 puff into the lungs 2 (two) times daily. 11/26/15   Megan P Johnson, DO  furosemide (LASIX) 40 MG tablet Take 1 tablet (40 mg total) by mouth 2 (two) times daily. 11/26/15   Megan P Johnson, DO  gabapentin (NEURONTIN) 400 MG capsule Take 1 capsule (400 mg total) by mouth 2 (two) times daily. 11/26/15   Megan P Johnson, DO  levothyroxine (SYNTHROID, LEVOTHROID) 150 MCG tablet TAKE 1 TABLET BY MOUTH DAILY BEFORE BREAKFAST 05/12/16   Megan P Johnson, DO  lisinopril (PRINIVIL,ZESTRIL) 20 MG tablet Take 1 tablet (20 mg total) by mouth 2 (two) times daily. 11/26/15   Megan P Johnson, DO  Melatonin 3 MG TABS Take 1 tablet (3 mg total) by mouth at bedtime. 05/28/15   Megan P Johnson, DO  metFORMIN (GLUCOPHAGE) 500 MG tablet TAKE ONE TABLET EVERY DAY WITH BREAKFAST 06/12/16   Megan P Johnson, DO  metFORMIN (GLUMETZA) 500 MG (MOD) 24 hr tablet Take 1 tablet (500 mg total) by mouth daily with breakfast. 11/26/15   Megan P Johnson, DO  metoprolol (LOPRESSOR) 50 MG tablet Take 0.5 tablets (25 mg total) by mouth 2 (two) times daily. 11/26/15   Megan P Johnson, DO   MICROLET LANCETS MISC TEST BLOOD GLUCOSE LEVEL ONCE DAILY 04/10/16   Megan P Johnson, DO  nitrofurantoin, macrocrystal-monohydrate, (MACROBID) 100 MG capsule Take 1 capsule (100 mg total) by mouth 2 (two) times daily. 06/08/16   Megan P Johnson, DO  pantoprazole (PROTONIX) 40 MG tablet Take 1 tablet (40 mg total) by mouth 2 (two) times daily. 11/26/15   Megan P Johnson, DO  QUEtiapine (SEROQUEL) 25 MG tablet Take 1 tablet (25 mg total) by mouth at bedtime. 11/26/15   Megan P Johnson, DO  tiotropium (SPIRIVA) 18 MCG inhalation capsule Place 1 capsule (  18 mcg total) into inhaler and inhale daily. 11/26/15   Megan P Johnson, DO  venlafaxine XR (EFFEXOR XR) 37.5 MG 24 hr capsule Take 1 capsule (37.5 mg total) by mouth daily with breakfast. 11/26/15   Valerie Roys, DO   Dg Chest 1 View  Result Date: 06/17/2016 CLINICAL DATA:  Recent fall with hip pain, initial encounter EXAM: CHEST 1 VIEW COMPARISON:  05/18/2016 FINDINGS: Cardiac shadow is mildly enlarged. Aortic calcifications are again seen. Lungs are well aerated bilaterally without focal infiltrate or sizable effusion. No acute bony abnormality is seen. Degenerative changes of the thoracic spine are noted. IMPRESSION: No acute abnormality noted. Electronically Signed   By: Inez Catalina M.D.   On: 06/17/2016 20:52   Dg Hip Unilat W Or Wo Pelvis 2-3 Views Left  Result Date: 06/17/2016 CLINICAL DATA:  Recent fall with left hip pain, initial encounter EXAM: DG HIP (WITH OR WITHOUT PELVIS) 2-3V LEFT COMPARISON:  None. FINDINGS: There is a comminuted intratrochanteric fracture with some impaction and angulation at the fracture site. Pelvic ring is intact. No gross soft tissue abnormality is seen. IMPRESSION: Left intratrochanteric femoral fracture Electronically Signed   By: Inez Catalina M.D.   On: 06/17/2016 20:53    Positive ROS: All other systems have been reviewed and were otherwise negative with the exception of those mentioned in the HPI and as  above.  Physical Exam: General: Alert and alert in no acute distress. HEENT: Atraumatic and normocephalic. Sclera are clear. Extraocular motion is intact. Oropharynx is clear with moist mucosa.The patient is hard of hearing. Neck: Supple, nontender, good range of motion. No JVD or carotid bruits. Lungs: Clear to auscultation bilaterally. Cardiovascular: Regular rate and rhythm with normal S1 and S2. 2/6 murmur. No gallops or rubs. Pedal pulses are faintly palpable bilaterally. Homans test is negative bilaterally. No significant pretibial or ankle edema. Abdomen: Soft, nontender, and nondistended. Bowel sounds are present. Skin: There is an area of ecchymosis noted to the left lower leg. No erythema. Neurologic: Awake, alert, and oriented. Sensory function is grossly intact. Motor strength is felt to be 5 over 5 bilaterally with the exception of the left lower extremity which was not assessed due to the injury. No clonus or tremor. Good motor coordination. Lymphatic: No axillary or cervical lymphadenopathy  MUSCULOSKELETAL: No gross tenderness to palpation about the upper extremities or right lower extremity. Examination of the left lower extremity was performed and demonstrates shortening and rotation of the left lower extremity. Pain is elicited with any attempted passive range of motion of the hip. No gross tenderness to palpation about the ankle or knee. No knee effusion.  Assessment: Displaced left intertrochanteric femur fracture  Plan: The findings were discussed in detail with the patient and her family. Recommendation was made for open reduction and internal fixation of the left intertrochanteric femur fracture. The usual perioperative course was discussed. The risks and benefits of surgical intervention were reviewed. The patient expressed understanding of the risks and benefits and agreed with plans for surgical intervention.   Medical optimization as per the hospitalist  service.  The surgical site was signed as per the "right site surgery" protocol.   Keilani Terrance P. Holley Bouche M.D.

## 2016-06-17 NOTE — ED Notes (Signed)
Pt skin tears on L arm irrigated and cleaned. Non-adherent pad placed over skin tears and wrapped in gauze.

## 2016-06-17 NOTE — ED Triage Notes (Signed)
Pt BIB [facility] after a fall at home. States she lost her balance. Caretaker called EMS. Pt L hip pain, shortened and externally rotated. EMS states pt has high pain tolerance. Skin tears to L arm. Denies hitting head, no LOC. Fell on [employer].   Hx dementia.

## 2016-06-17 NOTE — ED Notes (Signed)
Pt oxygen dropped into 70's. Placed on 2 L nasal cannula and oxygen came up to 90's.   Caretaker stated that pt wears oxygen at night.

## 2016-06-17 NOTE — H&P (Signed)
[facility] [place] NAME: Theresa Vasquez    MR#:  [id number]  DATE OF BIRTH:  [birth year]  DATE OF ADMISSION:  06/17/2016  PRIMARY CARE PHYSICIAN: Park Liter, DO   REQUESTING/REFERRING PHYSICIAN: Joni Fears, MD  CHIEF COMPLAINT:   Chief Complaint  Patient presents with  . Hip Pain  . Fall    HISTORY OF PRESENT ILLNESS:  Theresa Vasquez  is a 82 y.o. female who presents with Mechanical fall and left hip fracture. Patient was standing up from a seated position, and was unable to stabilize herself appropriately and fell to her left side. Hip fracture confirmed on x-ray here in the ED. Hospitalists called for admission with orthopedics consulted.  PAST MEDICAL HISTORY:   Past Medical History:  Diagnosis Date  . Adenomatous polyps   . Age-related osteoporosis without current pathological fracture   . Anemia   . Anxiety   . Atherosclerosis of aorta ([place])   . Atrial fibrillation with RVR ([place])   . Barrett's esophagus without dysplasia   . CHF (congestive heart failure) ([place])   . Colitis   . COPD (chronic obstructive pulmonary disease) ([place])   . Dementia   . Depression   . Diabetes mellitus type II, uncontrolled ([place])   . Diverticulitis   . Diverticulosis of intestine without perforation or abscess without bleeding   . Edema   . Essential hemorrhagic thrombocythemia ([place])   . Gastrointestinal hemorrhage   . High anion gap metabolic acidosis   . Hyperlipidemia   . Hypertension   . Hypertensive chronic kidney disease with stage 1 through stage 4 chronic kidney disease, or unspecified chronic kidney disease   . Hypothyroidism   . Hypoxemia   . Iron deficiency anemia   . Lactic acidosis   . Left bundle branch block   . Nonrheumatic mitral valve insufficiency   . Osteoarthritis   . Other psychoactive substance dependence, in remission ([place])   . Other specified symptoms and signs involving the circulatory and respiratory  systems   . Paroxysmal atrial fibrillation (HCC)    s/p cardioversion  . Personal history of peptic ulcer disease   . Pulmonary edema   . PVD (peripheral vascular disease) ([place]) 05/14/2015  . Recurrent pneumonia    history of  . Respiratory failure with hypoxia and hypercapnia ([place])   . Type 2 diabetes mellitus with diabetic chronic kidney disease ([place])   . Type 2 diabetes mellitus with diabetic polyneuropathy ([place])   . Vascular dementia without behavioral disturbance   . Vascular disorder of intestine ([place])     PAST SURGICAL HISTORY:   Past Surgical History:  Procedure Laterality Date  . ABDOMINAL HYSTERECTOMY     partial  . APPENDECTOMY    . COLONOSCOPY    . ESOPHAGOGASTRODUODENOSCOPY    . ESOPHAGOGASTRODUODENOSCOPY (EGD) WITH PROPOFOL N/A 07/04/2015   Procedure: ESOPHAGOGASTRODUODENOSCOPY (EGD) WITH PROPOFOL;  Surgeon: Manya Silvas, MD;  Location: [facility] ENDOSCOPY;  Service: Endoscopy;  Laterality: N/A;  . SHOULDER ARTHROSCOPY    . SIGMOIDOSCOPY      SOCIAL HISTORY:   Social History  Substance Use Topics  . Smoking status: Former Smoker    Packs/day: 1.00    Years: 60.00    Types: Cigarettes  . Smokeless tobacco: Never Used  . Alcohol use No    FAMILY HISTORY:   Family History  Problem Relation Age of Onset  . Diabetes Mother   . Diabetes Father     DRUG ALLERGIES:  No Known Allergies  MEDICATIONS AT HOME:   Prior to Admission medications   Medication Sig Start Date End Date Taking? Authorizing Provider  acetaminophen (TYLENOL) 500 MG tablet Take 500 mg by mouth every 4 (four) hours as needed.   Yes Historical Provider, MD  albuterol (PROVENTIL) (2.5 MG/3ML) 0.083% nebulizer solution Take 3 mLs (2.5 mg total) by nebulization every 4 (four) hours as needed for wheezing or shortness of breath. 11/26/15  Yes Megan P Johnson, DO  amiodarone (PACERONE) 200 MG tablet Take 100 mg by mouth daily.    Yes Historical Provider, MD  amLODipine (NORVASC) 5 MG tablet  Take 1 tablet (5 mg total) by mouth 2 (two) times daily. 11/26/15  Yes Megan P Johnson, DO  aspirin EC 81 MG tablet Take 81 mg by mouth daily.    Yes Historical Provider, MD  Cholecalciferol (VITAMIN D-3) 1000 units CAPS Take 1 capsule (1,000 Units total) by mouth daily. 11/26/15  Yes Megan P Johnson, DO  divalproex (DEPAKOTE) 250 MG DR tablet Take 1 tablet (250 mg total) by mouth 3 (three) times daily. 11/26/15  Yes Megan P Johnson, DO  donepezil (ARICEPT) 10 MG tablet Take 1 tablet (10 mg total) by mouth at bedtime. 11/26/15  Yes Megan P Johnson, DO  ferrous sulfate 325 (65 FE) MG tablet Take 1 tablet (325 mg total) by mouth 2 (two) times daily with a meal. Patient taking differently: Take 325 mg by mouth daily.  11/26/15  Yes Megan P Johnson, DO  Fluticasone-Salmeterol (ADVAIR) 250-50 MCG/DOSE AEPB Inhale 1 puff into the lungs 2 (two) times daily. 11/26/15  Yes Megan P Johnson, DO  furosemide (LASIX) 40 MG tablet Take 1 tablet (40 mg total) by mouth 2 (two) times daily. 11/26/15  Yes Megan P Johnson, DO  gabapentin (NEURONTIN) 400 MG capsule Take 1 capsule (400 mg total) by mouth 2 (two) times daily. 11/26/15  Yes Megan P Johnson, DO  levothyroxine (SYNTHROID, LEVOTHROID) 150 MCG tablet TAKE 1 TABLET BY MOUTH DAILY BEFORE BREAKFAST 05/12/16  Yes Megan P Johnson, DO  lisinopril (PRINIVIL,ZESTRIL) 20 MG tablet Take 1 tablet (20 mg total) by mouth 2 (two) times daily. 11/26/15  Yes Megan P Johnson, DO  Melatonin 3 MG TABS Take 1 tablet (3 mg total) by mouth at bedtime. 05/28/15  Yes Megan P Johnson, DO  metFORMIN (GLUCOPHAGE) 500 MG tablet TAKE ONE TABLET EVERY DAY WITH BREAKFAST 06/12/16  Yes Megan P Johnson, DO  metoprolol (LOPRESSOR) 50 MG tablet Take 0.5 tablets (25 mg total) by mouth 2 (two) times daily. 11/26/15  Yes Megan P Johnson, DO  MICROLET LANCETS MISC TEST BLOOD GLUCOSE LEVEL ONCE DAILY 04/10/16  Yes Megan P Johnson, DO  pantoprazole (PROTONIX) 40 MG tablet Take 1 tablet (40 mg total) by mouth 2 (two) times  daily. Patient taking differently: Take 40 mg by mouth daily.  11/26/15  Yes Megan P Johnson, DO  QUEtiapine (SEROQUEL) 25 MG tablet Take 1 tablet (25 mg total) by mouth at bedtime. 11/26/15  Yes Megan P Johnson, DO  tiotropium (SPIRIVA) 18 MCG inhalation capsule Place 1 capsule (18 mcg total) into inhaler and inhale daily. 11/26/15  Yes Megan P Johnson, DO  venlafaxine XR (EFFEXOR XR) 37.5 MG 24 hr capsule Take 1 capsule (37.5 mg total) by mouth daily with breakfast. 11/26/15  Yes Megan P Johnson, DO  metFORMIN (GLUMETZA) 500 MG (MOD) 24 hr tablet Take 1 tablet (500 mg total) by mouth daily with breakfast. 11/26/15   Valerie Roys,  DO  nitrofurantoin, macrocrystal-monohydrate, (MACROBID) 100 MG capsule Take 1 capsule (100 mg total) by mouth 2 (two) times daily. Patient not taking: Reported on 06/17/2016 06/08/16   Valerie Roys, DO    REVIEW OF SYSTEMS:  Review of Systems  Constitutional: Negative for chills, fever, malaise/fatigue and weight loss.  HENT: Negative for ear pain, hearing loss and tinnitus.   Eyes: Negative for blurred vision, double vision, pain and redness.  Respiratory: Negative for cough, hemoptysis and shortness of breath.   Cardiovascular: Negative for chest pain, palpitations, orthopnea and leg swelling.  Gastrointestinal: Negative for abdominal pain, constipation, diarrhea, nausea and vomiting.  Genitourinary: Negative for dysuria, frequency and hematuria.  Musculoskeletal: Positive for falls and joint pain (left hip3). Negative for back pain and neck pain.  Skin:       No acne, rash, or lesions  Neurological: Negative for dizziness, tremors, focal weakness and weakness.  Endo/Heme/Allergies: Negative for polydipsia. Does not bruise/bleed easily.  Psychiatric/Behavioral: Negative for depression. The patient is not nervous/anxious and does not have insomnia.      VITAL SIGNS:   Vitals:   06/17/16 2105 06/17/16 2107 06/17/16 2130 06/17/16 2317  BP:   (!) 111/48 127/87    Pulse: 72 72 75 78  Resp: 14 16 12 16   Temp:      TempSrc:      SpO2: (!) 75% 95% 97% 98%  Weight:       Wt Readings from Last 3 Encounters:  06/17/16 86.6 kg (191 lb)  06/01/16 86.6 kg (191 lb)  05/18/16 85.7 kg (189 lb)    PHYSICAL EXAMINATION:  Physical Exam  Vitals reviewed. Constitutional: She appears well-developed and well-nourished. No distress.  HENT:  Head: Normocephalic and atraumatic.  Mouth/Throat: Oropharynx is clear and moist.  Eyes: Conjunctivae and EOM are normal. Pupils are equal, round, and reactive to light. No scleral icterus.  Neck: Normal range of motion. Neck supple. No JVD present. No thyromegaly present.  Cardiovascular: Normal rate, regular rhythm and intact distal pulses.  Exam reveals no gallop and no friction rub.   No murmur heard. Respiratory: Effort normal and breath sounds normal. No respiratory distress. She has no wheezes. She has no rales.  GI: Soft. Bowel sounds are normal. She exhibits no distension. There is no tenderness.  Musculoskeletal: She exhibits tenderness (Left hip). She exhibits no edema.  No arthritis, no gout  Lymphadenopathy:    She has no cervical adenopathy.  Neurological: She is alert. No cranial nerve deficit.  Patient has some dementia, and so was not completely oriented to circumstance. No dysarthria, no aphasia  Skin: Skin is warm and dry. No rash noted. No erythema.  Psychiatric: She has a normal mood and affect. Her behavior is normal. Judgment and thought content normal.    LABORATORY PANEL:   CBC  Recent Labs Lab 06/17/16 2004  WBC 7.6  HGB 10.4*  HCT 31.4*  PLT 367   ------------------------------------------------------------------------------------------------------------------  Chemistries   Recent Labs Lab 06/17/16 2004  NA 139  K 3.8  CL 98*  CO2 32  GLUCOSE 236*  BUN 18  CREATININE 0.82  CALCIUM 8.2*    ------------------------------------------------------------------------------------------------------------------  Cardiac Enzymes No results for input(s): TROPONINI in the last 168 hours. ------------------------------------------------------------------------------------------------------------------  RADIOLOGY:  Dg Chest 1 View  Result Date: 06/17/2016 CLINICAL DATA:  Recent fall with hip pain, initial encounter EXAM: CHEST 1 VIEW COMPARISON:  05/18/2016 FINDINGS: Cardiac shadow is mildly enlarged. Aortic calcifications are again seen. Lungs are well aerated bilaterally  without focal infiltrate or sizable effusion. No acute bony abnormality is seen. Degenerative changes of the thoracic spine are noted. IMPRESSION: No acute abnormality noted. Electronically Signed   By: Inez Catalina M.D.   On: 06/17/2016 20:52   Dg Hip Unilat W Or Wo Pelvis 2-3 Views Left  Result Date: 06/17/2016 CLINICAL DATA:  Recent fall with left hip pain, initial encounter EXAM: DG HIP (WITH OR WITHOUT PELVIS) 2-3V LEFT COMPARISON:  None. FINDINGS: There is a comminuted intratrochanteric fracture with some impaction and angulation at the fracture site. Pelvic ring is intact. No gross soft tissue abnormality is seen. IMPRESSION: Left intratrochanteric femoral fracture Electronically Signed   By: Inez Catalina M.D.   On: 06/17/2016 20:53    EKG:   Orders placed or performed during the hospital encounter of 05/18/16  . ED EKG  . ED EKG  . EKG 12-Lead  . EKG 12-Lead    IMPRESSION AND PLAN:  Principal Problem:   Closed left hip fracture ([place]) - orthopedics consult, pain control ordered, cardiac risk stratification as below. Patient has one risk factor, with a history of heart failure.   Active Problems:   Type 2 DM with CKD stage 2 and hypertension (HCC) - sliding scale insulin with corresponding glucose checks   COPD, severe (HCC) - continue home inhalers   Anxiety disorder - home dose anxiolytics   Chronic  diastolic CHF (congestive heart failure) (HCC) - none in exacerbation, continue home meds   Paroxysmal atrial fibrillation ([place]) - continue home meds   Adult hypothyroidism - home dose thyroid replacement  All the records are reviewed and case discussed with ED provider. Management plans discussed with the patient and/or family.  DVT PROPHYLAXIS: Mechanical only  GI PROPHYLAXIS: PPI  ADMISSION STATUS: Inpatient  CODE STATUS: Full Code Status History    Date Active Date Inactive Code Status Order ID Comments User Context   05/18/2016  1:24 PM 05/20/2016  3:41 PM Full Code [id number]  Loletha Grayer, MD ED   07/18/2015  7:41 AM 07/22/2015  7:46 PM Full Code [id number]  Theodoro Grist, MD ED   05/14/2015  7:37 PM 05/18/2015  1:48 PM Full Code [id number]  Gladstone Lighter, MD Inpatient      TOTAL TIME TAKING CARE OF THIS PATIENT: 45 minutes.    Dino Borntreger [place] 06/17/2016, 11:37 PM  [facility]  Office  [phone]  CC: Primary care physician; Park Liter, DO

## 2016-06-18 ENCOUNTER — Encounter
Admission: EM | Disposition: A | Discharge status: Skilled Nursing Facility | Payer: Self-pay | Source: Home / Self Care | Attending: Internal Medicine

## 2016-06-18 ENCOUNTER — Inpatient Hospital Stay: Payer: Medicare Other | Admitting: Anesthesiology

## 2016-06-18 ENCOUNTER — Encounter: Payer: Self-pay | Admitting: Anesthesiology

## 2016-06-18 ENCOUNTER — Inpatient Hospital Stay: Payer: Medicare Other

## 2016-06-18 HISTORY — PX: INTRAMEDULLARY (IM) NAIL INTERTROCHANTERIC: SHX5875

## 2016-06-18 LAB — CBC
HCT: 29.2 % — ABNORMAL LOW (ref 35.0–47.0)
Hemoglobin: 9.4 g/dL — ABNORMAL LOW (ref 12.0–16.0)
MCH: 28.6 pg (ref 26.0–34.0)
MCHC: 32.1 g/dL (ref 32.0–36.0)
MCV: 89 fL (ref 80.0–100.0)
PLATELETS: 344 10*3/uL (ref 150–440)
RBC: 3.28 MIL/uL — AB (ref 3.80–5.20)
RDW: 14.9 % — ABNORMAL HIGH (ref 11.5–14.5)
WBC: 9.8 10*3/uL (ref 3.6–11.0)

## 2016-06-18 LAB — BASIC METABOLIC PANEL
ANION GAP: 7 (ref 5–15)
BUN: 17 mg/dL (ref 6–20)
CHLORIDE: 102 mmol/L (ref 101–111)
CO2: 32 mmol/L (ref 22–32)
CREATININE: 0.94 mg/dL (ref 0.44–1.00)
Calcium: 8 mg/dL — ABNORMAL LOW (ref 8.9–10.3)
GFR calc non Af Amer: 55 mL/min — ABNORMAL LOW (ref >60)
Glucose, Bld: 230 mg/dL — ABNORMAL HIGH (ref 65–99)
Potassium: 4 mmol/L (ref 3.5–5.1)
SODIUM: 141 mmol/L (ref 135–145)

## 2016-06-18 LAB — GLUCOSE, CAPILLARY
GLUCOSE-CAPILLARY: 216 mg/dL — AB (ref 65–99)
Glucose-Capillary: 171 mg/dL — ABNORMAL HIGH (ref 65–99)
Glucose-Capillary: 127 mg/dL — ABNORMAL HIGH (ref 65–99)
Glucose-Capillary: 205 mg/dL — ABNORMAL HIGH (ref 65–99)

## 2016-06-18 LAB — SURGICAL PCR SCREEN
MRSA, PCR: NEGATIVE
Staphylococcus aureus: NEGATIVE

## 2016-06-18 SURGERY — FIXATION, FRACTURE, INTERTROCHANTERIC, WITH INTRAMEDULLARY ROD
Anesthesia: General | Laterality: Left

## 2016-06-18 SURGERY — OPEN REDUCTION INTERNAL FIXATION (ORIF) ANKLE FRACTURE
Anesthesia: Choice | Laterality: Left

## 2016-06-18 MED ORDER — SODIUM CHLORIDE 0.9 % IV SOLN
INTRAVENOUS | Status: DC
  Administered 2016-06-18 (×2): via INTRAVENOUS

## 2016-06-18 MED ORDER — OXYCODONE HCL 5 MG PO TABS
5.0000 mg | ORAL_TABLET | ORAL | Status: DC | PRN
  Administered 2016-06-18 (×2): 5 mg via ORAL
  Filled 2016-06-18 (×3): qty 1

## 2016-06-18 MED ORDER — PHENOL 1.4 % MT LIQD
1.0000 | OROMUCOSAL | Status: DC | PRN
  Filled 2016-06-18: qty 177

## 2016-06-18 MED ORDER — ACETAMINOPHEN 325 MG PO TABS: 650.0000 mg | ORAL_TABLET | Freq: Four times a day (QID) | ORAL | Status: DC | PRN

## 2016-06-18 MED ORDER — OXYCODONE HCL 5 MG PO TABS: 5.0000 mg | ORAL_TABLET | ORAL | Status: DC | PRN

## 2016-06-18 MED ORDER — CYCLOBENZAPRINE HCL 10 MG PO TABS
5.0000 mg | ORAL_TABLET | Freq: Once | ORAL | Status: AC
  Administered 2016-06-18: 5 mg via ORAL
  Filled 2016-06-18: qty 1

## 2016-06-18 MED ORDER — ACETAMINOPHEN 650 MG RE SUPP: 650.0000 mg | Freq: Four times a day (QID) | RECTAL | Status: DC | PRN

## 2016-06-18 MED ORDER — TIOTROPIUM BROMIDE MONOHYDRATE 18 MCG IN CAPS
18.0000 ug | ORAL_CAPSULE | Freq: Every day | RESPIRATORY_TRACT | Status: DC
  Administered 2016-06-18 – 2016-06-23 (×5): 18 ug via RESPIRATORY_TRACT
  Filled 2016-06-18: qty 5

## 2016-06-18 MED ORDER — MOMETASONE FURO-FORMOTEROL FUM 200-5 MCG/ACT IN AERO
2.0000 | INHALATION_SPRAY | Freq: Two times a day (BID) | RESPIRATORY_TRACT | Status: DC
  Administered 2016-06-18 – 2016-06-23 (×10): 2 via RESPIRATORY_TRACT
  Filled 2016-06-18: qty 8.8

## 2016-06-18 MED ORDER — PHENYLEPHRINE HCL 10 MG/ML IJ SOLN
INTRAMUSCULAR | Status: DC | PRN
  Administered 2016-06-18 (×7): 100 ug via INTRAVENOUS

## 2016-06-18 MED ORDER — METOCLOPRAMIDE HCL 5 MG/ML IJ SOLN
5.0000 mg | Freq: Three times a day (TID) | INTRAMUSCULAR | Status: DC | PRN
Start: 2016-06-18 — End: 2016-06-23

## 2016-06-18 MED ORDER — ASPIRIN EC 81 MG PO TBEC
81.0000 mg | DELAYED_RELEASE_TABLET | Freq: Every day | ORAL | Status: DC
  Administered 2016-06-19 – 2016-06-23 (×5): 81 mg via ORAL
  Filled 2016-06-18 (×5): qty 1

## 2016-06-18 MED ORDER — ACETAMINOPHEN 10 MG/ML IV SOLN
1000.0000 mg | Freq: Four times a day (QID) | INTRAVENOUS | Status: AC
  Administered 2016-06-19 (×3): 1000 mg via INTRAVENOUS
  Filled 2016-06-18 (×4): qty 100

## 2016-06-18 MED ORDER — CEFAZOLIN SODIUM-DEXTROSE 2-4 GM/100ML-% IV SOLN
2.0000 g | INTRAVENOUS | Status: AC
  Administered 2016-06-18: 2 g via INTRAVENOUS
  Filled 2016-06-18: qty 100

## 2016-06-18 MED ORDER — AMLODIPINE BESYLATE 5 MG PO TABS
5.0000 mg | ORAL_TABLET | Freq: Two times a day (BID) | ORAL | Status: DC
  Administered 2016-06-19: 5 mg via ORAL
  Filled 2016-06-18: qty 1

## 2016-06-18 MED ORDER — VENLAFAXINE HCL ER 37.5 MG PO CP24
37.5000 mg | ORAL_CAPSULE | Freq: Every day | ORAL | Status: DC
  Administered 2016-06-19 – 2016-06-23 (×5): 37.5 mg via ORAL
  Filled 2016-06-18 (×5): qty 1

## 2016-06-18 MED ORDER — KETOROLAC TROMETHAMINE 30 MG/ML IJ SOLN
30.0000 mg | Freq: Once | INTRAMUSCULAR | Status: AC
  Administered 2016-06-18: 30 mg via INTRAVENOUS
  Filled 2016-06-18: qty 1

## 2016-06-18 MED ORDER — MORPHINE SULFATE (PF) 2 MG/ML IV SOLN: 2.0000 mg | INTRAVENOUS | Status: DC | PRN

## 2016-06-18 MED ORDER — ALBUTEROL SULFATE (2.5 MG/3ML) 0.083% IN NEBU
2.5000 mg | INHALATION_SOLUTION | RESPIRATORY_TRACT | Status: DC | PRN
  Administered 2016-06-20: 2.5 mg via RESPIRATORY_TRACT
  Filled 2016-06-18: qty 3

## 2016-06-18 MED ORDER — ENOXAPARIN SODIUM 40 MG/0.4ML ~~LOC~~ SOLN
40.0000 mg | SUBCUTANEOUS | Status: DC
  Administered 2016-06-19 – 2016-06-23 (×5): 40 mg via SUBCUTANEOUS
  Filled 2016-06-18 (×5): qty 0.4

## 2016-06-18 MED ORDER — CEFAZOLIN SODIUM-DEXTROSE 2-4 GM/100ML-% IV SOLN
2.0000 g | Freq: Four times a day (QID) | INTRAVENOUS | Status: AC
Start: 2016-06-18 — End: 2016-06-19
  Administered 2016-06-19 (×4): 2 g via INTRAVENOUS
  Filled 2016-06-18 (×4): qty 100

## 2016-06-18 MED ORDER — DIVALPROEX SODIUM 250 MG PO DR TAB
250.0000 mg | DELAYED_RELEASE_TABLET | Freq: Three times a day (TID) | ORAL | Status: DC
  Administered 2016-06-18 – 2016-06-23 (×15): 250 mg via ORAL
  Filled 2016-06-18 (×16): qty 1

## 2016-06-18 MED ORDER — PANTOPRAZOLE SODIUM 40 MG PO TBEC: 40.0000 mg | DELAYED_RELEASE_TABLET | Freq: Every day | ORAL | Status: DC

## 2016-06-18 MED ORDER — FENTANYL CITRATE (PF) 100 MCG/2ML IJ SOLN
INTRAMUSCULAR | Status: AC
  Administered 2016-06-18: 25 ug via INTRAVENOUS
  Filled 2016-06-18: qty 2

## 2016-06-18 MED ORDER — ONDANSETRON HCL 4 MG/2ML IJ SOLN
INTRAMUSCULAR | Status: DC | PRN
  Administered 2016-06-18: 4 mg via INTRAVENOUS

## 2016-06-18 MED ORDER — METOCLOPRAMIDE HCL 10 MG PO TABS
5.0000 mg | ORAL_TABLET | Freq: Three times a day (TID) | ORAL | Status: DC | PRN
  Administered 2016-06-21: 10 mg via ORAL
  Filled 2016-06-18: qty 1

## 2016-06-18 MED ORDER — LISINOPRIL 20 MG PO TABS
20.0000 mg | ORAL_TABLET | Freq: Two times a day (BID) | ORAL | Status: DC
  Administered 2016-06-19: 20 mg via ORAL
  Filled 2016-06-18: qty 1

## 2016-06-18 MED ORDER — ACETAMINOPHEN 10 MG/ML IV SOLN
INTRAVENOUS | Status: DC | PRN
  Administered 2016-06-18: 1000 mg via INTRAVENOUS

## 2016-06-18 MED ORDER — FENTANYL CITRATE (PF) 100 MCG/2ML IJ SOLN
INTRAMUSCULAR | Status: DC | PRN
  Administered 2016-06-18 (×2): 50 ug via INTRAVENOUS

## 2016-06-18 MED ORDER — FUROSEMIDE 40 MG PO TABS
40.0000 mg | ORAL_TABLET | Freq: Two times a day (BID) | ORAL | Status: DC
  Administered 2016-06-19: 40 mg via ORAL
  Filled 2016-06-18: qty 1

## 2016-06-18 MED ORDER — ONDANSETRON HCL 4 MG PO TABS: 4.0000 mg | ORAL_TABLET | Freq: Four times a day (QID) | ORAL | Status: DC | PRN

## 2016-06-18 MED ORDER — METOCLOPRAMIDE HCL 10 MG PO TABS
10.0000 mg | ORAL_TABLET | Freq: Three times a day (TID) | ORAL | Status: DC
  Administered 2016-06-19 – 2016-06-20 (×7): 10 mg via ORAL
  Filled 2016-06-18 (×8): qty 1

## 2016-06-18 MED ORDER — SODIUM CHLORIDE 0.9 % IV SOLN: INTRAVENOUS | Status: DC

## 2016-06-18 MED ORDER — SENNOSIDES-DOCUSATE SODIUM 8.6-50 MG PO TABS
1.0000 | ORAL_TABLET | Freq: Two times a day (BID) | ORAL | Status: DC
  Administered 2016-06-19 – 2016-06-23 (×9): 1 via ORAL
  Filled 2016-06-18 (×9): qty 1

## 2016-06-18 MED ORDER — PANTOPRAZOLE SODIUM 40 MG PO TBEC
40.0000 mg | DELAYED_RELEASE_TABLET | Freq: Two times a day (BID) | ORAL | Status: DC
  Administered 2016-06-18 – 2016-06-23 (×10): 40 mg via ORAL
  Filled 2016-06-18 (×10): qty 1

## 2016-06-18 MED ORDER — FENTANYL CITRATE (PF) 100 MCG/2ML IJ SOLN
25.0000 ug | INTRAMUSCULAR | Status: DC | PRN
  Administered 2016-06-18 (×4): 25 ug via INTRAVENOUS

## 2016-06-18 MED ORDER — INSULIN ASPART 100 UNIT/ML ~~LOC~~ SOLN
0.0000 [IU] | Freq: Four times a day (QID) | SUBCUTANEOUS | Status: DC
  Administered 2016-06-18 – 2016-06-19 (×3): 2 [IU] via SUBCUTANEOUS
  Filled 2016-06-18 (×3): qty 2

## 2016-06-18 MED ORDER — OXYCODONE HCL 5 MG PO TABS
5.0000 mg | ORAL_TABLET | ORAL | Status: DC | PRN
  Administered 2016-06-19 – 2016-06-23 (×13): 5 mg via ORAL
  Filled 2016-06-18 (×15): qty 1

## 2016-06-18 MED ORDER — FLEET ENEMA 7-19 GM/118ML RE ENEM: 1.0000 | ENEMA | Freq: Once | RECTAL | Status: DC | PRN

## 2016-06-18 MED ORDER — SUGAMMADEX SODIUM 200 MG/2ML IV SOLN
INTRAVENOUS | Status: DC | PRN
  Administered 2016-06-18: 180 mg via INTRAVENOUS

## 2016-06-18 MED ORDER — ROCURONIUM BROMIDE 100 MG/10ML IV SOLN
INTRAVENOUS | Status: DC | PRN
  Administered 2016-06-18: 30 mg via INTRAVENOUS
  Administered 2016-06-18: 20 mg via INTRAVENOUS

## 2016-06-18 MED ORDER — BISACODYL 10 MG RE SUPP
10.0000 mg | Freq: Every day | RECTAL | Status: DC | PRN
  Administered 2016-06-21: 10 mg via RECTAL
  Filled 2016-06-18: qty 1

## 2016-06-18 MED ORDER — SUCCINYLCHOLINE CHLORIDE 20 MG/ML IJ SOLN
INTRAMUSCULAR | Status: DC | PRN
  Administered 2016-06-18: 100 mg via INTRAVENOUS

## 2016-06-18 MED ORDER — LEVOTHYROXINE SODIUM 75 MCG PO TABS
150.0000 ug | ORAL_TABLET | Freq: Every day | ORAL | Status: DC
  Administered 2016-06-19 – 2016-06-23 (×4): 150 ug via ORAL
  Filled 2016-06-18 (×5): qty 2

## 2016-06-18 MED ORDER — ONDANSETRON HCL 4 MG/2ML IJ SOLN: 4.0000 mg | Freq: Once | INTRAMUSCULAR | Status: DC | PRN

## 2016-06-18 MED ORDER — FERROUS SULFATE 325 (65 FE) MG PO TABS
325.0000 mg | ORAL_TABLET | Freq: Two times a day (BID) | ORAL | Status: DC
  Administered 2016-06-19 – 2016-06-23 (×8): 325 mg via ORAL
  Filled 2016-06-18 (×8): qty 1

## 2016-06-18 MED ORDER — DONEPEZIL HCL 5 MG PO TABS
10.0000 mg | ORAL_TABLET | Freq: Every day | ORAL | Status: DC
  Administered 2016-06-18 – 2016-06-22 (×5): 10 mg via ORAL
  Filled 2016-06-18 (×5): qty 2

## 2016-06-18 MED ORDER — SODIUM CHLORIDE 0.9 % IV SOLN
INTRAVENOUS | Status: DC | PRN
  Administered 2016-06-18: 18:00:00 via INTRAVENOUS

## 2016-06-18 MED ORDER — PROPOFOL 10 MG/ML IV BOLUS
INTRAVENOUS | Status: DC | PRN
  Administered 2016-06-18: 120 mg via INTRAVENOUS

## 2016-06-18 MED ORDER — LIDOCAINE HCL (CARDIAC) 20 MG/ML IV SOLN
INTRAVENOUS | Status: DC | PRN
  Administered 2016-06-18: 100 mg via INTRAVENOUS

## 2016-06-18 MED ORDER — AMIODARONE HCL 200 MG PO TABS
100.0000 mg | ORAL_TABLET | Freq: Every day | ORAL | Status: DC
  Administered 2016-06-19 – 2016-06-23 (×5): 100 mg via ORAL
  Filled 2016-06-18 (×5): qty 1

## 2016-06-18 MED ORDER — SODIUM CHLORIDE 0.9 % IV SOLN
INTRAVENOUS | Status: DC
  Administered 2016-06-18: 09:00:00 via INTRAVENOUS

## 2016-06-18 MED ORDER — MENTHOL 3 MG MT LOZG
1.0000 | LOZENGE | OROMUCOSAL | Status: DC | PRN
  Filled 2016-06-18: qty 9

## 2016-06-18 MED ORDER — MAGNESIUM HYDROXIDE 400 MG/5ML PO SUSP
30.0000 mL | Freq: Every day | ORAL | Status: DC | PRN
  Administered 2016-06-20: 30 mL via ORAL
  Filled 2016-06-18 (×2): qty 30

## 2016-06-18 MED ORDER — TRAMADOL HCL 50 MG PO TABS
50.0000 mg | ORAL_TABLET | ORAL | Status: DC | PRN
  Administered 2016-06-18 – 2016-06-19 (×2): 50 mg via ORAL
  Administered 2016-06-19: 100 mg via ORAL
  Administered 2016-06-19: 50 mg via ORAL
  Administered 2016-06-20 – 2016-06-23 (×4): 100 mg via ORAL
  Filled 2016-06-18: qty 1
  Filled 2016-06-18: qty 2
  Filled 2016-06-18: qty 1
  Filled 2016-06-18: qty 2
  Filled 2016-06-18: qty 1
  Filled 2016-06-18 (×3): qty 2

## 2016-06-18 MED ORDER — ONDANSETRON HCL 4 MG/2ML IJ SOLN: 4.0000 mg | Freq: Four times a day (QID) | INTRAMUSCULAR | Status: DC | PRN

## 2016-06-18 SURGICAL SUPPLY — 35 items
CANISTER SUCT 1200ML W/VALVE (MISCELLANEOUS) ×3 IMPLANT
DRAPE C-ARMOR (DRAPES) ×3 IMPLANT
DRAPE SHEET LG 3/4 BI-LAMINATE (DRAPES) ×3 IMPLANT
DRAPE TABLE BACK 80X90 (DRAPES) ×3 IMPLANT
DRSG DERMACEA 8X12 NADH (GAUZE/BANDAGES/DRESSINGS) ×3 IMPLANT
DRSG OPSITE POSTOP 3X4 (GAUZE/BANDAGES/DRESSINGS) ×3 IMPLANT
DRSG OPSITE POSTOP 4X12 (GAUZE/BANDAGES/DRESSINGS) ×3 IMPLANT
DRSG OPSITE POSTOP 4X6 (GAUZE/BANDAGES/DRESSINGS) ×3 IMPLANT
DRSG OPSITE POSTOP 4X8 (GAUZE/BANDAGES/DRESSINGS) ×3 IMPLANT
DURAPREP 26ML APPLICATOR (WOUND CARE) ×3 IMPLANT
ELECT REM PT RETURN 9FT ADLT (ELECTROSURGICAL) ×3
ELECTRODE REM PT RTRN 9FT ADLT (ELECTROSURGICAL) ×1 IMPLANT
GAUZE SPONGE 4X4 12PLY STRL (GAUZE/BANDAGES/DRESSINGS) ×3 IMPLANT
GLOVE BIO SURGEON STRL SZ7.5 (GLOVE) ×9 IMPLANT
GLOVE BIO SURGEON STRL SZ8 (GLOVE) ×9 IMPLANT
GLOVE BIOGEL M STRL SZ7.5 (GLOVE) ×6 IMPLANT
GLOVE INDICATOR 8.0 STRL GRN (GLOVE) ×3 IMPLANT
GOWN STRL REUS W/ TWL LRG LVL3 (GOWN DISPOSABLE) ×2 IMPLANT
GOWN STRL REUS W/TWL LRG LVL3 (GOWN DISPOSABLE) ×4
KIT RM TURNOVER CYSTO AR (KITS) ×3 IMPLANT
MAT BLUE FLOOR 46X72 FLO (MISCELLANEOUS) ×3 IMPLANT
NAIL TROCH FX 11/130D 170-S (Nail) ×3 IMPLANT
NS IRRIG 1000ML POUR BTL (IV SOLUTION) ×3 IMPLANT
PACK HIP COMPR (MISCELLANEOUS) ×3 IMPLANT
REAMER ROD DEEP FLUTE 2.5X950 (INSTRUMENTS) ×3 IMPLANT
SOL PREP PVP 2OZ (MISCELLANEOUS) ×3
SOLUTION PREP PVP 2OZ (MISCELLANEOUS) ×1 IMPLANT
STAPLER SKIN PROX 35W (STAPLE) ×3 IMPLANT
SUCTION FRAZIER HANDLE 10FR (MISCELLANEOUS) ×2
SUCTION TUBE FRAZIER 10FR DISP (MISCELLANEOUS) ×1 IMPLANT
SUT VIC AB 0 CT1 36 (SUTURE) ×3 IMPLANT
SUT VIC AB 1 CT1 36 (SUTURE) ×3 IMPLANT
SUT VIC AB 2-0 CT1 27 (SUTURE) ×2
SUT VIC AB 2-0 CT1 TAPERPNT 27 (SUTURE) ×1 IMPLANT
TAPE MICROFOAM 4IN (TAPE) IMPLANT

## 2016-06-18 NOTE — Brief Op Note (Signed)
06/17/2016 - 06/18/2016  8:55 PM  PATIENT:  Theresa Vasquez  82 y.o. female  PRE-OPERATIVE DIAGNOSIS:  Left intertrochanteric femur fracture  POST-OPERATIVE DIAGNOSIS:  Same  PROCEDURE:  Procedure(s): INTRAMEDULLARY (IM) NAIL INTERTROCHANTRIC (Left)  SURGEON:  Surgeon(s) and Role:    * Dereck Leep, MD - Primary  ASSISTANTS: none   ANESTHESIA:   spinal  EBL:  Total I/O In: -  Out: 650 [Urine:350; Blood:300]  BLOOD ADMINISTERED:none  DRAINS: none   LOCAL MEDICATIONS USED:  NONE  SPECIMEN:  No Specimen  DISPOSITION OF SPECIMEN:  N/A  COUNTS:  YES  TOURNIQUET:  * No tourniquets in log *  DICTATION: .Dragon Dictation  PLAN OF CARE: Admit to inpatient   PATIENT DISPOSITION:  PACU - hemodynamically stable.   Delay start of Pharmacological VTE agent (>24hrs) due to surgical blood loss or risk of bleeding: yes

## 2016-06-18 NOTE — Transfer of Care (Signed)
Immediate Anesthesia Transfer of Care Note  Patient: Theresa Vasquez  Procedure(s) Performed: Procedure(s): INTRAMEDULLARY (IM) NAIL INTERTROCHANTRIC (Left)  Patient Location: PACU  Anesthesia Type:General  Level of Consciousness: sedated  Airway & Oxygen Therapy: Patient connected to face mask oxygen  Post-op Assessment: Post -op Vital signs reviewed and stable  Post vital signs: stable  Last Vitals:  Vitals:   06/18/16 0800 06/18/16 2030  BP: (!) 129/45 (!) 158/62  Pulse: 72 79  Resp: 14 (!) 23  Temp: 37.1 C 36.9 C    Last Pain:  Vitals:   06/18/16 2030  TempSrc: Temporal  PainSc:          Complications: No apparent anesthesia complications

## 2016-06-18 NOTE — Progress Notes (Signed)
Nutrition Brief Note  Patient identified via Consult - for hip fx protocol  Wt Readings from Last 15 Encounters:  06/18/16 201 lb (91.2 kg)  06/01/16 191 lb (86.6 kg)  05/18/16 189 lb (85.7 kg)  11/26/15 194 lb (88 kg)  09/26/15 199 lb (90.3 kg)  08/29/15 198 lb (89.8 kg)  08/15/15 195 lb (88.5 kg)  07/25/15 188 lb (85.3 kg)  07/18/15 206 lb (93.4 kg)  06/18/15 195 lb 1.7 oz (88.5 kg)  05/28/15 189 lb (85.7 kg)  05/14/15 195 lb 4.8 oz (88.6 kg)  10/01/14 189 lb 2.5 oz (85.8 kg)    Body mass index is 32.44 kg/m. Patient meets criteria for obese class I based on current BMI.   Reported PO intake PTA is good, wait stable, mild wasting noted at orbitals, none otherwise. Current diet order is NPO, awaiting surgery. Labs and medications reviewed   No nutrition interventions warranted at this time. If nutrition issues arise, please consult RD.   Satira Anis. Braylynn Lewing, MS, RD LDN Inpatient Clinical Dietitian Pager [phone]

## 2016-06-18 NOTE — Progress Notes (Signed)
[facility] [place] NAME: Bernadetta Zeeman    MR#:  [id number]  DATE OF BIRTH:  December 20, 1930  SUBJECTIVE:  CHIEF COMPLAINT:   Chief Complaint  Patient presents with  . Hip Pain  . Fall   No complaint. She got pain medication due to left hip pain. REVIEW OF SYSTEMS:  Review of Systems  Constitutional: Negative for chills, fever and malaise/fatigue.  HENT: Negative for congestion.   Eyes: Negative for blurred vision and double vision.  Respiratory: Negative for cough, shortness of breath and stridor.   Cardiovascular: Negative for chest pain and leg swelling.  Gastrointestinal: Negative for abdominal pain, blood in stool, diarrhea, melena, nausea and vomiting.  Genitourinary: Negative for dysuria.  Musculoskeletal: Negative for back pain and joint pain.  Neurological: Negative for dizziness, focal weakness and loss of consciousness.  Psychiatric/Behavioral: Negative for depression. The patient is not nervous/anxious.     DRUG ALLERGIES:  No Known Allergies VITALS:  Blood pressure (!) 129/45, pulse 72, temperature 98.7 F (37.1 C), temperature source Oral, resp. rate 14, height 5\' 6"  (1.676 m), weight 201 lb (91.2 kg), SpO2 97 %. PHYSICAL EXAMINATION:  Physical Exam  Constitutional: She is oriented to person, place, and time and well-developed, well-nourished, and in no distress.  HENT:  Head: Normocephalic.  Mouth/Throat: Oropharynx is clear and moist.  Eyes: Conjunctivae and EOM are normal.  Neck: Normal range of motion. Neck supple. No JVD present. No tracheal deviation present.  Cardiovascular: Normal rate, regular rhythm and normal heart sounds.  Exam reveals no gallop.   No murmur heard. Pulmonary/Chest: Effort normal and breath sounds normal. No respiratory distress. She has no wheezes. She has no rales.  Abdominal: Soft. She exhibits no distension. There is no tenderness.  Musculoskeletal: She exhibits no edema or  tenderness.  Unable to move left lower extremity, on fixation.  Neurological: She is alert and oriented to person, place, and time. No cranial nerve deficit.  Skin: No rash noted. No erythema.  Psychiatric: Affect and judgment normal.   LABORATORY PANEL:   CBC  Recent Labs Lab 06/18/16 0405  WBC 9.8  HGB 9.4*  HCT 29.2*  PLT 344   ------------------------------------------------------------------------------------------------------------------ Chemistries   Recent Labs Lab 06/18/16 0405  NA 141  K 4.0  CL 102  CO2 32  GLUCOSE 230*  BUN 17  CREATININE 0.94  CALCIUM 8.0*   RADIOLOGY:  Dg Chest 1 View  Result Date: 06/17/2016 CLINICAL DATA:  Recent fall with hip pain, initial encounter EXAM: CHEST 1 VIEW COMPARISON:  05/18/2016 FINDINGS: Cardiac shadow is mildly enlarged. Aortic calcifications are again seen. Lungs are well aerated bilaterally without focal infiltrate or sizable effusion. No acute bony abnormality is seen. Degenerative changes of the thoracic spine are noted. IMPRESSION: No acute abnormality noted. Electronically Signed   By: Inez Catalina M.D.   On: 06/17/2016 20:52   Dg Hip Unilat W Or Wo Pelvis 2-3 Views Left  Result Date: 06/17/2016 CLINICAL DATA:  Recent fall with left hip pain, initial encounter EXAM: DG HIP (WITH OR WITHOUT PELVIS) 2-3V LEFT COMPARISON:  None. FINDINGS: There is a comminuted intratrochanteric fracture with some impaction and angulation at the fracture site. Pelvic ring is intact. No gross soft tissue abnormality is seen. IMPRESSION: Left intratrochanteric femoral fracture Electronically Signed   By: Inez Catalina M.D.   On: 06/17/2016 20:53   ASSESSMENT AND PLAN:   Closed left hip fracture Hip surgery today per Dr. Marry Guan, pain  control, DVT prophylaxis and PT after surgery.  Type 2 DM with CKD stage 2 and hypertension (HCC) - sliding scale insulin with corresponding glucose checks   COPD, severe ([place]) - continue home inhalers,  stable.   Anxiety disorder - home dose anxiolytics   Chronic diastolic CHF (congestive heart failure) (HCC) - none in exacerbation, continue home meds   Paroxysmal atrial fibrillation ([place]) - continue home meds   Adult hypothyroidism - home dose thyroid replacement  Anemia of chronic disease. Follow-up hemoglobin after surgery.  I discussed with Dr. Marry Guan. All the records are reviewed and case discussed with Care Management/Social Worker. Management plans discussed with the patient, her son and they are in agreement.  CODE STATUS: Full code  TOTAL TIME TAKING CARE OF THIS PATIENT: 37 minutes.   More than 50% of the time was spent in counseling/coordination of care: YES  POSSIBLE D/C IN 3 DAYS, DEPENDING ON CLINICAL CONDITION.   Demetrios Loll M.D on 06/18/2016 at 2:56 PM  Between 7am to 6pm - Pager - [phone]  After 6pm go to [email or web address] - Proofreader  [facility]  Office  [phone]  CC: Primary care physician; Park Liter, DO  Note: This dictation was prepared with Dragon dictation along with smaller phrase technology. Any transcriptional errors that result from this process are unintentional.

## 2016-06-18 NOTE — Op Note (Addendum)
OPERATIVE NOTE  DATE OF SURGERY:  06/18/2016  PATIENT NAME:  Theresa Vasquez   DOB: [birth year]  MRN: [id number]  PRE-OPERATIVE DIAGNOSIS: Left intertrochanteric femur fracture  POST-OPERATIVE DIAGNOSIS:  Same  PROCEDURE: Open reduction and internal fixation of a left intertrochanteric femur fracture   SURGEON:  Marciano Sequin. M.D.  ANESTHESIA: general  ESTIMATED BLOOD LOSS: 300 mL  FLUIDS REPLACED: 700 mL of crystalloid  DRAINS: None  IMPLANTS UTILIZED: Synthes 11 mm/130 trochanteric fixation nail, 123XX123 mm helical blade, 40 mm x 5.0 mm locking screw  INDICATIONS FOR SURGERY: Theresa Vasquez is a 82 y.o. year old female who fell and sustained a displaced left intertrochanteric femur fracture. After discussion of the risks and benefits of surgical intervention, the patient expressed understanding of the risks benefits and agree with plans for open reduction and internal fixation.   The risks, benefits, and alternatives were discussed at length including but not limited to the risks of infection, bleeding, nerve injury, stiffness, blood clots, the need for revision surgery, limb length inequality, cardiopulmonary complications, among others, and they were willing to proceed.  PROCEDURE IN DETAIL: The patient was brought into the operating room and, after adequate general anesthesia was achieved, patient was placed on the fracture table. All bony prominences were well-padded. The left lower extremity was placed in traction and a provisional reduction was performed and verified using the C-arm. The patient's left hip and leg were cleaned and prepped with alcohol and DuraPrep and draped in the usual sterile fashion. A "timeout" was performed as per usual protocol. A lateral incision was made extended from the proximal portion of the greater trochanter proximally. The fascia was incised in line with the skin incision and the fibers of the hip abductors were split in line. The tip of the  greater trochanter was palpated and a distally threaded guide pin was inserted into the tip of the greater trochanter and advanced into the medullary canal. Position was confirmed in both AP and lateral planes using the C-arm. A pilot hole was enlarged using a step drill. A Synthes 11 mm/130 trochanteric fixation nail was advanced over the guidepin and position confirmed using the C-arm. A second stab incision was made and the tissue protector was inserted through the outrigger device and advanced to the lateral cortex of the femur. A threaded screw guide pin was inserted into the femoral neck and head and position was again confirmed in both AP and lateral planes. Vision was were obtained and it was felt that a 123XX123 mm helical blade was appropriate. The cortex was reamed and then a cannulated reamer was advanced over the guidepin to the appropriate depth. A 123XX123 mm helical blade was then advanced over the guidepin and impacted into place. Good position was noted in multiple planes using the C-arm. The locking sleeve was engaged. Finally, the tissue protector was inserted through the outrigger device and advanced the lateral cortex of the femur for placement of the distal locking screw. A 40 mm x 5.0 mm locking screw was then inserted. The outrigger device was removed. The hip was visualized in all planes using the C-arm with good reduction appreciated and good position of the hardware noted.  The wound was irrigated with copious amounts of normal saline with antibiotic solution and suctioned dry. Good hemostasis was appreciated. The fascia was reapproximated using interrupted sutures of #1 Vicryl. Subcutaneous tissue was approximated layers using first #0 Vicryl followed #2-0 Vicryl. The skin was closed with skin staples.  A sterile dressing was applied.  The patient tolerated the procedure well and was transported to the recovery room in stable condition.   Marciano Sequin., M.D.

## 2016-06-18 NOTE — ED Notes (Signed)
Pt given water. MD told family that it was ok for her patient to have food and drink up until 5am.

## 2016-06-18 NOTE — Clinical Social Work Placement (Signed)
   CLINICAL SOCIAL WORK PLACEMENT  NOTE  Date:  06/18/2016  Patient Details  Name: Theresa Vasquez MRN: [id number] Date of Birth: 01/07/1931  Clinical Social Work is seeking post-discharge placement for this patient at the [place] level of care (*CSW will initial, date and re-position this form in  chart as items are completed):  Yes   Patient/family provided with [place] Work Department's list of facilities offering this level of care within the geographic area requested by the patient (or if unable, by the patient's family).  Yes   Patient/family informed of their freedom to choose among providers that offer the needed level of care, that participate in Medicare, Medicaid or managed care program needed by the patient, have an available bed and are willing to accept the patient.  Yes   Patient/family informed of 's ownership interest in [facility] and [facility], as well as of the fact that they are under no obligation to receive care at these facilities.  PASRR submitted to EDS on       PASRR number received on       Existing PASRR number confirmed on 06/18/16     FL2 transmitted to all facilities in geographic area requested by pt/family on 06/18/16     FL2 transmitted to all facilities within larger geographic area on       Patient informed that his/her managed care company has contracts with or will negotiate with certain facilities, including the following:            Patient/family informed of bed offers received.  Patient chooses bed at       Physician recommends and patient chooses bed at      Patient to be transferred to   on  .  Patient to be transferred to facility by       Patient family notified on   of transfer.  Name of family member notified:        PHYSICIAN       Additional Comment:    _______________________________________________ [name], Veronia Beets, LCSW 06/18/2016, 2:38 PM

## 2016-06-18 NOTE — NC FL2 (Signed)
[place] LEVEL OF CARE SCREENING TOOL     IDENTIFICATION  Patient Name: Theresa Vasquez Birthdate: 09/13/1930 Sex: female Admission Date (Current Location): 06/17/2016  [place] and [place] Number:  Engineering geologist and Address:  [facility], [address] [place], [place] [address]      Provider Number: [id number]  Attending Physician Name and Address:  Demetrios Loll, MD  Relative Name and Phone Number:       Current Level of Care: Hospital Recommended Level of Care: [place] Prior Approval Number:    Date Approved/Denied:   PASRR Number:  ( [id number] )  Discharge Plan: SNF    Current Diagnoses: Patient Active Problem List   Diagnosis Date Noted  . Closed left hip fracture ([place]) 06/17/2016  . Acute respiratory failure with hypoxia ([place]) 05/18/2016  . Benign neoplasm 12/27/2015  . PNA (pneumonia) 07/18/2015  . Acute diastolic CHF (congestive heart failure) ([place])   . Paroxysmal atrial fibrillation ([place]) 05/28/2015  . Acute GI bleeding   . Type 2 DM with CKD stage 2 and hypertension ([place]) 05/14/2015  . COPD, severe ([place]) 05/14/2015  . History of pneumonia 05/14/2015  . Benign hypertensive renal disease 05/14/2015  . Anxiety disorder 05/14/2015  . Diabetic polyneuropathy ([place]) 05/14/2015  . Diverticulosis 05/14/2015  . Abnormality of gait 05/14/2015  . Barrett's esophagus 05/14/2015  . Drug addiction in remission ([place]) 05/14/2015  . Mitral regurgitation 05/14/2015  . LBBB (left bundle branch block) 05/14/2015  . Carotid bruit 05/14/2015  . Ischemic colitis ([place]) 05/14/2015  . History of peptic ulcer disease 05/14/2015  . Chronic diastolic CHF (congestive heart failure) ([place]) 05/14/2015  . Atherosclerosis of aorta ([place]) 05/14/2015  . Involutional osteoporosis 05/14/2015  . Diverticulitis 05/14/2015  . Essential thrombocythemia ([place]) 05/14/2015  . H/O recurrent pneumonia 05/14/2015  . Heart & renal  disease, hypertensive, with heart failure ([place]) 05/14/2015  . Adult hypothyroidism 05/14/2015  . Hypoxemia 05/14/2015  . Non-rheumatic mitral regurgitation 05/14/2015  . Other specified symptoms and signs involving the circulatory and respiratory systems 05/14/2015  . Psychoactive substance dependence ([place]) 05/14/2015  . Peripheral vascular disease ([place]) 05/14/2015  . Vascular dementia with behavioral disturbance 05/14/2015  . Vascular disorder of intestine ([place]) 05/14/2015  . Iron deficiency anemia 11/24/2014  . Chronic systolic heart failure ([place]) 06/05/2014  . Lung edema 06/05/2014  . Alveolar hypoventilation 06/05/2014  . Arthritis, degenerative 02/20/2014    Orientation RESPIRATION BLADDER Height & Weight     Self, Time, Situation, Place  O2 (2 Liters Oxygen ) Incontinent, Indwelling catheter Weight: 201 lb (91.2 kg) Height:  5\' 6"  (167.6 cm)  BEHAVIORAL SYMPTOMS/MOOD NEUROLOGICAL BOWEL NUTRITION STATUS   (none)  (none) Continent Diet (NPO for surgery )  AMBULATORY STATUS COMMUNICATION OF NEEDS Skin   Extensive Assist Verbally Surgical wounds                       Personal Care Assistance Level of Assistance  Bathing, Feeding, Dressing Bathing Assistance: Limited assistance Feeding assistance: Independent Dressing Assistance: Limited assistance     Functional Limitations Info  Sight, Hearing, Speech Sight Info: Impaired Hearing Info: Adequate Speech Info: Adequate    SPECIAL CARE FACTORS FREQUENCY  PT (By licensed PT), OT (By licensed OT)     PT Frequency:  (5) OT Frequency:  (5)            Contractures      Additional Factors Info  Code Status, Allergies,  Insulin Sliding Scale Code Status Info:  (Full Code. ) Allergies Info:  (No Known Allergies. )   Insulin Sliding Scale Info:  (NovoLog Insulin Injections. )       Current Medications (06/18/2016):  This is the current hospital active medication list Current Facility-Administered Medications   Medication Dose Route Frequency Provider Last Rate Last Dose  . 0.9 %  sodium chloride infusion   Intravenous Continuous Dereck Leep, MD 100 mL/hr at 06/18/16 0902    . acetaminophen (TYLENOL) tablet 650 mg  650 mg Oral Q6H PRN Lance Coon, MD       Or  . acetaminophen (TYLENOL) suppository 650 mg  650 mg Rectal Q6H PRN Lance Coon, MD      . amiodarone (PACERONE) tablet 100 mg  100 mg Oral Daily Lance Coon, MD      . amLODipine (NORVASC) tablet 5 mg  5 mg Oral BID Lance Coon, MD      . aspirin EC tablet 81 mg  81 mg Oral Daily Lance Coon, MD      . ceFAZolin (ANCEF) IVPB 2g/100 mL premix  2 g Intravenous To OR Dereck Leep, MD      . divalproex (DEPAKOTE) DR tablet 250 mg  250 mg Oral TID Lance Coon, MD   250 mg at 06/18/16 0854  . donepezil (ARICEPT) tablet 10 mg  10 mg Oral QHS Lance Coon, MD      . furosemide (LASIX) tablet 40 mg  40 mg Oral BID Lance Coon, MD      . insulin aspart (novoLOG) injection 0-9 Units  0-9 Units Subcutaneous Q6H Lance Coon, MD   2 Units at 06/18/16 [phone]  . levothyroxine (SYNTHROID, LEVOTHROID) tablet 150 mcg  150 mcg Oral QAC breakfast Lance Coon, MD      . lisinopril (PRINIVIL,ZESTRIL) tablet 20 mg  20 mg Oral BID Lance Coon, MD      . mometasone-formoterol [facility]) 200-5 MCG/ACT inhaler 2 puff  2 puff Inhalation BID Lance Coon, MD   2 puff at 06/18/16 [phone]  . morphine 2 MG/ML injection 2 mg  2 mg Intravenous Q4H PRN Lance Coon, MD      . ondansetron [facility]) tablet 4 mg  4 mg Oral Q6H PRN Lance Coon, MD       Or  . ondansetron [facility]) injection 4 mg  4 mg Intravenous Q6H PRN Lance Coon, MD      . oxyCODONE (Oxy IR/ROXICODONE) immediate release tablet 5 mg  5 mg Oral Q4H PRN Lance Coon, MD   5 mg at 06/18/16 1348  . oxyCODONE (Oxy IR/ROXICODONE) immediate release tablet 5-10 mg  5-10 mg Oral Q4H PRN Dereck Leep, MD      . pantoprazole (PROTONIX) EC tablet 40 mg  40 mg Oral Daily Lance Coon, MD      . tiotropium  [facility]) inhalation capsule 18 mcg  18 mcg Inhalation Daily Lance Coon, MD   18 mcg at 06/18/16 0855  . venlafaxine XR (EFFEXOR-XR) 24 hr capsule 37.5 mg  37.5 mg Oral Q breakfast Lance Coon, MD         Discharge Medications: Please see discharge summary for a list of discharge medications.  Relevant Imaging Results:  Relevant Lab Results:   Additional Information  (SSN: [id number])  Sample, Veronia Beets, LCSW

## 2016-06-18 NOTE — Clinical Social Work Note (Signed)
Clinical Social Work Assessment  Patient Details  Name: Theresa Vasquez MRN: [id number] Date of Birth: [birth year]  Date of referral:  06/18/16               Reason for consult:  Facility Placement                Permission sought to share information with:  Chartered certified accountant granted to share information::  Yes, Verbal Permission Granted  Name::      [place]::   [place]   Relationship::     Contact Information:     Housing/Transportation Living arrangements for the past 2 months:  [place] of Information:  Patient, Adult Children Patient Interpreter Needed:  None Criminal Activity/Legal Involvement Pertinent to Current Situation/Hospitalization:  No - Comment as needed Significant Relationships:  Adult Children Lives with:  Self, Other (Comment) (24/7 caregiver ) Do you feel safe going back to the place where you live?  Yes Need for family participation in patient care:  Yes (Comment)  Care giving concerns:  Patient lives in [place] and has a 24/7 caregiver.    Social Worker assessment / plan:  Holiday representative (CSW) received SNF consult. Per chart patient will have surgery today. CSW met with patient and her son Nicole Kindred was at bedside. CSW introduced self and explained role of CSW department. Patient was alert and oriented to self, time and place but not situation. Per son patient has dementia, lives in [place] and has a caregiver with her 24/7. Per son him and his sister Lovey Newcomer are the only children. CSW explained that PT will work with patient after surgery and make a recommendation of home health or SNF. Son prefers for patient to go to SN[facility] at [facility] or [facility]. CSW also explained that patient's insurance Blue Medicare will have to approve SNF stay. Patient and son verbalized their understanding.   FL2 complete and faxed out. CSW will continue to follow and assist as needed.   Employment  status:  Retired Nurse, adult PT Recommendations:  Not assessed at this time Information / Referral to community resources:  [place]  Patient/Family's Response to care:  Patient and son are agreeable to SNF search in [place].   Patient/Family's Understanding of and Emotional Response to Diagnosis, Current Treatment, and Prognosis:  Patient and son were very pleasant and thanked CSW for assistance.   Emotional Assessment Appearance:  Appears stated age Attitude/Demeanor/Rapport:    Affect (typically observed):  Accepting, Adaptable, Pleasant Orientation:  Oriented to Self, Oriented to Place, Fluctuating Orientation (Suspected and/or reported [place]), Oriented to  Time Alcohol / Substance use:  Not Applicable Psych involvement (Current and /or in the community):  No (Comment)  Discharge Needs  Concerns to be addressed:  Discharge Planning Concerns Readmission within the last 30 days:  No Current discharge risk:  Dependent with Mobility Barriers to Discharge:  Continued Medical Work up   [employer], Veronia Beets, LCSW 06/18/2016, 2:39 PM

## 2016-06-18 NOTE — Anesthesia Procedure Notes (Signed)
Procedure Name: Intubation Date/Time: 06/18/2016 5:49 PM Performed by: Aline Brochure Pre-anesthesia Checklist: Patient identified, Emergency Drugs available, Suction available and Patient being monitored Patient Re-evaluated:Patient Re-evaluated prior to inductionOxygen Delivery Method: Circle system utilized Preoxygenation: Pre-oxygenation with 100% oxygen Intubation Type: IV induction Ventilation: Mask ventilation without difficulty Laryngoscope Size: Mac and 3 Grade View: Grade I Tube type: Oral Tube size: 7.0 mm Number of attempts: 1 Airway Equipment and Method: Stylet Placement Confirmation: ETT inserted through vocal cords under direct vision,  positive ETCO2 and breath sounds checked- equal and bilateral Secured at: 21 cm Tube secured with: Tape Dental Injury: Teeth and Oropharynx as per pre-operative assessment

## 2016-06-18 NOTE — Progress Notes (Signed)
Inpatient Diabetes Program Recommendations  AACE/ADA: New Consensus Statement on Inpatient Glycemic Control (2015)  Target Ranges:  Prepandial:   less than 140 mg/dL      [facility] postprandial:   less than 180 mg/dL (1-2 hours)      Critically ill patients:  140 - 180 mg/dL   Lab Results  Component Value Date   GLUCAP 171 (H) 06/18/2016   HGBA1C 6.3 06/01/2016    Review of Glycemic Control  Diabetes history: DM2, BUN and Creatinine WNL Outpatient Diabetes medications: Metformin 500 mg BID Current orders for Inpatient glycemic control: Novolog 0-9 units Q6H sensitive correction, NPO  Inpatient Diabetes Program Recommendations: If patient is to receive steroids during procedure, please consider Novolog 0-15 units Q4H moderate correction when NPO and then moderate correction of Novolog 0-15 units TIDAC, 0-5 units QHS when eating.  Thank you,  Windy Carina, RN, BSN Diabetes Coordinator Inpatient Diabetes Program [phone] (Team Pager)

## 2016-06-18 NOTE — Progress Notes (Signed)
Patient taken to OR via room bed. Foley and NSL in place. IV ABX and chart sent with patient. Family at bedside and followed patient to Pre-op.

## 2016-06-18 NOTE — Anesthesia Preprocedure Evaluation (Signed)
Anesthesia Evaluation  Patient identified by MRN, date of birth, ID band Patient awake    Reviewed: Allergy & Precautions, H&P , NPO status , Patient's Chart, lab work & pertinent test results, reviewed documented beta blocker date and time   History of Anesthesia Complications Negative for: history of anesthetic complications  Airway Mallampati: III  TM Distance: >3 FB Neck ROM: full    Dental  (+) Partial Upper, Partial Lower, Missing, Teeth Intact   Pulmonary shortness of breath, with exertion and Long-Term Oxygen Therapy, neg sleep apnea, COPD,  oxygen dependent, neg recent URI, former smoker,    breath sounds clear to auscultation + decreased breath sounds      Cardiovascular Exercise Tolerance: Good hypertension, (-) angina+ Peripheral Vascular Disease, +CHF and + DOE  (-) CAD, (-) Past MI, (-) Cardiac Stents and (-) CABG + dysrhythmias Atrial Fibrillation + Valvular Problems/Murmurs      Neuro/Psych neg Seizures PSYCHIATRIC DISORDERS (Depression, anxiety, and dementia) TIA Neuromuscular disease    GI/Hepatic negative GI ROS, Neg liver ROS,   Endo/Other  diabetesHypothyroidism   Renal/GU CRFRenal disease  negative genitourinary   Musculoskeletal   Abdominal   Peds  Hematology  (+) Blood dyscrasia, anemia ,   Anesthesia Other Findings Past Medical History: No date: Adenomatous polyps No date: Age-related osteoporosis without current patho* No date: Anemia No date: Anxiety No date: Atherosclerosis of aorta (HCC) No date: Atrial fibrillation with RVR (HCC) No date: Barrett's esophagus without dysplasia No date: CHF (congestive heart failure) (HCC) No date: Colitis No date: COPD (chronic obstructive pulmonary disease) (* No date: Dementia No date: Depression No date: Diabetes mellitus type II, uncontrolled ([place]) No date: Diverticulitis No date: Diverticulosis of intestine without perforatio* No date:  Edema No date: Essential hemorrhagic thrombocythemia (HCC) No date: Gastrointestinal hemorrhage No date: High anion gap metabolic acidosis No date: Hyperlipidemia No date: Hypertension No date: Hypertensive chronic kidney disease with stage* No date: Hypothyroidism No date: Hypoxemia No date: Iron deficiency anemia No date: Lactic acidosis No date: Left bundle branch block No date: Nonrheumatic mitral valve insufficiency No date: Osteoarthritis No date: Other psychoactive substance dependence, in re* No date: Other specified symptoms and signs involving t* No date: Paroxysmal atrial fibrillation (HCC)     Comment: s/p cardioversion No date: Personal history of peptic ulcer disease No date: Pulmonary edema 05/14/2015: PVD (peripheral vascular disease) ([place]) No date: Recurrent pneumonia     Comment: history of No date: Respiratory failure with hypoxia and hypercapn* No date: Type 2 diabetes mellitus with diabetic chronic* No date: Type 2 diabetes mellitus with diabetic polyneu* No date: Vascular dementia without behavioral disturban* No date: Vascular disorder of intestine (HCC)   Reproductive/Obstetrics negative OB ROS                             Anesthesia Physical Anesthesia Plan  ASA: III  Anesthesia Plan: General   Post-op Pain Management:    Induction:   Airway Management Planned:   Additional Equipment:   Intra-op Plan:   Post-operative Plan:   Informed Consent: I have reviewed the patients History and Physical, chart, labs and discussed the procedure including the risks, benefits and alternatives for the proposed anesthesia with the patient or authorized representative who has indicated his/her understanding and acceptance.   Dental Advisory Given  Plan Discussed with: Anesthesiologist, CRNA and Surgeon  Anesthesia Plan Comments:         Anesthesia Quick Evaluation

## 2016-06-19 ENCOUNTER — Encounter: Payer: Self-pay | Admitting: Orthopedic Surgery

## 2016-06-19 HISTORY — PX: HIP SURGERY: SHX245

## 2016-06-19 LAB — CBC
HCT: 24.2 % — ABNORMAL LOW (ref 35.0–47.0)
Hemoglobin: 7.8 g/dL — ABNORMAL LOW (ref 12.0–16.0)
MCH: 28.9 pg (ref 26.0–34.0)
MCHC: 32.2 g/dL (ref 32.0–36.0)
MCV: 89.6 fL (ref 80.0–100.0)
PLATELETS: 307 10*3/uL (ref 150–440)
RBC: 2.7 MIL/uL — AB (ref 3.80–5.20)
RDW: 14.8 % — ABNORMAL HIGH (ref 11.5–14.5)
WBC: 10 10*3/uL (ref 3.6–11.0)

## 2016-06-19 LAB — GLUCOSE, CAPILLARY
GLUCOSE-CAPILLARY: 161 mg/dL — AB (ref 65–99)
GLUCOSE-CAPILLARY: 166 mg/dL — AB (ref 65–99)
GLUCOSE-CAPILLARY: 210 mg/dL — AB (ref 65–99)
GLUCOSE-CAPILLARY: 256 mg/dL — AB (ref 65–99)
Glucose-Capillary: 178 mg/dL — ABNORMAL HIGH (ref 65–99)
Glucose-Capillary: 180 mg/dL — ABNORMAL HIGH (ref 65–99)

## 2016-06-19 LAB — BASIC METABOLIC PANEL
ANION GAP: 7 (ref 5–15)
BUN: 15 mg/dL (ref 6–20)
CO2: 30 mmol/L (ref 22–32)
Calcium: 7.7 mg/dL — ABNORMAL LOW (ref 8.9–10.3)
Chloride: 106 mmol/L (ref 101–111)
Creatinine, Ser: 0.85 mg/dL (ref 0.44–1.00)
GFR calc Af Amer: >60 mL/min (ref >60)
Glucose, Bld: 167 mg/dL — ABNORMAL HIGH (ref 65–99)
POTASSIUM: 4.5 mmol/L (ref 3.5–5.1)
Sodium: 143 mmol/L (ref 135–145)

## 2016-06-19 MED ORDER — INSULIN ASPART 100 UNIT/ML ~~LOC~~ SOLN
0.0000 [IU] | Freq: Three times a day (TID) | SUBCUTANEOUS | Status: DC
  Administered 2016-06-19: 2 [IU] via SUBCUTANEOUS
  Administered 2016-06-20: 1 [IU] via SUBCUTANEOUS
  Administered 2016-06-20 – 2016-06-21 (×3): 2 [IU] via SUBCUTANEOUS
  Administered 2016-06-21: 3 [IU] via SUBCUTANEOUS
  Administered 2016-06-22: 1 [IU] via SUBCUTANEOUS
  Administered 2016-06-22 (×2): 3 [IU] via SUBCUTANEOUS
  Administered 2016-06-23: 1 [IU] via SUBCUTANEOUS
  Filled 2016-06-19: qty 3
  Filled 2016-06-19: qty 2
  Filled 2016-06-19: qty 3
  Filled 2016-06-19 (×3): qty 2
  Filled 2016-06-19 (×2): qty 1
  Filled 2016-06-19: qty 2
  Filled 2016-06-19: qty 3

## 2016-06-19 MED ORDER — INSULIN ASPART 100 UNIT/ML ~~LOC~~ SOLN
0.0000 [IU] | Freq: Every day | SUBCUTANEOUS | Status: DC
  Administered 2016-06-21 – 2016-06-22 (×2): 2 [IU] via SUBCUTANEOUS
  Filled 2016-06-19 (×3): qty 2

## 2016-06-19 NOTE — Care Management (Signed)
RNCM consult received. POD 1 Closed left hip fracture. PT pending. It is anticipated that patient will go to SNF. Following with CSW.

## 2016-06-19 NOTE — Progress Notes (Signed)
PT Cancellation Note  Patient Details Name: Theresa Vasquez MRN: [id number] DOB: 1930-09-18   Cancelled Treatment:    Reason Eval/Treat Not Completed: Other (comment).  Pt's family requesting time to allow pt to eat her breakfast.  Will re-attempt PT eval later this morning as able.   [name] 06/19/2016, 9:02 AM [name], [place]

## 2016-06-19 NOTE — Progress Notes (Signed)
Alert to self and place. Dsg with small amt drainage. Medicated for pain with relief. Complained of pain whten assisted to side of bed. Adequate amt of  Urine output. No acute distress noted. Will continue to monitor pt

## 2016-06-19 NOTE — Progress Notes (Addendum)
PT is recommending SNF. Clinical Social Worker (CSW) met with patient and her son Nicole Kindred to present bed offers. Son chose [facility]. CSW started [employer] authorization. [facility] liaison is aware of accepted bed offer.   [facility] Medicare authorization has been received. Auth # [id number] RVB. Patient can D/C to [facility] room 808 over the weekend if stable. [facility] liaison is aware of above. [name] Nicole Kindred is aware of above.   [employer], LCSW [phone]

## 2016-06-19 NOTE — Progress Notes (Signed)
Physical Therapy Treatment Patient Details Name: Theresa Vasquez MRN: [id number] DOB: Aug 30, 1930 Today's Date: 06/19/2016    History of Present Illness Pt is an 82 y.o. female s/p fall with L hip pain and found to have displaced L intertrochanteric femur fx.  Pt s/p L IMN 06/18/16.  PMH includes dementia, a-fib with RVR, CHF, COPD, DM, htn.    PT Comments    Pt sleepy upon PT entering room (pt sitting in recliner with nurse present) but pt woke for therapy activities.  Pt initially very hesitant to participate d/t concerns of L hip pain but eventually pt was able to participate in transfer from chair back to bed and safely follow cueing.  Pt's O2 noted to be 84% on 4 L/min O2 end of session and focused on purse lip breathing to increase O2 sats to 90% on 4 L.  Nursing came to assess pt and used pulse-ox on pt's ear (instead of pt's finger) which showed 94% O2 on 4 L.   Follow Up Recommendations  SNF     Equipment Recommendations   (TBD at post acute setting)    Recommendations for Other Services       Precautions / Restrictions Precautions Precautions: Fall Restrictions Weight Bearing Restrictions: Yes LLE Weight Bearing: Weight bearing as tolerated    Mobility  Bed Mobility Overal bed mobility: Needs Assistance Bed Mobility: Sit to Supine     Sit to supine: Max assist;+2 for physical assistance  Logrolling to R and L (minimally to L d/t pain) to reposition in bed and adjust sheets max assist x1 plus 2nd assist to adjust sheets. General bed mobility comments: assist for trunk and B LE's (pt with difficulty assisting d/t significant c/o L hip pain with movement)  Transfers Overall transfer level: Needs assistance Equipment used: None Transfers: Squat Pivot Transfers   Squat pivot transfers: Max assist;+2 physical assistance     General transfer comment: extra time required to position pt for transfer d/t pt holding onto chair tightly and leaning backwards; pt  eventually appeared ready for transfer back to chair and following cueing appropriate for safe transfer; squat pivot to L recliner to bed with L armrest lowered  Ambulation/Gait             General Gait Details: Not appropriate at this time   Stairs            Wheelchair Mobility    Modified Rankin (Stroke Patients Only)       Balance Overall balance assessment: Needs assistance Sitting-balance support: Bilateral upper extremity supported;Feet supported Sitting balance-Leahy Scale: Fair Sitting balance - Comments: pt initially with significant posterior lean sitting edge of bed but improved to CGA with cueing for technique and repositioning Postural control: Posterior lean     Standing balance comment: Unable to stand pt to assess                    Cognition Arousal/Alertness: Lethargic Behavior During Therapy: WFL for tasks assessed/performed Overall Cognitive Status: History of cognitive impairments - at baseline (Oriented to person but not place/time/situation)       Memory: Decreased recall of precautions              Exercises     General Comments General comments (skin integrity, edema, etc.): [name] and caregiver present most of session but left for mobility.  Nursing cleared pt for participation in physical therapy.  Pt agreeable to getting back to bed.  Pertinent Vitals/Pain Pain Assessment: Faces Faces Pain Scale: Hurts whole lot Pain Location: L hip Pain Descriptors / Indicators: Discomfort;Operative site guarding;Tender;Aching Pain Intervention(s): Limited activity within patient's tolerance;Monitored during session;Premedicated before session;Repositioned;Ice applied    Home Living Family/patient expects to be discharged to:: Private residence Living Arrangements: Children (Has 24/7 caregiver assist) Available Help at Discharge: Personal care attendant;Available 24 hours/day Type of Home: House Home Access: Ramped  entrance   Home Layout: One level Home Equipment: Walker - 4 wheels;Cane - single point;Hand held shower head;Grab bars - tub/shower;Shower seat;Bedside commode (lift chair)      Prior Function Level of Independence: Needs assistance  Gait / Transfers Assistance Needed: Ambulatory limited household distances with rollator (occasionally requiring physical assist) ADL's / Homemaking Assistance Needed: 24 hour caregiver assist with ADLs, and IADLs. Comments: Uses 2 L home O2 at night.   PT Goals (current goals can now be found in the care plan section) Acute Rehab PT Goals Patient Stated Goal: to have less pain PT Goal Formulation: With patient Time For Goal Achievement: 07/03/16 Potential to Achieve Goals: Fair Progress towards PT goals: Progressing toward goals    Frequency    BID      PT Plan Current plan remains appropriate    Co-evaluation             End of Session Equipment Utilized During Treatment: Gait belt;Oxygen (4 L/min O2 via nasal cannula) Activity Tolerance: Patient limited by fatigue;Patient limited by pain Patient left: in bed;with call bell/phone within reach;with bed alarm set;with nursing/sitter in room;with family/visitor present;with SCD's reapplied (B heels elevated via pillow)     Time: 1410-1434 PT Time Calculation (min) (ACUTE ONLY): 24 min  Charges:   $Therapeutic Activity: 23-37 mins                    G CodesLeitha Bleak 07/13/2016, 3:03 PM [name], [place]

## 2016-06-19 NOTE — Progress Notes (Signed)
[facility] [place] NAME: Theresa Vasquez    MR#:  [id number]  DATE OF BIRTH:  01/15/1931  SUBJECTIVE:  CHIEF COMPLAINT:   Chief Complaint  Patient presents with  . Hip Pain  . Fall   No complaint. She drowsy after pain medication. REVIEW OF SYSTEMS:  Review of Systems  Constitutional: Negative for chills, fever and malaise/fatigue.  HENT: Negative for congestion.   Eyes: Negative for blurred vision and double vision.  Respiratory: Negative for cough, shortness of breath and stridor.   Cardiovascular: Negative for chest pain and leg swelling.  Gastrointestinal: Negative for abdominal pain, blood in stool, diarrhea, melena, nausea and vomiting.  Genitourinary: Negative for dysuria.  Musculoskeletal: Negative for back pain and joint pain.  Neurological: Negative for dizziness, focal weakness and loss of consciousness.  Psychiatric/Behavioral: Negative for depression. The patient is not nervous/anxious.     DRUG ALLERGIES:  No Known Allergies VITALS:  Blood pressure (!) 92/40, pulse 96, temperature (!) 100.8 F (38.2 C), temperature source Oral, resp. rate 18, height 5\' 6"  (1.676 m), weight 201 lb (91.2 kg), SpO2 (!) 83 %. PHYSICAL EXAMINATION:  Physical Exam  Constitutional: She is oriented to person, place, and time and well-developed, well-nourished, and in no distress.  HENT:  Head: Normocephalic.  Mouth/Throat: Oropharynx is clear and moist.  Eyes: Conjunctivae and EOM are normal.  Neck: Normal range of motion. Neck supple. No JVD present. No tracheal deviation present.  Cardiovascular: Normal rate, regular rhythm and normal heart sounds.  Exam reveals no gallop.   No murmur heard. Pulmonary/Chest: Effort normal and breath sounds normal. No respiratory distress. She has no wheezes. She has no rales.  Abdominal: Soft. She exhibits no distension. There is no tenderness.  Musculoskeletal: She exhibits no edema or tenderness.    Neurological: She is alert and oriented to person, place, and time. No cranial nerve deficit.  Skin: No rash noted. No erythema.  Psychiatric: Affect and judgment normal.   LABORATORY PANEL:   CBC  Recent Labs Lab 06/19/16 0548  WBC 10.0  HGB 7.8*  HCT 24.2*  PLT 307   ------------------------------------------------------------------------------------------------------------------ Chemistries   Recent Labs Lab 06/19/16 0548  NA 143  K 4.5  CL 106  CO2 30  GLUCOSE 167*  BUN 15  CREATININE 0.85  CALCIUM 7.7*   RADIOLOGY:  Dg Hip Operative Unilat W Or W/o Pelvis Left  Result Date: 06/18/2016 CLINICAL DATA:  Intra medullary rod placement left femur EXAM: OPERATIVE left HIP (WITH PELVIS IF PERFORMED) 9 VIEWS TECHNIQUE: Fluoroscopic spot image(s) were submitted for interpretation post-operatively. COMPARISON:  Pelvic radiograph 06/17/2016 FINDINGS: Fluoroscopic images were obtained during the placement of an antegrade intra medullary rod with interlocking screw in the left femur, traversing an intertrochanteric fracture. Fluoroscopy time was reported as 64 seconds. IMPRESSION: Intraoperative images obtained during left femoral internal fixation. Electronically Signed   By: Ulyses Jarred M.D.   On: 06/18/2016 22:13   ASSESSMENT AND PLAN:   Closed left hip fracture S/p ORIF POD1, pain control, DVT prophylaxis and PT.  Type 2 DM with CKD stage 2 and hypertension (HCC) - sliding scale insulin with corresponding glucose checks   COPD, severe ([place]) - continue home inhalers, stable.   Anxiety disorder - home dose anxiolytics   Chronic diastolic CHF (congestive heart failure) (HCC) - none in exacerbation, continue home meds   Paroxysmal atrial fibrillation ([place]) - continue home meds   Adult hypothyroidism - home dose thyroid replacement  Anemia, acute on chronic disease, due to acute blood lose.  Hb down to 7.8. Follow-up hemoglobin in am.  All the records are reviewed and case  discussed with Care Management/Social Worker. Management plans discussed with the patient, her caregiver and they are in agreement.  CODE STATUS: Full code  TOTAL TIME TAKING CARE OF THIS PATIENT: 33 minutes.   More than 50% of the time was spent in counseling/coordination of care: YES  POSSIBLE D/C IN 2 DAYS, DEPENDING ON CLINICAL CONDITION.   Demetrios Loll M.D on 06/19/2016 at 3:58 PM  Between 7am to 6pm - Pager - [phone]  After 6pm go to [email or web address] - Proofreader  [facility]  Office  [phone]  CC: Primary care physician; Park Liter, DO  Note: This dictation was prepared with Dragon dictation along with smaller phrase technology. Any transcriptional errors that result from this process are unintentional.

## 2016-06-19 NOTE — Progress Notes (Signed)
CHG bath was completed

## 2016-06-19 NOTE — Evaluation (Signed)
Physical Therapy Evaluation Patient Details Name: Theresa Vasquez MRN: [id number] DOB: 1930-12-31 Today's Date: 06/19/2016   History of Present Illness  Pt is an 82 y.o. female s/p fall with L hip pain and found to have displaced L intertrochanteric femur fx.  Pt s/p L IMN 06/18/16.  PMH includes dementia, a-fib with RVR, CHF, COPD, DM, htn.  Clinical Impression  Prior to hospital admission, pt was ambulating short distances in home with rollator (occasionally required some physical assist per pt's caregiver).  Pt lives in a 1 level home with ramp to enter and 24/7 caregiver support.  Currently pt is requiring significant 2 person assist with bed mobility and transfers; unsafe to attempt gait at this time d/t current assist levels with limited mobility.  Pt would benefit from skilled PT to address noted impairments and functional limitations.  Recommend pt discharge to STR when medically appropriate.    Follow Up Recommendations SNF    Equipment Recommendations   (TBD at post acute setting)    Recommendations for Other Services       Precautions / Restrictions Precautions Precautions: Fall Restrictions Weight Bearing Restrictions: Yes LLE Weight Bearing: Weight bearing as tolerated      Mobility  Bed Mobility Overal bed mobility: Needs Assistance Bed Mobility: Supine to Sit     Supine to sit: Max assist;+2 for physical assistance;HOB elevated     General bed mobility comments: assist for trunk and B LE's (pt with difficulty assisting d/t significant c/o L hip pain with movement)  Transfers Overall transfer level: Needs assistance Equipment used: Rolling walker (2 wheeled) Transfers: Sit to/from [employer] Sit to Stand: Max assist;Total assist;+2 physical assistance   Squat pivot transfers: Max assist;+2 physical assistance     General transfer comment: 2 trials attempting to stand with RW but pt unable to clear bottom from bed with 2 assist (limited  d/t c/o L hip pain with any attempts at [facility]); vc's for technique for transfers required; dropped L armrest on recliner and performed squat pivot bed to recliner to R with 2 assist  Ambulation/Gait             General Gait Details: Not appropriate at this time  Stairs            Wheelchair Mobility    Modified Rankin (Stroke Patients Only)       Balance Overall balance assessment: Needs assistance Sitting-balance support: Bilateral upper extremity supported;Feet supported Sitting balance-Leahy Scale: Fair Sitting balance - Comments: pt initially with significant posterior lean sitting (anticipate d/t L hip pain) requiring max assist to maintain sitting but improved to CGA to SBA once repositioned and pt became more comfortable in sitting position       Standing balance comment: Unable to stand pt to assess                             Pertinent Vitals/Pain Pain Assessment: Faces Faces Pain Scale: Hurts whole lot (with movement or WB'ing; minimal pain at rest) Pain Location: L hip Pain Descriptors / Indicators: Discomfort;Operative site guarding;Aching;Tender Pain Intervention(s): Limited activity within patient's tolerance;Monitored during session;Premedicated before session;Repositioned;Ice applied  Vitals (HR and O2) stable and WFL throughout treatment session on 3 L/min O2 via nasal cannula.    Home Living Family/patient expects to be discharged to:: Private residence Living Arrangements: Children (Has 24/7 caregiver assist) Available Help at Discharge: Personal care attendant;Available 24 hours/day Type of Home: [place]  Access: Ramped entrance     Home Layout: One level Home Equipment: [place] - 4 wheels;Cane - single point;Hand held shower head;Grab bars - tub/shower;Shower seat;Bedside commode (lift chair)      Prior Function Level of Independence: Needs assistance   Gait / Transfers Assistance Needed: Ambulatory limited household  distances with rollator (occasionally requiring physical assist)  ADL's / Homemaking Assistance Needed: 24 hour caregiver assist with ADLs, and IADLs.  Comments: Uses 2 L home O2 at night.     Hand Dominance   Dominant Hand: Right    Extremity/Trunk Assessment   Upper Extremity Assessment: Generalized weakness           Lower Extremity Assessment: RLE deficits/detail;LLE deficits/detail RLE Deficits / Details: generalized weakness LLE Deficits / Details: L hip flexion 2/5; L knee flexion/extension 2+/5; L DF at least 3+/5      Communication   Communication: HOH  Cognition Arousal/Alertness: Awake/alert Behavior During Therapy: WFL for tasks assessed/performed Overall Cognitive Status: History of cognitive impairments - at baseline (Oriented to person but not place, situation, or time)       Memory: Decreased recall of precautions              General Comments General comments (skin integrity, edema, etc.): Pt's caregiver present most of session but left for ex's and OOB mobility.  Nursing cleared pt for participation in physical therapy.  Pt and pt's caregiver agreeable to PT session.    Exercises Total Joint Exercises Ankle Circles/Pumps: AAROM;Strengthening;Both;10 reps;Supine Quad Sets: AAROM;Strengthening;Both;10 reps;Supine (tactile cues to perform) Towel Squeeze: AROM;Strengthening;Both;10 reps;Supine Short Arc Quad: AAROM;Strengthening;Left;10 reps;Supine Heel Slides: AAROM;Strengthening;Left;10 reps;Supine (limited range d/t pain) Hip ABduction/ADduction: AAROM;Strengthening;Left;10 reps;Supine (limited range d/t pain)  Vc's and tactile cues required for ex's.   Assessment/Plan    PT Assessment Patient needs continued PT services  PT Problem List Decreased strength;Decreased range of motion;Decreased activity tolerance;Decreased balance;Decreased mobility;Decreased knowledge of use of DME;Decreased knowledge of precautions;Pain          PT  Treatment Interventions DME instruction;Gait training;Functional mobility training;Therapeutic activities;Therapeutic exercise;Balance training;Patient/family education    PT Goals (Current goals can be found in the Care Plan section)  Acute Rehab PT Goals Patient Stated Goal: to be able to walk again PT Goal Formulation: With patient (with caregiver) Time For Goal Achievement: 07/03/16 Potential to Achieve Goals: Fair    Frequency BID   Barriers to discharge Decreased caregiver support      Co-evaluation               End of Session Equipment Utilized During Treatment: Gait belt;Oxygen (3 L O2 via nasal cannula) Activity Tolerance: Patient limited by fatigue;Patient limited by pain Patient left: in chair;with call bell/phone within reach;with chair alarm set;with family/visitor present;with SCD's reapplied (B heels elevated via pillow; ice pack in place on pt's L hip) Nurse Communication: Mobility status;Need for lift equipment;Precautions;Weight bearing status         Time: 1110-1200 PT Time Calculation (min) (ACUTE ONLY): 50 min   Charges:   PT Evaluation $PT Eval Low Complexity: 1 Procedure PT Treatments $Therapeutic Exercise: 8-22 mins $Therapeutic Activity: 8-22 mins   PT G CodesLeitha Bleak 27-Jun-2016, 1:42 PM [name], [place]

## 2016-06-19 NOTE — Evaluation (Signed)
Occupational Therapy Evaluation Patient Details Name: Theresa Vasquez MRN: [id number] DOB: 1930/08/16 Today's Date: 06/19/2016    History of Present Illness Pt. is an 82 y.o. female who was admitted to [facility] for ORIF (IM nailing) repair of a Left Intertrochanteric Femur Fracture. Pt. has a PMHx of COPD, CHF, Dementia, and DM.   Clinical Impression   Pt. Is an 82 y.o. Female who was admitted to [facility] for ORIF of a Left Hip Fracture. Pt. presents with weakness, decreased strength, pain, limited cognition, and decreased functional mobility which hinder her ability to complete ADL tasks. Pt. Could benefit from skilled OT services for ADL training, A/E training, functional mobility for ADLs, and pt./caregiver education about home modification/DME. Pt. Could benefit from follow-up OT services at the next venue of care.      Follow Up Recommendations  SNF    Equipment Recommendations       Recommendations for Other Services       Precautions / Restrictions Restrictions Weight Bearing Restrictions: Yes LLE Weight Bearing: Weight bearing as tolerated                                                     ADL Overall ADL's : Needs assistance/impaired Eating/Feeding: Set up   Grooming: Set up               Lower Body Dressing: Maximal assistance                 General ADL Comments: Pt. requires extensive assist for LE ADLs. Pt. and pesonal caregiver aide were provided with education about A/E use for LE ADLs.      Vision     Perception     Praxis      Pertinent Vitals/Pain Pain Assessment: 0-10 (Pain not rated.) Pain Intervention(s): Limited activity within patient's tolerance;Monitored during session     Hand Dominance Right   Extremity/Trunk Assessment Upper Extremity Assessment Upper Extremity Assessment: Overall WFL for tasks assessed           Communication Communication Communication: No difficulties   Cognition  Arousal/Alertness: Awake/alert Behavior During Therapy: WFL for tasks assessed/performed Overall Cognitive Status: Within Functional Limits for tasks assessed                     General Comments       Exercises       Shoulder Instructions      Home Living Family/patient expects to be discharged to:: Private residence Living Arrangements: Children ( Resided with son. 24 hour caregiver Assist) Available Help at Discharge: Available 24 hours/day;Personal care attendant Type of Home: House Home Access: Stairs to enter     Home Layout: One level     Bathroom Shower/Tub: Walk-in shower;Curtain         Home Equipment: Environmental consultant - 4 wheels;Cane - single point;Hand held shower head;Grab bars - tub/shower;Shower seat          Prior Functioning/Environment Level of Independence: Needs assistance    ADL's / Homemaking Assistance Needed: 24 hour caregiver assist with ADLs, and IADLs.            OT Problem List: Decreased strength;Decreased activity tolerance;Decreased knowledge of use of DME or AE;Pain;Decreased safety awareness   OT Treatment/Interventions: Self-care/ADL training;Therapeutic exercise;Therapeutic activities;Energy conservation;DME and/or AE instruction;Patient/family education  OT Goals(Current goals can be found in the care plan section) Acute Rehab OT Goals Patient Stated Goal: To get back to normal OT Goal Formulation: With patient Potential to Achieve Goals: Good  OT Frequency: Min 1X/week   Barriers to D/C:            Co-evaluation              End of Session    Activity Tolerance: Patient tolerated treatment well Patient left: in bed;with call bell/phone within reach;with bed alarm set;with nursing/sitter in room   Time: [id number] OT Time Calculation (min): 23 min Charges:  OT General Charges $OT Visit: 1 Procedure OT Evaluation $OT Eval Moderate Complexity: 1 Procedure G-Codes:    Harrel Carina, MS,  OTR/L 06/19/2016, 11:43 AM

## 2016-06-19 NOTE — Discharge Summary (Signed)
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 Adenomatous polyps   . Age-related osteoporosis without current pathological fracture   . Anemia   . Anxiety   . Atherosclerosis of aorta ([place])   . Atrial fibrillation with RVR ([place])   . Barrett's esophagus without dysplasia   . CHF (congestive heart failure) ([place])   . Colitis   . COPD (chronic obstructive pulmonary disease) ([place])   . Dementia   . Depression   . Diabetes mellitus type II, uncontrolled ([place])   . Diverticulitis   . Diverticulosis of intestine without perforation or abscess without bleeding   . Edema   . Essential hemorrhagic thrombocythemia ([place])   . Gastrointestinal hemorrhage   . High anion gap metabolic acidosis   . Hyperlipidemia   . Hypertension   . Hypertensive chronic kidney disease with stage 1 through stage 4 chronic kidney disease, or unspecified chronic kidney disease   . Hypothyroidism   . Hypoxemia   . Iron deficiency anemia   . Lactic acidosis   . Left bundle branch block   . Nonrheumatic mitral valve insufficiency   . Osteoarthritis   . Other psychoactive substance dependence, in remission ([place])   . Other specified symptoms and signs involving the circulatory and respiratory systems   . Paroxysmal atrial  fibrillation (HCC)    s/p cardioversion  . Personal history of peptic ulcer disease   . Pulmonary edema   . PVD (peripheral vascular disease) ([place]) 05/14/2015  . Recurrent pneumonia    history of  . Respiratory failure with hypoxia and hypercapnia ([place])   . Type 2 diabetes mellitus with diabetic chronic kidney disease ([place])   . Type 2 diabetes mellitus with diabetic polyneuropathy ([place])   . Vascular dementia without behavioral disturbance   . Vascular disorder of intestine [facility])    HOSPITAL COURSE:  Closed left hip fracture S/p ORIF POD1, pain control, DVT prophylaxis and PT.  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 Anxiety disorder - home dose anxiolytics Chronic diastolic CHF (congestive heart failure) (HCC) - none in exacerbation, continue home meds Paroxysmal atrial fibrillation ([place]) - continue home meds Adult hypothyroidism - home dose thyroid replacement Anemia, acute on chronic disease, due to acute blood lose.  Hb down to 7.8. Follow-up hemoglobin in am.  DISCHARGE CONDITIONS:  The patient will be discharged to skilled nursing facility in 2 days CONSULTS OBTAINED:  Treatment Team:  Dereck Leep, MD DRUG ALLERGIES:  No Known Allergies DISCHARGE MEDICATIONS:     Medication List    TAKE these medications   acetaminophen 500 MG tablet Commonly known as:  TYLENOL Take 500 mg by mouth every 4 (four) hours as  needed.   albuterol (2.5 MG/3ML) 0.083% nebulizer solution Commonly known as:  PROVENTIL Take 3 mLs (2.5 mg total) by nebulization every 4 (four) hours as needed for wheezing or shortness of breath.   amiodarone 200 MG tablet Commonly known as:  PACERONE Take 100 mg by mouth daily.   amLODipine 5 MG tablet Commonly known as:  NORVASC Take 1 tablet (5 mg total) by mouth 2 (two) times daily.   aspirin EC 81 MG tablet Take 81  mg by mouth daily.   divalproex 250 MG DR tablet Commonly known as:  DEPAKOTE Take 1 tablet (250 mg total) by mouth 3 (three) times daily.   donepezil 10 MG tablet Commonly known as:  ARICEPT Take 1 tablet (10 mg total) by mouth at bedtime.   ferrous sulfate 325 (65 FE) MG tablet Take 1 tablet (325 mg total) by mouth 2 (two) times daily with a meal. What changed:  when to take this   Fluticasone-Salmeterol 250-50 MCG/DOSE Aepb Commonly known as:  ADVAIR Inhale 1 puff into the lungs 2 (two) times daily.   furosemide 40 MG tablet Commonly known as:  LASIX Take 1 tablet (40 mg total) by mouth 2 (two) times daily.   gabapentin 400 MG capsule Commonly known as:  NEURONTIN Take 1 capsule (400 mg total) by mouth 2 (two) times daily.   levothyroxine 150 MCG tablet Commonly known as:  SYNTHROID, LEVOTHROID TAKE 1 TABLET BY MOUTH DAILY BEFORE BREAKFAST   lisinopril 20 MG tablet Commonly known as:  PRINIVIL,ZESTRIL Take 1 tablet (20 mg total) by mouth 2 (two) times daily.   Melatonin 3 MG Tabs Take 1 tablet (3 mg total) by mouth at bedtime.   metFORMIN 500 MG (MOD) 24 hr tablet Commonly known as:  GLUMETZA Take 1 tablet (500 mg total) by mouth daily with breakfast.   metFORMIN 500 MG tablet Commonly known as:  GLUCOPHAGE TAKE ONE TABLET EVERY DAY WITH BREAKFAST   metoprolol 50 MG tablet Commonly known as:  LOPRESSOR Take 0.5 tablets (25 mg total) by mouth 2 (two) times daily.   MICROLET LANCETS Misc TEST BLOOD GLUCOSE LEVEL ONCE DAILY   nitrofurantoin (macrocrystal-monohydrate) 100 MG capsule Commonly known as:  MACROBID Take 1 capsule (100 mg total) by mouth 2 (two) times daily.   pantoprazole 40 MG tablet Commonly known as:  PROTONIX Take 1 tablet (40 mg total) by mouth 2 (two) times daily. What changed:  when to take this   QUEtiapine 25 MG tablet Commonly known as:  SEROQUEL Take 1 tablet (25 mg total) by mouth at bedtime.   tiotropium 18 MCG inhalation  capsule Commonly known as:  SPIRIVA Place 1 capsule (18 mcg total) into inhaler and inhale daily.   venlafaxine XR 37.5 MG 24 hr capsule Commonly known as:  EFFEXOR XR Take 1 capsule (37.5 mg total) by mouth daily with breakfast.   Vitamin D-3 1000 units Caps Take 1 capsule (1,000 Units total) by mouth daily.        DISCHARGE INSTRUCTIONS:  See AVS.  If you experience worsening of your admission symptoms, develop shortness of breath, life threatening emergency, suicidal or homicidal thoughts you must seek medical attention immediately by calling 911 or calling your MD immediately  if symptoms less severe.  You Must read complete instructions/literature along with all the possible adverse reactions/side effects for all the Medicines you take and that have been prescribed to you. Take any new Medicines after you have completely understood and accpet all the possible  adverse reactions/side effects.   Please note  You were cared for by a hospitalist during your hospital stay. If you have any questions about your discharge medications or the care you received while you were in the hospital after you are discharged, you can call the unit and asked to speak with the hospitalist on call if the hospitalist that took care of you is not available. Once you are discharged, your primary care physician will handle any further medical issues. Please note that NO REFILLS for any discharge medications will be authorized once you are discharged, as it is imperative that you return to your primary care physician (or establish a relationship with a primary care physician if you do not have one) for your aftercare needs so that they can reassess your need for medications and monitor your lab values.    On the day of Discharge:  VITAL SIGNS:  Blood pressure (!) 92/40, pulse 96, temperature (!) 100.8 F (38.2 C), temperature source Oral, resp. rate 18, height 5\' 6"  (1.676 m), weight 201 lb (91.2 kg), SpO2  (!) 83 %. PHYSICAL EXAMINATION:  GENERAL:  82 y.o.-year-old patient lying in the bed with no acute distress.  EYES: Pupils equal, round, reactive to light and accommodation. No scleral icterus. Extraocular muscles intact.  HEENT: Head atraumatic, normocephalic. Oropharynx and nasopharynx clear.  NECK:  Supple, no jugular venous distention. No thyroid enlargement, no tenderness.  LUNGS: Normal breath sounds bilaterally, no wheezing, rales,rhonchi or crepitation. No use of accessory muscles of respiration.  CARDIOVASCULAR: S1, S2 normal. No murmurs, rubs, or gallops.  ABDOMEN: Soft, non-tender, non-distended. Bowel sounds present. No organomegaly or mass.  EXTREMITIES: No pedal edema, cyanosis, or clubbing.  NEUROLOGIC: Cranial nerves II through XII are intact. Muscle strength 4/5 in all extremities. Sensation intact. Gait not checked.  PSYCHIATRIC: The patient is alert and oriented x 3.  SKIN: No obvious rash, lesion, or ulcer.  DATA REVIEW:   CBC  Recent Labs Lab 06/19/16 0548  WBC 10.0  HGB 7.8*  HCT 24.2*  PLT 307    Chemistries   Recent Labs Lab 06/19/16 0548  NA 143  K 4.5  CL 106  CO2 30  GLUCOSE 167*  BUN 15  CREATININE 0.85  CALCIUM 7.7*     Microbiology Results  Results for orders placed or performed during the hospital encounter of 06/17/16  Surgical pcr screen     Status: None   Collection Time: 06/17/16  7:46 PM  Result Value Ref Range Status   MRSA, PCR NEGATIVE NEGATIVE Final   Staphylococcus aureus NEGATIVE NEGATIVE Final    Comment:        The Xpert SA Assay (FDA approved for NASAL specimens in patients over 81 years of age), is one component of a comprehensive surveillance program.  Test performance has been validated by [facility] for patients greater than or equal to 77 year old. It is not intended to diagnose infection nor to guide or monitor treatment.     RADIOLOGY:  Dg Hip Operative Unilat W Or W/o Pelvis Left  Result Date:  06/18/2016 CLINICAL DATA:  Intra medullary rod placement left femur EXAM: OPERATIVE left HIP (WITH PELVIS IF PERFORMED) 9 VIEWS TECHNIQUE: Fluoroscopic spot image(s) were submitted for interpretation post-operatively. COMPARISON:  Pelvic radiograph 06/17/2016 FINDINGS: Fluoroscopic images were obtained during the placement of an antegrade intra medullary rod with interlocking screw in the left femur, traversing an intertrochanteric fracture. Fluoroscopy time was reported as 64 seconds. IMPRESSION: Intraoperative images obtained  during left femoral internal fixation. Electronically Signed   By: Ulyses Jarred M.D.   On: 06/18/2016 22:13     Management plans discussed with the patient, family and they are in agreement.  CODE STATUS:  Code Status History    Date Active Date Inactive Code Status Order ID Comments User Context   06/18/2016  1:23 AM 06/18/2016  9:47 PM Full Code [id number]  Dereck Leep, MD Inpatient   06/18/2016  1:22 AM 06/18/2016  1:23 AM Full Code [id number]  Lance Coon, MD Inpatient   05/18/2016  1:24 PM 05/20/2016  3:41 PM Full Code [id number]  Loletha Grayer, MD ED   07/18/2015  7:41 AM 07/22/2015  7:46 PM Full Code [id number]  Theodoro Grist, MD ED   05/14/2015  7:37 PM 05/18/2015  1:48 PM Full Code [id number]  Gladstone Lighter, MD Inpatient    Advance Directive Documentation   Flowsheet Row Most Recent Value  Type of Advance Directive  Living will, Healthcare Power of Attorney  Pre-existing out of facility DNR order (yellow form or pink MOST form)  No data  "MOST" Form in Place?  No data      TOTAL TIME TAKING CARE OF THIS PATIENT: 33 minutes.    Demetrios Loll M.D on 06/19/2016 at 4:23 PM  Between 7am to 6pm - Pager - [phone]  After 6pm go to [email or web address] - Proofreader  [facility]  Office  [phone]  CC: Primary care physician; Park Liter, DO   Note: This dictation was prepared with Dragon dictation along with smaller  phrase technology. Any transcriptional errors that result from this process are unintentional.

## 2016-06-19 NOTE — Progress Notes (Signed)
Subjective: 1 Day Post-Op Procedure(s) (LRB): INTRAMEDULLARY (IM) NAIL INTERTROCHANTRIC (Left) Patient reports pain as mild and Does not really understand pain scale..   Patient is well, and has had no acute complaints or problems We will start therapy today.  Plan is to go Rehab after hospital stay. no nausea and no vomiting Patient denies any chest pains or shortness of breath. Patient sitting up in bed while awake. Does not appear to be in any significant discomfort.\ Joking around this morning.  Objective: Vital signs in last 24 hours: Temp:  [97.8 F (36.6 C)-99.5 F (37.5 C)] 99.5 F (37.5 C) (12/01 0554) Pulse Rate:  [72-96] 82 (12/01 0554) Resp:  [11-23] 18 (12/01 0554) BP: (95-167)/(41-96) 132/66 (12/01 0554) SpO2:  [88 %-100 %] 100 % (12/01 0554) well approximated incision Heels are non tender and elevated off the bed using rolled towels Intake/Output from previous day: 11/30 0701 - 12/01 0700 In: 920 [P.O.:120; I.V.:800] Out: 1375 [Urine:1075; Blood:300] Intake/Output this shift: No intake/output data recorded.   Recent Labs  06/17/16 2004 06/18/16 0405 06/19/16 0548  HGB 10.4* 9.4* 7.8*    Recent Labs  06/18/16 0405 06/19/16 0548  WBC 9.8 10.0  RBC 3.28* 2.70*  HCT 29.2* 24.2*  PLT 344 307    Recent Labs  06/18/16 0405 06/19/16 0548  NA 141 143  K 4.0 4.5  CL 102 106  CO2 32 30  BUN 17 15  CREATININE 0.94 0.85  GLUCOSE 230* 167*  CALCIUM 8.0* 7.7*   No results for input(s): LABPT, INR in the last 72 hours.  EXAM General - Patient is Alert, Appropriate and Oriented Extremity - Neurovascular intact Sensation intact distally Intact pulses distally No cellulitis present Compartment soft Dressing - scant drainage and Appear to be mostly serosanguineous drainage Motor Function - intact, moving foot and toes well on exam. I was unable to get the patient to dorsiflex or plantarflex her left foot.  Past Medical History:  Diagnosis  Date  . Adenomatous polyps   . Age-related osteoporosis without current pathological fracture   . Anemia   . Anxiety   . Atherosclerosis of aorta ([place])   . Atrial fibrillation with RVR ([place])   . Barrett's esophagus without dysplasia   . CHF (congestive heart failure) ([place])   . Colitis   . COPD (chronic obstructive pulmonary disease) ([place])   . Dementia   . Depression   . Diabetes mellitus type II, uncontrolled ([place])   . Diverticulitis   . Diverticulosis of intestine without perforation or abscess without bleeding   . Edema   . Essential hemorrhagic thrombocythemia ([place])   . Gastrointestinal hemorrhage   . High anion gap metabolic acidosis   . Hyperlipidemia   . Hypertension   . Hypertensive chronic kidney disease with stage 1 through stage 4 chronic kidney disease, or unspecified chronic kidney disease   . Hypothyroidism   . Hypoxemia   . Iron deficiency anemia   . Lactic acidosis   . Left bundle branch block   . Nonrheumatic mitral valve insufficiency   . Osteoarthritis   . Other psychoactive substance dependence, in remission ([place])   . Other specified symptoms and signs involving the circulatory and respiratory systems   . Paroxysmal atrial fibrillation (HCC)    s/p cardioversion  . Personal history of peptic ulcer disease   . Pulmonary edema   . PVD (peripheral vascular disease) ([place]) 05/14/2015  . Recurrent pneumonia    history of  . Respiratory failure with hypoxia  and hypercapnia ([place])   . Type 2 diabetes mellitus with diabetic chronic kidney disease ([place])   . Type 2 diabetes mellitus with diabetic polyneuropathy ([place])   . Vascular dementia without behavioral disturbance   . 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   Weight as of this encounter: 91.2 kg (201 lb). Advance diet Up with therapy Plan for discharge tomorrow Discharge to SNF  Labs: Were reviewed. Hemoglobin 7.8. Patient however is asymptomatic clinically and through her vital signs. DVT Prophylaxis - Coumadin, Foot Pumps and TED hose Weight-Bearing as tolerated to left  leg D/C O2 and Pulse OX and try on Room Air Begin working on bowel movement Labs tomorrow morning Continue Lovenox 40 mg daily for 14 days at discharge Change dressing as needed Return to clinic 2 weeks for staple removal and reevaluation We'll monitor hemoglobin if she becomes symptomatic or vital signs change may need to transfuse packed RBCs  Talena Neira R. Pamplico Magoffin 06/19/2016, 7:23 AM

## 2016-06-19 NOTE — Anesthesia Postprocedure Evaluation (Signed)
Anesthesia Post Note  Patient: Theresa Vasquez  Procedure(s) Performed: Procedure(s) (LRB): INTRAMEDULLARY (IM) NAIL INTERTROCHANTRIC (Left)  Patient location during evaluation: PACU Anesthesia Type: General Level of consciousness: awake and alert Pain management: pain level controlled Vital Signs Assessment: post-procedure vital signs reviewed and stable Respiratory status: spontaneous breathing, nonlabored ventilation, respiratory function stable and patient connected to nasal cannula oxygen Cardiovascular status: blood pressure returned to baseline and stable Postop Assessment: no signs of nausea or vomiting Anesthetic complications: no    Last Vitals:  Vitals:   06/19/16 0554 06/19/16 0729  BP: 132/66 (!) 119/44  Pulse: 82 77  Resp: 18 18  Temp: 37.5 C 37.1 C    Last Pain:  Vitals:   06/19/16 0729  TempSrc: Oral  PainSc:                  Martha Clan

## 2016-06-20 ENCOUNTER — Inpatient Hospital Stay: Payer: Medicare Other

## 2016-06-20 LAB — PREPARE RBC (CROSSMATCH)

## 2016-06-20 LAB — GLUCOSE, CAPILLARY
GLUCOSE-CAPILLARY: 177 mg/dL — AB (ref 65–99)
GLUCOSE-CAPILLARY: 163 mg/dL — AB (ref 65–99)
GLUCOSE-CAPILLARY: 182 mg/dL — AB (ref 65–99)
Glucose-Capillary: 150 mg/dL — ABNORMAL HIGH (ref 65–99)

## 2016-06-20 LAB — BASIC METABOLIC PANEL
ANION GAP: 7 (ref 5–15)
BUN: 15 mg/dL (ref 6–20)
CO2: 29 mmol/L (ref 22–32)
CREATININE: 0.83 mg/dL (ref 0.44–1.00)
Calcium: 7.8 mg/dL — ABNORMAL LOW (ref 8.9–10.3)
Chloride: 101 mmol/L (ref 101–111)
GFR calc Af Amer: >60 mL/min (ref >60)
Glucose, Bld: 184 mg/dL — ABNORMAL HIGH (ref 65–99)
POTASSIUM: 3.7 mmol/L (ref 3.5–5.1)
Sodium: 137 mmol/L (ref 135–145)

## 2016-06-20 LAB — CBC
HEMATOCRIT: 21.5 % — AB (ref 35.0–47.0)
HEMOGLOBIN: 7.1 g/dL — AB (ref 12.0–16.0)
MCH: 29.4 pg (ref 26.0–34.0)
MCHC: 33.2 g/dL (ref 32.0–36.0)
MCV: 88.6 fL (ref 80.0–100.0)
Platelets: 333 10*3/uL (ref 150–440)
RBC: 2.43 MIL/uL — ABNORMAL LOW (ref 3.80–5.20)
RDW: 14.7 % — AB (ref 11.5–14.5)
WBC: 10 10*3/uL (ref 3.6–11.0)

## 2016-06-20 MED ORDER — SODIUM CHLORIDE 0.9 % IV SOLN
Freq: Once | INTRAVENOUS | Status: AC
  Administered 2016-06-20: 10:00:00 via INTRAVENOUS

## 2016-06-20 MED ORDER — IPRATROPIUM-ALBUTEROL 0.5-2.5 (3) MG/3ML IN SOLN: 3.0000 mL | Freq: Four times a day (QID) | RESPIRATORY_TRACT | Status: DC

## 2016-06-20 MED ORDER — FUROSEMIDE 10 MG/ML IJ SOLN
20.0000 mg | Freq: Two times a day (BID) | INTRAMUSCULAR | Status: DC
  Administered 2016-06-20 – 2016-06-22 (×4): 20 mg via INTRAVENOUS
  Filled 2016-06-20 (×4): qty 2

## 2016-06-20 MED ORDER — FUROSEMIDE 10 MG/ML IJ SOLN
20.0000 mg | Freq: Once | INTRAMUSCULAR | Status: AC
  Administered 2016-06-20: 20 mg via INTRAVENOUS
  Filled 2016-06-20: qty 2

## 2016-06-20 NOTE — Progress Notes (Addendum)
Per MD pt will not discharge today. Possible discharge tomorrow 06/20/16. Updated pt's son.  Ernest Pine, MSW, LCSW, [place] Clinical Social Worker [phone]

## 2016-06-20 NOTE — Progress Notes (Signed)
PT Cancellation Note  Patient Details Name: Theresa Vasquez MRN: [id number] DOB: 11/27/1930   Cancelled Treatment:    Reason Eval/Treat Not Completed: Medical issues which prohibited therapy   Session held this am.  HGB 7.1 and having blood transfusion.  Will attempt this pm when finished.     Chesley Noon 06/20/2016, 2:43 PM

## 2016-06-20 NOTE — Progress Notes (Signed)
Called prime [facility] and spoke to Dr. Doy Hutching regarding patient's continuing low o2 sats. He ordered a portable chest xray, and I called RT for a breathing treatment. Continue to monitor closely.

## 2016-06-20 NOTE — Progress Notes (Addendum)
Physical Therapy Treatment Patient Details Name: Theresa Vasquez MRN: [id number] DOB: 31-Jul-1930 Today's Date: 06/20/2016    History of Present Illness Pt is an 82 y.o. female s/p fall with L hip pain and found to have displaced L intertrochanteric femur fx.  Pt s/p L IMN 06/18/16.  PMH includes dementia, a-fib with RVR, CHF, COPD, DM, htn.    PT Comments    Pt finished with blood transfusion. Participated in exercises as described below.  Max a x 2 to edge of bed, resisting at times.  Pt was able to sit edge of bed x 10 minutes without loss of balance.  Attempted to stand x 2 but pt put no effort into assisting "I can't"  Pt unsafe to transfer to recliner at this time without life system.   Will continue as appropriate.   Follow Up Recommendations   SNF     Equipment Recommendations    to be determined by SNF facility upon d/c   Recommendations for Other Services       Precautions / Restrictions Precautions Precautions: Fall Restrictions Weight Bearing Restrictions: Yes LLE Weight Bearing: Weight bearing as tolerated    Mobility  Bed Mobility Overal bed mobility: +2 for physical assistance Bed Mobility: Supine to Sit;Sit to Supine     Supine to sit: Max assist;+2 for physical assistance Sit to supine: Max assist;+2 for physical assistance   General bed mobility comments: assists little with mobility skills, resists at times  Transfers Overall transfer level: Needs assistance Equipment used: Rolling walker (2 wheeled) Transfers: Sit to/from Stand Sit to Stand: Max assist;+2 safety/equipment         General transfer comment: Pt puts no effort in attempts to stand x 2 - not safe to transfer  Ambulation/Gait             General Gait Details: Not appropriate at this time   Stairs            Wheelchair Mobility    Modified Rankin (Stroke Patients Only)       Balance Overall balance assessment: Needs assistance Sitting-balance support: Feet  supported Sitting balance-Leahy Scale: Fair Sitting balance - Comments: able to sit for 10 minutes wihtout lob's     Standing balance-Leahy Scale: Zero                      Cognition Arousal/Alertness: Awake/alert Behavior During Therapy: WFL for tasks assessed/performed Overall Cognitive Status: History of cognitive impairments - at baseline                      Exercises Total Joint Exercises Ankle Circles/Pumps: AAROM;Strengthening;Both;10 reps;Supine Quad Sets: AAROM;Strengthening;Both;10 reps;Supine Short Arc Quad: AAROM;Strengthening;Left;10 reps;Supine Heel Slides: AAROM;Strengthening;Left;10 reps;Supine Hip ABduction/ADduction: AAROM;Strengthening;Left;10 reps;Supine    General Comments        Pertinent Vitals/Pain Pain Assessment: 0-10 Pain Score: 10-Worst pain ever Pain Location: L hip Pain Descriptors / Indicators: Moaning;Operative site guarding;Sore Pain Intervention(s): Limited activity within patient's tolerance;Ice applied;Premedicated before session    Home Living                      Prior Function            PT Goals (current goals can now be found in the care plan section)      Frequency           PT Plan      Co-evaluation  End of Session           Time: [id number] PT Time Calculation (min) (ACUTE ONLY): 29 min  Charges:  $Therapeutic Exercise: 8-22 mins $Therapeutic Activity: 8-22 mins                    G Codes:      Chesley Noon 07/03/2016, 2:49 PM

## 2016-06-20 NOTE — Progress Notes (Signed)
OT Cancellation Note  Patient Details Name: JAELEEN BREITNER MRN: [id number] DOB: 1930/11/12   Cancelled Treatment:    Reason Eval/Treat Not Completed: Other (comment) (pt unavailable, working with PT). Will attempt to treat at later date/time when pt is available.  Corky Sox, OTR/L 06/20/2016, 2:26 PM

## 2016-06-20 NOTE — Progress Notes (Signed)
Subjective: 2 Days Post-Op Procedure(s) (LRB): INTRAMEDULLARY (IM) NAIL INTERTROCHANTRIC (Left)  Patient not able to give a number for pain rating 2/2dementia .   Patient is well, and has had no acute complaints or problems Continue with physical  therapy today.  Plan is to go Rehab after hospital stay. no nausea and no vomiting Patient denies any chest pains or shortness of breath. Objective: Vital signs in last 24 hours: Temp:  [98.8 F (37.1 C)-100.8 F (38.2 C)] 99 F (37.2 C) (12/02 0403) Pulse Rate:  [77-103] 80 (12/02 0403) Resp:  [18-20] 20 (12/02 0403) BP: (92-133)/(39-46) 133/45 (12/02 0403) SpO2:  [83 %-93 %] 92 % (12/02 0403) well approximated incision Heels are non tender and elevated off the bed using rolled towels Intake/Output from previous day: 12/01 0701 - 12/02 0700 In: 600 [P.O.:600] Out: -  Intake/Output this shift: No intake/output data recorded.   Recent Labs  06/17/16 2004 06/18/16 0405 06/19/16 0548 06/20/16 0410  HGB 10.4* 9.4* 7.8* 7.1*    Recent Labs  06/19/16 0548 06/20/16 0410  WBC 10.0 10.0  RBC 2.70* 2.43*  HCT 24.2* 21.5*  PLT 307 333    Recent Labs  06/19/16 0548 06/20/16 0410  NA 143 137  K 4.5 3.7  CL 106 101  CO2 30 29  BUN 15 15  CREATININE 0.85 0.83  GLUCOSE 167* 184*  CALCIUM 7.7* 7.8*   No results for input(s): LABPT, INR in the last 72 hours.  EXAM General - Patient is Alert, Disorganized and Confused Extremity - Neurologically intact Neurovascular intact Intact pulses distally No cellulitis present Compartment soft some tightness to the left thigh Dressing - scant drainage Motor Function - intact, moving foot and toes well on exam. Still unable to get pt to either pull up or push down with left foot  Past Medical History:  Diagnosis Date  . Adenomatous polyps   . Age-related osteoporosis without current pathological fracture   . Anemia   . Anxiety   . Atherosclerosis of aorta ([place])   .  Atrial fibrillation with RVR ([place])   . Barrett's esophagus without dysplasia   . CHF (congestive heart failure) ([place])   . Colitis   . COPD (chronic obstructive pulmonary disease) ([place])   . Dementia   . Depression   . Diabetes mellitus type II, uncontrolled ([place])   . Diverticulitis   . Diverticulosis of intestine without perforation or abscess without bleeding   . Edema   . Essential hemorrhagic thrombocythemia ([place])   . Gastrointestinal hemorrhage   . High anion gap metabolic acidosis   . Hyperlipidemia   . Hypertension   . Hypertensive chronic kidney disease with stage 1 through stage 4 chronic kidney disease, or unspecified chronic kidney disease   . Hypothyroidism   . Hypoxemia   . Iron deficiency anemia   . Lactic acidosis   . Left bundle branch block   . Nonrheumatic mitral valve insufficiency   . Osteoarthritis   . Other psychoactive substance dependence, in remission ([place])   . Other specified symptoms and signs involving the circulatory and respiratory systems   . Paroxysmal atrial fibrillation (HCC)    s/p cardioversion  . Personal history of peptic ulcer disease   . Pulmonary edema   . PVD (peripheral vascular disease) ([place]) 05/14/2015  . Recurrent pneumonia    history of  . Respiratory failure with hypoxia and hypercapnia ([place])   . Type 2 diabetes mellitus with diabetic chronic kidney disease ([place])   .  Type 2 diabetes mellitus with diabetic polyneuropathy ([place])   . Vascular dementia without behavioral disturbance   . Vascular disorder of intestine (HCC)     Assessment/Plan: 2 Days Post-Op Procedure(s) (LRB): INTRAMEDULLARY (IM) NAIL INTERTROCHANTRIC (Left) Principal Problem:   Closed left hip fracture (HCC) Active Problems:   Type 2 DM with CKD stage 2 and hypertension (HCC)   COPD, severe (HCC)   Anxiety disorder   Chronic diastolic CHF (congestive heart failure) (HCC)   Paroxysmal atrial fibrillation (HCC)   Adult hypothyroidism  Estimated body mass  index is 32.44 kg/m as calculated from the following:   Height as of this encounter: 5\' 6"  (1.676 m).   Weight as of this encounter: 91.2 kg (201 lb). Up with therapy Discharge to SNF  Labs: reviewed. hgb down to 7.1 DVT Prophylaxis - Lovenox, Foot Pumps and TED hose Weight-Bearing as tolerated to left leg Pt needs a bowel movement prior to d/c Please change dressing prior to d/c Consider transfusion of one unit 2/2 continual decrease in hgb and pt fatigue  Continue Lovenox 40 mg daily for 14 days at discharge Change dressing as needed Return to clinic 2 weeks for staple removal and reevaluation  Jon R. [place] 06/20/2016, 7:00 AM

## 2016-06-20 NOTE — Progress Notes (Signed)
[place] [facility] [place] NAME: Karina Brasseur    MR#:  [id number]  DATE OF BIRTH:  08/16/1930  SUBJECTIVE:  CHIEF COMPLAINT:   Chief Complaint  Patient presents with  . Hip Pain  . Fall   No complaint. She drowsy after pain medication. REVIEW OF SYSTEMS:  Review of Systems  Constitutional: Negative for chills, fever and malaise/fatigue.  HENT: Negative for congestion.   Eyes: Negative for blurred vision and double vision.  Respiratory: Negative for cough, shortness of breath and stridor.   Cardiovascular: Negative for chest pain and leg swelling.  Gastrointestinal: Negative for abdominal pain, blood in stool, diarrhea, melena, nausea and vomiting.  Genitourinary: Negative for dysuria.  Musculoskeletal: Negative for back pain and joint pain.  Neurological: Negative for dizziness, focal weakness and loss of consciousness.  Psychiatric/Behavioral: Negative for depression. The patient is not nervous/anxious.     DRUG ALLERGIES:  No Known Allergies VITALS:  Blood pressure (!) 135/48, pulse 84, temperature 99.7 F (37.6 C), temperature source Axillary, resp. rate 18, height 5\' 6"  (1.676 m), weight 201 lb (91.2 kg), SpO2 (!) 88 %. PHYSICAL EXAMINATION:  Physical Exam  Constitutional: She is oriented to person, place, and time and well-developed, well-nourished, and in no distress.  HENT:  Head: Normocephalic.  Mouth/Throat: Oropharynx is clear and moist.  Eyes: Conjunctivae and EOM are normal.  Neck: Normal range of motion. Neck supple. No JVD present. No tracheal deviation present.  Cardiovascular: Normal rate, regular rhythm and normal heart sounds.  Exam reveals no gallop.   No murmur heard. Pulmonary/Chest: Effort normal and breath sounds normal. No respiratory distress. She has no wheezes. She has no rales.  Abdominal: Soft. She exhibits no distension. There is no tenderness.  Musculoskeletal: She exhibits no edema or tenderness.    Neurological: She is alert and oriented to person, place, and time. No cranial nerve deficit.  Skin: No rash noted. No erythema.  Psychiatric: Affect and judgment normal.   LABORATORY PANEL:   CBC  Recent Labs Lab 06/20/16 0410  WBC 10.0  HGB 7.1*  HCT 21.5*  PLT 333   ------------------------------------------------------------------------------------------------------------------ Chemistries   Recent Labs Lab 06/20/16 0410  NA 137  K 3.7  CL 101  CO2 29  GLUCOSE 184*  BUN 15  CREATININE 0.83  CALCIUM 7.8*   RADIOLOGY:  No results found. ASSESSMENT AND PLAN:   Closed left hip fracture S/p ORIF POD2, pain control, DVT prophylaxis and PT.  * Acute respiratory failure with hypoxia. Continue oxygen [place] 2 L, NEB prn.  Type 2 DM with CKD stage 2 and hypertension (HCC) - sliding scale insulin with corresponding glucose checks   COPD, severe ([place]) - continue home inhalers, stable.   Anxiety disorder - home dose anxiolytics   Chronic diastolic CHF (congestive heart failure) (HCC) - none in exacerbation, continue home meds   Paroxysmal atrial fibrillation ([place]) - continue home meds   Adult hypothyroidism - home dose thyroid replacement Symptomatic anemia, acute on chronic disease, due to acute blood lose.  Hb down to 7.1. One units PRBC transfusion today. Follow-up hemoglobin in am.  All the records are reviewed and case discussed with Care Management/Social Worker. Management plans discussed with the patient, her caregiver and they are in agreement.  CODE STATUS: Full code  TOTAL TIME TAKING CARE OF THIS PATIENT: 36 minutes.   More than 50% of the time was spent in counseling/coordination of care: YES  POSSIBLE D/C IN 2 DAYS,  DEPENDING ON CLINICAL CONDITION.   Demetrios Loll M.D on 06/20/2016 at 3:19 PM  Between 7am to 6pm - Pager - [phone]  After 6pm go to [email or web address] - Proofreader  [facility]  Office   [phone]  CC: Primary care physician; Park Liter, DO  Note: This dictation was prepared with Dragon dictation along with smaller phrase technology. Any transcriptional errors that result from this process are unintentional.

## 2016-06-21 LAB — TYPE AND SCREEN
ABO/RH(D): O POS
ANTIBODY SCREEN: NEGATIVE
Unit division: 0
Unit division: 0

## 2016-06-21 LAB — CBC
HEMATOCRIT: 26.2 % — AB (ref 35.0–47.0)
Hemoglobin: 8.7 g/dL — ABNORMAL LOW (ref 12.0–16.0)
MCH: 29.2 pg (ref 26.0–34.0)
MCHC: 33.1 g/dL (ref 32.0–36.0)
MCV: 88.2 fL (ref 80.0–100.0)
Platelets: 408 10*3/uL (ref 150–440)
RBC: 2.97 MIL/uL — ABNORMAL LOW (ref 3.80–5.20)
RDW: 14.5 % (ref 11.5–14.5)
WBC: 12.8 10*3/uL — ABNORMAL HIGH (ref 3.6–11.0)

## 2016-06-21 LAB — GLUCOSE, CAPILLARY
GLUCOSE-CAPILLARY: 181 mg/dL — AB (ref 65–99)
Glucose-Capillary: 208 mg/dL — ABNORMAL HIGH (ref 65–99)
Glucose-Capillary: 262 mg/dL — ABNORMAL HIGH (ref 65–99)
Glucose-Capillary: 234 mg/dL — ABNORMAL HIGH (ref 65–99)

## 2016-06-21 LAB — BASIC METABOLIC PANEL
Anion gap: 6 (ref 5–15)
BUN: 14 mg/dL (ref 6–20)
CALCIUM: 7.9 mg/dL — AB (ref 8.9–10.3)
CO2: 30 mmol/L (ref 22–32)
CREATININE: 0.78 mg/dL (ref 0.44–1.00)
Chloride: 100 mmol/L — ABNORMAL LOW (ref 101–111)
GFR calc Af Amer: >60 mL/min (ref >60)
GFR calc non Af Amer: >60 mL/min (ref >60)
GLUCOSE: 177 mg/dL — AB (ref 65–99)
Potassium: 4.1 mmol/L (ref 3.5–5.1)
Sodium: 136 mmol/L (ref 135–145)

## 2016-06-21 MED ORDER — PREDNISONE 20 MG PO TABS
20.0000 mg | ORAL_TABLET | Freq: Every day | ORAL | Status: DC
  Administered 2016-06-21 – 2016-06-23 (×3): 20 mg via ORAL
  Filled 2016-06-21 (×3): qty 1

## 2016-06-21 MED ORDER — GUAIFENESIN-DM 100-10 MG/5ML PO SYRP: 5.0000 mL | ORAL_SOLUTION | ORAL | Status: DC | PRN

## 2016-06-21 MED ORDER — GUAIFENESIN ER 600 MG PO TB12
600.0000 mg | ORAL_TABLET | Freq: Two times a day (BID) | ORAL | Status: DC
  Administered 2016-06-21 – 2016-06-23 (×5): 600 mg via ORAL
  Filled 2016-06-21 (×5): qty 1

## 2016-06-21 MED ORDER — PIPERACILLIN-TAZOBACTAM 3.375 G IVPB
3.3750 g | Freq: Three times a day (TID) | INTRAVENOUS | Status: DC
  Administered 2016-06-21 – 2016-06-23 (×6): 3.375 g via INTRAVENOUS
  Filled 2016-06-21 (×6): qty 50

## 2016-06-21 NOTE — Progress Notes (Signed)
Subjective: Day #4 Procedure(s) (LRB): INTRAMEDULLARY (IM) NAIL INTERTROCHANTRIC (Left) Patient reports pain as moderate.   Patient is well, and has had no acute complaints or problems Continue with physical therapy today.  Plan is to go Rehab after hospital stay. no nausea and no vomiting Patient denies any chest pains or shortness of breath. Objective: Vital signs in last 24 hours: Temp:  [98 F (36.7 C)-100 F (37.8 C)] 98 F (36.7 C) (12/03 0730) Pulse Rate:  [73-94] 77 (12/03 0730) Resp:  [18-20] 18 (12/03 0730) BP: (118-143)/(42-57) 142/52 (12/03 0730) SpO2:  [77 %-95 %] 95 % (12/03 0730) well approximated incision Heels are non tender and elevated off the bed using rolled towels Intake/Output from previous day: 12/02 0701 - 12/03 0700 In: 919 [P.O.:600; Blood:319] Out: -  Intake/Output this shift: No intake/output data recorded.   Recent Labs  06/19/16 0548 06/20/16 0410 06/21/16 0436  HGB 7.8* 7.1* 8.7*    Recent Labs  06/20/16 0410 06/21/16 0436  WBC 10.0 12.8*  RBC 2.43* 2.97*  HCT 21.5* 26.2*  PLT 333 408    Recent Labs  06/20/16 0410 06/21/16 0436  NA 137 136  K 3.7 4.1  CL 101 100*  CO2 29 30  BUN 15 14  CREATININE 0.83 0.78  GLUCOSE 184* 177*  CALCIUM 7.8* 7.9*   No results for input(s): LABPT, INR in the last 72 hours.  EXAM General - Patient is Alert, Disorganized and Confused Extremity - Neurologically intact Neurovascular intact Sensation intact distally Intact pulses distally Compartment soft Dressing - moderate drainage Motor Function - intact, moving foot and toes well on exam.   Lungs: Congestion does appear to be worse today. Did have a chest x-ray yesterday which shows possible early pneumonia. Patient not intellectually able to understand use of the incentive spirometer which is in the room.  Past Medical History:  Diagnosis Date  . Adenomatous polyps   . Age-related osteoporosis without current pathological  fracture   . Anemia   . Anxiety   . Atherosclerosis of aorta ([place])   . Atrial fibrillation with RVR ([employer])   . Barrett's esophagus without dysplasia   . CHF (congestive heart failure) ([place])   . Colitis   . COPD (chronic obstructive pulmonary disease) ([place])   . Dementia   . Depression   . Diabetes mellitus type II, uncontrolled ([place])   . Diverticulitis   . Diverticulosis of intestine without perforation or abscess without bleeding   . Edema   . Essential hemorrhagic thrombocythemia ([place])   . Gastrointestinal hemorrhage   . High anion gap metabolic acidosis   . Hyperlipidemia   . Hypertension   . Hypertensive chronic kidney disease with stage 1 through stage 4 chronic kidney disease, or unspecified chronic kidney disease   . Hypothyroidism   . Hypoxemia   . Iron deficiency anemia   . Lactic acidosis   . Left bundle branch block   . Nonrheumatic mitral valve insufficiency   . Osteoarthritis   . Other psychoactive substance dependence, in remission ([place])   . Other specified symptoms and signs involving the circulatory and respiratory systems   . Paroxysmal atrial fibrillation (HCC)    s/p cardioversion  . Personal history of peptic ulcer disease   . Pulmonary edema   . PVD (peripheral vascular disease) ([place]) 05/14/2015  . Recurrent pneumonia    history of  . Respiratory failure with hypoxia and hypercapnia ([place])   . Type 2 diabetes mellitus with diabetic chronic kidney disease (  [place])   . Type 2 diabetes mellitus with diabetic polyneuropathy ([place])   . Vascular dementia without behavioral disturbance   . Vascular disorder of intestine (HCC)     Assessment/Plan: 3 Days Post-Op Procedure(s) (LRB): INTRAMEDULLARY (IM) NAIL INTERTROCHANTRIC (Left) Principal Problem:   Closed left hip fracture (HCC) Active Problems:   Type 2 DM with CKD stage 2 and hypertension (HCC)   COPD, severe (HCC)   Anxiety disorder   Chronic diastolic CHF (congestive heart failure) (HCC)   Paroxysmal  atrial fibrillation (HCC)   Adult hypothyroidism  Estimated body mass index is 32.44 kg/m as calculated from the following:   Height as of this encounter: 5\' 6"  (1.676 m).   Weight as of this encounter: 91.2 kg (201 lb). Up with therapy  Labs: Hemoglobin has increased to 8.7 DVT Prophylaxis - Lovenox, Foot Pumps and TED hose Weight-Bearing as tolerated to left leg O2 and Pulse OX and try on Room Air Respiratory therapy for breathing treatments since she is not able to understand how to use the incentive spirometer. From an orthopedic standpoint she is ready to be discharged. Awaiting medical clearance. Patient needs to have a bowel movement today Continue Lovenox 40 mg daily for 14 days at discharge Change dressing as needed Return to clinic 2 weeks for staple removal and reevaluation  Jon R. [place] 06/21/2016, 7:38 AM

## 2016-06-21 NOTE — Progress Notes (Signed)
Pharmacy Antibiotic Note  Theresa Vasquez is a 82 y.o. female admitted on 06/17/2016 with Closed hip fracture, now Dx with Aspiration PNA. Pharmacy has been consulted for Zosyn dosing.  Plan: Will start patient on Zosyn 3.375 V EI every 8 hours.   Height: 5\' 6"  (167.6 cm) Weight: 201 lb (91.2 kg) IBW/kg (Calculated) : 59.3  Temp (24hrs), Avg:99.2 F (37.3 C), Min:98 F (36.7 C), Max:100 F (37.8 C)   Recent Labs Lab 06/17/16 2004 06/18/16 0405 06/19/16 0548 06/20/16 0410 06/21/16 0436  WBC 7.6 9.8 10.0 10.0 12.8*  CREATININE 0.82 0.94 0.85 0.83 0.78    Estimated Creatinine Clearance: 60.6 mL/min (by C-G formula based on SCr of 0.78 mg/dL).  No Known Allergies  Antimicrobials this admission: 11/30 Cefazolin >> 12/1 12/3 Zosyn >>  Dose adjustments this admission:  Microbiology results: 11/29 MRSA PCR: negative   Thank you for allowing pharmacy to be a part of this patient's care.  Pernell Dupre, PharmD, BCPS Clinical Pharmacist 06/21/2016 11:55 AM

## 2016-06-21 NOTE — Progress Notes (Signed)
[place] at [place] NAME: Theresa Vasquez    MR#:  [id number]  DATE OF BIRTH:  09-20-1930  SUBJECTIVE:  CHIEF COMPLAINT:   Chief Complaint  Patient presents with  . Hip Pain  . Fall   Cough and shortness of breath, unable to cough out sputum, on O2 [place] 4 L. Drowsy. REVIEW OF SYSTEMS:  Review of Systems  Constitutional: Positive for malaise/fatigue. Negative for chills and fever.  HENT: Negative for congestion.   Eyes: Negative for blurred vision and double vision.  Respiratory: Positive for cough and shortness of breath. Negative for hemoptysis, sputum production, wheezing and stridor.   Cardiovascular: Negative for chest pain and leg swelling.  Gastrointestinal: Negative for abdominal pain, blood in stool, diarrhea, melena, nausea and vomiting.  Genitourinary: Negative for dysuria.  Musculoskeletal: Negative for back pain and joint pain.  Neurological: Positive for weakness. Negative for dizziness, focal weakness and loss of consciousness.  Psychiatric/Behavioral: Negative for depression. The patient is not nervous/anxious.     DRUG ALLERGIES:  No Known Allergies VITALS:  Blood pressure (!) 120/44, pulse 77, temperature 98.4 F (36.9 C), temperature source Oral, resp. rate 18, height 5\' 6"  (1.676 m), weight 201 lb (91.2 kg), SpO2 90 %. PHYSICAL EXAMINATION:  Physical Exam  Constitutional: She is oriented to person, place, and time and well-developed, well-nourished, and in no distress.  HENT:  Head: Normocephalic.  Mouth/Throat: Oropharynx is clear and moist.  Eyes: Conjunctivae and EOM are normal.  Neck: Normal range of motion. Neck supple. No JVD present. No tracheal deviation present.  Cardiovascular: Normal rate, regular rhythm and normal heart sounds.  Exam reveals no gallop.   No murmur heard. Pulmonary/Chest: Effort normal and breath sounds normal. No respiratory distress. She has no wheezes. She has no rales.    Diminished lung sounds. Sounds like a lot of secretion in throat.  Abdominal: Soft. She exhibits no distension. There is no tenderness.  Musculoskeletal: She exhibits no edema or tenderness.  Neurological: She is alert and oriented to person, place, and time. No cranial nerve deficit.  Skin: No rash noted. No erythema.  Psychiatric: Affect and judgment normal.   LABORATORY PANEL:   CBC  Recent Labs Lab 06/21/16 0436  WBC 12.8*  HGB 8.7*  HCT 26.2*  PLT 408   ------------------------------------------------------------------------------------------------------------------ Chemistries   Recent Labs Lab 06/21/16 0436  NA 136  K 4.1  CL 100*  CO2 30  GLUCOSE 177*  BUN 14  CREATININE 0.78  CALCIUM 7.9*   RADIOLOGY:  Dg Chest 1 View  Result Date: 06/20/2016 CLINICAL DATA:  Shortness of breath EXAM: CHEST 1 VIEW COMPARISON:  06/17/2016 chest radiograph. FINDINGS: Stable cardiomediastinal silhouette with mild cardiomegaly and aortic atherosclerosis. No pneumothorax. No pleural effusion. Mild hazy right upper parahilar lung opacity appears new. Mild bibasilar atelectasis. IMPRESSION: 1. Stable mild cardiomegaly. New mild hazy right upper parahilar lung opacity, which could represent mild asymmetric pulmonary edema or developing pneumonia. Recommend follow-up chest imaging to resolution. 2. Mild bibasilar atelectasis. 3. Aortic atherosclerosis. Electronically Signed   By: Ilona Sorrel M.D.   On: 06/20/2016 18:50   ASSESSMENT AND PLAN:   Closed left hip fracture S/p ORIF POD3, pain control, DVT prophylaxis and PT.  * Acute respiratory failure with hypoxia, possible due to aspiration pneumonia and underlying COPD. Continue oxygen [place] 4 L, NEB prn. Mucinex and Robitussin when necessary.  * Aspiration pneumonia/pulmonary edema on the right side. Start Zosyn pharmacy to dose, follow-up  CBC. Started Lasix IV twice a day  Type 2 DM with CKD stage 2 and hypertension ([place]) - sliding  scale insulin with corresponding glucose checks   COPD, severe ([place]) - continue home inhalers, stable.   Anxiety disorder - home dose anxiolytics   Chronic diastolic CHF (congestive heart failure). Started on Lasix IV twice a day.    Paroxysmal atrial fibrillation (HCC) - continue home meds   Adult hypothyroidism - home dose thyroid replacement  Symptomatic anemia, acute on chronic disease, due to acute blood lose.  Hb down to 7.1. S/p one units PRBC transfusion, Hb up to 8.7. Follow-up hemoglobin in am.  PT evaluation suggest skilled nursing facility placement. All the records are reviewed and case discussed with Care Management/Social Worker. Management plans discussed with the patient, her son and they are in agreement.  CODE STATUS: Full code  TOTAL TIME TAKING CARE OF THIS PATIENT: 43 minutes.   More than 50% of the time was spent in counseling/coordination of care: YES  POSSIBLE D/C IN 2 DAYS, DEPENDING ON CLINICAL CONDITION.   Demetrios Loll M.D on 06/21/2016 at 2:38 PM  Between 7am to 6pm - Pager - [phone]  After 6pm go to [email or web address] - Proofreader  [facility]  Office  [phone]  CC: Primary care physician; [place] Liter, DO  Note: This dictation was prepared with Dragon dictation along with smaller phrase technology. Any transcriptional errors that result from this process are unintentional.

## 2016-06-21 NOTE — Progress Notes (Signed)
Physical Therapy Treatment Patient Details Name: Theresa Vasquez MRN: [id number] DOB: 02-14-1931 Today's Date: 06/21/2016    History of Present Illness Pt is an 82 y.o. female s/p fall with L hip pain and found to have displaced L intertrochanteric femur fx.  Pt s/p L IMN 06/18/16.  PMH includes dementia, a-fib with RVR, CHF, COPD, DM, htn.    PT Comments    Pt agrees to session.  Participated in exercises as described below.  Bed mobility remains max a +2.  Once sitting pt able to sit for 20 minutes before fatigue with good sitting balance.  She tolerated activity better today with less discomfort and more engagement but she remains unable to stand despite max a x 2.  For pt and staff safety a lift transfer is more appropriate at this time.     Follow Up Recommendations   SNF     Equipment Recommendations       Recommendations for Other Services       Precautions / Restrictions Precautions Precautions: Fall Restrictions Weight Bearing Restrictions: Yes LLE Weight Bearing: Weight bearing as tolerated    Mobility  Bed Mobility Overal bed mobility: +2 for physical assistance Bed Mobility: Supine to Sit;Sit to Supine     Supine to sit: Max assist;+2 for physical assistance Sit to supine: Max assist;+2 for physical assistance   General bed mobility comments: less resistance today but little assist given with bed mobility  Transfers Overall transfer level: Needs assistance Equipment used: Rolling walker (2 wheeled)   Sit to Stand: Max assist;+2 safety/equipment         General transfer comment: Pt attemtping to stand today but unable to lift bottom from bed at all with +2 max assist.  Lift is more appropriate at this time for pt and staff safety.  Ambulation/Gait             General Gait Details: Not appropriate at this time   Stairs            Wheelchair Mobility    Modified Rankin (Stroke Patients Only)       Balance Overall balance  assessment: Needs assistance Sitting-balance support: Feet supported Sitting balance-Leahy Scale: Good Sitting balance - Comments: sat for 20 minutes today without LOB   Standing balance support: Bilateral upper extremity supported Standing balance-Leahy Scale: Zero                      Cognition Arousal/Alertness: Awake/alert Behavior During Therapy: WFL for tasks assessed/performed Overall Cognitive Status: History of cognitive impairments - at baseline       Memory: Decreased recall of precautions              Exercises Total Joint Exercises Ankle Circles/Pumps: AAROM;Strengthening;Both;10 reps;Supine Quad Sets: AAROM;Strengthening;Both;10 reps;Supine Towel Squeeze: AROM;Strengthening;Both;10 reps;Supine Short Arc Quad: AAROM;Strengthening;Left;10 reps;Supine Heel Slides: AAROM;Strengthening;Left;10 reps;Supine Hip ABduction/ADduction: AAROM;Strengthening;Left;10 reps;Supine    General Comments        Pertinent Vitals/Pain Pain Assessment: Faces Faces Pain Scale: Hurts even more Pain Location: L  hip Pain Descriptors / Indicators: Operative site guarding;Sore Pain Intervention(s): Limited activity within patient's tolerance    Home Living                      Prior Function            PT Goals (current goals can now be found in the care plan section) Progress towards PT goals: Progressing toward goals  Frequency    BID      PT Plan Current plan remains appropriate    Co-evaluation             End of Session Equipment Utilized During Treatment: Gait belt;Oxygen Activity Tolerance: Patient limited by fatigue;Patient limited by pain Patient left: in bed;with call bell/phone within reach;with bed alarm set;with nursing/sitter in room;with family/visitor present;with SCD's reapplied     Time: [id number] PT Time Calculation (min) (ACUTE ONLY): 24 min  Charges:  $Therapeutic Exercise: 8-22 mins $Therapeutic Activity: 8-22  mins                    G Codes:      Chesley Noon 2016-07-17, 1:15 PM

## 2016-06-22 LAB — CBC
HCT: 23.6 % — ABNORMAL LOW (ref 35.0–47.0)
Hemoglobin: 7.9 g/dL — ABNORMAL LOW (ref 12.0–16.0)
MCH: 29.6 pg (ref 26.0–34.0)
MCHC: 33.3 g/dL (ref 32.0–36.0)
MCV: 88.8 fL (ref 80.0–100.0)
PLATELETS: 362 10*3/uL (ref 150–440)
RBC: 2.66 MIL/uL — AB (ref 3.80–5.20)
RDW: 14.4 % (ref 11.5–14.5)
WBC: 9.6 10*3/uL (ref 3.6–11.0)

## 2016-06-22 LAB — GLUCOSE, CAPILLARY
GLUCOSE-CAPILLARY: 145 mg/dL — AB (ref 65–99)
GLUCOSE-CAPILLARY: 217 mg/dL — AB (ref 65–99)
GLUCOSE-CAPILLARY: 231 mg/dL — AB (ref 65–99)
GLUCOSE-CAPILLARY: 221 mg/dL — AB (ref 65–99)

## 2016-06-22 MED ORDER — FUROSEMIDE 20 MG PO TABS
20.0000 mg | ORAL_TABLET | Freq: Two times a day (BID) | ORAL | Status: DC
  Administered 2016-06-22 – 2016-06-23 (×2): 20 mg via ORAL
  Filled 2016-06-22 (×2): qty 1

## 2016-06-22 NOTE — Progress Notes (Signed)
Physical Therapy Treatment Patient Details Name: Theresa Vasquez MRN: [id number] DOB: [birth year] Today's Date: 06/22/2016    History of Present Illness Pt is an 82 y.o. female s/p fall with L hip pain and found to have displaced L intertrochanteric femur fx.  Pt s/p L IMN 06/18/16.  PMH includes dementia, a-fib with RVR, CHF, COPD, DM, htn.    PT Comments    Pt presented in bed with sheets wet 2/2 water spill.  Continues to require maxAx2 supine to/from sit at EOB. Pt demonstrated fair tolerance to sit to/from stand transfers 2/2 increased pain with weight bearing. Pt continues to require max tactile cues to achieve full erect posture and has difficulty maintaining. Difficulty initiating LLE for gait.  She would continue to benefit from skilled PT to increase LLE strength to improve tolerance to all functional mobility.   Follow Up Recommendations        Equipment Recommendations       Recommendations for Other Services       Precautions / Restrictions Precautions Precautions: Fall Restrictions Weight Bearing Restrictions: Yes LLE Weight Bearing: Weight bearing as tolerated    Mobility  Bed Mobility Overal bed mobility: +2 for physical assistance Bed Mobility: Supine to Sit;Sit to Supine     Supine to sit: Max assist;+2 for physical assistance Sit to supine: Max assist;+2 for physical assistance   General bed mobility comments: Max cues for sequencing  Transfers Overall transfer level: Needs assistance Equipment used: Rolling walker (2 wheeled)   Sit to Stand: Max assist;+2 physical assistance         General transfer comment: Pt required manual placement for shifting hips forward. Unable to acheive full standing without manual placement   Ambulation/Gait                 Stairs            Wheelchair Mobility    Modified Rankin (Stroke Patients Only)       Balance Overall balance assessment: Needs assistance Sitting-balance support: Feet  supported Sitting balance-Leahy Scale: Good     Standing balance support: Bilateral upper extremity supported Standing balance-Leahy Scale: Poor Standing balance comment: Balance improved to fair once acheiving erect posture                     Cognition Arousal/Alertness: Awake/alert Behavior During Therapy: WFL for tasks assessed/performed Overall Cognitive Status: History of cognitive impairments - at baseline                      Exercises Total Joint Exercises Ankle Circles/Pumps: AROM;Both;10 reps;Supine Heel Slides: AROM;Strengthening;Left;10 reps;Supine Straight Leg Raises: AAROM;Left;10 reps;Supine    General Comments        Pertinent Vitals/Pain Pain Assessment: Faces Faces Pain Scale: Hurts even more Pain Descriptors / Indicators: Operative site guarding Pain Intervention(s): Limited activity within patient's tolerance;Monitored during session    Home Living                      Prior Function            PT Goals (current goals can now be found in the care plan section) Progress towards PT goals: Progressing toward goals    Frequency    BID      PT Plan Current plan remains appropriate    Co-evaluation             End of Session Equipment Utilized During Treatment: Gait  belt;Oxygen Activity Tolerance: Patient limited by pain Patient left: in bed;with call bell/phone within reach;with bed alarm set;with family/visitor present     Time: 1450-1520 PT Time Calculation (min) (ACUTE ONLY): 30 min  Charges:  $Therapeutic Activity: 23-37 mins                    G Codes:      Fleming Prill  Theresa Vasquez, PTA 06/22/2016, 3:33 PM

## 2016-06-22 NOTE — Progress Notes (Signed)
Nutrition Brief Note  Patient continues to eat 100% No acute complaints.  Had Pancakes, Berniece Salines, Eggs for breakfast. Labs and medications reviewed.  Satira Anis. Bookert Guzzi, MS, RD LDN Inpatient Clinical Dietitian Pager [phone]

## 2016-06-22 NOTE — Progress Notes (Signed)
Subjective: 4 Days Post-Op Procedure(s) (LRB): INTRAMEDULLARY (IM) NAIL INTERTROCHANTRIC (Left) Patient reports pain as mild.   Patient is with early pnuemonia to right upper parahilar Continue with physical  therapy today.  Plan is to go Rehab after hospital stay. no nausea and no vomiting Patient denies any chest pains or shortness of breath. Not coughing up anything Objective: Vital signs in last 24 hours: Temp:  [97.8 F (36.6 C)-98.6 F (37 C)] 97.8 F (36.6 C) (12/04 0327) Pulse Rate:  [66-78] 66 (12/04 0327) Resp:  [18] 18 (12/04 0327) BP: (120-142)/(44-52) 131/48 (12/04 0327) SpO2:  [90 %-97 %] 96 % (12/04 0525) well approximated incision Heels are non tender and elevated off the bed using rolled towels Intake/Output from previous day: 12/03 0701 - 12/04 0700 In: 240 [P.O.:240] Out: -  Intake/Output this shift: No intake/output data recorded.   Recent Labs  06/20/16 0410 06/21/16 0436 06/22/16 0345  HGB 7.1* 8.7* 7.9*    Recent Labs  06/21/16 0436 06/22/16 0345  WBC 12.8* 9.6  RBC 2.97* 2.66*  HCT 26.2* 23.6*  PLT 408 362    Recent Labs  06/20/16 0410 06/21/16 0436  NA 137 136  K 3.7 4.1  CL 101 100*  CO2 29 30  BUN 15 14  CREATININE 0.83 0.78  GLUCOSE 184* 177*  CALCIUM 7.8* 7.9*   No results for input(s): LABPT, INR in the last 72 hours.  EXAM General - Patient is Alert and Confused "what happen to me". Pt sounds must better today. Did not hear any congestion today. Lungs sound good. Extremity - Neurologically intact Neurovascular intact Sensation intact distally Intact pulses distally No cellulitis present Compartment soft Dressing - moderate drainage Motor Function - intact, moving foot and toes well on exam.    Past Medical History:  Diagnosis Date  . Adenomatous polyps   . Age-related osteoporosis without current pathological fracture   . Anemia   . Anxiety   . Atherosclerosis of aorta ([place])   . Atrial fibrillation  with RVR ([place])   . Barrett's esophagus without dysplasia   . CHF (congestive heart failure) ([place])   . Colitis   . COPD (chronic obstructive pulmonary disease) ([place])   . Dementia   . Depression   . Diabetes mellitus type II, uncontrolled ([place])   . Diverticulitis   . Diverticulosis of intestine without perforation or abscess without bleeding   . Edema   . Essential hemorrhagic thrombocythemia ([place])   . Gastrointestinal hemorrhage   . High anion gap metabolic acidosis   . Hyperlipidemia   . Hypertension   . Hypertensive chronic kidney disease with stage 1 through stage 4 chronic kidney disease, or unspecified chronic kidney disease   . Hypothyroidism   . Hypoxemia   . Iron deficiency anemia   . Lactic acidosis   . Left bundle branch block   . Nonrheumatic mitral valve insufficiency   . Osteoarthritis   . Other psychoactive substance dependence, in remission ([place])   . Other specified symptoms and signs involving the circulatory and respiratory systems   . Paroxysmal atrial fibrillation (HCC)    s/p cardioversion  . Personal history of peptic ulcer disease   . Pulmonary edema   . PVD (peripheral vascular disease) ([place]) 05/14/2015  . Recurrent pneumonia    history of  . Respiratory failure with hypoxia and hypercapnia ([place])   . Type 2 diabetes mellitus with diabetic chronic kidney disease ([place])   . Type 2 diabetes mellitus with diabetic polyneuropathy ([place])   .  Vascular dementia without behavioral disturbance   . Vascular disorder of intestine (HCC)     Assessment/Plan: 4 Days Post-Op Procedure(s) (LRB): INTRAMEDULLARY (IM) NAIL INTERTROCHANTRIC (Left) Principal Problem:   Closed left hip fracture (HCC) Active Problems:   Type 2 DM with CKD stage 2 and hypertension (HCC)   COPD, severe (HCC)   Anxiety disorder   Chronic diastolic CHF (congestive heart failure) (HCC)   Paroxysmal atrial fibrillation (HCC)   Adult hypothyroidism  Estimated body mass index is 32.44 kg/m  as calculated from the following:   Height as of this encounter: 5\' 6"  (1.676 m).   Weight as of this encounter: 91.2 kg (201 lb). Up with therapy  Labs: reviewed. Wbc down. hgb 7.9 DVT Prophylaxis - Lovenox, Foot Pumps and TED hose Weight-Bearing as tolerated to right leg D/C O2 and Pulse OX and try on Room Air Continue Lovenox 40 mg daily for 14 days at discharge Change dressing as needed Return to clinic 2 weeks for staple removal and reevaluation Pt now on [place]. [place] 06/22/2016, 7:13 AM

## 2016-06-22 NOTE — Progress Notes (Addendum)
Patient did not discharge over the weekend and [facility] authorization has expired. Per MD patient may be ready for D/C tomorrow. Updated clinicals were sent to [facility]. CSW also called [employer] and made them aware of above. Patient is aware of above and her friend/ roommate was at bedside and is also aware of above. [facility] liaison is aware of above. Clinical Social Worker (CSW) will continue to follow and assist as needed.   A new [employer] authorization was received today. Auth # [id number]. Authorization is good for 48 hours.   [employer], LCSW [phone]

## 2016-06-22 NOTE — Progress Notes (Signed)
OT Cancellation Note  Patient Details Name: Theresa Vasquez MRN: [id number] DOB: 1930/12/04   Cancelled Treatment:    Reason Eval/Treat Not Completed: Fatigue/lethargy limiting ability to participate Pt declined due to being too fatigued after PT session.  Will resume therapy again tomorrow as tolerated.  [name], OTR/L ascom [phone] 06/22/16, 4:58 PM

## 2016-06-22 NOTE — Progress Notes (Signed)
[place] [facility] [place] NAME: Theresa Vasquez    MR#:  [id number]  DATE OF BIRTH:  [birth year]  SUBJECTIVE:  CHIEF COMPLAINT:   Chief Complaint  Patient presents with  . Hip Pain  . Fall   Better cough and shortness of breath, unable to cough out sputum, on O2 [place] 3 L. Drowsy. REVIEW OF SYSTEMS:  Review of Systems  Constitutional: Positive for malaise/fatigue. Negative for chills and fever.  HENT: Negative for congestion.   Eyes: Negative for blurred vision and double vision.  Respiratory: Positive for cough and shortness of breath. Negative for hemoptysis, sputum production, wheezing and stridor.   Cardiovascular: Negative for chest pain and leg swelling.  Gastrointestinal: Negative for abdominal pain, blood in stool, diarrhea, melena, nausea and vomiting.  Genitourinary: Negative for dysuria.  Musculoskeletal: Negative for back pain and joint pain.  Neurological: Positive for weakness. Negative for dizziness, focal weakness and loss of consciousness.  Psychiatric/Behavioral: Negative for depression. The patient is not nervous/anxious.     DRUG ALLERGIES:  No Known Allergies VITALS:  Blood pressure (!) 141/54, pulse 70, temperature 97.8 F (36.6 C), temperature source Oral, resp. rate 16, height 5\' 6"  (1.676 m), weight 201 lb (91.2 kg), SpO2 93 %. PHYSICAL EXAMINATION:  Physical Exam  Constitutional: She is oriented to person, place, and time and well-developed, well-nourished, and in no distress.  HENT:  Head: Normocephalic.  Mouth/Throat: Oropharynx is clear and moist.  Eyes: Conjunctivae and EOM are normal.  Neck: Normal range of motion. Neck supple. No JVD present. No tracheal deviation present.  Cardiovascular: Normal rate, regular rhythm and normal heart sounds.  Exam reveals no gallop.   No murmur heard. Pulmonary/Chest: Effort normal and breath sounds normal. No respiratory distress. She has no wheezes. She has no rales.    Diminished lung sounds. Mild crackles in left lung.  Abdominal: Soft. She exhibits no distension. There is no tenderness.  Musculoskeletal: She exhibits no edema or tenderness.  Neurological: She is alert and oriented to person, place, and time. No cranial nerve deficit.  Skin: No rash noted. No erythema.  Bruises in arms and legs.  Psychiatric: Affect and judgment normal.   LABORATORY PANEL:   CBC  Recent Labs Lab 06/22/16 0345  WBC 9.6  HGB 7.9*  HCT 23.6*  PLT 362   ------------------------------------------------------------------------------------------------------------------ Chemistries   Recent Labs Lab 06/21/16 0436  NA 136  K 4.1  CL 100*  CO2 30  GLUCOSE 177*  BUN 14  CREATININE 0.78  CALCIUM 7.9*   RADIOLOGY:  No results found. ASSESSMENT AND PLAN:   Closed left hip fracture S/p ORIF POD4, pain control, DVT prophylaxis and PT.  * Acute respiratory failure with hypoxia, possible due to aspiration pneumonia and underlying COPD. On oxygen [place] 3 L, try to wean down O2,  NEB prn. Mucinex and Robitussin when necessary.  * Aspiration pneumonia/pulmonary edema on the right side. Started Zosyn pharmacy to dose, follow-up CBC. Started Lasix twice a day  Type 2 DM with CKD stage 2 and hypertension (HCC) - sliding scale insulin with corresponding glucose checks   COPD, severe ([place]) - continue home inhalers, stable.   Anxiety disorder - home dose anxiolytics   Chronic diastolic CHF (congestive heart failure). Started on Lasix IV twice a day, change to po.    Paroxysmal atrial fibrillation (HCC) - continue home meds   Adult hypothyroidism - home dose thyroid replacement  Symptomatic anemia, acute on chronic disease, due  to acute blood lose.  Hb down to 7.1. S/p one units PRBC transfusion, Hb up to 8.7. But down to 7.9.  Follow-up hemoglobin in am. No melena or bloody stool.  PT evaluation suggest skilled nursing facility placement. All the records are  reviewed and case discussed with Care Management/Social Worker. Management plans discussed with the patient, her caregiver and they are in agreement.  CODE STATUS: Full code  TOTAL TIME TAKING CARE OF THIS PATIENT: 36 minutes.   More than 50% of the time was spent in counseling/coordination of care: YES  POSSIBLE D/C IN 1-2 DAYS, DEPENDING ON CLINICAL CONDITION.   Demetrios Loll M.D on 06/22/2016 at 2:03 PM  Between 7am to 6pm - Pager - [phone]  After 6pm go to [email or web address] - Proofreader  [facility]  Office  [phone]  CC: Primary care physician; Park Liter, DO  Note: This dictation was prepared with Dragon dictation along with smaller phrase technology. Any transcriptional errors that result from this process are unintentional.

## 2016-06-22 NOTE — Progress Notes (Signed)
Physical Therapy Treatment Patient Details Name: Theresa Vasquez MRN: [id number] DOB: Oct 06, 1930 Today's Date: 06/22/2016    History of Present Illness Pt is an 82 y.o. female s/p fall with L hip pain and found to have displaced L intertrochanteric femur fx.  Pt s/p L IMN 06/18/16.  PMH includes dementia, a-fib with RVR, CHF, COPD, DM, htn.    PT Comments    Pt continues to demonstrate fair progression with mobility. Pt presented in bed agreeable to therapy. Continues to require maxA x 2 for bed mobility.  Performed sit to stand with maxA x 2 with bed elevated, pt was able to clear bed and stand up to 30 seconds with multimodal cues for erect posture and increased glute activation. Pt required mod cues for PLB as would desat to 86% on 4L however pt also noted to be holding breath 2/2 pain. Pt returned to bed after standing trials and would continue to benefit from skilled PT to improve functional mobility tolerance.   Follow Up Recommendations        Equipment Recommendations       Recommendations for Other Services       Precautions / Restrictions Precautions Precautions: Fall Restrictions Weight Bearing Restrictions: Yes LLE Weight Bearing: Weight bearing as tolerated    Mobility  Bed Mobility Overal bed mobility: +2 for physical assistance Bed Mobility: Supine to Sit;Sit to Supine     Supine to sit: Max assist;+2 for physical assistance Sit to supine: Max assist;+2 for physical assistance   General bed mobility comments: Pt able to initiate L hip abd with PTA support for facilitation of transfer  Transfers Overall transfer level: Needs assistance Equipment used: Rolling walker (2 wheeled)   Sit to Stand: Max assist;+2 physical assistance         General transfer comment: With tactile cues pt able to clear bed and stand in full erect posotion for several seconds.   Ambulation/Gait                 Stairs            Wheelchair Mobility     Modified Rankin (Stroke Patients Only)       Balance Overall balance assessment: Needs assistance Sitting-balance support: Feet supported Sitting balance-Leahy Scale: Good     Standing balance support: Bilateral upper extremity supported Standing balance-Leahy Scale: Poor                      Cognition Arousal/Alertness: Awake/alert Behavior During Therapy: WFL for tasks assessed/performed Overall Cognitive Status: History of cognitive impairments - at baseline                      Exercises Total Joint Exercises Ankle Circles/Pumps: AAROM;Strengthening;Both;15 reps Long Arc Quad: AAROM;Left;5 reps;Seated    General Comments        Pertinent Vitals/Pain Pain Assessment: Faces Faces Pain Scale: Hurts whole lot Pain Location: L hip Pain Descriptors / Indicators: Operative site guarding Pain Intervention(s): Limited activity within patient's tolerance;Monitored during session    Home Living                      Prior Function            PT Goals (current goals can now be found in the care plan section)      Frequency    BID      PT Plan Current plan remains appropriate  Co-evaluation             End of Session Equipment Utilized During Treatment: Gait belt;Oxygen Activity Tolerance: Patient limited by lethargy Patient left: in bed;with call bell/phone within reach;with bed alarm set;with family/visitor present     Time: 1000-1025 PT Time Calculation (min) (ACUTE ONLY): 25 min  Charges:  $Therapeutic Activity: 23-37 mins                    G Codes:      Jerrol Helmers  Daritza Brees, PTA 06/22/2016, 10:37 AM

## 2016-06-22 NOTE — Progress Notes (Signed)
Inpatient Diabetes Program Recommendations  AACE/ADA: New Consensus Statement on Inpatient Glycemic Control (2015)  Target Ranges:  Prepandial:   less than 140 mg/dL      [place] postprandial:   less than 180 mg/dL (1-2 hours)      Critically ill patients:  140 - 180 mg/dL   Results for Theresa Vasquez, Theresa Vasquez (MRN [id number]) as of 06/22/2016 10:03  Ref. Range 06/21/2016 07:31 06/21/2016 11:35 06/21/2016 17:32 06/21/2016 21:34 06/22/2016 07:42  Glucose-Capillary Latest Ref Range: 65 - 99 mg/dL 181 (H) 208 (H) 262 (H) 234 (H) 145 (H)   Review of Glycemic Control  Current orders for Inpatient glycemic control: Novolog 0-9 units TID with meals, Novolog 0-5 units QHS  Inpatient Diabetes Program Recommendations: Insulin - Meal Coverage: Post prandial glucose is consistently elevated. If steroids are continued, please consider ordering Novolog 3 units TID with meals for meal coverage if patient eats at least 50% of meal.  Thanks, Barnie Alderman, RN, MSN, CDE Diabetes Coordinator Inpatient Diabetes Program [phone] (Team Pager from 8am to [place])

## 2016-06-23 LAB — HEMOGLOBIN: HEMOGLOBIN: 8.7 g/dL — AB (ref 12.0–16.0)

## 2016-06-23 LAB — GLUCOSE, CAPILLARY
GLUCOSE-CAPILLARY: 134 mg/dL — AB (ref 65–99)
GLUCOSE-CAPILLARY: 171 mg/dL — AB (ref 65–99)
GLUCOSE-CAPILLARY: 185 mg/dL — AB (ref 65–99)
Glucose-Capillary: 155 mg/dL — ABNORMAL HIGH (ref 65–99)

## 2016-06-23 MED ORDER — OXYCODONE HCL 5 MG PO TABS: 5.0000 mg | ORAL_TABLET | Freq: Four times a day (QID) | ORAL | 0 refills | Status: DC | PRN

## 2016-06-23 MED ORDER — GUAIFENESIN ER 600 MG PO TB12: 600.0000 mg | ORAL_TABLET | Freq: Two times a day (BID) | ORAL | 0 refills | Status: DC

## 2016-06-23 MED ORDER — AMOXICILLIN-POT CLAVULANATE 875-125 MG PO TABS: 1.0000 | ORAL_TABLET | Freq: Two times a day (BID) | ORAL | 0 refills | Status: DC

## 2016-06-23 MED ORDER — SENNOSIDES-DOCUSATE SODIUM 8.6-50 MG PO TABS: 1.0000 | ORAL_TABLET | Freq: Every evening | ORAL | Status: AC | PRN

## 2016-06-23 MED ORDER — GUAIFENESIN-DM 100-10 MG/5ML PO SYRP: 5.0000 mL | ORAL_SOLUTION | ORAL | 0 refills | Status: DC | PRN

## 2016-06-23 MED ORDER — ENOXAPARIN SODIUM 40 MG/0.4ML ~~LOC~~ SOLN: 40.0000 mg | SUBCUTANEOUS | Status: DC

## 2016-06-23 NOTE — Progress Notes (Signed)
Subjective: 5 Days Post-Op Procedure(s) (LRB): INTRAMEDULLARY (IM) NAIL INTERTROCHANTRIC (Left) Patient reports pain as mild.   Patient is doing well with out complaints. Denies any CP, SOB, ABD pain, N/V. States breating is well. Continue with physical  therapy today.  Plan is to go Rehab after hospital stay.  Objective: Vital signs in last 24 hours: Temp:  [97.8 F (36.6 C)-98.5 F (36.9 C)] 98.5 F (36.9 C) (12/05 0732) Pulse Rate:  [63-70] 65 (12/05 0732) Resp:  [16-18] 16 (12/05 0732) BP: (114-150)/(42-68) 150/48 (12/05 0732) SpO2:  [94 %-100 %] 94 % (12/05 0732)  Intake/Output from previous day: 12/04 0701 - 12/05 0700 In: 600 [P.O.:600] Out: -  Intake/Output this shift: No intake/output data recorded.   Recent Labs  06/21/16 0436 06/22/16 0345  HGB 8.7* 7.9*    Recent Labs  06/21/16 0436 06/22/16 0345  WBC 12.8* 9.6  RBC 2.97* 2.66*  HCT 26.2* 23.6*  PLT 408 362    Recent Labs  06/21/16 0436  NA 136  K 4.1  CL 100*  CO2 30  BUN 14  CREATININE 0.78  GLUCOSE 177*  CALCIUM 7.9*   No results for input(s): LABPT, INR in the last 72 hours.  EXAM General - Patient is Alert and Appropriate . No signs of confusion. Extremity - Neurologically intact Neurovascular intact Sensation intact distally Intact pulses distally No cellulitis present Compartment soft Dressing - moderate drainage serous drainage Motor Function - intact, moving toes well on exam.    Past Medical History:  Diagnosis Date  . Adenomatous polyps   . Age-related osteoporosis without current pathological fracture   . Anemia   . Anxiety   . Atherosclerosis of aorta ([place])   . Atrial fibrillation with RVR ([place])   . Barrett's esophagus without dysplasia   . CHF (congestive heart failure) ([place])   . Colitis   . COPD (chronic obstructive pulmonary disease) ([facility])   . Dementia   . Depression   . Diabetes mellitus type II, uncontrolled ([place])   . Diverticulitis   .  Diverticulosis of intestine without perforation or abscess without bleeding   . Edema   . Essential hemorrhagic thrombocythemia ([place])   . Gastrointestinal hemorrhage   . High anion gap metabolic acidosis   . Hyperlipidemia   . Hypertension   . Hypertensive chronic kidney disease with stage 1 through stage 4 chronic kidney disease, or unspecified chronic kidney disease   . Hypothyroidism   . Hypoxemia   . Iron deficiency anemia   . Lactic acidosis   . Left bundle branch block   . Nonrheumatic mitral valve insufficiency   . Osteoarthritis   . Other psychoactive substance dependence, in remission ([place])   . Other specified symptoms and signs involving the circulatory and respiratory systems   . Paroxysmal atrial fibrillation (HCC)    s/p cardioversion  . Personal history of peptic ulcer disease   . Pulmonary edema   . PVD (peripheral vascular disease) ([place]) 05/14/2015  . Recurrent pneumonia    history of  . Respiratory failure with hypoxia and hypercapnia ([place])   . Type 2 diabetes mellitus with diabetic chronic kidney disease ([place])   . Type 2 diabetes mellitus with diabetic polyneuropathy ([place])   . Vascular dementia without behavioral disturbance   . Vascular disorder of intestine (HCC)     Assessment/Plan: 5 Days Post-Op Procedure(s) (LRB): INTRAMEDULLARY (IM) NAIL INTERTROCHANTRIC (Left) Principal Problem:   Closed left hip fracture (HCC) Active Problems:   Type 2 DM with  CKD stage 2 and hypertension (HCC)   COPD, severe (HCC)   Anxiety disorder   Chronic diastolic CHF (congestive heart failure) (HCC)   Paroxysmal atrial fibrillation (HCC)   Adult hypothyroidism  Estimated body mass index is 32.44 kg/m as calculated from the following:   Height as of this encounter: 5\' 6"  (1.676 m).   Weight as of this encounter: 91.2 kg (201 lb).    DVT Prophylaxis - Lovenox, Foot Pumps and TED hose Weight-Bearing as tolerated to right leg  Continue with PT this am Labs pending  this am Dressing changed prior to discharge Continue Lovenox 40 mg daily for 14 days at discharge Return to clinic 2 weeks for staple removal and reevaluation   Duanne Guess PA-C [place] 06/23/2016, 8:29 AM

## 2016-06-23 NOTE — Progress Notes (Signed)
Physical Therapy Treatment Patient Details Name: Theresa Vasquez MRN: [id number] DOB: May 05, 1931 Today's Date: 06/23/2016    History of Present Illness Pt is an 82 y.o. female s/p fall with L hip pain and found to have displaced L intertrochanteric femur fx.  Pt s/p L IMN 06/18/16.  PMH includes dementia, a-fib with RVR, CHF, COPD, DM, htn.    PT Comments    Participated in exercises as described below.  Bed mobility remains max a x 2/dependant for all activities.  She was able to stand 60 seconds x 2 from raised bed with mod/max a x 2.  Standing quality varies and required verbal and tactile cues to correct posture.  She put good effort into standing today.  Anticipate discharge to SNF today.   Follow Up Recommendations  SNF     Equipment Recommendations       Recommendations for Other Services       Precautions / Restrictions Precautions Precautions: Fall Restrictions Weight Bearing Restrictions: Yes LLE Weight Bearing: Weight bearing as tolerated    Mobility  Bed Mobility Overal bed mobility: +2 for physical assistance Bed Mobility: Supine to Sit;Sit to Supine     Supine to sit: Max assist;+2 for physical assistance Sit to supine: Max assist;+2 for physical assistance   General bed mobility comments: Max cues for sequencing  Transfers Overall transfer level: Needs assistance Equipment used: Rolling walker (2 wheeled) Transfers: Sit to/from Stand Sit to Stand: Mod assist;Max assist;+2 physical assistance         General transfer comment: max vc's, raised bed, less assistance required daily to stand but bed mobility remains challenging  Ambulation/Gait             General Gait Details: Not appropriate at this time   Stairs            Wheelchair Mobility    Modified Rankin (Stroke Patients Only)       Balance Overall balance assessment: Needs assistance   Sitting balance-Leahy Scale: Good       Standing balance-Leahy Scale:  Poor Standing balance comment: standing posture varries during session, verbal and tactile cues to correct                    Cognition Arousal/Alertness: Awake/alert Behavior During Therapy: WFL for tasks assessed/performed Overall Cognitive Status: History of cognitive impairments - at baseline       Memory: Decreased recall of precautions              Exercises Total Joint Exercises Ankle Circles/Pumps: AROM;Both;10 reps;Supine Short Arc Quad: AAROM;Strengthening;Left;10 reps;Supine Heel Slides: AROM;Strengthening;Left;10 reps;Supine Hip ABduction/ADduction: AAROM;Strengthening;Left;10 reps;Supine Straight Leg Raises: AAROM;Left;10 reps;Supine    General Comments        Pertinent Vitals/Pain Pain Assessment: Faces Pain Score: 5  Pain Location: L hip Pain Descriptors / Indicators: Sore;Operative site guarding Pain Intervention(s): Limited activity within patient's tolerance    Home Living                      Prior Function            PT Goals (current goals can now be found in the care plan section) Progress towards PT goals: Progressing toward goals    Frequency    BID      PT Plan Current plan remains appropriate    Co-evaluation             End of Session Equipment Utilized During Treatment: Gait belt;Oxygen  Activity Tolerance: Patient tolerated treatment well;Patient limited by pain Patient left: in bed;with bed alarm set;with call bell/phone within reach;with family/visitor present     Time: [id number] PT Time Calculation (min) (ACUTE ONLY): 22 min  Charges:  $Therapeutic Activity: 8-22 mins                    G Codes:      Chesley Noon 07/10/2016, 10:37 AM

## 2016-06-23 NOTE — Progress Notes (Addendum)
On assessment patient is alert to self. Patient complaining of discomfort with movement. Patient on 2.5L O2. Patient has some stridor and a non productive cough. Patient has a honeycomb dressing on her left hip with moderate serosanguinous drainage that has been changed by RN. Patient has a small skin tear on her left arm that is wrapped in gauze.   RN Maudie Mercury given report at [employer]. All questions answered. EMS called for transport.    Deri Fuelling, RN

## 2016-06-23 NOTE — Care Management Important Message (Signed)
Important Message  Patient Details  Name: Theresa Vasquez MRN: [id number] Date of Birth: 1930-12-16   Medicare Important Message Given:  Yes    Jolly Mango, RN 06/23/2016, 8:55 AM

## 2016-06-23 NOTE — Discharge Summary (Signed)
[facility] [place] NAME: Peaches Vanderbeck    MR#:  [id number]  DATE OF BIRTH:  1930-10-20  DATE OF ADMISSION:  06/17/2016   ADMITTING PHYSICIAN: Lance Coon, MD  DATE OF DISCHARGE: 06/23/2016 PRIMARY CARE PHYSICIAN: Park Liter, DO   ADMISSION DIAGNOSIS:  Closed left hip fracture, initial encounter ([place]) [S72.002A] DISCHARGE DIAGNOSIS:  Principal Problem:   Closed left hip fracture ([place]) Active Problems:   Type 2 DM with CKD stage 2 and hypertension (HCC)   COPD, severe (HCC)   Anxiety disorder   Chronic diastolic CHF (congestive heart failure) (HCC)   Paroxysmal atrial fibrillation ([place])   Adult hypothyroidism Acute respiratory failure with hypoxia, possible due to aspiration pneumonia and underlying COPD SECONDARY DIAGNOSIS:   Past Medical History:  Diagnosis Date  . Adenomatous polyps   . Age-related osteoporosis without current pathological fracture   . Anemia   . Anxiety   . Atherosclerosis of aorta ([place])   . Atrial fibrillation with RVR ([place])   . Barrett's esophagus without dysplasia   . CHF (congestive heart failure) ([place])   . Colitis   . COPD (chronic obstructive pulmonary disease) ([place])   . Dementia   . Depression   . Diabetes mellitus type II, uncontrolled ([place])   . Diverticulitis   . Diverticulosis of intestine without perforation or abscess without bleeding   . Edema   . Essential hemorrhagic thrombocythemia ([place])   . Gastrointestinal hemorrhage   . High anion gap metabolic acidosis   . Hyperlipidemia   . Hypertension   . Hypertensive chronic kidney disease with stage 1 through stage 4 chronic kidney disease, or unspecified chronic kidney disease   . Hypothyroidism   . Hypoxemia   . Iron deficiency anemia   . Lactic acidosis   . Left bundle branch block   . Nonrheumatic mitral valve insufficiency   . Osteoarthritis   . Other psychoactive substance dependence, in remission ([place])   . Other specified  symptoms and signs involving the circulatory and respiratory systems   . Paroxysmal atrial fibrillation (HCC)    s/p cardioversion  . Personal history of peptic ulcer disease   . Pulmonary edema   . PVD (peripheral vascular disease) ([place]) 05/14/2015  . Recurrent pneumonia    history of  . Respiratory failure with hypoxia and hypercapnia ([place])   . Type 2 diabetes mellitus with diabetic chronic kidney disease ([place])   . Type 2 diabetes mellitus with diabetic polyneuropathy ([place])   . Vascular dementia without behavioral disturbance   . Vascular disorder of intestine [facility])    HOSPITAL COURSE:  Closed left hip fracture S/p ORIF POD5, pain control, DVT prophylaxis for 14 days and PT.  * Acute respiratory failure with hypoxia, possible due to aspiration pneumonia and underlying COPD. On oxygen [place] 2 L, try to wean down O2,  NEB prn. Mucinex and Robitussin when necessary.  * Aspiration pneumonia/pulmonary edema on the right side. Started Zosyn pharmacy to dose, follow-up CBC. Started Lasix twice a day  Type 2 DM with CKD stage 2 and hypertension (HCC) - sliding scale insulin with corresponding glucose checks COPD, severe ([place]) - continue home inhalers, stable. Anxiety disorder - home dose anxiolytics Chronic diastolic CHF (congestive heart failure). Started on Lasix IV twice a day, changed to po.  Paroxysmal atrial fibrillation (HCC) - continue home meds Adult hypothyroidism - home dose thyroid replacement  Symptomatic anemia, acute on chronic disease, due to acute blood lose.  Hb  down to 7.1. S/p one units PRBC transfusion, Hb up to 8.7. But down to 7.9.  Follow-up hemoglobin 8.7 today. No melena or bloody stool.  PT evaluation suggest skilled nursing facility placement.  DISCHARGE CONDITIONS:  Stable, discharge to SNF today. CONSULTS OBTAINED:  Treatment Team:  Dereck Leep, MD DRUG ALLERGIES:  No Known Allergies DISCHARGE MEDICATIONS:     Medication List      TAKE these medications   acetaminophen 500 MG tablet Commonly known as:  TYLENOL Take 500 mg by mouth every 4 (four) hours as needed.   albuterol (2.5 MG/3ML) 0.083% nebulizer solution Commonly known as:  PROVENTIL Take 3 mLs (2.5 mg total) by nebulization every 4 (four) hours as needed for wheezing or shortness of breath.   amiodarone 200 MG tablet Commonly known as:  PACERONE Take 100 mg by mouth daily.   amLODipine 5 MG tablet Commonly known as:  NORVASC Take 1 tablet (5 mg total) by mouth 2 (two) times daily.   amoxicillin-clavulanate 875-125 MG tablet Commonly known as:  AUGMENTIN Take 1 tablet by mouth 2 (two) times daily.   aspirin EC 81 MG tablet Take 81 mg by mouth daily.   divalproex 250 MG DR tablet Commonly known as:  DEPAKOTE Take 1 tablet (250 mg total) by mouth 3 (three) times daily.   donepezil 10 MG tablet Commonly known as:  ARICEPT Take 1 tablet (10 mg total) by mouth at bedtime.   enoxaparin 40 MG/0.4ML injection Commonly known as:  LOVENOX Inject 0.4 mLs (40 mg total) into the skin daily. Start taking on:  06/24/2016   ferrous sulfate 325 (65 FE) MG tablet Take 1 tablet (325 mg total) by mouth 2 (two) times daily with a meal. What changed:  when to take this   Fluticasone-Salmeterol 250-50 MCG/DOSE Aepb Commonly known as:  ADVAIR Inhale 1 puff into the lungs 2 (two) times daily.   furosemide 40 MG tablet Commonly known as:  LASIX Take 1 tablet (40 mg total) by mouth 2 (two) times daily.   gabapentin 400 MG capsule Commonly known as:  NEURONTIN Take 1 capsule (400 mg total) by mouth 2 (two) times daily.   guaiFENesin 600 MG 12 hr tablet Commonly known as:  MUCINEX Take 1 tablet (600 mg total) by mouth 2 (two) times daily.   guaiFENesin-dextromethorphan 100-10 MG/5ML syrup Commonly known as:  ROBITUSSIN DM Take 5 mLs by mouth every 4 (four) hours as needed for cough.   levothyroxine 150 MCG tablet Commonly known as:  SYNTHROID,  LEVOTHROID TAKE 1 TABLET BY MOUTH DAILY BEFORE BREAKFAST   lisinopril 20 MG tablet Commonly known as:  PRINIVIL,ZESTRIL Take 1 tablet (20 mg total) by mouth 2 (two) times daily.   Melatonin 3 MG Tabs Take 1 tablet (3 mg total) by mouth at bedtime.   metFORMIN 500 MG (MOD) 24 hr tablet Commonly known as:  GLUMETZA Take 1 tablet (500 mg total) by mouth daily with breakfast.   metFORMIN 500 MG tablet Commonly known as:  GLUCOPHAGE TAKE ONE TABLET EVERY DAY WITH BREAKFAST   metoprolol 50 MG tablet Commonly known as:  LOPRESSOR Take 0.5 tablets (25 mg total) by mouth 2 (two) times daily.   MICROLET LANCETS Misc TEST BLOOD GLUCOSE LEVEL ONCE DAILY   nitrofurantoin (macrocrystal-monohydrate) 100 MG capsule Commonly known as:  MACROBID Take 1 capsule (100 mg total) by mouth 2 (two) times daily.   oxyCODONE 5 MG immediate release tablet Commonly known as:  Oxy IR/ROXICODONE Take 1 tablet (  5 mg total) by mouth every 6 (six) hours as needed for breakthrough pain ((for MODERATE breakthrough pain)).   pantoprazole 40 MG tablet Commonly known as:  PROTONIX Take 1 tablet (40 mg total) by mouth 2 (two) times daily. What changed:  when to take this   QUEtiapine 25 MG tablet Commonly known as:  SEROQUEL Take 1 tablet (25 mg total) by mouth at bedtime.   senna-docusate 8.6-50 MG tablet Commonly known as:  Senokot-S Take 1 tablet by mouth at bedtime as needed for mild constipation.   tiotropium 18 MCG inhalation capsule Commonly known as:  SPIRIVA Place 1 capsule (18 mcg total) into inhaler and inhale daily.   venlafaxine XR 37.5 MG 24 hr capsule Commonly known as:  EFFEXOR XR Take 1 capsule (37.5 mg total) by mouth daily with breakfast.   Vitamin D-3 1000 units Caps Take 1 capsule (1,000 Units total) by mouth daily.        DISCHARGE INSTRUCTIONS:  See AVS. OXYGEN:  Home Oxygen: [place] 2L.  If you experience worsening of your admission symptoms, develop shortness of breath,  life threatening emergency, suicidal or homicidal thoughts you must seek medical attention immediately by calling 911 or calling your MD immediately  if symptoms less severe.  You Must read complete instructions/literature along with all the possible adverse reactions/side effects for all the Medicines you take and that have been prescribed to you. Take any new Medicines after you have completely understood and accpet all the possible adverse reactions/side effects.   Please note  You were cared for by a hospitalist during your hospital stay. If you have any questions about your discharge medications or the care you received while you were in the hospital after you are discharged, you can call the unit and asked to speak with the hospitalist on call if the hospitalist that took care of you is not available. Once you are discharged, your primary care physician will handle any further medical issues. Please note that NO REFILLS for any discharge medications will be authorized once you are discharged, as it is imperative that you return to your primary care physician (or establish a relationship with a primary care physician if you do not have one) for your aftercare needs so that they can reassess your need for medications and monitor your lab values.    On the day of Discharge:  VITAL SIGNS:  Blood pressure (!) 150/48, pulse 65, temperature 98.5 F (36.9 C), temperature source Oral, resp. rate 16, height 5\' 6"  (1.676 m), weight 201 lb (91.2 kg), SpO2 94 %. PHYSICAL EXAMINATION:  GENERAL:  82 y.o.-year-old patient lying in the bed with no acute distress.  EYES: Pupils equal, round, reactive to light and accommodation. No scleral icterus. Extraocular muscles intact.  HEENT: Head atraumatic, normocephalic. Oropharynx and nasopharynx clear.  NECK:  Supple, no jugular venous distention. No thyroid enlargement, no tenderness.  LUNGS: Normal breath sounds bilaterally, no wheezing, rales,rhonchi or  crepitation. No use of accessory muscles of respiration.  CARDIOVASCULAR: S1, S2 normal. No murmurs, rubs, or gallops.  ABDOMEN: Soft, non-tender, non-distended. Bowel sounds present. No organomegaly or mass.  EXTREMITIES: No pedal edema, cyanosis, or clubbing.  NEUROLOGIC: Cranial nerves II through XII are intact. Muscle strength 4/5 in all extremities. Sensation intact. Gait not checked.  PSYCHIATRIC: The patient is alert and oriented x 3.  SKIN: No obvious rash, lesion, or ulcer.  DATA REVIEW:   CBC  Recent Labs Lab 06/22/16 0345 06/23/16 0817  WBC 9.6  --  HGB 7.9* 8.7*  HCT 23.6*  --   PLT 362  --     Chemistries   Recent Labs Lab 06/21/16 0436  NA 136  K 4.1  CL 100*  CO2 30  GLUCOSE 177*  BUN 14  CREATININE 0.78  CALCIUM 7.9*     Microbiology Results  Results for orders placed or performed during the hospital encounter of 06/17/16  Surgical pcr screen     Status: None   Collection Time: 06/17/16  7:46 PM  Result Value Ref Range Status   MRSA, PCR NEGATIVE NEGATIVE Final   Staphylococcus aureus NEGATIVE NEGATIVE Final    Comment:        The Xpert SA Assay (FDA approved for NASAL specimens in patients over 48 years of age), is one component of a comprehensive surveillance program.  Test performance has been validated by [facility] for patients greater than or equal to 21 year old. It is not intended to diagnose infection nor to guide or monitor treatment.     RADIOLOGY:  No results found.   Management plans discussed with the patient, her son and they are in agreement.  CODE STATUS:  Code Status History    Date Active Date Inactive Code Status Order ID Comments User Context   06/18/2016  1:23 AM 06/18/2016  9:47 PM Full Code [id number]  Dereck Leep, MD Inpatient   06/18/2016  1:22 AM 06/18/2016  1:23 AM Full Code [id number]  Lance Coon, MD Inpatient   05/18/2016  1:24 PM 05/20/2016  3:41 PM Full Code [id number]  Loletha Grayer, MD ED    07/18/2015  7:41 AM 07/22/2015  7:46 PM Full Code [id number]  Theodoro Grist, MD ED   05/14/2015  7:37 PM 05/18/2015  1:48 PM Full Code [id number]  Gladstone Lighter, MD Inpatient    Advance Directive Documentation   Flowsheet Row Most Recent Value  Type of Advance Directive  Living will, Healthcare Power of Attorney  Pre-existing out of facility DNR order (yellow form or pink MOST form)  No data  "MOST" Form in Place?  No data      TOTAL TIME TAKING CARE OF THIS PATIENT: 36 minutes.    Demetrios Loll M.D on 06/23/2016 at 8:49 AM  Between 7am to 6pm - Pager - [phone]  After 6pm go to [email or web address] - Proofreader  [facility]  Office  [phone]  CC: Primary care physician; Park Liter, DO   Note: This dictation was prepared with Dragon dictation along with smaller phrase technology. Any transcriptional errors that result from this process are unintentional.

## 2016-06-23 NOTE — Progress Notes (Signed)
Inpatient Diabetes Program Recommendations  AACE/ADA: New Consensus Statement on Inpatient Glycemic Control (2015)  Target Ranges:  Prepandial:   less than 140 mg/dL      [facility] postprandial:   less than 180 mg/dL (1-2 hours)      Critically ill patients:  140 - 180 mg/dL   Results for ASYAH, HUGUENIN (MRN [id number]) as of 06/23/2016 08:41  Ref. Range 06/22/2016 07:42 06/22/2016 11:11 06/22/2016 16:32 06/22/2016 21:02 06/23/2016 00:53 06/23/2016 04:41 06/23/2016 07:39  Glucose-Capillary Latest Ref Range: 65 - 99 mg/dL 145 (H) 217 (H) 231 (H) 221 (H) 171 (H) 155 (H) 134 (H)    Review of Glycemic Control  Current orders for Inpatient glycemic control: Novolog 0-9 units TID with meals, Novolog 0-5 units QHS  Inpatient Diabetes Program Recommendations: Insulin - Meal Coverage: Post prandial glucose is consistently elevated. If steroids are continued, please consider ordering Novolog 3 units TID with meals for meal coverage if patient eats at least 50% of meal.  Thanks, Barnie Alderman, RN, MSN, CDE Diabetes Coordinator Inpatient Diabetes Program [phone] (Team Pager from 8am to [place])

## 2016-06-23 NOTE — Progress Notes (Signed)
Patient is medically stable for D/C to [facility] today. Per [facility] liaison patient will go to room 501. RN will call report to RN Theressa Stamps at [phone] and arrange EMS for transport. Clinical Education officer, museum (CSW) sent D/C orders to [employer] via [employer]. [facility] Medicare authorization has been received. Patient is aware of above. CSW contacted patient's son Nicole Kindred and made him aware of above. Please reconsult if future social work needs arise. CSW signing off.   [employer], LCSW [phone]

## 2016-06-23 NOTE — Discharge Instructions (Signed)
Diet: As you were doing prior to hospitalization   Shower:  May shower but keep the wounds dry, use an occlusive plastic wrap, NO SOAKING IN TUB.  If the bandage gets wet, change with a clean dry gauze.  Dressing:  You may change your dressing as needed. Change the dressing with sterile gauze dressing.  Continue with TED hose x 6 wks, remove at night.  Activity:  Increase activity slowly as tolerated, but follow the weight bearing instructions below.  No lifting or driving for 6 weeks.  Weight Bearing:   Weight bearing as tolerated to left lower extremity  To prevent constipation: you may use a stool softener such as -  Colace (over the counter) 100 mg by mouth twice a day  Drink plenty of fluids (prune juice may be helpful) and high fiber foods Miralax (over the counter) for constipation as needed.    Itching:  If you experience itching with your medications, try taking only a single pain pill, or even half a pain pill at a time.  You may take up to 10 pain pills per day, and you can also use benadryl over the counter for itching or also to help with sleep.   Precautions:  If you experience chest pain or shortness of breath - call 911 immediately for transfer to the hospital emergency department!!  If you develop a fever greater that 101 F, purulent drainage from wound, increased redness or drainage from wound, or calf pain-Call [place]                                              Follow- Up Appointment:  Please call for an appointment to be seen in 2 weeks at [facility] healthy and ADA diet. Fall precaution. Continue O2 [place] 1-2 L,

## 2016-06-23 NOTE — Clinical Social Work Placement (Signed)
   CLINICAL SOCIAL WORK PLACEMENT  NOTE  Date:  06/23/2016  Patient Details  Name: Theresa Vasquez MRN: [id number] Date of Birth: [birth year]  Clinical Social Work is seeking post-discharge placement for this patient at the [place] level of care (*CSW will initial, date and re-position this form in  chart as items are completed):  Yes   Patient/family provided with [facility] Work Department's list of facilities offering this level of care within the geographic area requested by the patient (or if unable, by the patient's family).  Yes   Patient/family informed of their freedom to choose among providers that offer the needed level of care, that participate in Medicare, Medicaid or managed care program needed by the patient, have an available bed and are willing to accept the patient.  Yes   Patient/family informed of [facility]'s ownership interest in [facility] and [facility], as well as of the fact that they are under no obligation to receive care at these facilities.  PASRR submitted to EDS on       PASRR number received on       Existing PASRR number confirmed on 06/18/16     FL2 transmitted to all facilities in geographic area requested by pt/family on 06/18/16     FL2 transmitted to all facilities within larger geographic area on       Patient informed that his/her managed care company has contracts with or will negotiate with certain facilities, including the following:        Yes   Patient/family informed of bed offers received.  [facility] chooses bed at  ([facility] )     Physician recommends and patient chooses bed at      Patient to be transferred to  ([facility] ) on 06/23/16.  Patient to be transferred to facility by  [facility] )     Patient family notified on 06/23/16 of transfer.  Name of family member notified:   ([name] Nicole Kindred is aware of D/C today. )     PHYSICIAN       Additional Comment:     _______________________________________________ Kaslyn Richburg, Veronia Beets, LCSW 06/23/2016, 9:42 AM

## 2016-06-25 ENCOUNTER — Other Ambulatory Visit
Admission: RE | Admit: 2016-06-25 | Discharge: 2016-06-25 | Disposition: A | Discharge status: Home / Self Care | Payer: Medicare Other | Source: Other Acute Inpatient Hospital | Attending: Family Medicine | Admitting: Family Medicine

## 2016-06-25 DIAGNOSIS — R05 Cough: Secondary | ICD-10-CM | POA: Insufficient documentation

## 2016-06-29 ENCOUNTER — Encounter: Payer: Self-pay | Admitting: Orthopedic Surgery

## 2016-06-29 LAB — LEGIONELLA PNEUMOPHILA SEROGP 1 UR AG: L. PNEUMOPHILA SEROGP 1 UR AG: NEGATIVE

## 2016-06-29 NOTE — OR Nursing (Signed)
Opened chart to exclude this case from time averaging. Theresa Vasquez 06/29/2016

## 2016-07-07 ENCOUNTER — Ambulatory Visit: Payer: Medicare Other | Admitting: Family Medicine

## 2016-07-11 ENCOUNTER — Other Ambulatory Visit: Payer: Self-pay | Admitting: Family Medicine

## 2016-07-19 ENCOUNTER — Encounter: Payer: Self-pay | Admitting: Emergency Medicine

## 2016-07-19 ENCOUNTER — Emergency Department: Payer: Medicare Other

## 2016-07-19 ENCOUNTER — Inpatient Hospital Stay
Admission: EM | Admit: 2016-07-19 | Discharge: 2016-07-23 | DRG: 291 | Disposition: A | Discharge status: Skilled Nursing Facility | Payer: Medicare Other | Attending: Internal Medicine | Admitting: Internal Medicine

## 2016-07-19 DIAGNOSIS — I509 Heart failure, unspecified: Secondary | ICD-10-CM

## 2016-07-19 DIAGNOSIS — F419 Anxiety disorder, unspecified: Secondary | ICD-10-CM | POA: Diagnosis present

## 2016-07-19 DIAGNOSIS — R609 Edema, unspecified: Secondary | ICD-10-CM

## 2016-07-19 DIAGNOSIS — Z90711 Acquired absence of uterus with remaining cervical stump: Secondary | ICD-10-CM

## 2016-07-19 DIAGNOSIS — J9621 Acute and chronic respiratory failure with hypoxia: Secondary | ICD-10-CM | POA: Diagnosis present

## 2016-07-19 DIAGNOSIS — L97409 Non-pressure chronic ulcer of unspecified heel and midfoot with unspecified severity: Secondary | ICD-10-CM

## 2016-07-19 DIAGNOSIS — Z7984 Long term (current) use of oral hypoglycemic drugs: Secondary | ICD-10-CM

## 2016-07-19 DIAGNOSIS — E1142 Type 2 diabetes mellitus with diabetic polyneuropathy: Secondary | ICD-10-CM | POA: Diagnosis present

## 2016-07-19 DIAGNOSIS — F17210 Nicotine dependence, cigarettes, uncomplicated: Secondary | ICD-10-CM | POA: Diagnosis present

## 2016-07-19 DIAGNOSIS — J9622 Acute and chronic respiratory failure with hypercapnia: Secondary | ICD-10-CM | POA: Diagnosis present

## 2016-07-19 DIAGNOSIS — J441 Chronic obstructive pulmonary disease with (acute) exacerbation: Secondary | ICD-10-CM | POA: Diagnosis present

## 2016-07-19 DIAGNOSIS — I5033 Acute on chronic diastolic (congestive) heart failure: Secondary | ICD-10-CM | POA: Diagnosis present

## 2016-07-19 DIAGNOSIS — E1165 Type 2 diabetes mellitus with hyperglycemia: Secondary | ICD-10-CM | POA: Diagnosis present

## 2016-07-19 DIAGNOSIS — Z7951 Long term (current) use of inhaled steroids: Secondary | ICD-10-CM

## 2016-07-19 DIAGNOSIS — R2689 Other abnormalities of gait and mobility: Secondary | ICD-10-CM

## 2016-07-19 DIAGNOSIS — R52 Pain, unspecified: Secondary | ICD-10-CM

## 2016-07-19 DIAGNOSIS — E1122 Type 2 diabetes mellitus with diabetic chronic kidney disease: Secondary | ICD-10-CM | POA: Diagnosis present

## 2016-07-19 DIAGNOSIS — Z7982 Long term (current) use of aspirin: Secondary | ICD-10-CM

## 2016-07-19 DIAGNOSIS — R001 Bradycardia, unspecified: Secondary | ICD-10-CM | POA: Diagnosis present

## 2016-07-19 DIAGNOSIS — N184 Chronic kidney disease, stage 4 (severe): Secondary | ICD-10-CM | POA: Diagnosis present

## 2016-07-19 DIAGNOSIS — M6281 Muscle weakness (generalized): Secondary | ICD-10-CM

## 2016-07-19 DIAGNOSIS — M810 Age-related osteoporosis without current pathological fracture: Secondary | ICD-10-CM | POA: Diagnosis present

## 2016-07-19 DIAGNOSIS — Y95 Nosocomial condition: Secondary | ICD-10-CM

## 2016-07-19 DIAGNOSIS — J189 Pneumonia, unspecified organism: Secondary | ICD-10-CM

## 2016-07-19 DIAGNOSIS — L89629 Pressure ulcer of left heel, unspecified stage: Secondary | ICD-10-CM | POA: Diagnosis present

## 2016-07-19 DIAGNOSIS — Z9889 Other specified postprocedural states: Secondary | ICD-10-CM | POA: Diagnosis not present

## 2016-07-19 DIAGNOSIS — L8990 Pressure ulcer of unspecified site, unspecified stage: Secondary | ICD-10-CM | POA: Insufficient documentation

## 2016-07-19 DIAGNOSIS — R0902 Hypoxemia: Secondary | ICD-10-CM | POA: Diagnosis present

## 2016-07-19 DIAGNOSIS — Z833 Family history of diabetes mellitus: Secondary | ICD-10-CM | POA: Diagnosis not present

## 2016-07-19 DIAGNOSIS — E039 Hypothyroidism, unspecified: Secondary | ICD-10-CM | POA: Diagnosis present

## 2016-07-19 DIAGNOSIS — I130 Hypertensive heart and chronic kidney disease with heart failure and stage 1 through stage 4 chronic kidney disease, or unspecified chronic kidney disease: Principal | ICD-10-CM | POA: Diagnosis present

## 2016-07-19 DIAGNOSIS — M1990 Unspecified osteoarthritis, unspecified site: Secondary | ICD-10-CM | POA: Diagnosis present

## 2016-07-19 DIAGNOSIS — Z8711 Personal history of peptic ulcer disease: Secondary | ICD-10-CM | POA: Diagnosis not present

## 2016-07-19 DIAGNOSIS — F0150 Vascular dementia without behavioral disturbance: Secondary | ICD-10-CM | POA: Diagnosis present

## 2016-07-19 DIAGNOSIS — R0600 Dyspnea, unspecified: Secondary | ICD-10-CM

## 2016-07-19 LAB — BASIC METABOLIC PANEL
Anion gap: 8 (ref 5–15)
BUN: 10 mg/dL (ref 6–20)
CALCIUM: 8.3 mg/dL — AB (ref 8.9–10.3)
CHLORIDE: 101 mmol/L (ref 101–111)
CO2: 31 mmol/L (ref 22–32)
CREATININE: 0.68 mg/dL (ref 0.44–1.00)
GFR calc non Af Amer: >60 mL/min (ref >60)
Glucose, Bld: 223 mg/dL — ABNORMAL HIGH (ref 65–99)
Potassium: 3.5 mmol/L (ref 3.5–5.1)
SODIUM: 140 mmol/L (ref 135–145)

## 2016-07-19 LAB — TROPONIN I

## 2016-07-19 LAB — LACTIC ACID, PLASMA
LACTIC ACID, VENOUS: 2.4 mmol/L — AB (ref 0.5–1.9)
Lactic Acid, Venous: 3 mmol/L (ref 0.5–1.9)

## 2016-07-19 LAB — GLUCOSE, CAPILLARY
GLUCOSE-CAPILLARY: 245 mg/dL — AB (ref 65–99)
Glucose-Capillary: 245 mg/dL — ABNORMAL HIGH (ref 65–99)
Glucose-Capillary: 210 mg/dL — ABNORMAL HIGH (ref 65–99)

## 2016-07-19 LAB — CBC
HCT: 30.5 % — ABNORMAL LOW (ref 35.0–47.0)
HEMOGLOBIN: 10 g/dL — AB (ref 12.0–16.0)
MCH: 30.2 pg (ref 26.0–34.0)
MCHC: 32.7 g/dL (ref 32.0–36.0)
MCV: 92.1 fL (ref 80.0–100.0)
PLATELETS: 443 10*3/uL — AB (ref 150–440)
RBC: 3.31 MIL/uL — ABNORMAL LOW (ref 3.80–5.20)
RDW: 17.9 % — AB (ref 11.5–14.5)
WBC: 14.7 10*3/uL — ABNORMAL HIGH (ref 3.6–11.0)

## 2016-07-19 LAB — BRAIN NATRIURETIC PEPTIDE: B NATRIURETIC PEPTIDE 5: 403 pg/mL — AB (ref 0.0–100.0)

## 2016-07-19 LAB — MRSA PCR SCREENING: MRSA BY PCR: NEGATIVE

## 2016-07-19 MED ORDER — FUROSEMIDE 10 MG/ML IJ SOLN
60.0000 mg | Freq: Once | INTRAMUSCULAR | Status: AC
  Administered 2016-07-19: 60 mg via INTRAVENOUS
  Filled 2016-07-19: qty 8

## 2016-07-19 MED ORDER — MOMETASONE FURO-FORMOTEROL FUM 200-5 MCG/ACT IN AERO
2.0000 | INHALATION_SPRAY | Freq: Two times a day (BID) | RESPIRATORY_TRACT | Status: DC
  Administered 2016-07-19 – 2016-07-23 (×8): 2 via RESPIRATORY_TRACT
  Filled 2016-07-19: qty 8.8

## 2016-07-19 MED ORDER — ALBUTEROL SULFATE (2.5 MG/3ML) 0.083% IN NEBU
2.5000 mg | INHALATION_SOLUTION | RESPIRATORY_TRACT | Status: DC | PRN
  Administered 2016-07-20: 10:00:00 2.5 mg via RESPIRATORY_TRACT
  Filled 2016-07-19: qty 3

## 2016-07-19 MED ORDER — ACETAMINOPHEN 650 MG RE SUPP: 650.0000 mg | Freq: Four times a day (QID) | RECTAL | Status: DC | PRN

## 2016-07-19 MED ORDER — CEFEPIME-DEXTROSE 1 GM/50ML IV SOLR
1.0000 g | Freq: Once | INTRAVENOUS | Status: AC
  Administered 2016-07-19: 1 g via INTRAVENOUS
  Filled 2016-07-19: qty 50

## 2016-07-19 MED ORDER — GUAIFENESIN-DM 100-10 MG/5ML PO SYRP: 5.0000 mL | ORAL_SOLUTION | ORAL | Status: DC | PRN

## 2016-07-19 MED ORDER — GABAPENTIN 300 MG PO CAPS
400.0000 mg | ORAL_CAPSULE | Freq: Two times a day (BID) | ORAL | Status: DC
  Administered 2016-07-19 – 2016-07-23 (×9): 400 mg via ORAL
  Filled 2016-07-19 (×9): qty 1

## 2016-07-19 MED ORDER — VENLAFAXINE HCL ER 37.5 MG PO CP24
37.5000 mg | ORAL_CAPSULE | Freq: Every day | ORAL | Status: DC
  Administered 2016-07-20 – 2016-07-23 (×4): 37.5 mg via ORAL
  Filled 2016-07-19 (×4): qty 1

## 2016-07-19 MED ORDER — MELATONIN 5 MG PO TABS
5.0000 mg | ORAL_TABLET | Freq: Every day | ORAL | Status: DC
  Administered 2016-07-19 – 2016-07-22 (×4): 5 mg via ORAL
  Filled 2016-07-19 (×5): qty 1

## 2016-07-19 MED ORDER — VANCOMYCIN HCL IN DEXTROSE 1-5 GM/200ML-% IV SOLN
1000.0000 mg | Freq: Once | INTRAVENOUS | Status: AC
  Administered 2016-07-19: 1000 mg via INTRAVENOUS
  Filled 2016-07-19: qty 200

## 2016-07-19 MED ORDER — FERROUS SULFATE 325 (65 FE) MG PO TABS
325.0000 mg | ORAL_TABLET | Freq: Two times a day (BID) | ORAL | Status: DC
  Administered 2016-07-19 – 2016-07-23 (×9): 325 mg via ORAL
  Filled 2016-07-19 (×9): qty 1

## 2016-07-19 MED ORDER — ACETAMINOPHEN 325 MG PO TABS
650.0000 mg | ORAL_TABLET | Freq: Four times a day (QID) | ORAL | Status: DC | PRN
  Administered 2016-07-19 – 2016-07-23 (×4): 650 mg via ORAL
  Filled 2016-07-19 (×5): qty 2

## 2016-07-19 MED ORDER — AMIODARONE HCL 200 MG PO TABS
100.0000 mg | ORAL_TABLET | Freq: Every day | ORAL | Status: DC
  Administered 2016-07-19: 100 mg via ORAL
  Filled 2016-07-19 (×2): qty 1

## 2016-07-19 MED ORDER — METOPROLOL TARTRATE 25 MG PO TABS
25.0000 mg | ORAL_TABLET | Freq: Two times a day (BID) | ORAL | Status: DC
  Administered 2016-07-19: 20:00:00 25 mg via ORAL
  Filled 2016-07-19 (×2): qty 1

## 2016-07-19 MED ORDER — DIVALPROEX SODIUM 250 MG PO DR TAB
250.0000 mg | DELAYED_RELEASE_TABLET | Freq: Three times a day (TID) | ORAL | Status: DC
  Administered 2016-07-19 – 2016-07-23 (×13): 250 mg via ORAL
  Filled 2016-07-19 (×13): qty 1

## 2016-07-19 MED ORDER — QUETIAPINE FUMARATE 25 MG PO TABS
25.0000 mg | ORAL_TABLET | Freq: Every day | ORAL | Status: DC
  Administered 2016-07-19 – 2016-07-22 (×4): 25 mg via ORAL
  Filled 2016-07-19 (×4): qty 1

## 2016-07-19 MED ORDER — DONEPEZIL HCL 5 MG PO TABS
10.0000 mg | ORAL_TABLET | Freq: Every day | ORAL | Status: DC
  Administered 2016-07-19 – 2016-07-22 (×4): 10 mg via ORAL
  Filled 2016-07-19 (×4): qty 2

## 2016-07-19 MED ORDER — GUAIFENESIN ER 600 MG PO TB12
600.0000 mg | ORAL_TABLET | Freq: Two times a day (BID) | ORAL | Status: DC
  Administered 2016-07-19 – 2016-07-23 (×9): 600 mg via ORAL
  Filled 2016-07-19 (×9): qty 1

## 2016-07-19 MED ORDER — MAGNESIUM SULFATE 2 GM/50ML IV SOLN
2.0000 g | Freq: Once | INTRAVENOUS | Status: AC
  Administered 2016-07-19: 2 g via INTRAVENOUS
  Filled 2016-07-19: qty 50

## 2016-07-19 MED ORDER — ONDANSETRON HCL 4 MG PO TABS: 4.0000 mg | ORAL_TABLET | Freq: Four times a day (QID) | ORAL | Status: DC | PRN

## 2016-07-19 MED ORDER — LISINOPRIL 20 MG PO TABS
20.0000 mg | ORAL_TABLET | Freq: Two times a day (BID) | ORAL | Status: DC
  Administered 2016-07-19: 20:00:00 20 mg via ORAL
  Filled 2016-07-19 (×2): qty 1

## 2016-07-19 MED ORDER — SENNOSIDES-DOCUSATE SODIUM 8.6-50 MG PO TABS: 1.0000 | ORAL_TABLET | Freq: Every evening | ORAL | Status: DC | PRN

## 2016-07-19 MED ORDER — INSULIN ASPART 100 UNIT/ML ~~LOC~~ SOLN
0.0000 [IU] | Freq: Every day | SUBCUTANEOUS | Status: DC
  Administered 2016-07-20 – 2016-07-21 (×2): 3 [IU] via SUBCUTANEOUS
  Administered 2016-07-22: 4 [IU] via SUBCUTANEOUS
  Filled 2016-07-19: qty 3
  Filled 2016-07-19: qty 4
  Filled 2016-07-19: qty 3

## 2016-07-19 MED ORDER — AMLODIPINE BESYLATE 5 MG PO TABS
5.0000 mg | ORAL_TABLET | Freq: Two times a day (BID) | ORAL | Status: DC
  Administered 2016-07-19 – 2016-07-23 (×7): 5 mg via ORAL
  Filled 2016-07-19 (×8): qty 1

## 2016-07-19 MED ORDER — ENOXAPARIN SODIUM 40 MG/0.4ML ~~LOC~~ SOLN
40.0000 mg | SUBCUTANEOUS | Status: DC
  Administered 2016-07-19 – 2016-07-22 (×4): 40 mg via SUBCUTANEOUS
  Filled 2016-07-19 (×3): qty 0.4

## 2016-07-19 MED ORDER — INSULIN ASPART 100 UNIT/ML ~~LOC~~ SOLN
0.0000 [IU] | Freq: Three times a day (TID) | SUBCUTANEOUS | Status: DC
  Administered 2016-07-19 – 2016-07-20 (×3): 3 [IU] via SUBCUTANEOUS
  Administered 2016-07-20: 9 [IU] via SUBCUTANEOUS
  Administered 2016-07-21 (×2): 5 [IU] via SUBCUTANEOUS
  Administered 2016-07-21: 1 [IU] via SUBCUTANEOUS
  Administered 2016-07-22: 5 [IU] via SUBCUTANEOUS
  Administered 2016-07-22 (×2): 3 [IU] via SUBCUTANEOUS
  Administered 2016-07-23: 7 [IU] via SUBCUTANEOUS
  Administered 2016-07-23 (×2): 3 [IU] via SUBCUTANEOUS
  Filled 2016-07-19: qty 9
  Filled 2016-07-19: qty 3
  Filled 2016-07-19 (×2): qty 5
  Filled 2016-07-19: qty 3
  Filled 2016-07-19: qty 5
  Filled 2016-07-19: qty 2
  Filled 2016-07-19 (×3): qty 3

## 2016-07-19 MED ORDER — FUROSEMIDE 10 MG/ML IJ SOLN
40.0000 mg | Freq: Two times a day (BID) | INTRAMUSCULAR | Status: DC
  Administered 2016-07-19 – 2016-07-21 (×6): 40 mg via INTRAVENOUS
  Filled 2016-07-19 (×6): qty 4

## 2016-07-19 MED ORDER — ONDANSETRON HCL 4 MG/2ML IJ SOLN: 4.0000 mg | Freq: Four times a day (QID) | INTRAMUSCULAR | Status: DC | PRN

## 2016-07-19 MED ORDER — TIOTROPIUM BROMIDE MONOHYDRATE 18 MCG IN CAPS
18.0000 ug | ORAL_CAPSULE | Freq: Every day | RESPIRATORY_TRACT | Status: DC
  Administered 2016-07-19 – 2016-07-23 (×5): 18 ug via RESPIRATORY_TRACT
  Filled 2016-07-19: qty 5

## 2016-07-19 MED ORDER — LEVOTHYROXINE SODIUM 75 MCG PO TABS
175.0000 ug | ORAL_TABLET | Freq: Every day | ORAL | Status: DC
  Administered 2016-07-20 – 2016-07-23 (×4): 175 ug via ORAL
  Filled 2016-07-19 (×4): qty 1

## 2016-07-19 MED ORDER — VITAMIN D 1000 UNITS PO TABS
1000.0000 [IU] | ORAL_TABLET | Freq: Every day | ORAL | Status: DC
  Administered 2016-07-19 – 2016-07-23 (×5): 1000 [IU] via ORAL
  Filled 2016-07-19 (×5): qty 1

## 2016-07-19 MED ORDER — ASPIRIN EC 81 MG PO TBEC
81.0000 mg | DELAYED_RELEASE_TABLET | Freq: Every day | ORAL | Status: DC
Start: 2016-07-19 — End: 2016-07-24
  Administered 2016-07-19 – 2016-07-23 (×5): 81 mg via ORAL
  Filled 2016-07-19 (×5): qty 1

## 2016-07-19 MED ORDER — MORPHINE SULFATE (CONCENTRATE) 10 MG/0.5ML PO SOLN
10.0000 mg | ORAL | Status: DC | PRN
  Administered 2016-07-19: 10 mg via ORAL
  Filled 2016-07-19: qty 1

## 2016-07-19 NOTE — ED Provider Notes (Signed)
[facility] Emergency Department Provider Note   ____________________________________________   First MD Initiated Contact with Patient 07/19/16 [phone]     (approximate)  I have reviewed the triage vital signs and the nursing notes.   HISTORY  Chief Complaint Shortness of Breath (Pt. here via EMS from [facility] for respirtory distress.)    HPI Theresa Vasquez is a 82 y.o. female who comes into the hospital today with some respiratory distress. According to EMS the patient had initial O2 sat in the 60s. The patient was given 2 DuoNeb's and 2 albuterol nebs. She also received 125 mg of Solu-Medrol. According to EMS she was initially wheezing throughout all of her lung fields. She does not have a history of COPD or asthma. The patient reports that she started feeling shortness of breath this evening. She reports that she's never had this before. She's had no fevers at home and she denies any chest pain. The patient was placed on BiPAP when she arrived she reports that her breathing feels much better at this time. The patient is here for evaluation today.   Past Medical History:  Diagnosis Date  . Adenomatous polyps   . Age-related osteoporosis without current pathological fracture   . Anemia   . Anxiety   . Atherosclerosis of aorta ([place])   . Atrial fibrillation with RVR ([place])   . Barrett's esophagus without dysplasia   . CHF (congestive heart failure) ([place])   . Colitis   . COPD (chronic obstructive pulmonary disease) ([place])   . Dementia   . Depression   . Diabetes mellitus type II, uncontrolled ([place])   . Diverticulitis   . Diverticulosis of intestine without perforation or abscess without bleeding   . Edema   . Essential hemorrhagic thrombocythemia ([place])   . Gastrointestinal hemorrhage   . High anion gap metabolic acidosis   . Hyperlipidemia   . Hypertension   . Hypertensive chronic kidney disease with stage 1 through stage 4 chronic kidney  disease, or unspecified chronic kidney disease   . Hypothyroidism   . Hypoxemia   . Iron deficiency anemia   . Lactic acidosis   . Left bundle branch block   . Nonrheumatic mitral valve insufficiency   . Osteoarthritis   . Other psychoactive substance dependence, in remission ([place])   . Other specified symptoms and signs involving the circulatory and respiratory systems   . Paroxysmal atrial fibrillation (HCC)    s/p cardioversion  . Personal history of peptic ulcer disease   . Pulmonary edema   . PVD (peripheral vascular disease) ([place]) 05/14/2015  . Recurrent pneumonia    history of  . Respiratory failure with hypoxia and hypercapnia ([place])   . Type 2 diabetes mellitus with diabetic chronic kidney disease ([place])   . Type 2 diabetes mellitus with diabetic polyneuropathy ([place])   . Vascular dementia without behavioral disturbance   . Vascular disorder of intestine [facility])     Patient Active Problem List   Diagnosis Date Noted  . Closed left hip fracture ([place]) 06/17/2016  . Acute respiratory failure with hypoxia ([place]) 05/18/2016  . Benign neoplasm 12/27/2015  . PNA (pneumonia) 07/18/2015  . Acute diastolic CHF (congestive heart failure) ([place])   . Paroxysmal atrial fibrillation ([place]) 05/28/2015  . Acute GI bleeding   . Type 2 DM with CKD stage 2 and hypertension ([place]) 05/14/2015  . COPD, severe ([place]) 05/14/2015  . History of pneumonia 05/14/2015  . Benign hypertensive renal disease 05/14/2015  .  Anxiety disorder 05/14/2015  . Diabetic polyneuropathy ([place]) 05/14/2015  . Diverticulosis 05/14/2015  . Abnormality of gait 05/14/2015  . Barrett's esophagus 05/14/2015  . Drug addiction in remission ([place]) 05/14/2015  . Mitral regurgitation 05/14/2015  . LBBB (left bundle branch block) 05/14/2015  . Carotid bruit 05/14/2015  . Ischemic colitis ([place]) 05/14/2015  . History of peptic ulcer disease 05/14/2015  . Chronic diastolic CHF (congestive heart failure) ([place]) 05/14/2015  .  Atherosclerosis of aorta ([place]) 05/14/2015  . Involutional osteoporosis 05/14/2015  . Diverticulitis 05/14/2015  . Essential thrombocythemia ([place]) 05/14/2015  . H/O recurrent pneumonia 05/14/2015  . Heart & renal disease, hypertensive, with heart failure ([place]) 05/14/2015  . Adult hypothyroidism 05/14/2015  . Hypoxemia 05/14/2015  . Non-rheumatic mitral regurgitation 05/14/2015  . Other specified symptoms and signs involving the circulatory and respiratory systems 05/14/2015  . Psychoactive substance dependence ([place]) 05/14/2015  . Peripheral vascular disease ([place]) 05/14/2015  . Vascular dementia with behavioral disturbance 05/14/2015  . Vascular disorder of intestine ([place]) 05/14/2015  . Iron deficiency anemia 11/24/2014  . Chronic systolic heart failure ([place]) 06/05/2014  . Lung edema 06/05/2014  . Alveolar hypoventilation 06/05/2014  . Arthritis, degenerative 02/20/2014    Past Surgical History:  Procedure Laterality Date  . ABDOMINAL HYSTERECTOMY     partial  . APPENDECTOMY    . COLONOSCOPY    . ESOPHAGOGASTRODUODENOSCOPY    . ESOPHAGOGASTRODUODENOSCOPY (EGD) WITH PROPOFOL N/A 07/04/2015   Procedure: ESOPHAGOGASTRODUODENOSCOPY (EGD) WITH PROPOFOL;  Surgeon: Manya Silvas, MD;  Location: [facility] ENDOSCOPY;  Service: Endoscopy;  Laterality: N/A;  . INTRAMEDULLARY (IM) NAIL INTERTROCHANTERIC Left 06/18/2016   Procedure: INTRAMEDULLARY (IM) NAIL INTERTROCHANTRIC;  Surgeon: Dereck Leep, MD;  Location: [facility] ORS;  Service: Orthopedics;  Laterality: Left;  . SHOULDER ARTHROSCOPY    . SIGMOIDOSCOPY      Prior to Admission medications   Medication Sig Start Date End Date Taking? Authorizing Provider  acetaminophen (TYLENOL) 500 MG tablet Take 500 mg by mouth every 4 (four) hours as needed.    Historical Provider, MD  albuterol (PROVENTIL) (2.5 MG/3ML) 0.083% nebulizer solution Take 3 mLs (2.5 mg total) by nebulization every 4 (four) hours as needed for wheezing or shortness of breath.  11/26/15   Megan P Johnson, DO  amiodarone (PACERONE) 200 MG tablet Take 100 mg by mouth daily.     Historical Provider, MD  amLODipine (NORVASC) 5 MG tablet Take 1 tablet (5 mg total) by mouth 2 (two) times daily. 11/26/15   Megan P Johnson, DO  amoxicillin-clavulanate (AUGMENTIN) 875-125 MG tablet Take 1 tablet by mouth 2 (two) times daily. 06/23/16   Demetrios Loll, MD  aspirin EC 81 MG tablet Take 81 mg by mouth daily.     Historical Provider, MD  Cholecalciferol (VITAMIN D-3) 1000 units CAPS Take 1 capsule (1,000 Units total) by mouth daily. 11/26/15   Megan P Johnson, DO  divalproex (DEPAKOTE) 250 MG DR tablet Take 1 tablet (250 mg total) by mouth 3 (three) times daily. 11/26/15   Megan P Johnson, DO  donepezil (ARICEPT) 10 MG tablet Take 1 tablet (10 mg total) by mouth at bedtime. 11/26/15   Megan P Johnson, DO  enoxaparin (LOVENOX) 40 MG/0.4ML injection Inject 0.4 mLs (40 mg total) into the skin daily. 06/24/16   Demetrios Loll, MD  ferrous sulfate 325 (65 FE) MG tablet Take 1 tablet (325 mg total) by mouth 2 (two) times daily with a meal. Patient taking differently: Take 325 mg by mouth daily.  11/26/15  Megan P Johnson, DO  Fluticasone-Salmeterol (ADVAIR) 250-50 MCG/DOSE AEPB Inhale 1 puff into the lungs 2 (two) times daily. 11/26/15   Megan P Johnson, DO  furosemide (LASIX) 40 MG tablet Take 1 tablet (40 mg total) by mouth 2 (two) times daily. 11/26/15   Megan P Johnson, DO  gabapentin (NEURONTIN) 400 MG capsule TAKE 1 CAPSULE TWICE DAILY 07/14/16   Volney American, PA-C  guaiFENesin (MUCINEX) 600 MG 12 hr tablet Take 1 tablet (600 mg total) by mouth 2 (two) times daily. 06/23/16   Demetrios Loll, MD  guaiFENesin-dextromethorphan (ROBITUSSIN DM) 100-10 MG/5ML syrup Take 5 mLs by mouth every 4 (four) hours as needed for cough. 06/23/16   Demetrios Loll, MD  levothyroxine (SYNTHROID, LEVOTHROID) 150 MCG tablet TAKE ONE (1) TABLET EACH DAY BEFORE BREAKFAST 07/14/16   Volney American, PA-C  lisinopril  (PRINIVIL,ZESTRIL) 20 MG tablet Take 1 tablet (20 mg total) by mouth 2 (two) times daily. 11/26/15   Megan P Johnson, DO  Melatonin 3 MG TABS Take 1 tablet (3 mg total) by mouth at bedtime. 05/28/15   Megan P Johnson, DO  metFORMIN (GLUCOPHAGE) 500 MG tablet TAKE ONE TABLET EVERY DAY WITH BREAKFAST 06/12/16   Megan P Johnson, DO  metFORMIN (GLUMETZA) 500 MG (MOD) 24 hr tablet Take 1 tablet (500 mg total) by mouth daily with breakfast. 11/26/15   Megan P Johnson, DO  metoprolol (LOPRESSOR) 50 MG tablet Take 0.5 tablets (25 mg total) by mouth 2 (two) times daily. 11/26/15   Megan P Johnson, DO  MICROLET LANCETS MISC TEST BLOOD GLUCOSE LEVEL ONCE DAILY 04/10/16   Megan P Johnson, DO  nitrofurantoin, macrocrystal-monohydrate, (MACROBID) 100 MG capsule Take 1 capsule (100 mg total) by mouth 2 (two) times daily. Patient not taking: Reported on 06/17/2016 06/08/16   Megan P Johnson, DO  oxyCODONE (OXY IR/ROXICODONE) 5 MG immediate release tablet Take 1 tablet (5 mg total) by mouth every 6 (six) hours as needed for breakthrough pain ((for MODERATE breakthrough pain)). 06/23/16   Demetrios Loll, MD  pantoprazole (PROTONIX) 40 MG tablet Take 1 tablet (40 mg total) by mouth 2 (two) times daily. Patient taking differently: Take 40 mg by mouth daily.  11/26/15   Megan P Johnson, DO  QUEtiapine (SEROQUEL) 25 MG tablet Take 1 tablet (25 mg total) by mouth at bedtime. 11/26/15   Megan P Johnson, DO  senna-docusate (SENOKOT-S) 8.6-50 MG tablet Take 1 tablet by mouth at bedtime as needed for mild constipation. 06/23/16   Demetrios Loll, MD  tiotropium (SPIRIVA) 18 MCG inhalation capsule Place 1 capsule (18 mcg total) into inhaler and inhale daily. 11/26/15   Megan P Johnson, DO  venlafaxine XR (EFFEXOR XR) 37.5 MG 24 hr capsule Take 1 capsule (37.5 mg total) by mouth daily with breakfast. 11/26/15   Valerie Roys, DO    Allergies Patient has no known allergies.  Family History  Problem Relation Age of Onset  . Diabetes Mother   .  Diabetes Father     Social History Social History  Substance Use Topics  . Smoking status: Current Every Day Smoker    Packs/day: 1.00    Years: 60.00    Types: Cigarettes  . Smokeless tobacco: Never Used  . Alcohol use No    Review of Systems Constitutional: No fever/chills Eyes: No visual changes. ENT: No sore throat. Cardiovascular: Denies chest pain. Respiratory:  shortness of breath. Gastrointestinal: No abdominal pain.  No nausea, no vomiting.  No diarrhea.  No constipation. Genitourinary:  Negative for dysuria. Musculoskeletal: Negative for back pain. Skin: Negative for rash. Neurological: Negative for headaches, focal weakness or numbness.  10-point ROS otherwise negative.  ____________________________________________   PHYSICAL EXAM:  VITAL SIGNS: ED Triage Vitals  Enc Vitals Group     BP 07/19/16 0646 135/72     Pulse Rate 07/19/16 0646 100     Resp 07/19/16 0646 (!) 23     Temp 07/19/16 0646 97.9 F (36.6 C)     Temp Source 07/19/16 0646 Axillary     SpO2 07/19/16 0646 92 %     Weight 07/19/16 0647 195 lb (88.5 kg)     Height 07/19/16 0647 5\' 2"  (1.575 m)     Head Circumference --      [facility] Flow --      Pain Score 07/19/16 0649 5     Pain Loc --      Pain Edu? --      Excl. in [place]? --     Constitutional: Alert and oriented. Well appearing and in Moderate, respiratory distress. Eyes: Conjunctivae are normal. PERRL. EOMI. Head: Atraumatic. Nose: No congestion/rhinnorhea. Mouth/Throat: Mucous membranes are moist.  Oropharynx non-erythematous. Cardiovascular: Normal rate, regular rhythm. Grossly normal heart sounds.  Good peripheral circulation. Respiratory: Increased respiratory effort.  No retractions. Expiratory wheezes in right lung with some mild wheezes on the left in the upper lobe. Gastrointestinal: Soft and nontender. No distention. Positive bowel sounds Musculoskeletal: No lower extremity tenderness nor edema.   Neurologic:  Normal speech  and language.  Skin:  Skin is warm, dry and intact. Marland Kitchen Psychiatric: Mood and affect are normal.   ____________________________________________   LABS (all labs ordered are listed, but only abnormal results are displayed)  Labs Reviewed  CBC - Abnormal; Notable for the following:       Result Value   WBC 14.7 (*)    RBC 3.31 (*)    Hemoglobin 10.0 (*)    HCT 30.5 (*)    RDW 17.9 (*)    Platelets 443 (*)    All other components within normal limits  BASIC METABOLIC PANEL - Abnormal; Notable for the following:    Glucose, Bld 223 (*)    Calcium 8.3 (*)    All other components within normal limits  BRAIN NATRIURETIC PEPTIDE - Abnormal; Notable for the following:    B Natriuretic Peptide 403.0 (*)    All other components within normal limits  CULTURE, BLOOD (ROUTINE X 2)  CULTURE, BLOOD (ROUTINE X 2)  TROPONIN I  LACTIC ACID, PLASMA  LACTIC ACID, PLASMA   ____________________________________________  EKG  ED ECG REPORT I, Loney Hering, the attending physician, personally viewed and interpreted this ECG.   Date: 07/19/2016  EKG Time: 0647  Rate: 100  Rhythm: Sinus tachycardia  Axis: Normal axis  Intervals:left bundle branch block  ST&T Change: Left bundle branch block with some flipped T waves but does not meet [place] criteria for STEMI  ____________________________________________  RADIOLOGY  CXR ____________________________________________   PROCEDURES  Procedure(s) performed: None  Procedures  Critical Care performed: Yes, see critical care note(s)  CRITICAL CARE Performed by: Charlesetta Ivory P   Total critical care time: 30 minutes  Critical care time was exclusive of separately billable procedures and treating other patients.  Critical care was necessary to treat or prevent imminent or life-threatening deterioration.  Critical care was time spent personally by me on the following activities: development of treatment plan with patient  and/or surrogate as well  as nursing, discussions with consultants, evaluation of patient's response to treatment, examination of patient, obtaining history from patient or surrogate, ordering and performing treatments and interventions, ordering and review of laboratory studies, ordering and review of radiographic studies, pulse oximetry and re-evaluation of patient's condition.   ____________________________________________   INITIAL IMPRESSION / ASSESSMENT AND PLAN / ED COURSE  Pertinent labs & imaging results that were available during my care of the patient were reviewed by me and considered in my medical decision making (see chart for details).  This is an 82 year old female who comes into the hospital today with difficulty breathing. The patient received lots of interventions prior to coming in and after being put on BiPAP she was feeling much improved. She did have some wheezing in her right side so I did send her for a chest x-ray. It appears that though the patient has a pneumonia on the right and left lung base opacity. The patient has a mildly elevated white blood cell count of 14.7 and a slightly elevated BNP. I will give the patient some vancomycin and cefepime as the patient was admitted to the hospital a month ago with a hip fracture. The patient is currently on BiPAP so we will consider transferring the patient as there are no ICU beds available. I will continue to monitor the patient. I will also send a lactic acid.  Clinical Course as of Jul 20 851  Sun Jul 19, 2016  0744 1. Mixed interstitial and airspace opacities most evident in the right lung and most confluent at the right lung base with milder left lung base opacity. Findings may reflect asymmetric pulmonary edema or multifocal pneumonia.   DG Chest Portable 1 View [AW]    Clinical Course User Index [AW] Loney Hering, MD    I contacted Dr. Mortimer Fries from the intensive care unit service. The patient is on BiPAP and  would need to go to the intensive care unit. He reports that he thinks that was some time the patient should be able to come off the BiPAP. He recommends giving the patient 60 mg of Lasix and giving her another hour on the BiPAP. At that point he thinks she would be able to come off. The patient's care will be signed out to Dr. Quentin Cornwall who will reassess the patient on the BiPAP and determine if she can be omitted to the floor she still needs to be admitted to the intensive care unit. ____________________________________________   FINAL CLINICAL IMPRESSION(S) / ED DIAGNOSES  Final diagnoses:  Hospital-acquired pneumonia  Dyspnea, unspecified type  Hypoxia      NEW MEDICATIONS STARTED DURING THIS VISIT:  New Prescriptions   No medications on file     Note:  This document was prepared using Dragon voice recognition software and may include unintentional dictation errors.    Loney Hering, MD 07/19/16 [phone]

## 2016-07-19 NOTE — ED Notes (Signed)
Pt incontinent of stool and urine. Pt hygiene provided and clean dry brief applied. Pt tolerated procedure without difficulty. BiPap removed per Dr. Sheilah Mins request. Pt placed on 6L O2 via [place].

## 2016-07-19 NOTE — H&P (Signed)
[facility] [place] NAME: Theresa Vasquez    MR#:  [id number]  [place] OF BIRTH:  10/27/1930  DATE OF ADMISSION:  07/19/2016  PRIMARY CARE PHYSICIAN: Park Liter, DO   REQUESTING/REFERRING PHYSICIAN: Dr. Merlyn Lot  CHIEF COMPLAINT:   Chief Complaint  Patient presents with  . Shortness of Breath    Pt. here via EMS from [facility] for respirtory distress.    HISTORY OF PRESENT ILLNESS:  Theresa Vasquez  is a 82 y.o. female with a known history of COPD, CHF, dementia, depression, diabetes type 2 without complication, history of diverticulosis, history of hypothyroidism, peripheral vascular disease who was just discharged a few weeks ago to a skilled nursing facility after having a hip fracture now returns back due to shortness of breath and hypoxia. Patient herself is a poor historian therefore most of the history obtained from the son at bedside and also from the chart. Patient apparently was noted by the nursing staff at the skilled nursing facility to be short of breath and having significant wheezing bilaterally and noted to be hypoxic with O2 sats in the 60s. She was transferred to the emergency room. She was given some IV Solu-Medrol, nebulizer treatments, Lasix and has clinically improved since then. She was initially on BiPAP but has been weaned off of it now. Her chest x-ray findings are suggestive of interstitial edema and possible pneumonia. She has been weaned off the BiPAP but still appears to be short of breath and therefore hospitalist services were contacted further treatment and evaluation.  PAST MEDICAL HISTORY:   Past Medical History:  Diagnosis Date  . Adenomatous polyps   . Age-related osteoporosis without current pathological fracture   . Anemia   . Anxiety   . Atherosclerosis of aorta ([place])   . Atrial fibrillation with RVR ([place])   . Barrett's esophagus without dysplasia   . CHF (congestive heart failure) ([place])    . Colitis   . COPD (chronic obstructive pulmonary disease) ([place])   . Dementia   . Depression   . Diabetes mellitus type II, uncontrolled ([place])   . Diverticulitis   . Diverticulosis of intestine without perforation or abscess without bleeding   . Edema   . Essential hemorrhagic thrombocythemia ([place])   . Gastrointestinal hemorrhage   . High anion gap metabolic acidosis   . Hyperlipidemia   . Hypertension   . Hypertensive chronic kidney disease with stage 1 through stage 4 chronic kidney disease, or unspecified chronic kidney disease   . Hypothyroidism   . Hypoxemia   . Iron deficiency anemia   . Lactic acidosis   . Left bundle branch block   . Nonrheumatic mitral valve insufficiency   . Osteoarthritis   . Other psychoactive substance dependence, in remission ([place])   . Other specified symptoms and signs involving the circulatory and respiratory systems   . Paroxysmal atrial fibrillation (HCC)    s/p cardioversion  . Personal history of peptic ulcer disease   . Pulmonary edema   . PVD (peripheral vascular disease) ([place]) 05/14/2015  . Recurrent pneumonia    history of  . Respiratory failure with hypoxia and hypercapnia ([place])   . Type 2 diabetes mellitus with diabetic chronic kidney disease ([place])   . Type 2 diabetes mellitus with diabetic polyneuropathy ([place])   . Vascular dementia without behavioral disturbance   . Vascular disorder of intestine ([place])     PAST SURGICAL HISTORY:   Past Surgical History:  Procedure  Laterality Date  . ABDOMINAL HYSTERECTOMY     partial  . APPENDECTOMY    . COLONOSCOPY    . ESOPHAGOGASTRODUODENOSCOPY    . ESOPHAGOGASTRODUODENOSCOPY (EGD) WITH PROPOFOL N/A 07/04/2015   Procedure: ESOPHAGOGASTRODUODENOSCOPY (EGD) WITH PROPOFOL;  Surgeon: Manya Silvas, MD;  Location: [facility] ENDOSCOPY;  Service: Endoscopy;  Laterality: N/A;  . INTRAMEDULLARY (IM) NAIL INTERTROCHANTERIC Left 06/18/2016   Procedure: INTRAMEDULLARY (IM) NAIL INTERTROCHANTRIC;   Surgeon: Dereck Leep, MD;  Location: [facility] ORS;  Service: Orthopedics;  Laterality: Left;  . SHOULDER ARTHROSCOPY    . SIGMOIDOSCOPY      SOCIAL HISTORY:   Social History  Substance Use Topics  . Smoking status: Current Every Day Smoker    Packs/day: 1.00    Years: 60.00    Types: Cigarettes  . Smokeless tobacco: Never Used  . Alcohol use No    FAMILY HISTORY:   Family History  Problem Relation Age of Onset  . Diabetes Mother   . Diabetes Father     DRUG ALLERGIES:  No Known Allergies  REVIEW OF SYSTEMS:   Review of Systems  Constitutional: Negative for fever and weight loss.  HENT: Negative for congestion, nosebleeds and tinnitus.   Eyes: Negative for blurred vision, double vision and redness.  Respiratory: Positive for shortness of breath and wheezing. Negative for cough and hemoptysis.   Cardiovascular: Negative for chest pain, orthopnea, leg swelling and PND.  Gastrointestinal: Negative for abdominal pain, diarrhea, melena, nausea and vomiting.  Genitourinary: Negative for dysuria, hematuria and urgency.  Musculoskeletal: Negative for falls and joint pain.  Neurological: Negative for dizziness, tingling, sensory change, focal weakness, seizures, weakness and headaches.  Endo/Heme/Allergies: Negative for polydipsia. Does not bruise/bleed easily.  Psychiatric/Behavioral: Negative for depression and memory loss. The patient is not nervous/anxious.     MEDICATIONS AT HOME:   Prior to Admission medications   Medication Sig Start Date End Date Taking? Authorizing Provider  acetaminophen (TYLENOL) 500 MG tablet Take 500 mg by mouth every 4 (four) hours as needed.   Yes Historical Provider, MD  albuterol (PROVENTIL) (2.5 MG/3ML) 0.083% nebulizer solution Take 3 mLs (2.5 mg total) by nebulization every 4 (four) hours as needed for wheezing or shortness of breath. 11/26/15  Yes Megan P Johnson, DO  amiodarone (PACERONE) 100 MG tablet Take 100 mg by mouth daily.    Yes  Historical Provider, MD  amLODipine (NORVASC) 5 MG tablet Take 1 tablet (5 mg total) by mouth 2 (two) times daily. 11/26/15  Yes Megan P Johnson, DO  aspirin EC 81 MG tablet Take 81 mg by mouth daily.    Yes Historical Provider, MD  Cholecalciferol (VITAMIN D-3) 1000 units CAPS Take 1 capsule (1,000 Units total) by mouth daily. 11/26/15  Yes Megan P Johnson, DO  divalproex (DEPAKOTE) 250 MG DR tablet Take 1 tablet (250 mg total) by mouth 3 (three) times daily. 11/26/15  Yes Megan P Johnson, DO  donepezil (ARICEPT) 10 MG tablet Take 1 tablet (10 mg total) by mouth at bedtime. 11/26/15  Yes Megan P Johnson, DO  ferrous sulfate 325 (65 FE) MG tablet Take 1 tablet (325 mg total) by mouth 2 (two) times daily with a meal. 11/26/15  Yes Megan P Johnson, DO  Fluticasone-Salmeterol (ADVAIR) 250-50 MCG/DOSE AEPB Inhale 1 puff into the lungs 2 (two) times daily. 11/26/15  Yes Megan P Johnson, DO  furosemide (LASIX) 40 MG tablet Take 1 tablet (40 mg total) by mouth 2 (two) times daily. 11/26/15  Yes Megan P  Johnson, DO  gabapentin (NEURONTIN) 400 MG capsule TAKE 1 CAPSULE TWICE DAILY 07/14/16  Yes Volney American, PA-C  guaiFENesin (MUCINEX) 600 MG 12 hr tablet Take 1 tablet (600 mg total) by mouth 2 (two) times daily. 06/23/16  Yes Demetrios Loll, MD  guaiFENesin-dextromethorphan (ROBITUSSIN DM) 100-10 MG/5ML syrup Take 5 mLs by mouth every 4 (four) hours as needed for cough. 06/23/16  Yes Demetrios Loll, MD  levothyroxine (SYNTHROID, LEVOTHROID) 175 MCG tablet Take 175 mcg by mouth daily.   Yes Historical Provider, MD  lisinopril (PRINIVIL,ZESTRIL) 20 MG tablet Take 1 tablet (20 mg total) by mouth 2 (two) times daily. 11/26/15  Yes Megan P Johnson, DO  Melatonin 3 MG TABS Take 1 tablet (3 mg total) by mouth at bedtime. 05/28/15  Yes Megan P Johnson, DO  metFORMIN (GLUMETZA) 500 MG (MOD) 24 hr tablet Take 1 tablet (500 mg total) by mouth daily with breakfast. 11/26/15  Yes Megan P Johnson, DO  metoprolol (LOPRESSOR) 50 MG tablet Take  0.5 tablets (25 mg total) by mouth 2 (two) times daily. 11/26/15  Yes Megan P Johnson, DO  MICROLET LANCETS MISC TEST BLOOD GLUCOSE LEVEL ONCE DAILY 04/10/16  Yes Megan P Johnson, DO  pantoprazole (PROTONIX) 40 MG tablet Take 1 tablet (40 mg total) by mouth 2 (two) times daily. Patient taking differently: Take 40 mg by mouth daily.  11/26/15  Yes Megan P Johnson, DO  QUEtiapine (SEROQUEL) 25 MG tablet Take 1 tablet (25 mg total) by mouth at bedtime. 11/26/15  Yes Megan P Johnson, DO  senna-docusate (SENOKOT-S) 8.6-50 MG tablet Take 1 tablet by mouth at bedtime as needed for mild constipation. 06/23/16  Yes Demetrios Loll, MD  tiotropium (SPIRIVA) 18 MCG inhalation capsule Place 1 capsule (18 mcg total) into inhaler and inhale daily. 11/26/15  Yes Megan P Johnson, DO  venlafaxine XR (EFFEXOR XR) 37.5 MG 24 hr capsule Take 1 capsule (37.5 mg total) by mouth daily with breakfast. 11/26/15  Yes Megan P Johnson, DO  amoxicillin-clavulanate (AUGMENTIN) 875-125 MG tablet Take 1 tablet by mouth 2 (two) times daily. 06/23/16   Demetrios Loll, MD  enoxaparin (LOVENOX) 40 MG/0.4ML injection Inject 0.4 mLs (40 mg total) into the skin daily. Patient not taking: Reported on 07/19/2016 06/24/16   Demetrios Loll, MD  levothyroxine (SYNTHROID, LEVOTHROID) 150 MCG tablet TAKE ONE (1) [place] Patient not taking: Reported on 07/19/2016 07/14/16   Volney American, PA-C  metFORMIN (GLUCOPHAGE) 500 MG tablet TAKE ONE TABLET EVERY DAY WITH BREAKFAST Patient not taking: Reported on 07/19/2016 06/12/16   Megan P Johnson, DO  nitrofurantoin, macrocrystal-monohydrate, (MACROBID) 100 MG capsule Take 1 capsule (100 mg total) by mouth 2 (two) times daily. Patient not taking: Reported on 06/17/2016 06/08/16   Megan P Johnson, DO  oxyCODONE (OXY IR/ROXICODONE) 5 MG immediate release tablet Take 1 tablet (5 mg total) by mouth every 6 (six) hours as needed for breakthrough pain ((for MODERATE breakthrough pain)). Patient not  taking: Reported on 07/19/2016 06/23/16   Demetrios Loll, MD      VITAL SIGNS:  Blood pressure (!) 130/53, pulse 83, temperature 97.9 F (36.6 C), temperature source Axillary, resp. rate 18, height 5\' 2"  (1.575 m), weight 88.5 kg (195 lb), SpO2 98 %.  PHYSICAL EXAMINATION:  Physical Exam  GENERAL:  82 y.o.-year-old patient lying in the bed in mild resp. distress.  EYES: Pupils equal, round, reactive to light and accommodation. No scleral icterus. Extraocular muscles intact.  HEENT: Head atraumatic, normocephalic.  Oropharynx and nasopharynx clear. No oropharyngeal erythema, moist oral mucosa  NECK:  Supple, no jugular venous distention. No thyroid enlargement, no tenderness.  LUNGS: Normal breath sounds bilaterally, no wheezing, bibasilar rales, No rhonchi. No use of accessory muscles of respiration.  CARDIOVASCULAR: S1, S2 RRR. No murmurs, rubs, gallops, clicks.  ABDOMEN: Soft, nontender, nondistended. Bowel sounds present. No organomegaly or mass.  EXTREMITIES: No pedal edema, cyanosis, or clubbing. + 2 pedal & radial pulses b/l.  Left foot pressure ulcer.  NEUROLOGIC: Cranial nerves II through XII are intact. No focal Motor or sensory deficits appreciated b/l.  Globally weak.  PSYCHIATRIC: The patient is alert and oriented x 2.   SKIN: No obvious rash, lesion, or ulcer.   LABORATORY PANEL:   CBC  Recent Labs Lab 07/19/16 0645  WBC 14.7*  HGB 10.0*  HCT 30.5*  PLT 443*   ------------------------------------------------------------------------------------------------------------------  Chemistries   Recent Labs Lab 07/19/16 0645  NA 140  K 3.5  CL 101  CO2 31  GLUCOSE 223*  BUN 10  CREATININE 0.68  CALCIUM 8.3*   ------------------------------------------------------------------------------------------------------------------  Cardiac Enzymes  Recent Labs Lab 07/19/16 0645  TROPONINI <0.03    ------------------------------------------------------------------------------------------------------------------  RADIOLOGY:  Dg Chest Portable 1 View  Result Date: 07/19/2016 CLINICAL DATA:  Came here this am from [facility] with respiratory distress. Pt on CPAP machine. Hx of COPD, CHF, dementia, diabetes, HTN, and PE. Smoker. EXAM: PORTABLE CHEST 1 VIEW COMPARISON:  06/20/2016 FINDINGS: There are irregular interstitial and hazy airspace opacities throughout most of the right lung, most confluent at the right lung base. Milder interstitial and airspace opacities are noted at the left lung base. There is underlying prominence of the bronchovascular markings, stable. Probable small right pleural effusion.  No pneumothorax. Cardiac silhouette is mildly enlarged. No mediastinal or hilar masses. IMPRESSION: 1. Mixed interstitial and airspace opacities most evident in the right lung and most confluent at the right lung base with milder left lung base opacity. Findings may reflect asymmetric pulmonary edema or multifocal pneumonia. Electronically Signed   By: Lajean Manes M.D.   On: 07/19/2016 07:29     IMPRESSION AND PLAN:   82 year old female with past medical history of hypertension, hyperlipidemia, diabetes, peripheral vascular disease, history of CHF, COPD, dementia, hypothyroidism who presents to the hospital due to shortness of breath and hypoxia.  1. Acute on chronic respiratory failure with hypoxia-secondary to CHF. -Continue O2 supplementation, patient has been diuresed with some IV Lasix in the ER and has improved. -Was on BiPAP but now weaned off of it.  2. CHF-acute on chronic diastolic dysfunction. -We'll diurese with IV Lasix, follow I's and O's and daily weights. -Continue lisinopril, metoprolol.  3. History of atrial fibrillation-currently rate controlled. Continue amiodarone, continue metoprolol.  4. Essential hypertension-continue Norvasc, metoprolol,  lisinopril.  5. Hypothyroidism-continue Synthroid.  6. Diabetes type 2 without complication-hold metformin. Place on sliding scale insulin for now.  7. Diabetic neuropathy-continue gabapentin.  8. Dementia-continue Aricept, Depakote Thomas Seroquel.  8. COPD-no acute exacerbation-continue Spiriva, Dulera.    All the records are reviewed and case discussed with ED provider. Management plans discussed with the patient, family and they are in agreement.  CODE STATUS: Full code  TOTAL TIME TAKING CARE OF THIS PATIENT: 45 minutes.    Henreitta Leber M.D on 07/19/2016 at 11:33 AM  Between 7am to 6pm - Pager - [phone]  After 6pm go to [email or web address] - password [email or web address] [facility]  [facility]  Office  [phone]  CC: Primary  care physician; Park Liter, DO

## 2016-07-19 NOTE — ED Notes (Signed)
EMS gave 10 of albuterol, 125 solumedrol and 1 mg of imputrim.

## 2016-07-19 NOTE — ED Triage Notes (Signed)
Pt. Here from [facility] for respiratory distress.

## 2016-07-19 NOTE — Plan of Care (Signed)
Problem: Education: Goal: Knowledge of [facility] Education information/materials will improve Pt on chronic 2L O2 [place]

## 2016-07-19 NOTE — ED Notes (Signed)
Dr. Dahlia Client and Dr. Quentin Cornwall notified of lactic acid 2.4

## 2016-07-20 LAB — BASIC METABOLIC PANEL
Anion gap: 8 (ref 5–15)
BUN: 15 mg/dL (ref 6–20)
CO2: 32 mmol/L (ref 22–32)
CREATININE: 0.7 mg/dL (ref 0.44–1.00)
Calcium: 8 mg/dL — ABNORMAL LOW (ref 8.9–10.3)
Chloride: 100 mmol/L — ABNORMAL LOW (ref 101–111)
GFR calc Af Amer: >60 mL/min (ref >60)
Glucose, Bld: 154 mg/dL — ABNORMAL HIGH (ref 65–99)
POTASSIUM: 3.7 mmol/L (ref 3.5–5.1)
SODIUM: 140 mmol/L (ref 135–145)

## 2016-07-20 LAB — CBC
HCT: 24.7 % — ABNORMAL LOW (ref 35.0–47.0)
Hemoglobin: 8.3 g/dL — ABNORMAL LOW (ref 12.0–16.0)
MCH: 30.6 pg (ref 26.0–34.0)
MCHC: 33.7 g/dL (ref 32.0–36.0)
MCV: 90.8 fL (ref 80.0–100.0)
PLATELETS: 394 10*3/uL (ref 150–440)
RBC: 2.72 MIL/uL — ABNORMAL LOW (ref 3.80–5.20)
RDW: 17.9 % — AB (ref 11.5–14.5)
WBC: 8.5 10*3/uL (ref 3.6–11.0)

## 2016-07-20 LAB — GLUCOSE, CAPILLARY
GLUCOSE-CAPILLARY: 214 mg/dL — AB (ref 65–99)
GLUCOSE-CAPILLARY: 297 mg/dL — AB (ref 65–99)
Glucose-Capillary: 119 mg/dL — ABNORMAL HIGH (ref 65–99)
Glucose-Capillary: 368 mg/dL — ABNORMAL HIGH (ref 65–99)

## 2016-07-20 LAB — LACTIC ACID, PLASMA
LACTIC ACID, VENOUS: 2.6 mmol/L — AB (ref 0.5–1.9)
Lactic Acid, Venous: 2.5 mmol/L (ref 0.5–1.9)

## 2016-07-20 LAB — PROCALCITONIN: Procalcitonin: 0.12 ng/mL

## 2016-07-20 MED ORDER — PRO-STAT SUGAR FREE PO LIQD
30.0000 mL | Freq: Two times a day (BID) | ORAL | Status: DC
  Administered 2016-07-20 – 2016-07-23 (×7): 30 mL via ORAL

## 2016-07-20 MED ORDER — ALBUTEROL SULFATE (2.5 MG/3ML) 0.083% IN NEBU
2.5000 mg | INHALATION_SOLUTION | RESPIRATORY_TRACT | Status: DC
  Administered 2016-07-20 – 2016-07-21 (×3): 2.5 mg via RESPIRATORY_TRACT
  Filled 2016-07-20 (×3): qty 3

## 2016-07-20 MED ORDER — METHYLPREDNISOLONE SODIUM SUCC 125 MG IJ SOLR
60.0000 mg | INTRAMUSCULAR | Status: DC
  Administered 2016-07-20: 10:00:00 60 mg via INTRAVENOUS
  Filled 2016-07-20: qty 2

## 2016-07-20 MED ORDER — LISINOPRIL 5 MG PO TABS
5.0000 mg | ORAL_TABLET | Freq: Two times a day (BID) | ORAL | Status: DC
Start: 2016-07-20 — End: 2016-07-24
  Administered 2016-07-20 – 2016-07-23 (×6): 5 mg via ORAL
  Filled 2016-07-20 (×7): qty 1

## 2016-07-20 MED ORDER — PIPERACILLIN-TAZOBACTAM 3.375 G IVPB
3.3750 g | Freq: Three times a day (TID) | INTRAVENOUS | Status: DC
  Administered 2016-07-20 – 2016-07-22 (×6): 3.375 g via INTRAVENOUS
  Filled 2016-07-20 (×7): qty 50

## 2016-07-20 MED ORDER — METHYLPREDNISOLONE SODIUM SUCC 125 MG IJ SOLR
60.0000 mg | Freq: Two times a day (BID) | INTRAMUSCULAR | Status: DC
  Administered 2016-07-20 – 2016-07-23 (×6): 60 mg via INTRAVENOUS
  Filled 2016-07-20 (×6): qty 2

## 2016-07-20 NOTE — Care Management (Signed)
Order present for CM assessment as patient was to have discharge from [facility] to  home on the day of this admission.  She will require home health services.  She is admitted to [facility] with exacerbation of CHF with mild exacerbation of copd.  There is a PT consult pending.  Unsure if patient will require return to skilled nursing level of care.  She has chronic 02 in the home with [facility].  If requires home health- agency preference would be [facility]. Would anticipate need for [facility] PT OT Aide

## 2016-07-20 NOTE — Progress Notes (Signed)
Made Dr. Vianne Bulls aware that pt's O2 dropped from 93% on chronis 2L to 79%, pt O2 bumped up to 4.5L with pt at 91%.  Pt denies SOB but breathing is somewhat labored at times.  Also made Dr. Vianne Bulls aware that pt's lactic acid is now 2.6.  New orders to change breathing treatment to scheduled and will add antiboitics.  Respiratory in room assessing pt.  Will continue to monitor.  Clarise Cruz, RN

## 2016-07-20 NOTE — Progress Notes (Signed)
Pharmacy Antibiotic Note  Theresa Vasquez is a 83 y.o. female admitted on 07/19/2016 with aspiration pneumonia.  Pharmacy has been consulted for Zosyn dosing.  Plan: Zosyn 3.375g IV q8h (4 hour infusion).  Height: 5\' 2"  (157.5 cm) Weight: 195 lb (88.5 kg) IBW/kg (Calculated) : 50.1  Temp (24hrs), Avg:98.9 F (37.2 C), Min:98 F (36.7 C), Max:100 F (37.8 C)   Recent Labs Lab 07/19/16 0645 07/19/16 0800 07/19/16 1046 07/20/16 0409 07/20/16 0857 07/20/16 1124  WBC 14.7*  --   --  8.5  --   --   CREATININE 0.68  --   --  0.70  --   --   LATICACIDVEN  --  2.4* 3.0*  --  2.5* 2.6*    Estimated Creatinine Clearance: 55.1 mL/min (by C-G formula based on SCr of 0.7 mg/dL).    No Known Allergies  Antimicrobials this admission: Zosyn 1/1 >> Vancomycin 12/31 >>12/31   Microbiology results: 12/31 BCx: NGTD 12/31 MRSA PCR: negative  Thank you for allowing pharmacy to be a part of this patient's care.  Loree Fee, PharmD 07/20/2016 2:45 PM

## 2016-07-20 NOTE — Progress Notes (Signed)
[facility] [place] NAME: Theresa Vasquez    MR#:  [id number]  DATE OF BIRTH: 81 Dec 17, 1930  SUBJECTIVE: Admitted for CHF exacerbation, patient still has a little wheezing, elevated lactic acid. Received a 1 dose of vancomycin, cefipime  in the emergency emergency room. Still has some wheezing this morning. And having cough while eating.   CHIEF COMPLAINT:   Chief Complaint  Patient presents with  . Shortness of Breath    Pt. here via EMS from [facility] for respirtory distress.    REVIEW OF SYSTEMS:   ROS CONSTITUTIONAL: No fever, fatigue or weakness.  EYES: No blurred or double vision.  EARS, NOSE, AND THROAT: No tinnitus or ear pain.  RESPIRATORY: Cough, shortness of breath, wheezing . CARDIOVASCULAR: No chest pain, orthopnea, edema.  GASTROINTESTINAL: No nausea, vomiting, diarrhea or abdominal pain.  GENITOURINARY: No dysuria, hematuria.  ENDOCRINE: No polyuria, nocturia,  HEMATOLOGY: No anemia, easy bruising or bleeding SKIN: No rash or lesion. MUSCULOSKELETAL: No joint pain or arthritis.   NEUROLOGIC: No tingling, numbness, weakness.  PSYCHIATRY: No anxiety or depression.   DRUG ALLERGIES:  No Known Allergies  VITALS:  Blood pressure (!) 120/35, pulse (!) 58, temperature 98 F (36.7 C), temperature source Oral, resp. rate 20, height 5\' 2"  (1.575 m), weight 88.5 kg (195 lb), SpO2 95 %.  PHYSICAL EXAMINATION:  GENERAL:  83 y.o.-year-old patient lying in the bed with no acute distress.  EYES: Pupils equal, round, reactive to light and accommodation. No scleral icterus. Extraocular muscles intact.  HEENT: Head atraumatic, normocephalic. Oropharynx and nasopharynx clear.  NECK:  Supple, no jugular venous distention. No thyroid enlargement, no tenderness.  LUNGS: Patient has a faint expiratory wheeze in all lung fields. CARDIOVASCULAR: S1, S2 normal. No murmurs, rubs, or gallops.  ABDOMEN: Soft, nontender, nondistended.  Bowel sounds present. No organomegaly or mass.  EXTREMITIES: No pedal edema, cyanosis, or clubbing.  NEUROLOGIC: Cranial nerves II through XII are intact. Muscle strength 5/5 in all extremities. Sensation intact. Gait not checked.  PSYCHIATRIC: The patient is alert and oriented x 3.  SKIN: Patient has left heel boot.  LABORATORY PANEL:   CBC  Recent Labs Lab 07/20/16 0409  WBC 8.5  HGB 8.3*  HCT 24.7*  PLT 394   ------------------------------------------------------------------------------------------------------------------  Chemistries   Recent Labs Lab 07/20/16 0409  NA 140  K 3.7  CL 100*  CO2 32  GLUCOSE 154*  BUN 15  CREATININE 0.70  CALCIUM 8.0*   ------------------------------------------------------------------------------------------------------------------  Cardiac Enzymes  Recent Labs Lab 07/19/16 0645  TROPONINI <0.03   ------------------------------------------------------------------------------------------------------------------  RADIOLOGY:  Dg Chest Portable 1 View  Result Date: 07/19/2016 CLINICAL DATA:  Came here this am from [facility] with respiratory distress. Pt on CPAP machine. Hx of COPD, CHF, dementia, diabetes, HTN, and PE. Smoker. EXAM: PORTABLE CHEST 1 VIEW COMPARISON:  06/20/2016 FINDINGS: There are irregular interstitial and hazy airspace opacities throughout most of the right lung, most confluent at the right lung base. Milder interstitial and airspace opacities are noted at the left lung base. There is underlying prominence of the bronchovascular markings, stable. Probable small right pleural effusion.  No pneumothorax. Cardiac silhouette is mildly enlarged. No mediastinal or hilar masses. IMPRESSION: 1. Mixed interstitial and airspace opacities most evident in the right lung and most confluent at the right lung base with milder left lung base opacity. Findings may reflect asymmetric pulmonary edema or multifocal pneumonia.  Electronically Signed   By: Lajean Manes  M.D.   On: 07/19/2016 07:29    EKG:   Orders placed or performed during the hospital encounter of 07/19/16  . EKG 12-Lead  . EKG 12-Lead    ASSESSMENT AND PLAN:   #1 acute on chronic diastolic heart failure: Continue IV Lasix, or pressure is borderline, adjusted  to the use of lisinopril, hold metoprolol, amiodarone today.   #2 the fibrillation with bradycardia: Hold the beta blockers, IV amiodarone for today.   #3 elevated lactic acid but WBC normal. Received 1 dose of antibiotics in the emergency room, repeat lactic acid level, check pro-calcitonin levels, at that time review the need for antibiotics.  #4 recent hip fracture, patient still not out of bed yet. Physical therapy was consulted.  5.Cough while eating: Get speech therapy consult to evaluate for dysphagia.  6.Vascular Dementia  7 diabetes mellitus type 2; on SSI with coverage, restart metformin. #8. COPD with some wheezing: Add IV steroids, Spiriva,, Dulera.  CODE STATUS full code   All the records are reviewed and case discussed with Care Management/Social Workerr. Management plans discussed with the patient, family and they are in agreement.  CODE STATUS: full  TOTAL TIME TAKING CARE OF THIS PATIENT: 35 minutes.   POSSIBLE D/C IN 2-3DAYS, DEPENDING ON CLINICAL CONDITION.   Epifanio Lesches M.D on 07/20/2016 at 8:26 AM  Between 7am to 6pm - Pager - [phone]  After 6pm go to [email or web address] - password [email or web address] [facility]  Office  [phone]  CC: Primary care physician; Park Liter, DO   Note: This dictation was prepared with Dragon dictation along with smaller phrase technology. Any transcriptional errors that result from this process are unintentional.

## 2016-07-20 NOTE — Evaluation (Signed)
Physical Therapy Evaluation Patient Details Name: Theresa Vasquez MRN: [id number] DOB: 1930-09-14 Today's Date: 07/20/2016   History of Present Illness  Pt is an 83 y.o. female presenting from [facility] (was at [facility] for hip fx s/p L IMN 06/18/16; WBAT L LE) and on day pt was supposed to go home pt with SOB/respiratory distress.  Pt admitted to hospital with acute on chronic respiratory failure with hypoxia secondary to CHF.  PMH includes s/p L IMN 06/18/16, dementia, a-fib with RVR, CHF, COPD, DM, and htn.  Clinical Impression  Prior to hospital admission, pt was walking short distances at STR with walker (was about to discharge home with 24/7 caregiver support).  Pt lives with a caregiver in a 1 level home with ramp to enter.  Currently pt is requiring 1-2 assist with bed mobility (limited activity d/t O2 desaturation).  Pt's O2 initially 79% via finger and 87-88% via ear-lobe on 2 L O2 via nasal cannula (semi-supine in bed).  Discussed with nursing who cleared PT to increase pt's O2 to 3 L and pt's O2 increased to 93% via ear-lobe.  Pt assisted to sitting on edge of bed and pt's O2 decreased to 79% via finger and 87-88% via ear-lobe.  Nursing present and increased O2 and pt assisted back to bed.  Nursing present end of session and O2 increased to 91% on finger via 4.5 L supplemental O2 via nasal cannula.  Pt would benefit from skilled PT to address noted impairments and functional limitations.  Recommend pt discharge to STR when medically appropriate.    Follow Up Recommendations SNF    Equipment Recommendations  Rolling walker with 5" wheels    Recommendations for Other Services       Precautions / Restrictions Precautions Precautions: Fall Restrictions Weight Bearing Restrictions: Yes LLE Weight Bearing: Weight bearing as tolerated      Mobility  Bed Mobility Overal bed mobility: Needs Assistance Bed Mobility: Supine to Sit;Sit to Supine     Supine to sit: Mod assist;Max  assist (assist for trunk and B LE's) Sit to supine: Min assist;Mod assist;+2 for physical assistance (assist for trunk and B LE's)   General bed mobility comments: vc's for technique and use of side rail; increased effort and time to go supine to sit; 2 assist to boost up in bed  Transfers                 General transfer comment: Deferred d/t O2 desaturation concerns  Ambulation/Gait             General Gait Details: Deferred d/t O2 desaturation concerns  Stairs            Wheelchair Mobility    Modified Rankin (Stroke Patients Only)       Balance Overall balance assessment: Needs assistance;History of Falls Sitting-balance support: Bilateral upper extremity supported;Feet supported Sitting balance-Leahy Scale: Fair         Standing balance comment: Deferred standing d/t O2 desaturation concerns                             Pertinent Vitals/Pain Pain Assessment: No/denies pain    Home Living Family/patient expects to be discharged to:: Skilled nursing facility Living Arrangements: Non-relatives/Friends (Lives with caregiver) Available Help at Discharge: Personal care attendant;Available 24 hours/day Type of Home: House Home Access: Ramped entrance     Home Layout: One level Home Equipment: Walker - 4 wheels;Cane - single point;Hand  held shower head;Grab bars - tub/shower;Shower seat;Bedside commode (lift chair)      Prior Function Level of Independence: Needs assistance   Gait / Transfers Assistance Needed: Ambulatory limited household distances with rollator (occasionally requiring physical assist) prior to hip fx surgery end of November); nursing reports family reported pt was walking up to 50 feet with walker this weekend.  ADL's / Homemaking Assistance Needed: 24 hour caregiver assist with ADLs, and IADLs.  Comments: Prior to hip surgery used 2 L home O2 at night but since surgery pt's son reports pt has never gotton off O2 during  the day.     Hand Dominance   Dominant Hand: Right    Extremity/Trunk Assessment   Upper Extremity Assessment Upper Extremity Assessment: Generalized weakness    Lower Extremity Assessment Lower Extremity Assessment: Generalized weakness;LLE deficits/detail LLE Deficits / Details: hip flexion 3-/5; knee flexion/extension at least 3/5; DF at least 3/5       Communication   Communication: HOH  Cognition Arousal/Alertness: Awake/alert Behavior During Therapy: WFL for tasks assessed/performed Overall Cognitive Status: History of cognitive impairments - at baseline                 General Comments: [name] present during session.    General Comments   Nursing cleared pt for participation in physical therapy.  Pt agreeable to PT session.    Exercises  Pt requiring vc's for pursed lip breathing during session to increase O2 sats.   Assessment/Plan    PT Assessment Patient needs continued PT services  PT Problem List Decreased strength;Decreased activity tolerance;Decreased balance;Decreased mobility          PT Treatment Interventions DME instruction;Gait training;Functional mobility training;Therapeutic activities;Therapeutic exercise;Balance training;Patient/family education    PT Goals (Current goals can be found in the Care Plan section)  Acute Rehab PT Goals Patient Stated Goal: to be able to walk again PT Goal Formulation: With patient/family Time For Goal Achievement: 08/03/16 Potential to Achieve Goals: Good    Frequency Min 2X/week   Barriers to discharge Decreased caregiver support level of assist    Co-evaluation               End of Session Equipment Utilized During Treatment: Oxygen Activity Tolerance: Patient limited by fatigue;Other (comment) (Limited d/t O2 desaturation) Patient left: in bed;with call bell/phone within reach;with bed alarm set;with nursing/sitter in room;with family/visitor present (L prevalon boot donned) Nurse  Communication: Mobility status;Precautions (O2 sats)         Time: [id number] PT Time Calculation (min) (ACUTE ONLY): 30 min   Charges:   PT Evaluation $PT Eval Low Complexity: 1 Procedure PT Treatments $Therapeutic Exercise: 8-22 mins   PT G CodesLeitha Bleak 08-03-16, 1:21 PM [name], [place]

## 2016-07-20 NOTE — Progress Notes (Signed)
Made Dr. Vianne Bulls aware of productive cough and that pt's sputum is pink tinged. No new orders.  Clarise Cruz, RN

## 2016-07-20 NOTE — Progress Notes (Signed)
Dr. Vianne Bulls on unit, made aware of pt's current lactic acid of 2.5.  Was 3 on admission.  No new orders.  Clarise Cruz, RN

## 2016-07-21 ENCOUNTER — Inpatient Hospital Stay: Payer: Medicare Other

## 2016-07-21 DIAGNOSIS — L8990 Pressure ulcer of unspecified site, unspecified stage: Secondary | ICD-10-CM | POA: Insufficient documentation

## 2016-07-21 LAB — GLUCOSE, CAPILLARY
GLUCOSE-CAPILLARY: 256 mg/dL — AB (ref 65–99)
Glucose-Capillary: 190 mg/dL — ABNORMAL HIGH (ref 65–99)
Glucose-Capillary: 300 mg/dL — ABNORMAL HIGH (ref 65–99)
Glucose-Capillary: 297 mg/dL — ABNORMAL HIGH (ref 65–99)

## 2016-07-21 MED ORDER — ALBUTEROL SULFATE (2.5 MG/3ML) 0.083% IN NEBU
2.5000 mg | INHALATION_SOLUTION | Freq: Four times a day (QID) | RESPIRATORY_TRACT | Status: DC
  Administered 2016-07-21 – 2016-07-23 (×6): 2.5 mg via RESPIRATORY_TRACT
  Filled 2016-07-21 (×7): qty 3

## 2016-07-21 MED ORDER — INSULIN ASPART 100 UNIT/ML ~~LOC~~ SOLN
4.0000 [IU] | Freq: Three times a day (TID) | SUBCUTANEOUS | Status: DC
  Administered 2016-07-21 – 2016-07-23 (×7): 4 [IU] via SUBCUTANEOUS
  Filled 2016-07-21 (×5): qty 4

## 2016-07-21 NOTE — Progress Notes (Signed)
Heart Failure Clinic appointment on August 03, 2016 at 11:00am with Darylene Price, [place]. Please call [phone] to reschedule.

## 2016-07-21 NOTE — Progress Notes (Signed)
[place] [facility] [place] NAME: Theresa Vasquez    MR#:  [id number]  DATE OF BIRTH:  Mar 17, 1931  SUBJECTIVE: Admitted for CHF, COPD exacerbation. Patient started on IV Lasix, IV steroids, . had hypoxia yesterday requiring  4 litres of  of oxygen. Today patient feels better, oxygenating better, no down to 2 L of oxygen.   CHIEF COMPLAINT:   Chief Complaint  Patient presents with  . Shortness of Breath    Pt. here via EMS from [facility] for respirtory distress.    REVIEW OF SYSTEMS:   ROS CONSTITUTIONAL: No fever, fatigue or weakness.  EYES: No blurred or double vision.  EARS, NOSE, AND THROAT: No tinnitus or ear pain.  RESPIRATORY: Cough,   . CARDIOVASCULAR: No chest pain, orthopnea, edema.  GASTROINTESTINAL: No nausea, vomiting, diarrhea or abdominal pain.  GENITOURINARY: No dysuria, hematuria.  ENDOCRINE: No polyuria, nocturia,  HEMATOLOGY: No anemia, easy bruising or bleeding SKIN: No rash or lesion. MUSCULOSKELETAL: No joint pain or arthritis.   NEUROLOGIC: No tingling, numbness, weakness.  PSYCHIATRY: No anxiety or depression.   DRUG ALLERGIES:  No Known Allergies  VITALS:  Blood pressure (!) 135/46, pulse 65, temperature 97.8 F (36.6 C), temperature source Oral, resp. rate 20, height 5\' 2"  (1.575 m), weight 88.5 kg (195 lb), SpO2 95 %.  PHYSICAL EXAMINATION:  GENERAL:  83 y.o.-year-old patient lying in the bed with no acute distress.  EYES: Pupils equal, round, reactive to light and accommodation. No scleral icterus. Extraocular muscles intact.  HEENT: Head atraumatic, normocephalic. Oropharynx and nasopharynx clear.  NECK:  Supple, no jugular venous distention. No thyroid enlargement, no tenderness.  LUNGS: Mostly clear to auscultation. CARDIOVASCULAR: S1, S2 normal. No murmurs, rubs, or gallops.  ABDOMEN: Soft, nontender, nondistended. Bowel sounds present. No organomegaly or mass.  EXTREMITIES: No pedal edema,  cyanosis, or clubbing.  NEUROLOGIC: Cranial nerves II through XII are intact. Muscle strength 5/5 in all extremities. Sensation intact. Gait not checked.  PSYCHIATRIC: The patient is alert and oriented x 3.  SKIN: Patient has left heel boot.  LABORATORY PANEL:    CBC  Recent Labs Lab 07/20/16 0409  WBC 8.5  HGB 8.3*  HCT 24.7*  PLT 394   ------------------------------------------------------------------------------------------------------------------  Chemistries   Recent Labs Lab 07/20/16 0409  NA 140  K 3.7  CL 100*  CO2 32  GLUCOSE 154*  BUN 15  CREATININE 0.70  CALCIUM 8.0*   ------------------------------------------------------------------------------------------------------------------  Cardiac Enzymes  Recent Labs Lab 07/19/16 0645  TROPONINI <0.03   ------------------------------------------------------------------------------------------------------------------  RADIOLOGY:  US Venous Img Lower Unilateral Left  Result Date: 07/21/2016 CLINICAL DATA:  Left lower extremity edema for 1 month. EXAM: Left LOWER EXTREMITY VENOUS DOPPLER ULTRASOUND TECHNIQUE: Gray-scale sonography with graded compression, as well as color Doppler and duplex ultrasound were performed to evaluate the lower extremity deep venous systems from the level of the common femoral vein and including the common femoral, femoral, profunda femoral, popliteal and calf veins including the posterior tibial, peroneal and gastrocnemius veins when visible. The superficial great saphenous vein was also interrogated. Spectral Doppler was utilized to evaluate flow at rest and with distal augmentation maneuvers in the common femoral, femoral and popliteal veins. COMPARISON:  None. FINDINGS: Contralateral Common Femoral Vein: Respiratory phasicity is normal and symmetric with the symptomatic side. No evidence of thrombus. Normal compressibility. Common Femoral Vein: No evidence of thrombus. Normal  compressibility, respiratory phasicity and response to augmentation. Saphenofemoral Junction: No evidence of thrombus. Normal  compressibility and flow on color Doppler imaging. Profunda Femoral Vein: No evidence of thrombus. Normal compressibility and flow on color Doppler imaging. Femoral Vein: No evidence of thrombus. Normal compressibility, respiratory phasicity and response to augmentation. Popliteal Vein: No evidence of thrombus. Normal compressibility, respiratory phasicity and response to augmentation. Calf Veins: No evidence of thrombus. Normal compressibility and flow on color Doppler imaging. Superficial Great Saphenous Vein: No evidence of thrombus. Normal compressibility and flow on color Doppler imaging. Venous Reflux:  None. Other Findings:  None. IMPRESSION: No evidence of deep venous thrombosis seen in left lower extremity. Electronically Signed   By: Marijo Conception, M.D.   On: 07/21/2016 10:45   Dg Foot 2 Views Left  Result Date: 07/21/2016 CLINICAL DATA:  Dorsal left foot pain. EXAM: LEFT FOOT - 2 VIEW COMPARISON:  None. FINDINGS: There is diffuse osteopenia with diffuse narrowing of the IP joints and of the MTP joints throughout the foot. There is bunion formation on the head of the first metatarsal. There is also diffuse narrowing of the joints in the midfoot with prominent dorsal osteophytes at the talonavicular joint with severe arthritis at that joint. No fracture or bone destruction. Slight arthritis of the ankle joint. IMPRESSION: Severe arthritis of the talonavicular joint. Diffuse arthritis throughout the remainder of the foot as described. Osteopenia. Electronically Signed   By: Lorriane Shire M.D.   On: 07/21/2016 10:27    EKG:   Orders placed or performed during the hospital encounter of 07/19/16  . EKG 12-Lead  . EKG 12-Lead    ASSESSMENT AND PLAN:   #1 acute on chronic diastolic heart failure: Continue IV Lasix for today, changed to oral form tomorrow.  #2 atrial  fibrillation with bradycardia: Bradycardia resolved. Resume beta blockers, amiodarone at lower dose.  #3 sepsis likely due to bronchitis: On IV antibiotics, continue strict aspiration precautions. Elevated pro-calcitonin, elevated lactic acid. Started on IV Zosyn yesterday.  #4 recent hip fracture, patient still not out of bed yet. Continue physical therapy. Left swelling negative for hematoma, DVT. Left foot x-ray showed osteoarthritis but no fractures. Deep tissue  Wound;for the left foot seen by wound care.  5.Cough while eating: Consulted speech therapy.   6.Vascular Dementia  7 diabetes mellitus type 2; on SSI with coverage, restarted  Metformin  Add novolog 4 untits tid due to hyperglycemia,.   #8. COPD with some wheezing: Add IV steroids, Spiriva,, Dulera.  CODE STATUS full code D/w son.  All the records are reviewed and case discussed with Care Management/Social Workerr. Management plans discussed with the patient, family and they are in agreement.  CODE STATUS: full  TOTAL TIME TAKING CARE OF THIS PATIENT: 35 minutes.   POSSIBLE D/C IN 2-3DAYS, DEPENDING ON CLINICAL CONDITION.   Epifanio Lesches M.D on 07/21/2016 at 2:28 PM  Between 7am to 6pm - Pager - [phone]  After 6pm go to [email or web address] - password [email or web address] [facility]  Office  [phone]  CC: Primary care physician; Park Liter, DO   Note: This dictation was prepared with Dragon dictation along with smaller phrase technology. Any transcriptional errors that result from this process are unintentional.

## 2016-07-21 NOTE — Clinical Social Work Note (Signed)
CSW received consult that patient is from [facility] of [place], CSW to meet with patient to discuss returning back to [facility] at a later time.  Jones Broom. Nelma Phagan, MSW, [place]  Mon-Fri 8a-4:30p 07/21/2016 6:00 PM

## 2016-07-21 NOTE — Consult Note (Signed)
[place] Nurse wound consult note Reason for Consult: Deep tissue injury to left heel.  Resolving.   Wound type:Deep tissue injury.   Pressure Ulcer POA: Yes Measurement:4 cm x 5 cm maroon discoloration. Resolving blood filled blister.   Wound [id number] discoloration Drainage (amount, consistency, odor) None at this time.  Daughter states this was punctured and drained. Is dry and resolving.  Periwound:Dry skin Pain to left dorsal foot.  Noted deformity.  No bruising  Recommended X ray to rule our acute injury vs chronic changes.  will await MD order.  Dressing procedure/placement/frequency:Cleanse left heel with soap and water.  Silicone border foam heel dressing.  Change every 3 days and PRN Soilage.  Will not follow at this time.  Please re-consult if needed.  Domenic Moras RN BSN [place] Pager [phone]

## 2016-07-21 NOTE — Discharge Instructions (Signed)
Heart Failure Clinic appointment on August 03, 2016 at 11:00am with Darylene Price, [place]. Please call [phone] to reschedule.

## 2016-07-21 NOTE — Progress Notes (Signed)
Inpatient Diabetes Program Recommendations  AACE/ADA: New Consensus Statement on Inpatient Glycemic Control (2015)  Target Ranges:  Prepandial:   less than 140 mg/dL      [facility] postprandial:   less than 180 mg/dL (1-2 hours)      Critically ill patients:  140 - 180 mg/dL   Results for JOSUE, SANFRATELLO (MRN [id number]) as of 07/21/2016 08:24  Ref. Range 07/20/2016 07:54 07/20/2016 12:03 07/20/2016 16:35 07/20/2016 21:22  Glucose-Capillary Latest Ref Range: 65 - 99 mg/dL 119 (H) 214 (H) 368 (H) 297 (H)   Results for TONDALAYA, BELEW (MRN [id number]) as of 07/21/2016 08:24  Ref. Range 07/21/2016 07:48  Glucose-Capillary Latest Ref Range: 65 - 99 mg/dL 190 (H)    Admit with: SOB  History: DM, COPD, CHF, Dementia  Home DM Meds: Glumetza 500 mg daily  Current Insulin Orders: Novolog Sensitive Correction Scale/ SSI (0-9 units) TID AC + HS       MD- Note Solumedrol 60 mg BID started yesterday at 10am.  Glucose levels elevated since initiation of IV steroids.  Please consider the following in-hospital insulin adjustments while patient getting steroids:   1. Start low dose basal insulin: Lantus 9 units daily (0.1 units/kg dosing based on weight of 88kg)  2. Start Novolog Meal Coverage: Novolog 4 units TID with meals (hold if pt eats <50% of meal)      --Will follow patient during hospitalization--  Wyn Quaker RN, MSN, CDE Diabetes Coordinator Inpatient Glycemic Control Team Team Pager: [phone] (8a-5p)

## 2016-07-21 NOTE — Evaluation (Addendum)
Clinical/Bedside Swallow Evaluation Patient Details  Name: Theresa Vasquez MRN: [id number] Date of Birth: Apr 23, 1931  Today's Date: 07/21/2016 Time: SLP Start Time (ACUTE ONLY): 0820 SLP Stop Time (ACUTE ONLY): 0920 SLP Time Calculation (min) (ACUTE ONLY): 60 min  Past Medical History:  Past Medical History:  Diagnosis Date  . Adenomatous polyps   . Age-related osteoporosis without current pathological fracture   . Anemia   . Anxiety   . Atherosclerosis of aorta ([place])   . Atrial fibrillation with RVR ([place])   . Barrett's esophagus without dysplasia   . CHF (congestive heart failure) ([place])   . Colitis   . COPD (chronic obstructive pulmonary disease) ([place])   . Dementia   . Depression   . Diabetes mellitus type II, uncontrolled ([place])   . Diverticulitis   . Diverticulosis of intestine without perforation or abscess without bleeding   . Edema   . Essential hemorrhagic thrombocythemia ([place])   . Gastrointestinal hemorrhage   . High anion gap metabolic acidosis   . Hyperlipidemia   . Hypertension   . Hypertensive chronic kidney disease with stage 1 through stage 4 chronic kidney disease, or unspecified chronic kidney disease   . Hypothyroidism   . Hypoxemia   . Iron deficiency anemia   . Lactic acidosis   . Left bundle branch block   . Nonrheumatic mitral valve insufficiency   . Osteoarthritis   . Other psychoactive substance dependence, in remission ([place])   . Other specified symptoms and signs involving the circulatory and respiratory systems   . Paroxysmal atrial fibrillation (HCC)    s/p cardioversion  . Personal history of peptic ulcer disease   . Pulmonary edema   . PVD (peripheral vascular disease) ([place]) 05/14/2015  . Recurrent pneumonia    history of  . Respiratory failure with hypoxia and hypercapnia ([place])   . Type 2 diabetes mellitus with diabetic chronic kidney disease ([place])   . Type 2 diabetes mellitus with diabetic polyneuropathy ([place])   . Vascular dementia  without behavioral disturbance   . Vascular disorder of intestine [facility])    Past Surgical History:  Past Surgical History:  Procedure Laterality Date  . ABDOMINAL HYSTERECTOMY     partial  . APPENDECTOMY    . COLONOSCOPY    . ESOPHAGOGASTRODUODENOSCOPY    . ESOPHAGOGASTRODUODENOSCOPY (EGD) WITH PROPOFOL N/A 07/04/2015   Procedure: ESOPHAGOGASTRODUODENOSCOPY (EGD) WITH PROPOFOL;  Surgeon: Manya Silvas, MD;  Location: [facility] ENDOSCOPY;  Service: Endoscopy;  Laterality: N/A;  . INTRAMEDULLARY (IM) NAIL INTERTROCHANTERIC Left 06/18/2016   Procedure: INTRAMEDULLARY (IM) NAIL INTERTROCHANTRIC;  Surgeon: Dereck Leep, MD;  Location: [facility] ORS;  Service: Orthopedics;  Laterality: Left;  . SHOULDER ARTHROSCOPY    . SIGMOIDOSCOPY     HPI:  Pt is a 83 y.o. female with an extensive medical history including CHF, COPD, Barrett's Esophagus, Dementia, depression, diabetes type 2 without complication, history of diverticulosis, history of hypothyroidism, peripheral vascular disease who was just discharged a few weeks ago to a skilled nursing facility after having a hip fracture now returns back due to shortness of breath and hypoxia. Patient herself is a poor historian therefore most of the history obtained from the son at bedside and also from the chart. Patient apparently was noted by the nursing staff at the skilled nursing facility to be short of breath and having significant wheezing bilaterally and noted to be hypoxic with O2 sats in the 60s. She was transferred to the emergency room. She was given some IV Solu-Medrol,  nebulizer treatments, Lasix and has clinically improved since then. She was initially on BiPAP but has been weaned off of it now. Her chest x-ray findings are suggestive of interstitial edema and possible pneumonia. Caregiver denied any noted overt s/s of dysphagia/aspiration w/ meals and medications at home/SNF that she was aware of. NSG reported pt's breathing has improved and no issues  are noted when swallowing pills w/ water. Caregiver stated pt has been given multiple pills at one time to swallow at the SNF even though it was verbalized that pt does best w/ only 1-3 pills to swallow at one time.    Assessment / Plan / Recommendation Clinical Impression  Pt appears at reduced risk for aspiration following general aspiration precautions. Pt consumed trials of thin liquids via cup and straw - pt swallowed several pills w/ water sipping via straw; a delayed cough noted post final sip/swallow of pills and belching - unsure if the belching aided in eliciting the cough. Pt does have Barrett's Esophagus baseline. Pt consumed po trials from breakfast meal w/ no overt s/s of aspiration noted. Pt fed self w/ setup support d/t shaky UEs. Educated caregiver on use of closed (lid) cup w/ small opening vs straw drinking if any increased coughing noted when drinking via straws. Education given on general aspiration precautions including use of applesauce when swallowing pills if necessary. Encouraged an Chiropodist for increased Pulmonary strength/support; MD ordered. No further skilled ST services indicated at this time; NSG to reconsult if any change in status while admitted.   Education was given to Caregiver and pt to slow down when eating and take rest breaks occasionally to avoid any increased WOB/SOB while eating/drinking. Explained that pt's (baseline) declined Pulmonary status of COPD and CHF can significantly impact pt's stamina and safety during oral intake. Also explained the different of straw drinking vs cup drinking.     Aspiration Risk   (reduced following precautions)    Diet Recommendation  Dysphagia 3(mech soft/chopped meats); Thin liquids. General aspiration precautions; Reflux precautions.   Medication Administration: Whole meds with liquid (w/ use of Applesauce if necessary for safer swallowing)    Other  Recommendations Recommended Consults:  (Dietician f/u) Oral  Care Recommendations: Oral care BID;Staff/trained caregiver to provide oral care   Follow up Recommendations None      Frequency and Duration            Prognosis Prognosis for Safe Diet Advancement: Good      Swallow Study   General Date of Onset: 07/19/16 HPI: Pt is a 83 y.o. female with an extensive medical history including CHF, COPD, Barrett's Esophagus, Dementia, depression, diabetes type 2 without complication, history of diverticulosis, history of hypothyroidism, peripheral vascular disease who was just discharged a few weeks ago to a skilled nursing facility after having a hip fracture now returns back due to shortness of breath and hypoxia. Patient herself is a poor historian therefore most of the history obtained from the son at bedside and also from the chart. Patient apparently was noted by the nursing staff at the skilled nursing facility to be short of breath and having significant wheezing bilaterally and noted to be hypoxic with O2 sats in the 60s. She was transferred to the emergency room. She was given some IV Solu-Medrol, nebulizer treatments, Lasix and has clinically improved since then. She was initially on BiPAP but has been weaned off of it now. Her chest x-ray findings are suggestive of interstitial edema and possible pneumonia. Caregiver denied  any noted overt s/s of dysphagia/aspiration w/ meals and medications at home/SNF that she was aware of. NSG reported pt's breathing has improved and no issues are noted when swallowing pills w/ water. Caregiver stated pt has been given multiple pills at one time to swallow at the SNF even though it was verbalized that pt does best w/ only 1-3 pills to swallow at one time.  Type of Study: Bedside Swallow Evaluation Previous Swallow Assessment: none Diet Prior to this Study: Regular;Thin liquids Temperature Spikes Noted: No (wbc 8.5) Respiratory Status: Nasal cannula (3 liters) History of Recent Intubation: No Behavior/Cognition:  Alert;Cooperative;Pleasant mood;Confused;Distractible;Requires cueing [facility]?) Oral Cavity Assessment: Within Functional Limits Oral Care Completed by SLP: Recent completion by staff Oral Cavity - Dentition: Adequate natural dentition Vision: Functional for self-feeding (shaky UEs) Self-Feeding Abilities: Able to feed self;Needs assist;Needs set up (shaky UEs; Cognitive status) Patient Positioning: Upright in bed Baseline Vocal Quality: Normal Volitional Cough: Strong Volitional Swallow: Able to elicit    Oral/Motor/Sensory Function Overall Oral Motor/Sensory Function: Within functional limits   Ice Chips Ice chips: Not tested   Thin Liquid Thin Liquid: Within functional limits Presentation: Cup;Self Fed;Straw (assisted; 3 trials via cup, several sips via straw w/ NSG) Other Comments: pt swallowed pills w/ water sipping via straw; a delayed cough noted post final sip/swallow of pills and belching - unsure if the belching elicited the cough.     Nectar Thick Nectar Thick Liquid: Not tested   Honey Thick Honey Thick Liquid: Not tested   Puree Puree: Within functional limits Presentation: Self Fed (assisted)   Solid   GO   Solid: Within functional limits (assisted) Presentation: Self Fed Other Comments: breakfast meals         Orinda Kenner, MS, CCC-SLP Loreli Debruler 07/21/2016,11:42 AM

## 2016-07-21 NOTE — Care Management Important Message (Signed)
Important Message  Patient Details  Name: Theresa Vasquez MRN: [id number] Date of Birth: Aug 21, 1930   Medicare Important Message Given:  Yes    Shelbie Ammons, RN 07/21/2016, 8:22 AM

## 2016-07-22 ENCOUNTER — Inpatient Hospital Stay: Payer: Medicare Other

## 2016-07-22 LAB — GLUCOSE, CAPILLARY
GLUCOSE-CAPILLARY: 232 mg/dL — AB (ref 65–99)
GLUCOSE-CAPILLARY: 253 mg/dL — AB (ref 65–99)
GLUCOSE-CAPILLARY: 218 mg/dL — AB (ref 65–99)
GLUCOSE-CAPILLARY: 322 mg/dL — AB (ref 65–99)

## 2016-07-22 LAB — BASIC METABOLIC PANEL
ANION GAP: 9 (ref 5–15)
BUN: 32 mg/dL — ABNORMAL HIGH (ref 6–20)
CALCIUM: 8.3 mg/dL — AB (ref 8.9–10.3)
CO2: 33 mmol/L — ABNORMAL HIGH (ref 22–32)
Chloride: 98 mmol/L — ABNORMAL LOW (ref 101–111)
Creatinine, Ser: 0.8 mg/dL (ref 0.44–1.00)
GLUCOSE: 261 mg/dL — AB (ref 65–99)
POTASSIUM: 3.6 mmol/L (ref 3.5–5.1)
Sodium: 140 mmol/L (ref 135–145)

## 2016-07-22 LAB — PROCALCITONIN: Procalcitonin: <0.1 ng/mL

## 2016-07-22 MED ORDER — INSULIN GLARGINE 100 UNIT/ML ~~LOC~~ SOLN
10.0000 [IU] | Freq: Every day | SUBCUTANEOUS | Status: DC
Start: 2016-07-22 — End: 2016-07-24
  Administered 2016-07-22: 10 [IU] via SUBCUTANEOUS
  Filled 2016-07-22 (×2): qty 0.1

## 2016-07-22 MED ORDER — AMOXICILLIN-POT CLAVULANATE 875-125 MG PO TABS
1.0000 | ORAL_TABLET | Freq: Two times a day (BID) | ORAL | Status: DC
  Administered 2016-07-22 – 2016-07-23 (×2): 1 via ORAL
  Filled 2016-07-22 (×2): qty 1

## 2016-07-22 MED ORDER — ALPRAZOLAM 0.25 MG PO TABS
0.2500 mg | ORAL_TABLET | Freq: Three times a day (TID) | ORAL | Status: DC | PRN
  Administered 2016-07-22: 16:00:00 0.25 mg via ORAL
  Filled 2016-07-22 (×2): qty 1

## 2016-07-22 MED ORDER — FUROSEMIDE 40 MG PO TABS
40.0000 mg | ORAL_TABLET | Freq: Two times a day (BID) | ORAL | Status: DC
  Administered 2016-07-22 – 2016-07-23 (×4): 40 mg via ORAL
  Filled 2016-07-22 (×4): qty 1

## 2016-07-22 NOTE — Progress Notes (Signed)
Inpatient Diabetes Program Recommendations  AACE/ADA: New Consensus Statement on Inpatient Glycemic Control (2015)  Target Ranges:  Prepandial:   less than 140 mg/dL      [facility] postprandial:   less than 180 mg/dL (1-2 hours)      Critically ill patients:  140 - 180 mg/dL   Results for Theresa Vasquez, Theresa Vasquez (MRN [id number]) as of 07/22/2016 08:20  Ref. Range 07/21/2016 07:48 07/21/2016 12:21 07/21/2016 16:54 07/21/2016 21:21  Glucose-Capillary Latest Ref Range: 65 - 99 mg/dL 190 (H) 300 (H) 297 (H) 256 (H)   Results for Theresa Vasquez, Theresa Vasquez (MRN [id number]) as of 07/22/2016 08:20  Ref. Range 07/22/2016 08:05  Glucose-Capillary Latest Ref Range: 65 - 99 mg/dL 232 (H)    Admit with: SOB  History: DM, COPD, CHF, Dementia  Home DM Meds: Glumetza 500 mg daily  Current Insulin Orders: Novolog Sensitive Correction Scale/ SSI (0-9 units) TID AC + HS      Novolog 4 units TID with meals       MD- Note patient still receiving Solumedrol 60 mg BID.  Glucose levels elevated since initiation of IV steroids.  Note Novolog 4 units TID with meals started last PM.  Fasting glucose levels still elevated.   MD- Please consider starting low dose basal insulin: Lantus 9 units daily (0.1 units/kg dosing based on weight of 88kg)      --Will follow patient during hospitalization--  Wyn Quaker RN, MSN, CDE Diabetes Coordinator Inpatient Glycemic Control Team Team Pager: [phone] (8a-5p)

## 2016-07-22 NOTE — Progress Notes (Signed)
[place] [facility] [place] NAME: Theresa Vasquez    MR#:  [id number]  DATE OF BIRTH:  [birth year]  SUBJECTIVE:  CHIEF COMPLAINT:   Chief Complaint  Patient presents with  . Shortness of Breath    Pt. here via EMS from [facility] for respirtory distress.   - confused and not oriented - Feels better. Slightly dyspneic on exertion. Remains on 2.5 L oxygen  REVIEW OF SYSTEMS:  Review of Systems  Unable to perform ROS: Dementia    DRUG ALLERGIES:  No Known Allergies  VITALS:  Blood pressure (!) 137/52, pulse 75, temperature 97.9 F (36.6 C), temperature source Oral, resp. rate 20, height 5\' 2"  (1.575 m), weight 88.5 kg (195 lb), SpO2 90 %.  PHYSICAL EXAMINATION:  Physical Exam  GENERAL:  83 y.o.-year-old patient lying in the bed with no acute distress.  EYES: Pupils equal, round, reactive to light and accommodation. No scleral icterus. Extraocular muscles intact.  HEENT: Head atraumatic, normocephalic. Oropharynx and nasopharynx clear.  NECK:  Supple, no jugular venous distention. No thyroid enlargement, no tenderness.  LUNGS: Scattered wheezing and rhonchi at the bases posteriorly. Otherwise no crackles or crepitation. No use of accessory muscles of respiration.  CARDIOVASCULAR: S1, S2 normal. No rubs, or gallops. 3/6 systolic murmur is present ABDOMEN: Soft, nontender, nondistended. Bowel sounds present. No organomegaly or mass.  EXTREMITIES: No pedal edema, cyanosis, or clubbing. Left foot in a boot. NEUROLOGIC: Following commands. No significant new motor or sensory deficits noted. PSYCHIATRIC: The patient is alert and oriented to self.  SKIN: No obvious rash, lesion, or ulcer.    LABORATORY PANEL:   CBC  Recent Labs Lab 07/20/16 0409  WBC 8.5  HGB 8.3*  HCT 24.7*  PLT 394   ------------------------------------------------------------------------------------------------------------------  Chemistries   Recent  Labs Lab 07/20/16 0409  NA 140  K 3.7  CL 100*  CO2 32  GLUCOSE 154*  BUN 15  CREATININE 0.70  CALCIUM 8.0*   ------------------------------------------------------------------------------------------------------------------  Cardiac Enzymes  Recent Labs Lab 07/19/16 0645  TROPONINI <0.03   ------------------------------------------------------------------------------------------------------------------  RADIOLOGY:  US Venous Img Lower Unilateral Left  Result Date: 07/21/2016 CLINICAL DATA:  Left lower extremity edema for 1 month. EXAM: Left LOWER EXTREMITY VENOUS DOPPLER ULTRASOUND TECHNIQUE: Gray-scale sonography with graded compression, as well as color Doppler and duplex ultrasound were performed to evaluate the lower extremity deep venous systems from the level of the common femoral vein and including the common femoral, femoral, profunda femoral, popliteal and calf veins including the posterior tibial, peroneal and gastrocnemius veins when visible. The superficial great saphenous vein was also interrogated. Spectral Doppler was utilized to evaluate flow at rest and with distal augmentation maneuvers in the common femoral, femoral and popliteal veins. COMPARISON:  None. FINDINGS: Contralateral Common Femoral Vein: Respiratory phasicity is normal and symmetric with the symptomatic side. No evidence of thrombus. Normal compressibility. Common Femoral Vein: No evidence of thrombus. Normal compressibility, respiratory phasicity and response to augmentation. Saphenofemoral Junction: No evidence of thrombus. Normal compressibility and flow on color Doppler imaging. Profunda Femoral Vein: No evidence of thrombus. Normal compressibility and flow on color Doppler imaging. Femoral Vein: No evidence of thrombus. Normal compressibility, respiratory phasicity and response to augmentation. Popliteal Vein: No evidence of thrombus. Normal compressibility, respiratory phasicity and response to  augmentation. Calf Veins: No evidence of thrombus. Normal compressibility and flow on color Doppler imaging. Superficial Great Saphenous Vein: No evidence of thrombus. Normal compressibility and flow on color Doppler  imaging. Venous Reflux:  None. Other Findings:  None. IMPRESSION: No evidence of deep venous thrombosis seen in left lower extremity. Electronically Signed   By: Marijo Conception, M.D.   On: 07/21/2016 10:45   Dg Foot 2 Views Left  Result Date: 07/21/2016 CLINICAL DATA:  Dorsal left foot pain. EXAM: LEFT FOOT - 2 VIEW COMPARISON:  None. FINDINGS: There is diffuse osteopenia with diffuse narrowing of the IP joints and of the MTP joints throughout the foot. There is bunion formation on the head of the first metatarsal. There is also diffuse narrowing of the joints in the midfoot with prominent dorsal osteophytes at the talonavicular joint with severe arthritis at that joint. No fracture or bone destruction. Slight arthritis of the ankle joint. IMPRESSION: Severe arthritis of the talonavicular joint. Diffuse arthritis throughout the remainder of the foot as described. Osteopenia. Electronically Signed   By: Lorriane Shire M.D.   On: 07/21/2016 10:27    EKG:   Orders placed or performed during the hospital encounter of 07/19/16  . EKG 12-Lead  . EKG 12-Lead    ASSESSMENT AND PLAN:   83 year old female with past medical history of hypertension, hyperlipidemia, diabetes, peripheral vascular disease, history of CHF, COPD, dementia, hypothyroidism who presents to the hospital due to shortness of breath and hypoxia.  1. Acute on chronic respiratory failure with hypoxia-secondary to acute on chronic diastolic CHF and COPD exacerbation. -Required BiPAP in the emergency room, currently on 2.5 L oxygen. - on IV lasix, change to PO bid today. Check BMP for potassium - ECHO from last year with LVH, EF 60% and grade 2 diastolic dysfunction - also on metoprolol and lisinopril  2. COPD  exacerbation- on IV steroids with improvement- taper in AM Continue inhalers and nebs  3. History of atrial fibrillation-currently rate controlled. Continue amiodarone, continue metoprolol. Not on anticoagulation  4. Essential hypertension-continue Norvasc, metoprolol, lisinopril.  5. Hypothyroidism-continue Synthroid.  6. Diabetes type 2 without complication-hold metformin. Place on sliding scale insulin for now.  7. Diabetic neuropathy-continue gabapentin.  8. Dementia-continue Aricept, Depakote and Seroquel.  Physical Therapy consult pending Recent discharge from [facility] after her left hip surgery    All the records are reviewed and case discussed with Care Management/Social Workerr. Management plans discussed with the patient, family and they are in agreement.  CODE STATUS: Full code  TOTAL TIME TAKING CARE OF THIS PATIENT: 37 minutes.   POSSIBLE D/C tomorrow, DEPENDING ON CLINICAL CONDITION.   Gladstone Lighter M.D on 07/22/2016 at 7:57 AM  Between 7am to 6pm - Pager - [phone]  After 6pm go to [email or web address] - Proofreader  [facility]  Office  [phone]  CC: Primary care physician; Park Liter, DO

## 2016-07-22 NOTE — Progress Notes (Signed)
Physical Therapy Treatment Patient Details Name: Theresa Vasquez MRN: [id number] DOB: Apr 08, 1931 Today's Date: 07/22/2016    History of Present Illness Pt is an 83 y.o. female presenting from [facility] (was at [facility] for hip fx s/p L IMN 06/18/16; WBAT L LE) and on day pt was supposed to go home pt with SOB/respiratory distress.  Pt admitted to hospital with acute on chronic respiratory failure with hypoxia secondary to CHF.  PMH includes s/p L IMN 06/18/16, dementia, a-fib with RVR, CHF, COPD, DM, and htn.    PT Comments    Pt eager and requesting to get OOB this morning and reporting needing to also toilet upon PT entering room.  Pt's O2 on 3 L O2 via nasal cannula 90% at rest in bed.  Upon sitting on edge of bed pt's O2 noted to be 84% and nursing cleared PT to increase O2 to 4 L and O2 increased to 92%.  Pt assisted to bedside commode and pt's O2 decreased to 81-83% and within a few minutes sitting rest break and vc's for pursed lip breathing O2 increased back up to 92% on 4L.  Pt requiring clean-up in standing d/t bowel incontinence (NT came to assist) and pt was teary/upset d/t this incontinence (improved mood noted by end of session and caregiver present for pt support).  In standing, bedside commode removed behind pt and recliner chair brought behind pt so pt able to sit down in chair.  End of session pt's O2 decreased back to 3 L and pt's O2 sats 93%.  Nursing notified regarding above oxygen saturations.  Will continue to progress pt with functional mobility per pt tolerance with monitoring of pt's oxygen.   Follow Up Recommendations  SNF     Equipment Recommendations  Rolling walker with 5" wheels    Recommendations for Other Services       Precautions / Restrictions Precautions Precautions: Fall Restrictions Weight Bearing Restrictions: Yes LLE Weight Bearing: Weight bearing as tolerated (Per discussion with wound nurse 07/22/16, protective wound dressing must be in place for  OOB WB'ing on L LE but no special shoes were required; only use Prevalon boot when in bed)   Mobility  Bed Mobility Overal bed mobility: Needs Assistance Bed Mobility: Supine to Sit     Supine to sit: Mod assist     General bed mobility comments: assist for trunk and B LE's; vc's for use of siderail; increased effort and time to perform  Transfers Overall transfer level: Needs assistance Equipment used: Rolling walker (2 wheeled) Transfers: Sit to/from Stand Sit to Stand: Mod assist;Max assist         General transfer comment: x1 stand from bed; x1 stand from bedside commode  Ambulation/Gait Ambulation/Gait assistance: Min assist;Mod assist Ambulation Distance (Feet): 3 Feet (bed to bedside commode) Assistive device: Rolling walker (2 wheeled)   Gait velocity: decreased   General Gait Details: mildly antalgic L LE; vc's for walker use and gait technique   Stairs            Wheelchair Mobility    Modified Rankin (Stroke Patients Only)       Balance Overall balance assessment: Needs assistance;History of Falls Sitting-balance support: Bilateral upper extremity supported;Feet supported Sitting balance-Leahy Scale: Fair     Standing balance support: Bilateral upper extremity supported (on RW) Standing balance-Leahy Scale: Poor Standing balance comment: Posterior lean initially with standing requiring vc's and assist to shift weight forward  Cognition Arousal/Alertness: Awake/alert Behavior During Therapy: WFL for tasks assessed/performed Overall Cognitive Status: History of cognitive impairments - at baseline                 General Comments: Pt's caregiver present during session.    Exercises      General Comments General comments (skin integrity, edema, etc.): Pt's caregiver present during session.  Nursing cleared pt for participation in physical therapy.  Pt agreeable to PT session.      Pertinent Vitals/Pain  Pain Assessment: 0-10 Pain Score: 7  Pain Location: L foot Pain Descriptors / Indicators: Tender;Sore Pain Intervention(s): Limited activity within patient's tolerance;Monitored during session;Repositioned;Patient requesting pain meds-RN notified    Home Living                      Prior Function            PT Goals (current goals can now be found in the care plan section) Acute Rehab PT Goals Patient Stated Goal: to be able to walk again PT Goal Formulation: With patient/family Time For Goal Achievement: 08/03/16 Potential to Achieve Goals: Fair Progress towards PT goals: Progressing toward goals    Frequency    Min 2X/week      PT Plan Current plan remains appropriate    Co-evaluation             End of Session Equipment Utilized During Treatment: Gait belt (O2 via nasal cannula) Activity Tolerance: Patient limited by fatigue;Other (comment) (Limited d/t O2 desaturation) Patient left: in chair;with call bell/phone within reach;with chair alarm set;with family/visitor present     Time: [id number] PT Time Calculation (min) (ACUTE ONLY): 38 min  Charges:  $Therapeutic Exercise: 8-22 mins $Therapeutic Activity: 23-37 mins                    G CodesLeitha Bleak 08/07/16, 12:22 PM [name], [place]

## 2016-07-22 NOTE — NC FL2 (Signed)
[place] LEVEL OF CARE SCREENING TOOL     IDENTIFICATION  Patient Name: Theresa Vasquez Birthdate: 1931/01/05 Sex: female Admission Date (Current Location): 07/19/2016  [place] and [place] Number:  Engineering geologist and Address:  [facility], [address] [place], [place] [address]      Provider Number: [id number]  Attending Physician Name and Address:  Gladstone Lighter, MD  Relative Name and Phone Number:  Shavannah, Steuer   [phone]     Current Level of Care: Hospital Recommended Level of Care: [place] Prior Approval Number:    Date Approved/Denied:   PASRR Number: [id number]  Discharge Plan: SNF    Current Diagnoses: Patient Active Problem List   Diagnosis Date Noted  . Pressure injury of skin 07/21/2016  . Closed left hip fracture ([place]) 06/17/2016  . Acute respiratory failure with hypoxia ([place]) 05/18/2016  . Benign neoplasm 12/27/2015  . PNA (pneumonia) 07/18/2015  . Acute diastolic CHF (congestive heart failure) ([place])   . Paroxysmal atrial fibrillation ([place]) 05/28/2015  . Acute GI bleeding   . 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 COPD, severe ([place]) 05/14/2015  . History of pneumonia 05/14/2015  . Benign hypertensive renal disease 05/14/2015  . Anxiety disorder 05/14/2015  . Diabetic polyneuropathy ([place]) 05/14/2015  . Diverticulosis 05/14/2015  . Abnormality of gait 05/14/2015  . Barrett's esophagus 05/14/2015  . Drug addiction in remission ([place]) 05/14/2015  . Mitral regurgitation 05/14/2015  . LBBB (left bundle branch block) 05/14/2015  . Carotid bruit 05/14/2015  . Ischemic colitis ([place]) 05/14/2015  . History of peptic ulcer disease 05/14/2015  . Chronic diastolic CHF (congestive heart failure) ([place]) 05/14/2015  . Atherosclerosis of aorta ([place]) 05/14/2015  . Involutional osteoporosis 05/14/2015  . Diverticulitis 05/14/2015  . Essential  thrombocythemia ([place]) 05/14/2015  . H/O recurrent pneumonia 05/14/2015  . Heart & renal disease, hypertensive, with heart failure ([place]) 05/14/2015  . Adult hypothyroidism 05/14/2015  . Hypoxemia 05/14/2015  . Non-rheumatic mitral regurgitation 05/14/2015  . Other specified symptoms and signs involving the circulatory and respiratory systems 05/14/2015  . Psychoactive substance dependence ([place]) 05/14/2015  . Peripheral vascular disease ([place]) 05/14/2015  . Vascular dementia with behavioral disturbance 05/14/2015  . Vascular disorder of intestine ([place]) 05/14/2015  . Iron deficiency anemia 11/24/2014  . Chronic systolic heart failure ([place]) 06/05/2014  . Lung edema 06/05/2014  . Alveolar hypoventilation 06/05/2014  . Arthritis, degenerative 02/20/2014    Orientation RESPIRATION BLADDER Height & Weight     Self, Place  O2 (3.5 L) Continent Weight: 195 lb (88.5 kg) Height:  5\' 2"  (157.5 cm)  BEHAVIORAL SYMPTOMS/MOOD NEUROLOGICAL BOWEL NUTRITION STATUS      Continent Diet (Heart Healthy)  AMBULATORY STATUS COMMUNICATION OF NEEDS Skin   Limited Assist Verbally PU Stage and Appropriate Care                       Personal Care Assistance Level of Assistance  Bathing, Feeding, Dressing Bathing Assistance: Limited assistance Feeding assistance: Independent Dressing Assistance: Limited assistance     Functional Limitations Info  Sight, Hearing, Speech Sight Info: Adequate Hearing Info: Adequate Speech Info: Adequate    SPECIAL CARE FACTORS FREQUENCY  PT (By licensed PT)     PT Frequency: 5x a week              Contractures Contractures Info: Not present    Additional Factors Info  Insulin Sliding  Scale, Psychotropic     Psychotropic Info: venlafaxine XR (EFFEXOR-XR) 24 hr capsule 37.5 mg or QUEtiapine (SEROQUEL) tablet 25 mg Insulin Sliding Scale Info: insulin aspart (novoLOG) injection 0-9 Units 3x a day       Current Medications (07/22/2016):  This is the  current hospital active medication list Current Facility-Administered Medications  Medication Dose Route Frequency Provider Last Rate Last Dose  . acetaminophen (TYLENOL) tablet 650 mg  650 mg Oral Q6H PRN Henreitta Leber, MD   650 mg at 07/22/16 0950   Or  . acetaminophen (TYLENOL) suppository 650 mg  650 mg Rectal Q6H PRN Henreitta Leber, MD      . albuterol (PROVENTIL) (2.5 MG/3ML) 0.083% nebulizer solution 2.5 mg  2.5 mg Nebulization Q6H Epifanio Lesches, MD   2.5 mg at 07/22/16 1429  . ALPRAZolam Duanne Moron) tablet 0.25 mg  0.25 mg Oral TID PRN Gladstone Lighter, MD   0.25 mg at 07/22/16 1552  . amLODipine (NORVASC) tablet 5 mg  5 mg Oral BID Henreitta Leber, MD   5 mg at 07/22/16 0950  . amoxicillin-clavulanate (AUGMENTIN) 875-125 MG per tablet 1 tablet  1 tablet Oral Q12H Gladstone Lighter, MD      . aspirin EC tablet 81 mg  81 mg Oral Daily Henreitta Leber, MD   81 mg at 07/22/16 0950  . cholecalciferol (VITAMIN D) tablet 1,000 Units  1,000 Units Oral Daily Henreitta Leber, MD   1,000 Units at 07/22/16 0950  . divalproex (DEPAKOTE) DR tablet 250 mg  250 mg Oral TID Henreitta Leber, MD   250 mg at 07/22/16 1513  . donepezil (ARICEPT) tablet 10 mg  10 mg Oral QHS Henreitta Leber, MD   10 mg at 07/21/16 2024  . enoxaparin (LOVENOX) injection 40 mg  40 mg Subcutaneous Q24H Henreitta Leber, MD   40 mg at 07/21/16 2028  . feeding supplement (PRO-STAT SUGAR FREE 64) liquid 30 mL  30 mL Oral BID Epifanio Lesches, MD   30 mL at 07/22/16 0949  . ferrous sulfate tablet 325 mg  325 mg Oral BID WC Henreitta Leber, MD   325 mg at 07/22/16 0950  . furosemide (LASIX) tablet 40 mg  40 mg Oral BID Gladstone Lighter, MD   40 mg at 07/22/16 0950  . gabapentin (NEURONTIN) capsule 400 mg  400 mg Oral BID Henreitta Leber, MD   400 mg at 07/22/16 0950  . guaiFENesin (MUCINEX) 12 hr tablet 600 mg  600 mg Oral BID Henreitta Leber, MD   600 mg at 07/22/16 0950  . guaiFENesin-dextromethorphan (ROBITUSSIN DM)  100-10 MG/5ML syrup 5 mL  5 mL Oral Q4H PRN Henreitta Leber, MD      . insulin aspart (novoLOG) injection 0-5 Units  0-5 Units Subcutaneous QHS Henreitta Leber, MD   3 Units at 07/21/16 2144  . insulin aspart (novoLOG) injection 0-9 Units  0-9 Units Subcutaneous TID WC Henreitta Leber, MD   5 Units at 07/22/16 1230  . insulin aspart (novoLOG) injection 4 Units  4 Units Subcutaneous TID WC Epifanio Lesches, MD   4 Units at 07/22/16 1230  . insulin glargine (LANTUS) injection 10 Units  10 Units Subcutaneous QHS Gladstone Lighter, MD      . levothyroxine (SYNTHROID, LEVOTHROID) tablet 175 mcg  175 mcg Oral QAC breakfast Henreitta Leber, MD   175 mcg at 07/22/16 0950  . lisinopril (PRINIVIL,ZESTRIL) tablet 5 mg  5 mg Oral  BID Epifanio Lesches, MD   5 mg at 07/22/16 0950  . Melatonin TABS 5 mg  5 mg Oral QHS Henreitta Leber, MD   5 mg at 07/21/16 2005  . methylPREDNISolone sodium succinate (SOLU-MEDROL) 125 mg/2 mL injection 60 mg  60 mg Intravenous Q12H Epifanio Lesches, MD   60 mg at 07/22/16 0949  . mometasone-formoterol (DULERA) 200-5 MCG/ACT inhaler 2 puff  2 puff Inhalation BID Henreitta Leber, MD   2 puff at 07/22/16 0951  . morphine CONCENTRATE 10 MG/0.5ML oral solution 10 mg  10 mg Oral Q4H PRN Henreitta Leber, MD   10 mg at 07/19/16 1702  . ondansetron (ZOFRAN) tablet 4 mg  4 mg Oral Q6H PRN Henreitta Leber, MD       Or  . ondansetron (ZOFRAN) injection 4 mg  4 mg Intravenous Q6H PRN Henreitta Leber, MD      . QUEtiapine (SEROQUEL) tablet 25 mg  25 mg Oral QHS Henreitta Leber, MD   25 mg at 07/21/16 2027  . senna-docusate (Senokot-S) tablet 1 tablet  1 tablet Oral QHS PRN Henreitta Leber, MD      . tiotropium [facility]) inhalation capsule 18 mcg  18 mcg Inhalation Daily Henreitta Leber, MD   18 mcg at 07/22/16 0951  . venlafaxine XR (EFFEXOR-XR) 24 hr capsule 37.5 mg  37.5 mg Oral Q breakfast Henreitta Leber, MD   37.5 mg at 07/22/16 [id number]     Discharge Medications: Please see  discharge summary for a list of discharge medications.  Relevant Imaging Results:  Relevant Lab Results:   Additional Information [id number]  Anell Barr

## 2016-07-23 ENCOUNTER — Inpatient Hospital Stay: Payer: Medicare Other

## 2016-07-23 LAB — GLUCOSE, CAPILLARY
GLUCOSE-CAPILLARY: 223 mg/dL — AB (ref 65–99)
Glucose-Capillary: 224 mg/dL — ABNORMAL HIGH (ref 65–99)
Glucose-Capillary: 319 mg/dL — ABNORMAL HIGH (ref 65–99)

## 2016-07-23 MED ORDER — INSULIN GLARGINE 100 UNIT/ML ~~LOC~~ SOLN: 10.0000 [IU] | Freq: Every day | SUBCUTANEOUS | 11 refills | Status: DC

## 2016-07-23 MED ORDER — AMIODARONE HCL 200 MG PO TABS
100.0000 mg | ORAL_TABLET | Freq: Every day | ORAL | Status: DC
  Administered 2016-07-23: 17:00:00 100 mg via ORAL
  Filled 2016-07-23: qty 1

## 2016-07-23 MED ORDER — ACETAMINOPHEN 500 MG PO TABS: 500.0000 mg | ORAL_TABLET | ORAL | 0 refills | Status: AC | PRN

## 2016-07-23 MED ORDER — ALBUTEROL SULFATE (2.5 MG/3ML) 0.083% IN NEBU
2.5000 mg | INHALATION_SOLUTION | Freq: Three times a day (TID) | RESPIRATORY_TRACT | Status: DC
  Administered 2016-07-23 (×3): 2.5 mg via RESPIRATORY_TRACT
  Filled 2016-07-23 (×3): qty 3

## 2016-07-23 MED ORDER — FUROSEMIDE 10 MG/ML IJ SOLN
40.0000 mg | Freq: Once | INTRAMUSCULAR | Status: AC
  Administered 2016-07-23: 17:00:00 40 mg via INTRAVENOUS
  Filled 2016-07-23: qty 4

## 2016-07-23 MED ORDER — PREDNISONE 10 MG (21) PO TBPK: 10.0000 mg | ORAL_TABLET | Freq: Every day | ORAL | 0 refills | Status: DC

## 2016-07-23 MED ORDER — LISINOPRIL 20 MG PO TABS: 20.0000 mg | ORAL_TABLET | Freq: Every day | ORAL | 1 refills | Status: DC

## 2016-07-23 MED ORDER — AMOXICILLIN-POT CLAVULANATE 875-125 MG PO TABS: 1.0000 | ORAL_TABLET | Freq: Two times a day (BID) | ORAL | 0 refills | Status: DC

## 2016-07-23 MED ORDER — ALPRAZOLAM 0.25 MG PO TABS: 0.2500 mg | ORAL_TABLET | Freq: Three times a day (TID) | ORAL | 0 refills | Status: DC | PRN

## 2016-07-23 NOTE — Progress Notes (Signed)
Pt discharged to [facility]. VSS. Son at bedside prior to discharge. All personal belongings returned. Pt left via EMS non-emergent transport.

## 2016-07-23 NOTE — Clinical Social Work Note (Addendum)
Patient to be d/c'ed today to [facility] [place].  Patient and family agreeable to plans will transport via ems RN to call report [phone] room [place].  Evette Cristal, MSW, LCSWA Mon-Fri 8a-4:30p [phone]

## 2016-07-23 NOTE — Clinical Social Work Note (Addendum)
CSW was informed that patient would like to return to [facility] [place].  Patient has [employer], CSW began Ship broker for patient.  CSW to continue to follow patient's progress trhoughout discharge planning.  1:30pm  CSW received phone call from [facility] who are requesting physician's pager number to do a peer to peer regarding patient returning back to SNF.  CSW paged physician to inform her that insurance would like a peer to peer.  Jones Broom. [place], MSW, [place]  Mon-Fri 8a-4:30p 07/23/2016 11:25 AM

## 2016-07-23 NOTE — Progress Notes (Signed)
Inpatient Diabetes Program Recommendations  AACE/ADA: New Consensus Statement on Inpatient Glycemic Control (2015)  Target Ranges:  Prepandial:   less than 140 mg/dL      Peak postprandial:   less than 180 mg/dL (1-2 hours)      Critically ill patients:  140 - 180 mg/dL   Results for Theresa Vasquez, Theresa Vasquez (MRN [id number]) as of 07/23/2016 14:01  Ref. Range 07/22/2016 08:05 07/22/2016 11:41 07/22/2016 16:41 07/22/2016 21:54  Glucose-Capillary Latest Ref Range: 65 - 99 mg/dL 232 (H) 253 (H) 218 (H) 322 (H)   Results for [name], [name] (MRN [id number]) as of 07/23/2016 14:01  Ref. Range 07/23/2016 07:57 07/23/2016 11:30  Glucose-Capillary Latest Ref Range: 65 - 99 mg/dL 223 (H) 224 (H)    Admit with: SOB  History: DM, COPD, CHF, Dementia  Home DM Meds: Glumetza 500 mg daily  Current Insulin Orders: Novolog Sensitive Correction Scale/ SSI (0-9 units) TID AC + HS                                       Novolog 4 units TID with meals      Lantus 10 units QHS       MD- Note patient still receiving Solumedrol 60 mg BID. Glucose levels elevated since initiation of IV steroids.  Note Novolog 4 units TID with meals started on the afternoon of 01/02.  Also note Lantus 10 units QHS started last PM.  Please consider the following while pt still getting steroids:  1. Increase Lantus to 15 units QHS  2. Increase Novolog Meal Coverage to: Novolog 6 units TID with meals (hold if pt eats <50% of meal)     --Will follow patient during hospitalization--  Wyn Quaker RN, MSN, CDE Diabetes Coordinator Inpatient Glycemic Control Team Team Pager: [phone] (8a-5p)

## 2016-07-23 NOTE — Progress Notes (Signed)
[place] [facility] [place] NAME: Theresa Vasquez    MR#:  [id number]  DATE OF BIRTH:  Sep 01, 1930  SUBJECTIVE:  CHIEF COMPLAINT:   Chief Complaint  Patient presents with  . Shortness of Breath    Pt. here via EMS from [facility] for respirtory distress.   - calm and comfortable,, son at bedside - awaiting placement  REVIEW OF SYSTEMS:  Review of Systems  Unable to perform ROS: Dementia    DRUG ALLERGIES:  No Known Allergies  VITALS:  Blood pressure (!) 145/60, pulse 65, temperature 98.4 F (36.9 C), temperature source Oral, resp. rate 20, height 5\' 2"  (1.575 m), weight 88.5 kg (195 lb), SpO2 91 %.  PHYSICAL EXAMINATION:  Physical Exam  GENERAL:  83 y.o.-year-old patient lying in the bed with no acute distress.  EYES: Pupils equal, round, reactive to light and accommodation. No scleral icterus. Extraocular muscles intact.  HEENT: Head atraumatic, normocephalic. Oropharynx and nasopharynx clear.  NECK:  Supple, no jugular venous distention. No thyroid enlargement, no tenderness.  LUNGS: improved wheezing and rhonchi at the bases posteriorly. Otherwise no crackles or crepitation. No use of accessory muscles of respiration.  CARDIOVASCULAR: S1, S2 normal. No rubs, or gallops. 3/6 systolic murmur is present ABDOMEN: Soft, nontender, nondistended. Bowel sounds present. No organomegaly or mass.  EXTREMITIES: No pedal edema, cyanosis, or clubbing. Left foot in a boot. NEUROLOGIC: Following commands. No significant new motor or sensory deficits noted. PSYCHIATRIC: The patient is alert and oriented to self.  SKIN: No obvious rash, lesion, or ulcer.    LABORATORY PANEL:   CBC  Recent Labs Lab 07/20/16 0409  WBC 8.5  HGB 8.3*  HCT 24.7*  PLT 394   ------------------------------------------------------------------------------------------------------------------  Chemistries   Recent Labs Lab 07/22/16 0534  NA 140  K 3.6    CL 98*  CO2 33*  GLUCOSE 261*  BUN 32*  CREATININE 0.80  CALCIUM 8.3*   ------------------------------------------------------------------------------------------------------------------  Cardiac Enzymes  Recent Labs Lab 07/19/16 0645  TROPONINI <0.03   ------------------------------------------------------------------------------------------------------------------  RADIOLOGY:  Dg Foot 2 Views Left  Result Date: 07/23/2016 CLINICAL DATA:  Plantar ulcer, hindfoot region EXAM: LEFT FOOT - 2 VIEW COMPARISON:  July 21, 2016 FINDINGS: Frontal and lateral views were obtained. Bones are diffusely osteoporotic. There is no acute fracture or dislocation. No erosive change or bony destruction. There is narrowing of all MTP, PIP, and DIP joints. There is marked narrowing and bony overgrowth at the talonavicular joint, stable. There is no soft tissue abscess or calcification. IMPRESSION: Multifocal arthropathy, most marked in the talonavicular joint. Bones osteoporotic. No erosive change or bony destruction. No acute fracture or dislocation. No soft tissue ulceration or mass is evident by radiography. Appearance is essentially stable compared to recent prior study. Electronically Signed   By: Lowella Grip III M.D.   On: 07/23/2016 14:52   Dg Chest 2 View  Result Date: 07/22/2016 CLINICAL DATA:  Respiratory distress. EXAM: CHEST  2 VIEW COMPARISON:  07/19/2016.  06/20/2016. FINDINGS: Cardiomegaly with bilateral pulmonary interstitial prominence and noted consistent with congestive heart failure with pulmonary interstitial edema. Interstitial pneumonitis cannot be excluded. Small right pleural effusion. IMPRESSION: Congestive heart failure with bilateral from interstitial edema small right pleural effusion. Pneumonitis cannot be excluded . Electronically Signed   By: Marcello Moores  Register   On: 07/22/2016 14:24    EKG:   Orders placed or performed during the hospital encounter of 07/19/16  . EKG  12-Lead  .  EKG 12-Lead    ASSESSMENT AND PLAN:   83 year old female with past medical history of hypertension, hyperlipidemia, diabetes, peripheral vascular disease, history of CHF, COPD, dementia, hypothyroidism who presents to the hospital due to shortness of breath and hypoxia.  1. Acute on chronic respiratory failure with hypoxia-secondary to acute on chronic diastolic CHF and COPD exacerbation. -Required BiPAP in the emergency room, currently on 2.5 L oxygen. Uses nocturnal o2 only at the house. - changed lasix to PO, CXR with CHF findings.  - ECHO from last year with LVH, EF 60% and grade 2 diastolic dysfunction - also on metoprolol and lisinopril  2. COPD exacerbation- improving, change to oral steroids at discharge On augmentin for 3 more days for bronchitis Continue inhalers and nebs  3. History of atrial fibrillation-currently rate controlled. Continue amiodarone, continue metoprolol. Not on anticoagulation  4. Essential hypertension-continue Norvasc, metoprolol, lisinopril.  5. Hypothyroidism-continue Synthroid.  6. Diabetes type 2 without complication- on metformin, lantus and ssi  7. Diabetic neuropathy-continue gabapentin.  8. Dementia-continue Aricept, Depakote and Seroquel. Has anxiety episodes - prn xanax  Physical Therapy consult recommended SNF again which is appropriate.    All the records are reviewed and case discussed with Care Management/Social Workerr. Management plans discussed with the patient, family and they are in agreement.  CODE STATUS: Full code  TOTAL TIME TAKING CARE OF THIS PATIENT: 37 minutes.   POSSIBLE D/C today, DEPENDING ON CLINICAL CONDITION.   Gladstone Lighter M.D on 07/23/2016 at 3:17 PM  Between 7am to 6pm - Pager - [phone]  After 6pm go to [email or web address] - Proofreader  [facility]  Office  [phone]  CC: Primary care physician; Park Liter, DO

## 2016-07-23 NOTE — Discharge Summary (Signed)
[facility] NAME: Theresa Vasquez    MR#:  [id number]  DATE OF BIRTH:  [birth year]  DATE OF ADMISSION:  07/19/2016   ADMITTING PHYSICIAN: Henreitta Leber, MD  DATE OF DISCHARGE: 07/23/16  PRIMARY CARE PHYSICIAN: Park Liter, DO   ADMISSION DIAGNOSIS:   Hypoxia [R09.02] Hospital-acquired pneumonia [J18.9] Dyspnea, unspecified type [R06.00]  DISCHARGE DIAGNOSIS:   Active Problems:   Acute respiratory failure with hypoxia (HCC)   Pressure injury of skin   SECONDARY DIAGNOSIS:   Past Medical History:  Diagnosis Date  . Adenomatous polyps   . Age-related osteoporosis without current pathological fracture   . Anemia   . Anxiety   . Atherosclerosis of aorta ([place])   . Atrial fibrillation with RVR ([place])   . Barrett's esophagus without dysplasia   . CHF (congestive heart failure) ([place])   . Colitis   . COPD (chronic obstructive pulmonary disease) ([place])   . Dementia   . Depression   . Diabetes mellitus type II, uncontrolled ([place])   . Diverticulitis   . Diverticulosis of intestine without perforation or abscess without bleeding   . Edema   . Essential hemorrhagic thrombocythemia ([place])   . Gastrointestinal hemorrhage   . High anion gap metabolic acidosis   . Hyperlipidemia   . Hypertension   . Hypertensive chronic kidney disease with stage 1 through stage 4 chronic kidney disease, or unspecified chronic kidney disease   . Hypothyroidism   . Hypoxemia   . Iron deficiency anemia   . Lactic acidosis   . Left bundle branch block   . Nonrheumatic mitral valve insufficiency   . Osteoarthritis   . Other psychoactive substance dependence, in remission ([place])   . Other specified symptoms and signs involving the circulatory and respiratory systems   . Paroxysmal atrial fibrillation (HCC)    s/p cardioversion  . Personal history of peptic ulcer disease   . Pulmonary edema   . PVD (peripheral vascular disease) ([place])  05/14/2015  . Recurrent pneumonia    history of  . Respiratory failure with hypoxia and hypercapnia ([place])   . Type 2 diabetes mellitus with diabetic chronic kidney disease ([place])   . Type 2 diabetes mellitus with diabetic polyneuropathy ([place])   . Vascular dementia without behavioral disturbance   . Vascular disorder of intestine [facility])     HOSPITAL COURSE:   83 year old female with past medical history of hypertension, hyperlipidemia, diabetes, peripheral vascular disease, history of CHF, COPD, dementia, hypothyroidism who presents to the hospital due to shortness of breath and hypoxia.  1. Acute on chronic respiratory failure with hypoxia-secondary to acute on chronic diastolic CHF and COPD exacerbation. -Required BiPAP in the emergency room, currently on 2.5 L oxygen. Uses nocturnal o2 only at the house. - changed lasix to PO, CXR with CHF findings.  - ECHO from last year with LVH, EF 60% and grade 2 diastolic dysfunction - also on metoprolol and lisinopril  2. COPD exacerbation- improving, change to oral steroids at discharge On augmentin for 3 more days for bronchitis Continue inhalers and nebs  3. History of atrial fibrillation-currently rate controlled. Continue amiodarone, continue metoprolol. Not on anticoagulation  4. Essential hypertension-continue Norvasc, metoprolol, lisinopril.  5. Hypothyroidism-continue Synthroid.  6. Diabetes type 2 without complication- on metformin, lantus and ssi  7. Diabetic neuropathy-continue gabapentin.  8. Dementia-continue Aricept, Depakote and Seroquel. Has anxiety episodes - prn xanax  Physical Therapy consult recommended SNF again which is appropriate.  DISCHARGE CONDITIONS:   Guarded  CONSULTS OBTAINED:    None  DRUG ALLERGIES:   No Known Allergies DISCHARGE MEDICATIONS:   Allergies as of 07/23/2016   No Known Allergies     Medication List    STOP taking these medications   enoxaparin 40 MG/0.4ML  injection Commonly known as:  LOVENOX   nitrofurantoin (macrocrystal-monohydrate) 100 MG capsule Commonly known as:  MACROBID   oxyCODONE 5 MG immediate release tablet Commonly known as:  Oxy IR/ROXICODONE     TAKE these medications   acetaminophen 500 MG tablet Commonly known as:  TYLENOL Take 1 tablet (500 mg total) by mouth every 4 (four) hours as needed for mild pain, fever or headache. What changed:  reasons to take this   albuterol (2.5 MG/3ML) 0.083% nebulizer solution Commonly known as:  PROVENTIL Take 3 mLs (2.5 mg total) by nebulization every 4 (four) hours as needed for wheezing or shortness of breath.   ALPRAZolam 0.25 MG tablet Commonly known as:  XANAX Take 1 tablet (0.25 mg total) by mouth 3 (three) times daily as needed for anxiety.   amiodarone 100 MG tablet Commonly known as:  PACERONE Take 100 mg by mouth daily.   amLODipine 5 MG tablet Commonly known as:  NORVASC Take 1 tablet (5 mg total) by mouth 2 (two) times daily.   amoxicillin-clavulanate 875-125 MG tablet Commonly known as:  AUGMENTIN Take 1 tablet by mouth every 12 (twelve) hours. X 3 more days What changed:  when to take this  additional instructions   aspirin EC 81 MG tablet Take 81 mg by mouth daily.   divalproex 250 MG DR tablet Commonly known as:  DEPAKOTE Take 1 tablet (250 mg total) by mouth 3 (three) times daily.   donepezil 10 MG tablet Commonly known as:  ARICEPT Take 1 tablet (10 mg total) by mouth at bedtime.   ferrous sulfate 325 (65 FE) MG tablet Take 1 tablet (325 mg total) by mouth 2 (two) times daily with a meal.   Fluticasone-Salmeterol 250-50 MCG/DOSE Aepb Commonly known as:  ADVAIR Inhale 1 puff into the lungs 2 (two) times daily.   furosemide 40 MG tablet Commonly known as:  LASIX Take 1 tablet (40 mg total) by mouth 2 (two) times daily.   gabapentin 400 MG capsule Commonly known as:  NEURONTIN TAKE 1 CAPSULE TWICE DAILY   guaiFENesin 600 MG 12 hr  tablet Commonly known as:  MUCINEX Take 1 tablet (600 mg total) by mouth 2 (two) times daily.   guaiFENesin-dextromethorphan 100-10 MG/5ML syrup Commonly known as:  ROBITUSSIN DM Take 5 mLs by mouth every 4 (four) hours as needed for cough.   insulin glargine 100 UNIT/ML injection Commonly known as:  LANTUS Inject 0.1 mLs (10 Units total) into the skin at bedtime.   levothyroxine 175 MCG tablet Commonly known as:  SYNTHROID, LEVOTHROID Take 175 mcg by mouth daily. What changed:  Another medication with the same name was removed. Continue taking this medication, and follow the directions you see here.   lisinopril 20 MG tablet Commonly known as:  PRINIVIL,ZESTRIL Take 1 tablet (20 mg total) by mouth daily. What changed:  when to take this   Melatonin 3 MG Tabs Take 1 tablet (3 mg total) by mouth at bedtime.   metFORMIN 500 MG (MOD) 24 hr tablet Commonly known as:  GLUMETZA Take 1 tablet (500 mg total) by mouth daily with breakfast. What changed:  Another medication with the same name was removed. Continue taking  this medication, and follow the directions you see here.   metoprolol 50 MG tablet Commonly known as:  LOPRESSOR Take 0.5 tablets (25 mg total) by mouth 2 (two) times daily.   MICROLET LANCETS Misc TEST BLOOD GLUCOSE LEVEL ONCE DAILY   pantoprazole 40 MG tablet Commonly known as:  PROTONIX Take 1 tablet (40 mg total) by mouth 2 (two) times daily. What changed:  when to take this   predniSONE 10 MG (21) Tbpk tablet Commonly known as:  STERAPRED UNI-PAK 21 TAB Take 1 tablet (10 mg total) by mouth daily. 6 tabs PO x 1 day 5 tabs PO x 1 day 4 tabs PO x 1 day 3 tabs PO x 1 day 2 tabs PO x 1 day 1 tab PO x 1 day and stop   QUEtiapine 25 MG tablet Commonly known as:  SEROQUEL Take 1 tablet (25 mg total) by mouth at bedtime.   senna-docusate 8.6-50 MG tablet Commonly known as:  Senokot-S Take 1 tablet by mouth at bedtime as needed for mild constipation.    tiotropium 18 MCG inhalation capsule Commonly known as:  SPIRIVA Place 1 capsule (18 mcg total) into inhaler and inhale daily.   venlafaxine XR 37.5 MG 24 hr capsule Commonly known as:  EFFEXOR XR Take 1 capsule (37.5 mg total) by mouth daily with breakfast.   Vitamin D-3 1000 units Caps Take 1 capsule (1,000 Units total) by mouth daily.        DISCHARGE INSTRUCTIONS:   1. PCP f/u in 1 week  DIET:   Cardiac diet  ACTIVITY:   Activity as tolerated  OXYGEN:   Home Oxygen: Yes.    Oxygen Delivery: 3 liters/min via Patient connected to nasal cannula oxygen  DISCHARGE LOCATION:   nursing home   If you experience worsening of your admission symptoms, develop shortness of breath, life threatening emergency, suicidal or homicidal thoughts you must seek medical attention immediately by calling 911 or calling your MD immediately  if symptoms less severe.  You Must read complete instructions/literature along with all the possible adverse reactions/side effects for all the Medicines you take and that have been prescribed to you. Take any new Medicines after you have completely understood and accpet all the possible adverse reactions/side effects.   Please note  You were cared for by a hospitalist during your hospital stay. If you have any questions about your discharge medications or the care you received while you were in the hospital after you are discharged, you can call the unit and asked to speak with the hospitalist on call if the hospitalist that took care of you is not available. Once you are discharged, your primary care physician will handle any further medical issues. Please note that NO REFILLS for any discharge medications will be authorized once you are discharged, as it is imperative that you return to your primary care physician (or establish a relationship with a primary care physician if you do not have one) for your aftercare needs so that they can reassess your  need for medications and monitor your lab values.    On the day of Discharge:  VITAL SIGNS:   Blood pressure (!) 145/60, pulse 65, temperature 98.4 F (36.9 C), temperature source Oral, resp. rate 20, height 5\' 2"  (1.575 m), weight 88.5 kg (195 lb), SpO2 91 %.  PHYSICAL EXAMINATION:    GENERAL:  83 y.o.-year-old patient lying in the bed with no acute distress.  EYES: Pupils equal, round, reactive to light and  accommodation. No scleral icterus. Extraocular muscles intact.  HEENT: Head atraumatic, normocephalic. Oropharynx and nasopharynx clear.  NECK:  Supple, no jugular venous distention. No thyroid enlargement, no tenderness.  LUNGS: improved wheezing and rhonchi at the bases posteriorly. Otherwise no crackles or crepitation. No use of accessory muscles of respiration.  CARDIOVASCULAR: S1, S2 normal. No rubs, or gallops. 3/6 systolic murmur is present ABDOMEN: Soft, nontender, nondistended. Bowel sounds present. No organomegaly or mass.  EXTREMITIES: No pedal edema, cyanosis, or clubbing. Left foot in a boot. NEUROLOGIC: Following commands. No significant new motor or sensory deficits noted. PSYCHIATRIC: The patient is alert and oriented to self.  SKIN: No obvious rash, lesion, or ulcer.   DATA REVIEW:   CBC  Recent Labs Lab 07/20/16 0409  WBC 8.5  HGB 8.3*  HCT 24.7*  PLT 394    Chemistries   Recent Labs Lab 07/22/16 0534  NA 140  K 3.6  CL 98*  CO2 33*  GLUCOSE 261*  BUN 32*  CREATININE 0.80  CALCIUM 8.3*     Microbiology Results  Results for orders placed or performed during the hospital encounter of 07/19/16  Blood culture (routine x 2)     Status: None (Preliminary result)   Collection Time: 07/19/16  8:00 AM  Result Value Ref Range Status   Specimen Description BLOOD RIGHT WRIST  Final   Special Requests BOTTLES DRAWN AEROBIC AND ANAEROBIC ANA2ML AER 6ML  Final   Culture NO GROWTH 4 DAYS  Final   Report Status PENDING  Incomplete  Blood culture  (routine x 2)     Status: None (Preliminary result)   Collection Time: 07/19/16  8:00 AM  Result Value Ref Range Status   Specimen Description BLOOD LEFT ARM  Final   Special Requests   Final    BOTTLES DRAWN AEROBIC AND ANAEROBIC ANA13ML AER14ML   Culture NO GROWTH 4 DAYS  Final   Report Status PENDING  Incomplete  MRSA PCR Screening     Status: None   Collection Time: 07/19/16  3:01 PM  Result Value Ref Range Status   MRSA by PCR NEGATIVE NEGATIVE Final    Comment:        The GeneXpert MRSA Assay (FDA approved for NASAL specimens only), is one component of a comprehensive MRSA colonization surveillance program. It is not intended to diagnose MRSA infection nor to guide or monitor treatment for MRSA infections.     RADIOLOGY:  Dg Foot 2 Views Left  Result Date: 07/23/2016 CLINICAL DATA:  Plantar ulcer, hindfoot region EXAM: LEFT FOOT - 2 VIEW COMPARISON:  July 21, 2016 FINDINGS: Frontal and lateral views were obtained. Bones are diffusely osteoporotic. There is no acute fracture or dislocation. No erosive change or bony destruction. There is narrowing of all MTP, PIP, and DIP joints. There is marked narrowing and bony overgrowth at the talonavicular joint, stable. There is no soft tissue abscess or calcification. IMPRESSION: Multifocal arthropathy, most marked in the talonavicular joint. Bones osteoporotic. No erosive change or bony destruction. No acute fracture or dislocation. No soft tissue ulceration or mass is evident by radiography. Appearance is essentially stable compared to recent prior study. Electronically Signed   By: Lowella Grip III M.D.   On: 07/23/2016 14:52     Management plans discussed with the patient, family and they are in agreement.  CODE STATUS:     Code Status Orders        Start     Ordered   07/19/16 1427  Full code  Continuous     07/19/16 1426    Code Status History    Date Active Date Inactive Code Status Order ID Comments User  Context   06/18/2016  1:23 AM 06/18/2016  9:47 PM Full Code [id number]  Dereck Leep, MD Inpatient   06/18/2016  1:22 AM 06/18/2016  1:23 AM Full Code [id number]  Lance Coon, MD Inpatient   05/18/2016  1:24 PM 05/20/2016  3:41 PM Full Code [id number]  Loletha Grayer, MD ED   07/18/2015  7:41 AM 07/22/2015  7:46 PM Full Code [id number]  Theodoro Grist, MD ED   05/14/2015  7:37 PM 05/18/2015  1:48 PM Full Code [id number]  Gladstone Lighter, MD Inpatient    Advance Directive Documentation   Flowsheet Row Most Recent Value  Type of Advance Directive  Healthcare Power of Attorney, Living will  Pre-existing out of facility DNR order (yellow form or pink MOST form)  No data  "MOST" Form in Place?  No data      TOTAL TIME TAKING CARE OF THIS PATIENT: 38 minutes.    Gladstone Lighter M.D on 07/23/2016 at 3:20 PM  Between 7am to 6pm - Pager - [phone]  After 6pm go to [email or web address] - Proofreader  [facility]  Office  [phone]  CC: Primary care physician; Park Liter, DO   Note: This dictation was prepared with Dragon dictation along with smaller phrase technology. Any transcriptional errors that result from this process are unintentional.

## 2016-07-23 NOTE — Progress Notes (Signed)
Pt for discharge back to [facility] via ems. Alert. No resp distress. 02 cont in use  Chronically.  Chair . Report called to [place]  [facility] Sl dc/d.  Son at bedside.  Ems called to transport pt.

## 2016-07-24 ENCOUNTER — Telehealth: Payer: Self-pay | Admitting: Family Medicine

## 2016-07-24 LAB — CULTURE, BLOOD (ROUTINE X 2)
CULTURE: NO GROWTH
Culture: NO GROWTH

## 2016-07-24 NOTE — Telephone Encounter (Signed)
LMOM to call back to schedule hospital follow up for pt.

## 2016-08-03 ENCOUNTER — Ambulatory Visit: Payer: Medicare Other | Admitting: Family

## 2016-08-07 ENCOUNTER — Ambulatory Visit: Payer: Medicare Other | Admitting: Family Medicine

## 2016-08-07 NOTE — Progress Notes (Deleted)
There were no vitals taken for this visit.   Subjective:    Patient ID: Theresa Vasquez, female    DOB: 1931/02/10, 83 y.o.   MRN: [id number]  HPI: Theresa Vasquez is a 83 y.o. female  No chief complaint on file.  HOSPITAL FOLLOW UP Time since discharge: 15 days- prior to that had been at [facility] since a fall and hip fracture in late November 2017. Hospital/facility: [facility] Diagnosis: Hospital acquired pneumonia, acute respiratory failure, pressure ulcer, COPD exacerbation Procedures/tests: X-rays (including L foot for pressure ulcer) Consultants: wound care New medications: augmentin x1 week, prednisone taper Discharge instructions:  To SNF, home on oxygen 3L continuous [place] Status: {Blank multiple:19196::"better","worse","stable","fluctuating"}    Relevant past medical, surgical, family and social history reviewed and updated as indicated. Interim medical history since our last visit reviewed. Allergies and medications reviewed and updated.  Review of Systems  Per HPI unless specifically indicated above     Objective:    There were no vitals taken for this visit.  Wt Readings from Last 3 Encounters:  07/19/16 195 lb (88.5 kg)  06/18/16 201 lb (91.2 kg)  06/01/16 191 lb (86.6 kg)    Physical Exam  Results for orders placed or performed during the hospital encounter of 07/19/16  Blood culture (routine x 2)  Result Value Ref Range   Specimen Description BLOOD RIGHT WRIST    Special Requests BOTTLES DRAWN AEROBIC AND ANAEROBIC ANA2ML AER 6ML    Culture NO GROWTH 5 DAYS    Report Status 07/24/2016 FINAL   Blood culture (routine x 2)  Result Value Ref Range   Specimen Description BLOOD LEFT ARM    Special Requests      BOTTLES DRAWN AEROBIC AND ANAEROBIC ANA13ML AER14ML   Culture NO GROWTH 5 DAYS    Report Status 07/24/2016 FINAL   MRSA PCR Screening  Result Value Ref Range   MRSA by PCR NEGATIVE NEGATIVE  CBC  Result Value Ref Range   WBC 14.7 (H) 3.6  - 11.0 K/uL   RBC 3.31 (L) 3.80 - 5.20 MIL/uL   Hemoglobin 10.0 (L) 12.0 - 16.0 g/dL   HCT 30.5 (L) 35.0 - 47.0 %   MCV 92.1 80.0 - 100.0 fL   MCH 30.2 26.0 - 34.0 pg   MCHC 32.7 32.0 - 36.0 g/dL   RDW 17.9 (H) 11.5 - 14.5 %   Platelets 443 (H) 150 - 440 K/uL  Basic metabolic panel  Result Value Ref Range   Sodium 140 135 - 145 mmol/L   Potassium 3.5 3.5 - 5.1 mmol/L   Chloride 101 101 - 111 mmol/L   CO2 31 22 - 32 mmol/L   Glucose, Bld 223 (H) 65 - 99 mg/dL   BUN 10 6 - 20 mg/dL   Creatinine, Ser 0.68 0.44 - 1.00 mg/dL   Calcium 8.3 (L) 8.9 - 10.3 mg/dL   GFR calc non Af Amer >60 >60 mL/min   GFR calc Af Amer >60 >60 mL/min   Anion gap 8 5 - 15  Troponin I  Result Value Ref Range   Troponin I <0.03 <0.03 ng/mL  Brain natriuretic peptide  Result Value Ref Range   B Natriuretic Peptide 403.0 (H) 0.0 - 100.0 pg/mL  Lactic acid, plasma  Result Value Ref Range   Lactic Acid, Venous 2.4 (HH) 0.5 - 1.9 mmol/L  Lactic acid, plasma  Result Value Ref Range   Lactic Acid, Venous 3.0 (HH) 0.5 - 1.9 mmol/L  Glucose,  capillary  Result Value Ref Range   Glucose-Capillary 245 (H) 65 - 99 mg/dL   Comment 1 Notify RN   Basic metabolic panel  Result Value Ref Range   Sodium 140 135 - 145 mmol/L   Potassium 3.7 3.5 - 5.1 mmol/L   Chloride 100 (L) 101 - 111 mmol/L   CO2 32 22 - 32 mmol/L   Glucose, Bld 154 (H) 65 - 99 mg/dL   BUN 15 6 - 20 mg/dL   Creatinine, Ser 0.70 0.44 - 1.00 mg/dL   Calcium 8.0 (L) 8.9 - 10.3 mg/dL   GFR calc non Af Amer >60 >60 mL/min   GFR calc Af Amer >60 >60 mL/min   Anion gap 8 5 - 15  CBC  Result Value Ref Range   WBC 8.5 3.6 - 11.0 K/uL   RBC 2.72 (L) 3.80 - 5.20 MIL/uL   Hemoglobin 8.3 (L) 12.0 - 16.0 g/dL   HCT 24.7 (L) 35.0 - 47.0 %   MCV 90.8 80.0 - 100.0 fL   MCH 30.6 26.0 - 34.0 pg   MCHC 33.7 32.0 - 36.0 g/dL   RDW 17.9 (H) 11.5 - 14.5 %   Platelets 394 150 - 440 K/uL  Glucose, capillary  Result Value Ref Range   Glucose-Capillary 245  (H) 65 - 99 mg/dL  Glucose, capillary  Result Value Ref Range   Glucose-Capillary 210 (H) 65 - 99 mg/dL  Glucose, capillary  Result Value Ref Range   Glucose-Capillary 119 (H) 65 - 99 mg/dL  Procalcitonin - Baseline  Result Value Ref Range   Procalcitonin 0.12 ng/mL  Lactic acid, plasma  Result Value Ref Range   Lactic Acid, Venous 2.5 (HH) 0.5 - 1.9 mmol/L  Lactic acid, plasma  Result Value Ref Range   Lactic Acid, Venous 2.6 (HH) 0.5 - 1.9 mmol/L  Glucose, capillary  Result Value Ref Range   Glucose-Capillary 214 (H) 65 - 99 mg/dL  Glucose, capillary  Result Value Ref Range   Glucose-Capillary 368 (H) 65 - 99 mg/dL  Glucose, capillary  Result Value Ref Range   Glucose-Capillary 297 (H) 65 - 99 mg/dL  Glucose, capillary  Result Value Ref Range   Glucose-Capillary 190 (H) 65 - 99 mg/dL  Glucose, capillary  Result Value Ref Range   Glucose-Capillary 300 (H) 65 - 99 mg/dL  Glucose, capillary  Result Value Ref Range   Glucose-Capillary 297 (H) 65 - 99 mg/dL  Procalcitonin  Result Value Ref Range   Procalcitonin <0.10 ng/mL  Glucose, capillary  Result Value Ref Range   Glucose-Capillary 256 (H) 65 - 99 mg/dL  Basic metabolic panel  Result Value Ref Range   Sodium 140 135 - 145 mmol/L   Potassium 3.6 3.5 - 5.1 mmol/L   Chloride 98 (L) 101 - 111 mmol/L   CO2 33 (H) 22 - 32 mmol/L   Glucose, Bld 261 (H) 65 - 99 mg/dL   BUN 32 (H) 6 - 20 mg/dL   Creatinine, Ser 0.80 0.44 - 1.00 mg/dL   Calcium 8.3 (L) 8.9 - 10.3 mg/dL   GFR calc non Af Amer >60 >60 mL/min   GFR calc Af Amer >60 >60 mL/min   Anion gap 9 5 - 15  Glucose, capillary  Result Value Ref Range   Glucose-Capillary 232 (H) 65 - 99 mg/dL  Glucose, capillary  Result Value Ref Range   Glucose-Capillary 253 (H) 65 - 99 mg/dL  Glucose, capillary  Result Value Ref Range   Glucose-Capillary 218 (  H) 65 - 99 mg/dL  Glucose, capillary  Result Value Ref Range   Glucose-Capillary 322 (H) 65 - 99 mg/dL  Glucose,  capillary  Result Value Ref Range   Glucose-Capillary 223 (H) 65 - 99 mg/dL  Glucose, capillary  Result Value Ref Range   Glucose-Capillary 224 (H) 65 - 99 mg/dL  Glucose, capillary  Result Value Ref Range   Glucose-Capillary 319 (H) 65 - 99 mg/dL      Assessment & Plan:   Problem List Items Addressed This Visit    None       Follow up plan: No Follow-up on file.

## 2016-08-10 ENCOUNTER — Telehealth: Payer: Self-pay | Admitting: Family Medicine

## 2016-08-10 ENCOUNTER — Encounter: Payer: Self-pay | Admitting: Family

## 2016-08-10 ENCOUNTER — Ambulatory Visit: Payer: Medicare Other | Attending: Family | Admitting: Family

## 2016-08-10 VITALS — BP 116/44 | HR 80 | Temp 100.4°F | Resp 18 | Ht 64.0 in | Wt 194.0 lb

## 2016-08-10 DIAGNOSIS — I10 Essential (primary) hypertension: Secondary | ICD-10-CM

## 2016-08-10 DIAGNOSIS — L89629 Pressure ulcer of left heel, unspecified stage: Secondary | ICD-10-CM | POA: Insufficient documentation

## 2016-08-10 DIAGNOSIS — I130 Hypertensive heart and chronic kidney disease with heart failure and stage 1 through stage 4 chronic kidney disease, or unspecified chronic kidney disease: Secondary | ICD-10-CM | POA: Insufficient documentation

## 2016-08-10 DIAGNOSIS — Z9889 Other specified postprocedural states: Secondary | ICD-10-CM | POA: Diagnosis not present

## 2016-08-10 DIAGNOSIS — Z87891 Personal history of nicotine dependence: Secondary | ICD-10-CM | POA: Diagnosis not present

## 2016-08-10 DIAGNOSIS — I5032 Chronic diastolic (congestive) heart failure: Secondary | ICD-10-CM | POA: Insufficient documentation

## 2016-08-10 DIAGNOSIS — Z7982 Long term (current) use of aspirin: Secondary | ICD-10-CM | POA: Insufficient documentation

## 2016-08-10 DIAGNOSIS — Z7984 Long term (current) use of oral hypoglycemic drugs: Secondary | ICD-10-CM | POA: Insufficient documentation

## 2016-08-10 DIAGNOSIS — I480 Paroxysmal atrial fibrillation: Secondary | ICD-10-CM | POA: Diagnosis not present

## 2016-08-10 DIAGNOSIS — Z5189 Encounter for other specified aftercare: Secondary | ICD-10-CM | POA: Insufficient documentation

## 2016-08-10 DIAGNOSIS — Z90710 Acquired absence of both cervix and uterus: Secondary | ICD-10-CM | POA: Insufficient documentation

## 2016-08-10 DIAGNOSIS — Z79899 Other long term (current) drug therapy: Secondary | ICD-10-CM | POA: Insufficient documentation

## 2016-08-10 LAB — BASIC METABOLIC PANEL
ANION GAP: 11 (ref 5–15)
BUN: 12 mg/dL (ref 6–20)
CO2: 29 mmol/L (ref 22–32)
Calcium: 8.2 mg/dL — ABNORMAL LOW (ref 8.9–10.3)
Chloride: 101 mmol/L (ref 101–111)
Creatinine, Ser: 0.93 mg/dL (ref 0.44–1.00)
GFR calc Af Amer: >60 mL/min (ref >60)
GFR, EST NON AFRICAN AMERICAN: 55 mL/min — AB (ref >60)
Glucose, Bld: 143 mg/dL — ABNORMAL HIGH (ref 65–99)
POTASSIUM: 3.4 mmol/L — AB (ref 3.5–5.1)
SODIUM: 141 mmol/L (ref 135–145)

## 2016-08-10 NOTE — Telephone Encounter (Signed)
Verbal order given  

## 2016-08-10 NOTE — Telephone Encounter (Signed)
Estill Bamberg with [facility] would like to get Dr Wynetta Emery to sign an order for home health orders for wound care on this patient.   Estill Bamberg [phone]  Thank Dennis Bast  Santiago Glad

## 2016-08-10 NOTE — Telephone Encounter (Signed)
ERROR

## 2016-08-10 NOTE — Telephone Encounter (Signed)
Is it ok to give a verbal for this?

## 2016-08-10 NOTE — Telephone Encounter (Signed)
Estill Bamberg called back and would like a verbal order to complete paperwork and then it will be sent to [place].

## 2016-08-10 NOTE — Progress Notes (Signed)
Subjective:    Patient ID: Theresa Vasquez, female    DOB: 1930-11-02, 83 y.o.   MRN: [id number]  HPI  Theresa Vasquez is a 83 y/o female with a history of dementia, DM, PVD, paroxymal atrial fibrillation, anemia, hypothyroidism, HTN, hyperlipidemia, GI bleed, depression, COPD (oxygen dependent), anxiety, remote tobacco use and chronic heart failure.   Last echo was done 05/15/15 and showed an EF of 60-65% along with mild/mod aortic stenosis and mild AR/MR. EF has increased from 35% November 2015.  Was last admitted 07/19/16 due to acute exacerbation of her HF & COPD. Was given IV diuretics and steroids. Initially required bipap. Was discharged on antibiotics and prednisone. Previous admission was on 06/17/16 due to a left hip fracture that required surgery. Discharged to skilled nursing for rehab.  She presents today for her initial visit with fatigue and shortness of breath with little exertion. Her family & caregiver said that earlier in the day, she wasn't acting "right" and seemed to be more weak then usual. Those symptoms have since resolved. Admits to doing very little activity because of a current pressure ulcer on her left heel. Weighing infrequently due to inability to stand on the left heel.   Past Medical History:  Diagnosis Date  . Adenomatous polyps   . Age-related osteoporosis without current pathological fracture   . Anemia   . Anxiety   . Atherosclerosis of aorta ([place])   . Atrial fibrillation with RVR ([place])   . Barrett's esophagus without dysplasia   . CHF (congestive heart failure) ([place])   . Colitis   . COPD (chronic obstructive pulmonary disease) ([place])   . Dementia   . Depression   . Diabetes mellitus type II, uncontrolled ([place])   . Diverticulitis   . Diverticulosis of intestine without perforation or abscess without bleeding   . Edema   . Essential hemorrhagic thrombocythemia ([place])   . Gastrointestinal hemorrhage   . High anion gap metabolic acidosis   .  Hyperlipidemia   . Hypertension   . Hypertensive chronic kidney disease with stage 1 through stage 4 chronic kidney disease, or unspecified chronic kidney disease   . Hypothyroidism   . Hypoxemia   . Iron deficiency anemia   . Lactic acidosis   . Left bundle branch block   . Nonrheumatic mitral valve insufficiency   . Osteoarthritis   . Other psychoactive substance dependence, in remission ([place])   . Other specified symptoms and signs involving the circulatory and respiratory systems   . Paroxysmal atrial fibrillation (HCC)    s/p cardioversion  . Personal history of peptic ulcer disease   . Pulmonary edema   . PVD (peripheral vascular disease) ([place]) 05/14/2015  . Recurrent pneumonia    history of  . Respiratory failure with hypoxia and hypercapnia ([place])   . Type 2 diabetes mellitus with diabetic chronic kidney disease ([place])   . Type 2 diabetes mellitus with diabetic polyneuropathy ([place])   . Vascular dementia without behavioral disturbance   . Vascular disorder of intestine [facility])    Past Surgical History:  Procedure Laterality Date  . ABDOMINAL HYSTERECTOMY     partial  . APPENDECTOMY    . COLONOSCOPY    . ESOPHAGOGASTRODUODENOSCOPY    . ESOPHAGOGASTRODUODENOSCOPY (EGD) WITH PROPOFOL N/A 07/04/2015   Procedure: ESOPHAGOGASTRODUODENOSCOPY (EGD) WITH PROPOFOL;  Surgeon: Manya Silvas, MD;  Location: [facility] ENDOSCOPY;  Service: Endoscopy;  Laterality: N/A;  . HIP SURGERY Left 06/2016   Rod placed   . INTRAMEDULLARY (IM)  NAIL INTERTROCHANTERIC Left 06/18/2016   Procedure: INTRAMEDULLARY (IM) NAIL INTERTROCHANTRIC;  Surgeon: Dereck Leep, MD;  Location: [facility] ORS;  Service: Orthopedics;  Laterality: Left;  . SHOULDER ARTHROSCOPY    . SIGMOIDOSCOPY     Family History  Problem Relation Age of Onset  . Diabetes Mother   . Diabetes Father    Social History  Substance Use Topics  . Smoking status: Former Smoker    Packs/day: 1.00    Years: 60.00    Types: Cigarettes  .  Smokeless tobacco: Never Used  . Alcohol use No   No Known Allergies  Prior to Admission medications   Medication Sig Start Date End Date Taking? Authorizing Provider  acetaminophen (TYLENOL) 500 MG tablet Take 1 tablet (500 mg total) by mouth every 4 (four) hours as needed for mild pain, fever or headache. 07/23/16  Yes Gladstone Lighter, MD  albuterol (PROVENTIL) (2.5 MG/3ML) 0.083% nebulizer solution Take 3 mLs (2.5 mg total) by nebulization every 4 (four) hours as needed for wheezing or shortness of breath. 11/26/15  Yes Megan P Johnson, DO  amiodarone (PACERONE) 100 MG tablet Take 100 mg by mouth daily.    Yes Historical Provider, MD  amLODipine (NORVASC) 5 MG tablet Take 1 tablet (5 mg total) by mouth 2 (two) times daily. 11/26/15  Yes Megan P Johnson, DO  aspirin EC 81 MG tablet Take 81 mg by mouth daily.    Yes Historical Provider, MD  Cholecalciferol (VITAMIN D-3) 1000 units CAPS Take 1 capsule (1,000 Units total) by mouth daily. 11/26/15  Yes Megan P Johnson, DO  divalproex (DEPAKOTE) 250 MG DR tablet Take 1 tablet (250 mg total) by mouth 3 (three) times daily. 11/26/15  Yes Megan P Johnson, DO  donepezil (ARICEPT) 10 MG tablet Take 1 tablet (10 mg total) by mouth at bedtime. 11/26/15  Yes Megan P Johnson, DO  ferrous sulfate 325 (65 FE) MG tablet Take 1 tablet (325 mg total) by mouth 2 (two) times daily with a meal. 11/26/15  Yes Megan P Johnson, DO  Fluticasone-Salmeterol (ADVAIR) 250-50 MCG/DOSE AEPB Inhale 1 puff into the lungs 2 (two) times daily. 11/26/15  Yes Megan P Johnson, DO  furosemide (LASIX) 40 MG tablet Take 1 tablet (40 mg total) by mouth 2 (two) times daily. 11/26/15  Yes Megan P Johnson, DO  gabapentin (NEURONTIN) 400 MG capsule TAKE 1 CAPSULE TWICE DAILY 07/14/16  Yes Volney American, PA-C  levothyroxine (SYNTHROID, LEVOTHROID) 175 MCG tablet Take 175 mcg by mouth daily.   Yes Historical Provider, MD  lisinopril (PRINIVIL,ZESTRIL) 20 MG tablet Take 1 tablet (20 mg total) by mouth  daily. Patient taking differently: Take 20 mg by mouth 2 (two) times daily.  07/23/16  Yes Gladstone Lighter, MD  Melatonin 3 MG TABS Take 1 tablet (3 mg total) by mouth at bedtime. 05/28/15  Yes Megan P Johnson, DO  metFORMIN (GLUMETZA) 500 MG (MOD) 24 hr tablet Take 1 tablet (500 mg total) by mouth daily with breakfast. 11/26/15  Yes Megan P Johnson, DO  metoprolol (LOPRESSOR) 50 MG tablet Take 0.5 tablets (25 mg total) by mouth 2 (two) times daily. 11/26/15  Yes Megan P Johnson, DO  MICROLET LANCETS MISC TEST BLOOD GLUCOSE LEVEL ONCE DAILY 04/10/16  Yes Megan P Johnson, DO  pantoprazole (PROTONIX) 40 MG tablet Take 1 tablet (40 mg total) by mouth 2 (two) times daily. Patient taking differently: Take 40 mg by mouth daily.  11/26/15  Yes Megan P Johnson, DO  QUEtiapine (  SEROQUEL) 25 MG tablet Take 1 tablet (25 mg total) by mouth at bedtime. 11/26/15  Yes Megan P Johnson, DO  senna-docusate (SENOKOT-S) 8.6-50 MG tablet Take 1 tablet by mouth at bedtime as needed for mild constipation. 06/23/16  Yes Demetrios Loll, MD  tiotropium (SPIRIVA) 18 MCG inhalation capsule Place 1 capsule (18 mcg total) into inhaler and inhale daily. 11/26/15  Yes Megan P Johnson, DO  venlafaxine XR (EFFEXOR XR) 37.5 MG 24 hr capsule Take 1 capsule (37.5 mg total) by mouth daily with breakfast. 11/26/15  Yes [place], DO     Review of Systems  Constitutional: Positive for fatigue. Negative for appetite change.  HENT: Negative for congestion, postnasal drip and sore throat.   Eyes: Negative.   Respiratory: Positive for shortness of breath. Negative for cough, chest tightness and wheezing.   Cardiovascular: Negative for chest pain, palpitations and leg swelling.  Gastrointestinal: Negative for abdominal distention and abdominal pain.  Endocrine: Negative.   Genitourinary: Negative.   Musculoskeletal: Positive for back pain. Negative for neck pain.  Skin: Positive for wound (pressure ulcer on left heel). Negative for rash.   Allergic/Immunologic: Negative.   Neurological: Positive for weakness (earlier today). Negative for dizziness and light-headedness.  Hematological: Negative for adenopathy. Bruises/bleeds easily.  Psychiatric/Behavioral: Negative for dysphoric mood and sleep disturbance (sleeping on 1 pillow along with oxygen). The patient is nervous/anxious.    Vitals:   08/10/16 1258  BP: (!) 116/44  Pulse: 80  Resp: 18  Temp: (!) 100.4 F (38 C)  TempSrc: Oral  SpO2: 93%  Weight: 194 lb (88 kg)  Height: 5\' 4"  (1.626 m)   Wt Readings from Last 3 Encounters:  08/10/16 194 lb (88 kg)  07/19/16 195 lb (88.5 kg)  06/18/16 201 lb (91.2 kg)   Lab Results  Component Value Date   CREATININE 0.93 08/10/2016   CREATININE 0.80 07/22/2016   CREATININE 0.70 07/20/2016       Objective:   Physical Exam  Constitutional: She is oriented to person, place, and time. She appears well-developed and well-nourished.  HENT:  Head: Normocephalic and atraumatic.  Eyes: Conjunctivae are normal. Pupils are equal, round, and reactive to light.  Neck: Normal range of motion. Neck supple. No JVD present.  Cardiovascular: Normal rate and regular rhythm.   Pulmonary/Chest: Effort normal. She has no wheezes. She has no rales.  Abdominal: Soft. She exhibits no distension. There is no tenderness.  Musculoskeletal: She exhibits no edema or tenderness.  Neurological: She is alert and oriented to person, place, and time.  Skin: Skin is warm.  Currently has a boot on the left foot due to stated pressure ulcer on the left heel  Psychiatric: She has a normal mood and affect. Her behavior is normal.  Nursing note and vitals reviewed.      Assessment & Plan:  1: Chronic heart failure with preserved ejection fraction- - NYHA class III - euvolemic - not weighing daily due to inability to stand due to pressure ulcer. Encouraged caregiver & son to weigh her as often as she safely can. Ideally it would be daily so that they  could call for an overnight weight gain of >2 pounds or a weekly weight gain of >5 pounds - not adding salt. Discussed the importance of closely following a 2000mg  sodium diet and written information was given to them. Son does the shopping and is already reading food labels for sodium content.  - has home health coming out to change the  dressing on her heel - last saw cardiologist Ubaldo Glassing) on 06/02/16 and is due to return May 2018 - will decrease her furosemide to once daily. Instructed son & caregiver that she could take the 2nd dose if she develops edema, worsening shortness of breath or weight gain - BMP drawn today as well  2: HTN- - BP on the low side today - decreasing furosemide to daily - denies dizziness - last saw PCP Park Liter) 06/01/16  3: Paroxysmal atrial fibrillation- - currently rate controlled at this time - on amiodarone, amlodipine and metoprolol  - not on anticoagulation due to previous GI bleed  4: Decubitus ulcer of left heel- - wearing a boot at this time - may be going to the wound center if it doesn't continue to heal  Return here in 1 month or sooner for any questions/problems before then.

## 2016-08-10 NOTE — Patient Instructions (Addendum)
Resume weighing daily and call for an overnight weight gain of > 2 pounds or a weekly weight gain of >5 pounds.   Decrease fluid pill to once daily

## 2016-08-10 NOTE — Telephone Encounter (Signed)
OK to give verbal.

## 2016-08-10 NOTE — Telephone Encounter (Signed)
Will wait to receive paper work 

## 2016-08-11 ENCOUNTER — Telehealth: Payer: Self-pay | Admitting: Family

## 2016-08-11 DIAGNOSIS — I10 Essential (primary) hypertension: Secondary | ICD-10-CM | POA: Insufficient documentation

## 2016-08-11 NOTE — Telephone Encounter (Signed)
Spoke with patient's son and advised him of lab work that was obtained on 08/10/16. Renal function looks good and potassium is slightly low at 3.4. I decreased her furosemide to once daily (from twice daily) yesterday so her potassium level should recover. However, if banana's don't affect her glucose levels too much, she can eat one of those daily.   We can recheck it when she returns unless it gets rechecked elsewhere. Son was appreciative of the call.

## 2016-09-01 ENCOUNTER — Encounter: Payer: Self-pay | Admitting: Family Medicine

## 2016-09-01 ENCOUNTER — Ambulatory Visit (INDEPENDENT_AMBULATORY_CARE_PROVIDER_SITE_OTHER): Payer: Medicare Other | Admitting: Family Medicine

## 2016-09-01 VITALS — BP 128/69 | HR 67 | Temp 98.6°F | Resp 17 | Ht 64.0 in | Wt 187.0 lb

## 2016-09-01 DIAGNOSIS — E039 Hypothyroidism, unspecified: Secondary | ICD-10-CM | POA: Diagnosis not present

## 2016-09-01 DIAGNOSIS — N182 Chronic kidney disease, stage 2 (mild): Secondary | ICD-10-CM

## 2016-09-01 DIAGNOSIS — I130 Hypertensive heart and chronic kidney disease with heart failure and stage 1 through stage 4 chronic kidney disease, or unspecified chronic kidney disease: Secondary | ICD-10-CM | POA: Diagnosis not present

## 2016-09-01 DIAGNOSIS — E1142 Type 2 diabetes mellitus with diabetic polyneuropathy: Secondary | ICD-10-CM | POA: Diagnosis not present

## 2016-09-01 DIAGNOSIS — D500 Iron deficiency anemia secondary to blood loss (chronic): Secondary | ICD-10-CM

## 2016-09-01 DIAGNOSIS — F01518 Vascular dementia, unspecified severity, with other behavioral disturbance: Secondary | ICD-10-CM

## 2016-09-01 DIAGNOSIS — E1122 Type 2 diabetes mellitus with diabetic chronic kidney disease: Secondary | ICD-10-CM

## 2016-09-01 DIAGNOSIS — I129 Hypertensive chronic kidney disease with stage 1 through stage 4 chronic kidney disease, or unspecified chronic kidney disease: Secondary | ICD-10-CM

## 2016-09-01 DIAGNOSIS — J449 Chronic obstructive pulmonary disease, unspecified: Secondary | ICD-10-CM

## 2016-09-01 DIAGNOSIS — F0151 Vascular dementia with behavioral disturbance: Secondary | ICD-10-CM | POA: Diagnosis not present

## 2016-09-01 DIAGNOSIS — L89629 Pressure ulcer of left heel, unspecified stage: Secondary | ICD-10-CM | POA: Diagnosis not present

## 2016-09-01 NOTE — Assessment & Plan Note (Signed)
Rechecking levels today. Will adjust labs as needed.

## 2016-09-01 NOTE — Assessment & Plan Note (Signed)
Stable. Continue oxygen. Continue to monitor. Call with any concerns.

## 2016-09-01 NOTE — Assessment & Plan Note (Signed)
Under good control. Continue current regimen. Continue to monitor.  

## 2016-09-01 NOTE — Assessment & Plan Note (Signed)
Continue to follow with cardiology. Monitoring her weights at home. Continue to monitor.

## 2016-09-01 NOTE — Assessment & Plan Note (Signed)
Rechecking levels today. Await results and adjust as needed.

## 2016-09-01 NOTE — Assessment & Plan Note (Signed)
Continue wound care with home health. If not improving, will get her into wound care.

## 2016-09-01 NOTE — Progress Notes (Signed)
BP 128/69 (BP Location: Left Arm, Patient Position: Sitting, Cuff Size: Normal)   Pulse 67   Temp 98.6 F (37 C) (Oral)   Resp 17   Ht 5\' 4"  (1.626 m)   Wt 187 lb (84.8 kg)   SpO2 95%   BMI 32.10 kg/m    Subjective:    Patient ID: Theresa Vasquez, female    DOB: Sep 16, 1930, 83 y.o.   MRN: [id number]  HPI: Theresa Vasquez is a 83 y.o. female  Chief Complaint  Patient presents with  . Diabetes    3 month follow up   [place] Time since discharge: 6 weeks, was in Rehab, previously to that was hospitalized for broken hip in November 2017 Hospital/facility: [facility] Diagnosis: CAP, hypoxia, pressure ulcer L heel, CHF exacerbation HOSPITAL COURSE:   83 year old female with past medical history of hypertension, hyperlipidemia, diabetes, peripheral vascular disease, history of CHF, COPD, dementia, hypothyroidism who presents to the hospital due to shortness of breath and hypoxia.  1. Acute on chronic respiratory failure with hypoxia-secondary to acute on chronic diastolic CHF and COPD exacerbation. -Required BiPAP in the emergency room, currently on 2.5 L oxygen. Uses nocturnal o2 only at the house. - changed lasix to PO, CXR with CHF findings. - ECHO from last year with LVH, EF 60% and grade 2 diastolic dysfunction - also on metoprolol and lisinopril  2. COPD exacerbation- improving, change to oral steroids at discharge On augmentin for 3 more days for bronchitis Continue inhalers and nebs  3. History of atrial fibrillation-currently rate controlled. Continue amiodarone, continue metoprolol. Not on anticoagulation  4. Essential hypertension-continue Norvasc, metoprolol, lisinopril.  5. Hypothyroidism-continue Synthroid.  6. Diabetes type 2 without complication-on metformin, lantus and ssi  7. Diabetic neuropathy-continue gabapentin.  8. Dementia-continue Aricept, Depakote and Seroquel. Has anxiety episodes - prn xanax  Physical Therapy consult  recommended SNF again which is appropriate.  Discharge instructions:  Follow up here in 1 week. Has seen Darylene Price at CHF clinic- trying to increase weighing.  Status: better  HYPERTENSION / HYPERLIPIDEMIA Satisfied with current treatment? yes Duration of hypertension: chronic BP monitoring frequency: a few times a week BP medication side effects: no Duration of hyperlipidemia: chronic Cholesterol medication side effects: no Cholesterol supplements: none Past cholesterol medications: Currently not on anything due to dementia Medication compliance: excellent compliance Aspirin: yes Recent stressors: no Recurrent headaches: no Visual changes: no Palpitations: no Dyspnea: no Chest pain: no Lower extremity edema: no Dizzy/lightheaded: no   DIABETES Hypoglycemic episodes:no Polydipsia/polyuria: no Visual disturbance: no Chest pain: no Paresthesias: no Glucose Monitoring: yes Taking Insulin?: no Blood Pressure Monitoring: not checking Retinal Examination: Not up to Date Foot Exam: Up to Date Diabetic Education: Completed Pneumovax: Up to Date Influenza: Up to Date Aspirin: yes  Relevant past medical, surgical, family and social history reviewed and updated as indicated. Interim medical history since our last visit reviewed. Allergies and medications reviewed and updated.  Review of Systems  Constitutional: Negative.   Respiratory: Negative.   Cardiovascular: Negative.   Skin: Positive for wound. Negative for color change, pallor and rash.  Psychiatric/Behavioral: Negative.     Per HPI unless specifically indicated above     Objective:    BP 128/69 (BP Location: Left Arm, Patient Position: Sitting, Cuff Size: Normal)   Pulse 67   Temp 98.6 F (37 C) (Oral)   Resp 17   Ht 5\' 4"  (1.626 m)   Wt 187 lb (84.8 kg)   SpO2 95%  BMI 32.10 kg/m   Wt Readings from Last 3 Encounters:  09/01/16 187 lb (84.8 kg)  08/10/16 194 lb (88 kg)  07/19/16 195 lb (88.5 kg)      Physical Exam  Constitutional: She is oriented to person, place, and time. She appears well-developed and well-nourished. No distress.  HENT:  Head: Normocephalic and atraumatic.  Right Ear: Hearing normal.  Left Ear: Hearing normal.  Nose: Nose normal.  Eyes: Conjunctivae and lids are normal. Right eye exhibits no discharge. Left eye exhibits no discharge. No scleral icterus.  Cardiovascular: Normal rate, regular rhythm and intact distal pulses.  Exam reveals no gallop and no friction rub.   Murmur heard. Pulmonary/Chest: Effort normal and breath sounds normal. No respiratory distress. She has no wheezes. She has no rales. She exhibits no tenderness.  Musculoskeletal: Normal range of motion.  L foot in boot with bandages  Neurological: She is alert and oriented to person, place, and time.  Skin: Skin is warm, dry and intact. No rash noted. No erythema. No pallor.  Psychiatric: She has a normal mood and affect. Her speech is normal and behavior is normal. Judgment and thought content normal. Cognition and memory are normal.  Nursing note and vitals reviewed.   Results for orders placed or performed in visit on [id number]  Basic metabolic panel  Result Value Ref Range   Sodium 141 135 - 145 mmol/L   Potassium 3.4 (L) 3.5 - 5.1 mmol/L   Chloride 101 101 - 111 mmol/L   CO2 29 22 - 32 mmol/L   Glucose, Bld 143 (H) 65 - 99 mg/dL   BUN 12 6 - 20 mg/dL   Creatinine, Ser 0.93 0.44 - 1.00 mg/dL   Calcium 8.2 (L) 8.9 - 10.3 mg/dL   GFR calc non Af Amer 55 (L) >60 mL/min   GFR calc Af Amer >60 >60 mL/min   Anion gap 11 5 - 15      Assessment & Plan:   Problem List Items Addressed This Visit      Cardiovascular and Mediastinum   Type 2 DM with CKD stage 2 and hypertension (HCC)    A1c came back at 5.1 today. Continue current regimen. Continue to monitor. Recheck 3 months.       Relevant Orders   Bayer DCA Hb A1c Waived   Comprehensive metabolic panel   Lipid Panel w/o Chol/HDL  Ratio   Microalbumin, Urine Waived   UA/M w/rflx Culture, Routine   Heart & renal disease, hypertensive, with heart failure ([place]) - Primary    Continue to follow with cardiology. Monitoring her weights at home. Continue to monitor.      Relevant Orders   Comprehensive metabolic panel   Lipid Panel w/o Chol/HDL Ratio   Microalbumin, Urine Waived     Respiratory   COPD, severe (HCC)    Stable. Continue oxygen. Continue to monitor. Call with any concerns.         Endocrine   Diabetic polyneuropathy (HCC)    A1c came back at 5.1. Continue current regimen. Continue to monitor. Call with any concerns.       Relevant Orders   Bayer DCA Hb A1c Waived   Comprehensive metabolic panel   Lipid Panel w/o Chol/HDL Ratio   UA/M w/rflx Culture, Routine   Adult hypothyroidism    Rechecking levels today. Will adjust labs as needed.       Relevant Orders   Comprehensive metabolic panel   TSH     Nervous  and Auditory   Vascular dementia with behavioral disturbance    Stable. Continue current regimen. Continue to monitor. Good help at home.       Relevant Orders   Comprehensive metabolic panel     Musculoskeletal and Integument   Pressure injury of skin    Continue wound care with home health. If not improving, will get her into wound care.       Relevant Orders   Comprehensive metabolic panel     Genitourinary   Benign hypertensive renal disease    Under good control. Continue current regimen. Continue to monitor.       Relevant Orders   Comprehensive metabolic panel   Microalbumin, Urine Waived     Other   Iron deficiency anemia    Rechecking levels today. Await results and adjust as needed.       Relevant Orders   CBC with Differential/Platelet   Comprehensive metabolic panel   Iron and TIBC   Ferritin       Follow up plan: Return in about 3 months (around 11/29/2016) for DM visit.

## 2016-09-01 NOTE — Assessment & Plan Note (Addendum)
A1c came back at 5.1 today. Continue current regimen. Continue to monitor. Recheck 3 months.

## 2016-09-01 NOTE — Assessment & Plan Note (Addendum)
A1c came back at 5.1. Continue current regimen. Continue to monitor. Call with any concerns.

## 2016-09-01 NOTE — Assessment & Plan Note (Signed)
Stable. Continue current regimen. Continue to monitor. Good help at home.

## 2016-09-02 ENCOUNTER — Encounter: Payer: Self-pay | Admitting: Family Medicine

## 2016-09-02 LAB — CBC WITH DIFFERENTIAL/PLATELET
Basophils Absolute: 0.1 10*3/uL (ref 0.0–0.2)
Basos: 1 %
EOS (ABSOLUTE): 0.3 10*3/uL (ref 0.0–0.4)
EOS: 3 %
Hematocrit: 32.4 % — ABNORMAL LOW (ref 34.0–46.6)
Hemoglobin: 9.8 g/dL — ABNORMAL LOW (ref 11.1–15.9)
Immature Grans (Abs): 0.1 10*3/uL (ref 0.0–0.1)
Immature Granulocytes: 1 %
LYMPHS ABS: 2.3 10*3/uL (ref 0.7–3.1)
Lymphs: 26 %
MCH: 27.6 pg (ref 26.6–33.0)
MCHC: 30.2 g/dL — ABNORMAL LOW (ref 31.5–35.7)
MCV: 91 fL (ref 79–97)
MONOCYTES: 15 %
Monocytes Absolute: 1.3 10*3/uL — ABNORMAL HIGH (ref 0.1–0.9)
NEUTROS ABS: 4.9 10*3/uL (ref 1.4–7.0)
Neutrophils: 54 %
PLATELETS: 405 10*3/uL — AB (ref 150–379)
RBC: 3.55 x10E6/uL — ABNORMAL LOW (ref 3.77–5.28)
RDW: 15.6 % — AB (ref 12.3–15.4)
WBC: 9 10*3/uL (ref 3.4–10.8)

## 2016-09-02 LAB — COMPREHENSIVE METABOLIC PANEL
A/G RATIO: 1.3 (ref 1.2–2.2)
ALT: 11 IU/L (ref 0–32)
AST: 21 IU/L (ref 0–40)
Albumin: 3.9 g/dL (ref 3.5–4.7)
Alkaline Phosphatase: 80 IU/L (ref 39–117)
BUN/Creatinine Ratio: 18 (ref 12–28)
BUN: 14 mg/dL (ref 8–27)
Bilirubin Total: <0.2 mg/dL (ref 0.0–1.2)
CALCIUM: 8.6 mg/dL — AB (ref 8.7–10.3)
CO2: 23 mmol/L (ref 18–29)
CREATININE: 0.79 mg/dL (ref 0.57–1.00)
Chloride: 101 mmol/L (ref 96–106)
GFR calc Af Amer: 80 mL/min/{1.73_m2} (ref >59)
GFR, EST NON AFRICAN AMERICAN: 69 mL/min/{1.73_m2} (ref >59)
GLUCOSE: 127 mg/dL — AB (ref 65–99)
Globulin, Total: 2.9 g/dL (ref 1.5–4.5)
Potassium: 4.2 mmol/L (ref 3.5–5.2)
Sodium: 145 mmol/L — ABNORMAL HIGH (ref 134–144)
TOTAL PROTEIN: 6.8 g/dL (ref 6.0–8.5)

## 2016-09-02 LAB — LIPID PANEL W/O CHOL/HDL RATIO
Cholesterol, Total: 238 mg/dL — ABNORMAL HIGH (ref 100–199)
HDL: 30 mg/dL — AB (ref >39)
LDL Calculated: 145 mg/dL — ABNORMAL HIGH (ref 0–99)
TRIGLYCERIDES: 314 mg/dL — AB (ref 0–149)
VLDL CHOLESTEROL CAL: 63 mg/dL — AB (ref 5–40)

## 2016-09-02 LAB — IRON AND TIBC
IRON SATURATION: 52 % (ref 15–55)
Iron: 193 ug/dL — ABNORMAL HIGH (ref 27–139)
TIBC: 369 ug/dL (ref 250–450)
UIBC: 176 ug/dL (ref 118–369)

## 2016-09-02 LAB — FERRITIN: Ferritin: 55 ng/mL (ref 15–150)

## 2016-09-02 LAB — BAYER DCA HB A1C WAIVED: HB A1C: 5.1 % (ref <7.0)

## 2016-09-02 LAB — TSH: TSH: 0.995 u[IU]/mL (ref 0.450–4.500)

## 2016-09-11 ENCOUNTER — Ambulatory Visit: Payer: Medicare Other | Admitting: Family

## 2016-09-15 ENCOUNTER — Ambulatory Visit: Payer: Medicare Other | Admitting: Family

## 2016-09-26 ENCOUNTER — Other Ambulatory Visit: Payer: Self-pay | Admitting: Family Medicine

## 2016-09-28 MED ORDER — LEVOTHYROXINE SODIUM 175 MCG PO TABS: 175.0000 ug | ORAL_TABLET | Freq: Every day | ORAL | 1 refills | Status: DC

## 2016-09-28 NOTE — Telephone Encounter (Signed)
Routing to provider. We have Synthroid 141mcg on her chart.

## 2016-09-29 ENCOUNTER — Other Ambulatory Visit: Payer: Self-pay | Admitting: Family Medicine

## 2016-09-30 ENCOUNTER — Other Ambulatory Visit: Payer: Self-pay | Admitting: Family Medicine

## 2016-10-01 ENCOUNTER — Telehealth: Payer: Self-pay | Admitting: Family Medicine

## 2016-10-01 NOTE — Telephone Encounter (Signed)
Theresa Vasquez with [facility] needs a verbal order for continued care on patient.  She can be reached @ [phone]  Thanks

## 2016-10-01 NOTE — Telephone Encounter (Signed)
OK to give verbal order 

## 2016-10-01 NOTE — Telephone Encounter (Signed)
Amanda notified.  °

## 2016-10-01 NOTE — Telephone Encounter (Signed)
Routing to provider  

## 2016-10-02 ENCOUNTER — Ambulatory Visit: Payer: Medicare Other | Attending: Family | Admitting: Family

## 2016-10-02 ENCOUNTER — Encounter: Payer: Self-pay | Admitting: Family

## 2016-10-02 VITALS — BP 136/52 | HR 66 | Resp 18 | Ht 61.0 in | Wt 188.0 lb

## 2016-10-02 DIAGNOSIS — E1151 Type 2 diabetes mellitus with diabetic peripheral angiopathy without gangrene: Secondary | ICD-10-CM | POA: Diagnosis not present

## 2016-10-02 DIAGNOSIS — F0390 Unspecified dementia without behavioral disturbance: Secondary | ICD-10-CM | POA: Diagnosis not present

## 2016-10-02 DIAGNOSIS — E039 Hypothyroidism, unspecified: Secondary | ICD-10-CM | POA: Insufficient documentation

## 2016-10-02 DIAGNOSIS — L89629 Pressure ulcer of left heel, unspecified stage: Secondary | ICD-10-CM

## 2016-10-02 DIAGNOSIS — I480 Paroxysmal atrial fibrillation: Secondary | ICD-10-CM | POA: Insufficient documentation

## 2016-10-02 DIAGNOSIS — Z87891 Personal history of nicotine dependence: Secondary | ICD-10-CM | POA: Diagnosis not present

## 2016-10-02 DIAGNOSIS — E1142 Type 2 diabetes mellitus with diabetic polyneuropathy: Secondary | ICD-10-CM | POA: Diagnosis not present

## 2016-10-02 DIAGNOSIS — Z8711 Personal history of peptic ulcer disease: Secondary | ICD-10-CM | POA: Insufficient documentation

## 2016-10-02 DIAGNOSIS — F419 Anxiety disorder, unspecified: Secondary | ICD-10-CM | POA: Diagnosis not present

## 2016-10-02 DIAGNOSIS — M810 Age-related osteoporosis without current pathological fracture: Secondary | ICD-10-CM | POA: Diagnosis not present

## 2016-10-02 DIAGNOSIS — Z79899 Other long term (current) drug therapy: Secondary | ICD-10-CM | POA: Diagnosis not present

## 2016-10-02 DIAGNOSIS — F329 Major depressive disorder, single episode, unspecified: Secondary | ICD-10-CM | POA: Insufficient documentation

## 2016-10-02 DIAGNOSIS — M1990 Unspecified osteoarthritis, unspecified site: Secondary | ICD-10-CM | POA: Insufficient documentation

## 2016-10-02 DIAGNOSIS — Z7982 Long term (current) use of aspirin: Secondary | ICD-10-CM | POA: Insufficient documentation

## 2016-10-02 DIAGNOSIS — I130 Hypertensive heart and chronic kidney disease with heart failure and stage 1 through stage 4 chronic kidney disease, or unspecified chronic kidney disease: Secondary | ICD-10-CM | POA: Insufficient documentation

## 2016-10-02 DIAGNOSIS — I5032 Chronic diastolic (congestive) heart failure: Secondary | ICD-10-CM | POA: Diagnosis present

## 2016-10-02 DIAGNOSIS — E785 Hyperlipidemia, unspecified: Secondary | ICD-10-CM | POA: Insufficient documentation

## 2016-10-02 DIAGNOSIS — J449 Chronic obstructive pulmonary disease, unspecified: Secondary | ICD-10-CM | POA: Insufficient documentation

## 2016-10-02 DIAGNOSIS — Z9981 Dependence on supplemental oxygen: Secondary | ICD-10-CM | POA: Diagnosis not present

## 2016-10-02 DIAGNOSIS — Z7951 Long term (current) use of inhaled steroids: Secondary | ICD-10-CM | POA: Diagnosis not present

## 2016-10-02 DIAGNOSIS — Z8701 Personal history of pneumonia (recurrent): Secondary | ICD-10-CM | POA: Diagnosis not present

## 2016-10-02 DIAGNOSIS — Z833 Family history of diabetes mellitus: Secondary | ICD-10-CM | POA: Insufficient documentation

## 2016-10-02 DIAGNOSIS — I10 Essential (primary) hypertension: Secondary | ICD-10-CM

## 2016-10-02 DIAGNOSIS — N189 Chronic kidney disease, unspecified: Secondary | ICD-10-CM | POA: Insufficient documentation

## 2016-10-02 DIAGNOSIS — Z7984 Long term (current) use of oral hypoglycemic drugs: Secondary | ICD-10-CM | POA: Insufficient documentation

## 2016-10-02 DIAGNOSIS — I447 Left bundle-branch block, unspecified: Secondary | ICD-10-CM | POA: Diagnosis not present

## 2016-10-02 NOTE — Progress Notes (Signed)
Patient ID: Theresa Vasquez, female    DOB: 10-16-1930, 83 y.o.   MRN: [id number]  HPI  Theresa Vasquez is a 83 y/o female with a history of dementia, DM, PVD, paroxymal atrial fibrillation, anemia, hypothyroidism, HTN, hyperlipidemia, GI bleed, depression, COPD (oxygen dependent), anxiety, remote tobacco use and chronic heart failure.   Last echo was done 05/15/15 and showed an EF of 60-65% along with mild/mod aortic stenosis and mild AR/MR. EF has increased from 35% November 2015.  Was last admitted 07/19/16 due to acute exacerbation of her HF & COPD. Was given IV diuretics and steroids. Initially required bipap. Was discharged on antibiotics and prednisone. Previous admission was on 06/17/16 due to a left hip fracture that required surgery. Discharged to skilled nursing for rehab.  She presents today for her follow-up visit with fatigue with little exertion. Denies any shortness of breath or chest pain. Does have a mild amount of swelling in her ankles and has recently increased her furosemide to twice daily for a few days. Continues to have a wound on her left heel and currently has a bandage on it. Unable to stand but for short periods of time. Does weigh daily during the week when her aide is there but tends to not do it on the weekend.   Past Medical History:  Diagnosis Date  . Adenomatous polyps   . Age-related osteoporosis without current pathological fracture   . Anemia   . Anxiety   . Atherosclerosis of aorta ([place])   . Atrial fibrillation with RVR ([place])   . Barrett's esophagus without dysplasia   . CHF (congestive heart failure) ([place])   . Colitis   . COPD (chronic obstructive pulmonary disease) ([place])   . Dementia   . Depression   . Diabetes mellitus type II, uncontrolled ([place])   . Diverticulitis   . Diverticulosis of intestine without perforation or abscess without bleeding   . Edema   . Essential hemorrhagic thrombocythemia ([place])   . Gastrointestinal hemorrhage   . High  anion gap metabolic acidosis   . Hyperlipidemia   . Hypertension   . Hypertensive chronic kidney disease with stage 1 through stage 4 chronic kidney disease, or unspecified chronic kidney disease   . Hypothyroidism   . Hypoxemia   . Iron deficiency anemia   . Lactic acidosis   . Left bundle branch block   . Nonrheumatic mitral valve insufficiency   . Osteoarthritis   . Other psychoactive substance dependence, in remission ([place])   . Other specified symptoms and signs involving the circulatory and respiratory systems   . Paroxysmal atrial fibrillation (HCC)    s/p cardioversion  . Personal history of peptic ulcer disease   . Pulmonary edema   . PVD (peripheral vascular disease) ([place]) 05/14/2015  . Recurrent pneumonia    history of  . Respiratory failure with hypoxia and hypercapnia ([place])   . Type 2 diabetes mellitus with diabetic chronic kidney disease ([place])   . Type 2 diabetes mellitus with diabetic polyneuropathy ([place])   . Vascular dementia without behavioral disturbance   . Vascular disorder of intestine [facility])    Past Surgical History:  Procedure Laterality Date  . ABDOMINAL HYSTERECTOMY     partial  . APPENDECTOMY    . COLONOSCOPY    . ESOPHAGOGASTRODUODENOSCOPY    . ESOPHAGOGASTRODUODENOSCOPY (EGD) WITH PROPOFOL N/A 07/04/2015   Procedure: ESOPHAGOGASTRODUODENOSCOPY (EGD) WITH PROPOFOL;  Surgeon: Manya Silvas, MD;  Location: [facility] ENDOSCOPY;  Service: Endoscopy;  Laterality: N/A;  .  HIP SURGERY Left 06/2016   Rod placed   . INTRAMEDULLARY (IM) NAIL INTERTROCHANTERIC Left 06/18/2016   Procedure: INTRAMEDULLARY (IM) NAIL INTERTROCHANTRIC;  Surgeon: Dereck Leep, MD;  Location: [facility] ORS;  Service: Orthopedics;  Laterality: Left;  . SHOULDER ARTHROSCOPY    . SIGMOIDOSCOPY     Family History  Problem Relation Age of Onset  . Diabetes Mother   . Diabetes Father    Social History  Substance Use Topics  . Smoking status: Former Smoker    Packs/day: 1.00    Years:  60.00    Types: Cigarettes  . Smokeless tobacco: Never Used  . Alcohol use No   No Known Allergies  Prior to Admission medications   Medication Sig Start Date End Date Taking? Authorizing Provider  acetaminophen (TYLENOL) 500 MG tablet Take 1 tablet (500 mg total) by mouth every 4 (four) hours as needed for mild pain, fever or headache. 07/23/16  Yes Gladstone Lighter, MD  albuterol (PROVENTIL) (2.5 MG/3ML) 0.083% nebulizer solution Take 3 mLs (2.5 mg total) by nebulization every 4 (four) hours as needed for wheezing or shortness of breath. 11/26/15  Yes Megan P Johnson, DO  amiodarone (PACERONE) 100 MG tablet Take 100 mg by mouth daily.    Yes Historical Provider, MD  amLODipine (NORVASC) 5 MG tablet Take 1 tablet (5 mg total) by mouth 2 (two) times daily. 11/26/15  Yes Megan P Johnson, DO  aspirin EC 81 MG tablet Take 81 mg by mouth daily.    Yes Historical Provider, MD  Cholecalciferol (VITAMIN D-3) 1000 units CAPS Take 1 capsule (1,000 Units total) by mouth daily. 11/26/15  Yes Megan P Johnson, DO  divalproex (DEPAKOTE) 250 MG DR tablet Take 1 tablet (250 mg total) by mouth 3 (three) times daily. 11/26/15  Yes Megan P Johnson, DO  donepezil (ARICEPT) 10 MG tablet Take 1 tablet (10 mg total) by mouth at bedtime. 11/26/15  Yes Megan P Johnson, DO  ferrous sulfate 325 (65 FE) MG tablet Take 1 tablet (325 mg total) by mouth 2 (two) times daily with a meal. Patient taking differently: Take 325 mg by mouth daily with breakfast.  11/26/15  Yes Megan P Johnson, DO  Fluticasone-Salmeterol (ADVAIR) 250-50 MCG/DOSE AEPB Inhale 1 puff into the lungs 2 (two) times daily. 11/26/15  Yes Megan P Johnson, DO  furosemide (LASIX) 40 MG tablet Take 1 tablet (40 mg total) by mouth 2 (two) times daily. 11/26/15  Yes Megan P Johnson, DO  gabapentin (NEURONTIN) 400 MG capsule TAKE 1 CAPSULE TWICE DAILY 07/14/16  Yes Volney American, PA-C  lisinopril (PRINIVIL,ZESTRIL) 20 MG tablet Take 20 mg by mouth daily.    Yes Historical  Provider, MD  Melatonin 3 MG TABS Take 1 tablet (3 mg total) by mouth at bedtime. 05/28/15  Yes Megan P Johnson, DO  metFORMIN (GLUMETZA) 500 MG (MOD) 24 hr tablet Take 1 tablet (500 mg total) by mouth daily with breakfast. 11/26/15  Yes Megan P Johnson, DO  metoprolol (LOPRESSOR) 50 MG tablet Take 0.5 tablets (25 mg total) by mouth 2 (two) times daily. 11/26/15  Yes Megan P Johnson, DO  MICROLET LANCETS MISC TEST BLOOD GLUCOSE LEVEL ONCE DAILY 04/10/16  Yes Megan P Johnson, DO  pantoprazole (PROTONIX) 40 MG tablet Take 40 mg by mouth daily.   Yes Historical Provider, MD  QUEtiapine (SEROQUEL) 25 MG tablet Take 1 tablet (25 mg total) by mouth at bedtime. 11/26/15  Yes Megan P Johnson, DO  tiotropium ([place]) 18 MCG  inhalation capsule Place 1 capsule (18 mcg total) into inhaler and inhale daily. 11/26/15  Yes Megan P Johnson, DO  venlafaxine XR (EFFEXOR-XR) 37.5 MG 24 hr capsule TAKE 1 CAPSULE BY MOUTH EVERY DAY WITH BREAKFAST 09/30/16  Yes Megan P Johnson, DO  levothyroxine (SYNTHROID, LEVOTHROID) 150 MCG tablet TAKE ONE (1) TABLET BY MOUTH EVERY DAY BEFORE BREAKFAST 09/29/16   Volney American, PA-C         senna-docusate (SENOKOT-S) 8.6-50 MG tablet Take 1 tablet by mouth at bedtime as needed for mild constipation. Patient not taking: Reported on 10/02/2016 06/23/16   Demetrios Loll, MD     Review of Systems  Constitutional: Positive for fatigue. Negative for appetite change.  HENT: Negative for congestion, postnasal drip and sore throat.   Eyes: Negative.   Respiratory: Negative for chest tightness and shortness of breath.   Cardiovascular: Positive for leg swelling. Negative for chest pain and palpitations.  Gastrointestinal: Negative for abdominal distention and abdominal pain.  Endocrine: Negative.   Genitourinary: Negative.   Musculoskeletal: Positive for arthralgias (left ankle). Negative for back pain.  Skin: Negative.   Allergic/Immunologic: Negative.   Neurological: Positive for dizziness  (at times). Negative for light-headedness.  Hematological: Negative for adenopathy. Bruises/bleeds easily.  Psychiatric/Behavioral: Negative for dysphoric mood, sleep disturbance and suicidal ideas. The patient is not nervous/anxious.    Vitals:   10/02/16 1339  BP: (!) 136/52  Pulse: 66  Resp: 18  SpO2: 95%  Weight: 188 lb (85.3 kg)  Height: 5\' 1"  (1.549 m)   Wt Readings from Last 3 Encounters:  10/02/16 188 lb (85.3 kg)  09/01/16 187 lb (84.8 kg)  08/10/16 194 lb (88 kg)   Lab Results  Component Value Date   CREATININE 0.79 09/01/2016   CREATININE 0.93 08/10/2016   CREATININE 0.80 07/22/2016    Physical Exam  Constitutional: She is oriented to person, place, and time. She appears well-developed and well-nourished.  HENT:  Head: Normocephalic and atraumatic.  Eyes: Conjunctivae are normal. Pupils are equal, round, and reactive to light.  Neck: Normal range of motion. Neck supple. No JVD present.  Cardiovascular: Normal rate and regular rhythm.   Pulmonary/Chest: Effort normal. She has no wheezes. She has no rales.  Abdominal: Soft. She exhibits no distension. There is no tenderness.  Musculoskeletal: She exhibits edema (trace edema around bilateral ankles). She exhibits no tenderness.  Neurological: She is alert and oriented to person, place, and time.  Skin: Skin is warm and dry.  Psychiatric: She has a normal mood and affect. Her behavior is normal.  Nursing note and vitals reviewed.     Assessment & Plan:  1: Chronic heart failure with preserved ejection fraction- - NYHA class III - euvolemic - weighing daily during the week when her aide is there but tends to skip the weekends. Instructed to weigh every day and call for an overnight weight gain of >2 pounds or a weekly weight gain of >5 pounds - not adding salt. Discussed the importance of closely following a 2000mg  sodium diet and written information was given to them. Son does the shopping and is already reading  food labels for sodium content.  - has home health coming out to change the dressing on her heel - last saw cardiologist Ubaldo Glassing) on 06/02/16 and is due to return May 2018 -- wears oxygen at 2L usually around the clock although doesn't have it on today  2: HTN- - BP looks good today - last saw PCP [facility])  09/01/16  3: Paroxysmal atrial fibrillation- - currently rate controlled at this time - on amiodarone, amlodipine and metoprolol  - not on anticoagulation due to previous GI bleed  4: Decubitus ulcer of left heel- - currently has the wound bandaged  2 different doses of levothyroxine are in the computer system and son says that she's currently taking the 163mcg dose. Explained that it looks like her PCP sent in the 124mcg dose a few days ago. He says that he'll go pick it up today and if he has questions about it, that he'll call her PCP.  Medication list was reviewed.  Return here in 3 months or sooner for any questions/problems before then.

## 2016-10-02 NOTE — Patient Instructions (Signed)
Continue weighing daily and call for an overnight weight gain of > 2 pounds or a weekly weight gain of >5 pounds. 

## 2016-10-06 ENCOUNTER — Other Ambulatory Visit: Payer: Self-pay | Admitting: Family Medicine

## 2016-10-14 ENCOUNTER — Telehealth: Payer: Self-pay | Admitting: Family Medicine

## 2016-10-14 NOTE — Telephone Encounter (Signed)
It's more important to know which she HAS been taking. If we can find out what she has been taking- we'll get a repeat of her blood test to see if that's what she should be on or if we need to change the dose.

## 2016-10-14 NOTE — Telephone Encounter (Signed)
Theresa Vasquez called in and would like to know if the pt should be taking levothyroxine (SYNTHROID, LEVOTHROID) 150 MCG tablet or levothyroxine (SYNTHROID, LEVOTHROID) 175 MCG tablet.

## 2016-10-14 NOTE — Telephone Encounter (Signed)
Routing to provider. I tried looking but not clear from OV note, labs, or letter to which dosage patient is supposed to be taking.

## 2016-10-15 NOTE — Telephone Encounter (Signed)
Called and spoke to patient's son Nicole Kindred. He asked the caregiver while on the phone and she stated that the patient has been taking the 150 mcg levothyroxine for the last 2 or 3 months. They state that they did not know the dosage was changed and he just wants to know which on to pick up at the pharmacy. Last TSH was drawn 09/01/16, within the 2 to 3 month period.

## 2016-10-15 NOTE — Telephone Encounter (Signed)
Please have her stay on the 168mcg but we need her to come in (or have home health draw) another TSH to make sure she's on the right dose. Then we will cancel, whatever she's not supposed to be on at the pharmacy

## 2016-10-15 NOTE — Telephone Encounter (Signed)
Called and left patient's son a VM letting him know what Dr. Durenda Age instructions were. I asked for him to please give me a call back to let us know if they will be coming by here for lab or if home health can check the TSH.

## 2016-11-12 ENCOUNTER — Other Ambulatory Visit: Payer: Self-pay | Admitting: Family Medicine

## 2016-11-12 NOTE — Telephone Encounter (Signed)
Routing to provider  

## 2016-11-16 ENCOUNTER — Other Ambulatory Visit: Payer: Self-pay | Admitting: Family Medicine

## 2016-11-16 ENCOUNTER — Telehealth: Payer: Self-pay | Admitting: Family Medicine

## 2016-11-16 NOTE — Telephone Encounter (Signed)
Routing to provider  

## 2016-11-16 NOTE — Telephone Encounter (Signed)
Please find out what dose of levothyroxine Theresa Vasquez has been taking. She also needs to come in for repeat TSH to make sure she's where she needs to be. Thanks!

## 2016-11-16 NOTE — Telephone Encounter (Signed)
Called patients son, no answer, left voicemail asking him to return my call.

## 2016-11-17 MED ORDER — LEVOTHYROXINE SODIUM 150 MCG PO TABS: ORAL_TABLET | ORAL | 0 refills | Status: DC

## 2016-11-17 NOTE — Telephone Encounter (Signed)
Theresa Vasquez stated that the pt was taking 150 and would like to know if she could have enough to last until appt 12/01/16.

## 2016-11-17 NOTE — Telephone Encounter (Signed)
Called and left a message for patient's son to give me a call back.

## 2016-12-01 ENCOUNTER — Encounter: Payer: Self-pay | Admitting: Family Medicine

## 2016-12-01 ENCOUNTER — Ambulatory Visit (INDEPENDENT_AMBULATORY_CARE_PROVIDER_SITE_OTHER): Payer: Medicare Other | Admitting: Family Medicine

## 2016-12-01 VITALS — BP 136/67 | HR 74 | Temp 98.7°F | Wt 190.8 lb

## 2016-12-01 DIAGNOSIS — E039 Hypothyroidism, unspecified: Secondary | ICD-10-CM

## 2016-12-01 DIAGNOSIS — I129 Hypertensive chronic kidney disease with stage 1 through stage 4 chronic kidney disease, or unspecified chronic kidney disease: Secondary | ICD-10-CM

## 2016-12-01 DIAGNOSIS — I130 Hypertensive heart and chronic kidney disease with heart failure and stage 1 through stage 4 chronic kidney disease, or unspecified chronic kidney disease: Secondary | ICD-10-CM

## 2016-12-01 DIAGNOSIS — E1122 Type 2 diabetes mellitus with diabetic chronic kidney disease: Secondary | ICD-10-CM | POA: Diagnosis not present

## 2016-12-01 DIAGNOSIS — I700 Atherosclerosis of aorta: Secondary | ICD-10-CM

## 2016-12-01 DIAGNOSIS — D500 Iron deficiency anemia secondary to blood loss (chronic): Secondary | ICD-10-CM

## 2016-12-01 DIAGNOSIS — D473 Essential (hemorrhagic) thrombocythemia: Secondary | ICD-10-CM | POA: Diagnosis not present

## 2016-12-01 DIAGNOSIS — N182 Chronic kidney disease, stage 2 (mild): Secondary | ICD-10-CM

## 2016-12-01 LAB — MICROALBUMIN, URINE WAIVED

## 2016-12-01 MED ORDER — LISINOPRIL 20 MG PO TABS: ORAL_TABLET | ORAL | 1 refills | Status: AC

## 2016-12-01 MED ORDER — GABAPENTIN 400 MG PO CAPS: 400.0000 mg | ORAL_CAPSULE | Freq: Two times a day (BID) | ORAL | 1 refills | Status: AC

## 2016-12-01 NOTE — Assessment & Plan Note (Signed)
Chronic and stable. Rechecking CBC today. Await results. Continue to monitor.

## 2016-12-01 NOTE — Assessment & Plan Note (Signed)
Under good control. Continue current regimen. Continue to monitor. Call with any concerns. 

## 2016-12-01 NOTE — Assessment & Plan Note (Signed)
Stable. Checking labs today. Await results. Call with any concerns.

## 2016-12-01 NOTE — Progress Notes (Signed)
BP 136/67 (BP Location: Left Arm, Patient Position: Sitting, Cuff Size: Normal)   Pulse 74   Temp 98.7 F (37.1 C)   Wt 190 lb 12.8 oz (86.5 kg)   SpO2 96%   BMI 36.05 kg/m    Subjective:    Patient ID: Theresa Vasquez, female    DOB: December 25, 1930, 82 y.o.   MRN: [id number]  HPI: Theresa Vasquez is a 83 y.o. female  Chief Complaint  Patient presents with  . Diabetes   DIABETES Hypoglycemic episodes:no  Polydipsia/polyuria: no Visual disturbance: no Chest pain: no Paresthesias: no Glucose Monitoring: no Taking Insulin?: no Blood Pressure Monitoring: not checking Retinal Examination: Not up to Date Foot Exam: Up to Date Diabetic Education: Completed Pneumovax: Up to Date Influenza: Up to Date Aspirin: yes  ANEMIA Anemia status: better Etiology of anemia: iron def Duration of anemia treatment: chronic  Compliance with treatment: excellent compliance Iron supplementation side effects: no Severity of anemia: mild Fatigue: yes Decreased exercise tolerance: yes  Dyspnea on exertion: no Palpitations: no Bleeding: no Pica: no  HYPERTENSION / HYPERLIPIDEMIA Satisfied with current treatment? yes Duration of hypertension: chronic BP monitoring frequency: not checking BP medication side effects: no Duration of hyperlipidemia: chronic Cholesterol medication side effects: no Cholesterol supplements: none Medication compliance: excellent compliance Aspirin: yes Recent stressors: no Recurrent headaches: no Visual changes: no Palpitations: no Dyspnea: no Chest pain: no Lower extremity edema: no Dizzy/lightheaded: no  HYPOTHYROIDISM- has been on the 145mcg of synthroid at home Thyroid control status:stable Satisfied with current treatment? yes Medication side effects: no Medication compliance: excellent compliance Recent dose adjustment:no Fatigue: no Cold intolerance: no Heat intolerance: no Weight gain: no Weight loss: no Constipation:  no Diarrhea/loose stools: no Palpitations: no Lower extremity edema: no Anxiety/depressed mood: no   Relevant past medical, surgical, family and social history reviewed and updated as indicated. Interim medical history since our last visit reviewed. Allergies and medications reviewed and updated.  Review of Systems  Constitutional: Negative.   Respiratory: Negative.   Cardiovascular: Negative.   Psychiatric/Behavioral: Negative.     Per HPI unless specifically indicated above     Objective:    BP 136/67 (BP Location: Left Arm, Patient Position: Sitting, Cuff Size: Normal)   Pulse 74   Temp 98.7 F (37.1 C)   Wt 190 lb 12.8 oz (86.5 kg)   SpO2 96%   BMI 36.05 kg/m   Wt Readings from Last 3 Encounters:  12/01/16 190 lb 12.8 oz (86.5 kg)  10/02/16 188 lb (85.3 kg)  09/01/16 187 lb (84.8 kg)    Physical Exam  Constitutional: She is oriented to person, place, and time. She appears well-developed and well-nourished. No distress.  HENT:  Head: Normocephalic and atraumatic.  Right Ear: Hearing normal.  Left Ear: Hearing normal.  Nose: Nose normal.  Eyes: Conjunctivae and lids are normal. Right eye exhibits no discharge. Left eye exhibits no discharge. No scleral icterus.  Cardiovascular: Normal rate, regular rhythm, normal heart sounds and intact distal pulses.  Exam reveals no gallop and no friction rub.   No murmur heard. Pulmonary/Chest: Effort normal and breath sounds normal. No respiratory distress. She has no wheezes. She has no rales. She exhibits no tenderness.  Musculoskeletal: Normal range of motion.  Neurological: She is alert and oriented to person, place, and time.  Skin: Skin is warm, dry and intact. No rash noted. No erythema. No pallor.  Psychiatric: She has a normal mood and affect. Her speech is normal and  behavior is normal. Judgment and thought content normal. Cognition and memory are normal.  Nursing note and vitals reviewed.   Results for orders  placed or performed in visit on 09/01/16  Bayer DCA Hb A1c Waived  Result Value Ref Range   Bayer DCA Hb A1c Waived 5.1 <7.0 %  CBC with Differential/Platelet  Result Value Ref Range   WBC 9.0 3.4 - 10.8 x10E3/uL   RBC 3.55 (L) 3.77 - 5.28 x10E6/uL   Hemoglobin 9.8 (L) 11.1 - 15.9 g/dL   Hematocrit 32.4 (L) 34.0 - 46.6 %   MCV 91 79 - 97 fL   MCH 27.6 26.6 - 33.0 pg   MCHC 30.2 (L) 31.5 - 35.7 g/dL   RDW 15.6 (H) 12.3 - 15.4 %   Platelets 405 (H) 150 - 379 x10E3/uL   Neutrophils 54 Not Estab. %   Lymphs 26 Not Estab. %   Monocytes 15 Not Estab. %   Eos 3 Not Estab. %   Basos 1 Not Estab. %   Neutrophils Absolute 4.9 1.4 - 7.0 x10E3/uL   Lymphocytes Absolute 2.3 0.7 - 3.1 x10E3/uL   Monocytes Absolute 1.3 (H) 0.1 - 0.9 x10E3/uL   EOS (ABSOLUTE) 0.3 0.0 - 0.4 x10E3/uL   Basophils Absolute 0.1 0.0 - 0.2 x10E3/uL   Immature Granulocytes 1 Not Estab. %   Immature Grans (Abs) 0.1 0.0 - 0.1 x10E3/uL  Comprehensive metabolic panel  Result Value Ref Range   Glucose 127 (H) 65 - 99 mg/dL   BUN 14 8 - 27 mg/dL   Creatinine, Ser 0.79 0.57 - 1.00 mg/dL   GFR calc non Af Amer 69 >59 mL/min/1.73   GFR calc Af Amer 80 >59 mL/min/1.73   BUN/Creatinine Ratio 18 12 - 28   Sodium 145 (H) 134 - 144 mmol/L   Potassium 4.2 3.5 - 5.2 mmol/L   Chloride 101 96 - 106 mmol/L   CO2 23 18 - 29 mmol/L   Calcium 8.6 (L) 8.7 - 10.3 mg/dL   Total Protein 6.8 6.0 - 8.5 g/dL   Albumin 3.9 3.5 - 4.7 g/dL   Globulin, Total 2.9 1.5 - 4.5 g/dL   Albumin/Globulin Ratio 1.3 1.2 - 2.2   Bilirubin Total <0.2 0.0 - 1.2 mg/dL   Alkaline Phosphatase 80 39 - 117 IU/L   AST 21 0 - 40 IU/L   ALT 11 0 - 32 IU/L  Lipid Panel w/o Chol/HDL Ratio  Result Value Ref Range   Cholesterol, Total 238 (H) 100 - 199 mg/dL   Triglycerides 314 (H) 0 - 149 mg/dL   HDL 30 (L) >39 mg/dL   VLDL Cholesterol Cal 63 (H) 5 - 40 mg/dL   LDL Calculated 145 (H) 0 - 99 mg/dL  TSH  Result Value Ref Range   TSH 0.995 0.450 - 4.500 uIU/mL   Iron and TIBC  Result Value Ref Range   Total Iron Binding Capacity 369 250 - 450 ug/dL   UIBC 176 118 - 369 ug/dL   Iron 193 (H) 27 - 139 ug/dL   Iron Saturation 52 15 - 55 %  Ferritin  Result Value Ref Range   Ferritin 55 15 - 150 ng/mL      Assessment & Plan:   Problem List Items Addressed This Visit      Cardiovascular and Mediastinum   Type 2 DM with CKD stage 2 and hypertension (HCC)    A1c down to 5.0- will stop metformin and recheck in 3 months.  Relevant Medications   lisinopril (PRINIVIL,ZESTRIL) 20 MG tablet   Other Relevant Orders   Bayer DCA Hb A1c Waived   Comprehensive metabolic panel   Microalbumin, Urine Waived   Atherosclerosis of aorta (HCC)    Rechecking cholesterol today. Continue BP control. Continue to monitor.       Relevant Medications   lisinopril (PRINIVIL,ZESTRIL) 20 MG tablet   Other Relevant Orders   Comprehensive metabolic panel   Lipid Panel w/o Chol/HDL Ratio   Heart & renal disease, hypertensive, with heart failure ([place]) - Primary    Under good control. Continue to monitor. Call with any concerns. Continue current regimen.       Relevant Medications   lisinopril (PRINIVIL,ZESTRIL) 20 MG tablet   Other Relevant Orders   Comprehensive metabolic panel   Microalbumin, Urine Waived     Endocrine   Adult hypothyroidism    Stable. Checking labs today. Await results. Call with any concerns.       Relevant Orders   TSH   Comprehensive metabolic panel     Genitourinary   Benign hypertensive renal disease    Under good control. Continue current regimen. Continue to monitor. Call with any concerns.         Hematopoietic and Hemostatic   Essential thrombocythemia ([place])    Chronic and stable. Rechecking CBC today. Await results. Continue to monitor.       Relevant Orders   CBC with Differential/Platelet   Comprehensive metabolic panel     Other   Iron deficiency anemia    Chronic and stable. Rechecking CBC today. Await  results. Continue to monitor.       Relevant Orders   CBC with Differential/Platelet   Comprehensive metabolic panel       Follow up plan: Return in about 3 months (around 03/03/2017) for Follow up with A1c.

## 2016-12-01 NOTE — Assessment & Plan Note (Signed)
Under good control.  Continue to monitor. Call with any concerns. Continue current regimen.  

## 2016-12-01 NOTE — Assessment & Plan Note (Signed)
Rechecking cholesterol today. Continue BP control. Continue to monitor.

## 2016-12-01 NOTE — Assessment & Plan Note (Signed)
A1c down to 5.0- will stop metformin and recheck in 3 months.

## 2016-12-02 ENCOUNTER — Telehealth: Payer: Self-pay | Admitting: Family Medicine

## 2016-12-02 LAB — LIPID PANEL W/O CHOL/HDL RATIO
Cholesterol, Total: 213 mg/dL — ABNORMAL HIGH (ref 100–199)
HDL: 37 mg/dL — ABNORMAL LOW
LDL Calculated: 131 mg/dL — ABNORMAL HIGH (ref 0–99)
Triglycerides: 224 mg/dL — ABNORMAL HIGH (ref 0–149)
VLDL Cholesterol Cal: 45 mg/dL — ABNORMAL HIGH (ref 5–40)

## 2016-12-02 LAB — COMPREHENSIVE METABOLIC PANEL
A/G RATIO: 1.3 (ref 1.2–2.2)
ALBUMIN: 3.7 g/dL (ref 3.5–4.7)
ALT: 18 IU/L (ref 0–32)
AST: 27 IU/L (ref 0–40)
Alkaline Phosphatase: 60 IU/L (ref 39–117)
BUN / CREAT RATIO: 17 (ref 12–28)
BUN: 14 mg/dL (ref 8–27)
Bilirubin Total: <0.2 mg/dL (ref 0.0–1.2)
CHLORIDE: 99 mmol/L (ref 96–106)
CO2: 25 mmol/L (ref 18–29)
Calcium: 8.6 mg/dL — ABNORMAL LOW (ref 8.7–10.3)
Creatinine, Ser: 0.83 mg/dL (ref 0.57–1.00)
GFR calc Af Amer: 75 mL/min/{1.73_m2} (ref >59)
GFR calc non Af Amer: 65 mL/min/{1.73_m2} (ref >59)
Globulin, Total: 2.9 g/dL (ref 1.5–4.5)
Glucose: 75 mg/dL (ref 65–99)
POTASSIUM: 4.4 mmol/L (ref 3.5–5.2)
SODIUM: 145 mmol/L — AB (ref 134–144)
TOTAL PROTEIN: 6.6 g/dL (ref 6.0–8.5)

## 2016-12-02 LAB — CBC WITH DIFFERENTIAL/PLATELET
BASOS: 0 %
Basophils Absolute: 0 10*3/uL (ref 0.0–0.2)
EOS (ABSOLUTE): 0.2 10*3/uL (ref 0.0–0.4)
Eos: 3 %
HEMOGLOBIN: 9.2 g/dL — AB (ref 11.1–15.9)
Hematocrit: 31.7 % — ABNORMAL LOW (ref 34.0–46.6)
IMMATURE GRANS (ABS): 0 10*3/uL (ref 0.0–0.1)
Immature Granulocytes: 0 %
LYMPHS ABS: 1.5 10*3/uL (ref 0.7–3.1)
LYMPHS: 17 %
MCH: 27.6 pg (ref 26.6–33.0)
MCHC: 29 g/dL — ABNORMAL LOW (ref 31.5–35.7)
MCV: 95 fL (ref 79–97)
MONOCYTES: 15 %
Monocytes Absolute: 1.3 10*3/uL — ABNORMAL HIGH (ref 0.1–0.9)
NEUTROS ABS: 5.7 10*3/uL (ref 1.4–7.0)
Neutrophils: 65 %
Platelets: 477 10*3/uL — ABNORMAL HIGH (ref 150–379)
RBC: 3.33 x10E6/uL — ABNORMAL LOW (ref 3.77–5.28)
RDW: 16.5 % — ABNORMAL HIGH (ref 12.3–15.4)
WBC: 8.8 10*3/uL (ref 3.4–10.8)

## 2016-12-02 LAB — TSH: TSH: 1.77 u[IU]/mL (ref 0.450–4.500)

## 2016-12-02 MED ORDER — LEVOTHYROXINE SODIUM 150 MCG PO TABS: ORAL_TABLET | ORAL | 3 refills | Status: AC

## 2016-12-02 NOTE — Telephone Encounter (Signed)
Called and left Nicole Kindred a VM asking for him to please return my call.

## 2016-12-02 NOTE — Telephone Encounter (Signed)
Tried calling patient again. No answer and I did not leave a VM this time.

## 2016-12-02 NOTE — Telephone Encounter (Signed)
Please let Nicole Kindred or Letta Median know that Tenzin's labs came back normal. I've sent over her medicine to the pharmacy for her. Her iron is dropping again though, so I want her to take the iron 2x a day. Thanks!

## 2016-12-03 ENCOUNTER — Telehealth: Payer: Self-pay | Admitting: Family Medicine

## 2016-12-03 LAB — BAYER DCA HB A1C WAIVED: HB A1C (BAYER DCA - WAIVED): 5 % (ref <7.0)

## 2016-12-03 NOTE — Telephone Encounter (Signed)
Noted  

## 2016-12-03 NOTE — Telephone Encounter (Signed)
Called [facility].  Patient needed continuation on wound care for a pressure ulcer.  Spoke with Dr. Wynetta Emery. Dr. Wynetta Emery ok'd continuation.  Verbal order given to [place]. Routing to provider for FYI.

## 2016-12-03 NOTE — Telephone Encounter (Signed)
Estill Bamberg from [facility] care called in regards to patient's recertification orders.  Please Advise.   Estill Bamberg from [place]: [phone]  Thank you.

## 2016-12-03 NOTE — Telephone Encounter (Signed)
Called earlier and spoke with Estill Bamberg patient's nurse from [facility]. Spoke with Dr. Wynetta Emery and she said to relay this information to [place].  Estill Bamberg said she would try Nicole Kindred and if she couldn't get her try [facility] after hours. Letta Median is working a different job.  Routing to provider as Juluis Rainier.

## 2016-12-08 ENCOUNTER — Other Ambulatory Visit: Payer: Self-pay | Admitting: Family Medicine

## 2016-12-11 ENCOUNTER — Other Ambulatory Visit: Payer: Self-pay | Admitting: Family Medicine

## 2016-12-25 ENCOUNTER — Ambulatory Visit: Payer: Medicare Other | Attending: Family | Admitting: Family

## 2016-12-25 ENCOUNTER — Encounter: Payer: Self-pay | Admitting: Family

## 2016-12-25 VITALS — BP 123/54 | HR 64 | Resp 20 | Ht 61.0 in | Wt 192.4 lb

## 2016-12-25 DIAGNOSIS — J441 Chronic obstructive pulmonary disease with (acute) exacerbation: Secondary | ICD-10-CM | POA: Insufficient documentation

## 2016-12-25 DIAGNOSIS — Z87891 Personal history of nicotine dependence: Secondary | ICD-10-CM | POA: Diagnosis not present

## 2016-12-25 DIAGNOSIS — Z79899 Other long term (current) drug therapy: Secondary | ICD-10-CM | POA: Insufficient documentation

## 2016-12-25 DIAGNOSIS — I10 Essential (primary) hypertension: Secondary | ICD-10-CM

## 2016-12-25 DIAGNOSIS — L89629 Pressure ulcer of left heel, unspecified stage: Secondary | ICD-10-CM | POA: Insufficient documentation

## 2016-12-25 DIAGNOSIS — N184 Chronic kidney disease, stage 4 (severe): Secondary | ICD-10-CM | POA: Diagnosis not present

## 2016-12-25 DIAGNOSIS — E1165 Type 2 diabetes mellitus with hyperglycemia: Secondary | ICD-10-CM | POA: Diagnosis not present

## 2016-12-25 DIAGNOSIS — E1122 Type 2 diabetes mellitus with diabetic chronic kidney disease: Secondary | ICD-10-CM | POA: Insufficient documentation

## 2016-12-25 DIAGNOSIS — E1151 Type 2 diabetes mellitus with diabetic peripheral angiopathy without gangrene: Secondary | ICD-10-CM | POA: Diagnosis not present

## 2016-12-25 DIAGNOSIS — F0390 Unspecified dementia without behavioral disturbance: Secondary | ICD-10-CM | POA: Diagnosis not present

## 2016-12-25 DIAGNOSIS — Z9981 Dependence on supplemental oxygen: Secondary | ICD-10-CM | POA: Insufficient documentation

## 2016-12-25 DIAGNOSIS — E785 Hyperlipidemia, unspecified: Secondary | ICD-10-CM | POA: Insufficient documentation

## 2016-12-25 DIAGNOSIS — I130 Hypertensive heart and chronic kidney disease with heart failure and stage 1 through stage 4 chronic kidney disease, or unspecified chronic kidney disease: Secondary | ICD-10-CM | POA: Diagnosis not present

## 2016-12-25 DIAGNOSIS — I480 Paroxysmal atrial fibrillation: Secondary | ICD-10-CM | POA: Diagnosis not present

## 2016-12-25 DIAGNOSIS — E039 Hypothyroidism, unspecified: Secondary | ICD-10-CM | POA: Insufficient documentation

## 2016-12-25 DIAGNOSIS — Z7982 Long term (current) use of aspirin: Secondary | ICD-10-CM | POA: Insufficient documentation

## 2016-12-25 DIAGNOSIS — F329 Major depressive disorder, single episode, unspecified: Secondary | ICD-10-CM | POA: Diagnosis not present

## 2016-12-25 DIAGNOSIS — I5032 Chronic diastolic (congestive) heart failure: Secondary | ICD-10-CM | POA: Insufficient documentation

## 2016-12-25 DIAGNOSIS — F419 Anxiety disorder, unspecified: Secondary | ICD-10-CM | POA: Diagnosis not present

## 2016-12-25 NOTE — Patient Instructions (Signed)
Continue weighing daily and call for an overnight weight gain of > 2 pounds or a weekly weight gain of >5 pounds. 

## 2016-12-25 NOTE — Progress Notes (Signed)
Patient ID: Theresa Vasquez, female    DOB: 1931-05-14, 83 y.o.   MRN: [id number]  HPI  Theresa Vasquez is a 83 y/o female with a history of dementia, DM, PVD, paroxymal atrial fibrillation, anemia, hypothyroidism, HTN, hyperlipidemia, GI bleed, depression, COPD (oxygen dependent), anxiety, remote tobacco use and chronic heart failure.   Last echo was done 05/15/15 and showed an EF of 60-65% along with mild/mod aortic stenosis and mild AR/MR. EF has increased from 35% November 2015.  Was last admitted 07/19/16 due to acute exacerbation of Theresa Vasquez HF & COPD. Was given IV diuretics and steroids. Initially required bipap. Was discharged on antibiotics and prednisone. Previous admission was on 06/17/16 due to a left hip fracture that required surgery. Discharged to skilled nursing for rehab.  Theresa Vasquez presents today for Theresa Vasquez follow-up visit with a chief complaint of mild shortness of breath with moderate exertion. Theresa Vasquez gets short of breath when walking short distances. Theresa Vasquez describes this as chronic in nature and has been present in varying degrees for many years. Theresa Vasquez has associated fatigue, edema and dizziness along with this.   Past Medical History:  Diagnosis Date  . Adenomatous polyps   . Age-related osteoporosis without current pathological fracture   . Anemia   . Anxiety   . Atherosclerosis of aorta ([place])   . Atrial fibrillation with RVR ([place])   . Barrett's esophagus without dysplasia   . CHF (congestive heart failure) ([place])   . Colitis   . COPD (chronic obstructive pulmonary disease) ([place])   . Dementia   . Depression   . Diabetes mellitus type II, uncontrolled ([place])   . Diverticulitis   . Diverticulosis of intestine without perforation or abscess without bleeding   . Edema   . Essential hemorrhagic thrombocythemia ([place])   . Gastrointestinal hemorrhage   . High anion gap metabolic acidosis   . Hyperlipidemia   . Hypertension   . Hypertensive chronic kidney disease with stage 1 through stage 4  chronic kidney disease, or unspecified chronic kidney disease   . Hypothyroidism   . Hypoxemia   . Iron deficiency anemia   . Lactic acidosis   . Left bundle branch block   . Nonrheumatic mitral valve insufficiency   . Osteoarthritis   . Other psychoactive substance dependence, in remission ([place])   . Other specified symptoms and signs involving the circulatory and respiratory systems   . Paroxysmal atrial fibrillation ([facility])    s/p cardioversion  . Personal history of peptic ulcer disease   . Pulmonary edema   . PVD (peripheral vascular disease) ([place]) 05/14/2015  . Recurrent pneumonia    history of  . Respiratory failure with hypoxia and hypercapnia ([place])   . Type 2 diabetes mellitus with diabetic chronic kidney disease ([place])   . Type 2 diabetes mellitus with diabetic polyneuropathy ([place])   . Vascular dementia without behavioral disturbance   . Vascular disorder of intestine [facility])    Past Surgical History:  Procedure Laterality Date  . ABDOMINAL HYSTERECTOMY     partial  . APPENDECTOMY    . COLONOSCOPY    . ESOPHAGOGASTRODUODENOSCOPY    . ESOPHAGOGASTRODUODENOSCOPY (EGD) WITH PROPOFOL N/A 07/04/2015   Procedure: ESOPHAGOGASTRODUODENOSCOPY (EGD) WITH PROPOFOL;  Surgeon: Manya Silvas, MD;  Location: [facility] ENDOSCOPY;  Service: Endoscopy;  Laterality: N/A;  . HIP SURGERY Left 06/2016   Rod placed   . INTRAMEDULLARY (IM) NAIL INTERTROCHANTERIC Left 06/18/2016   Procedure: INTRAMEDULLARY (IM) NAIL INTERTROCHANTRIC;  Surgeon: Dereck Leep, MD;  Location:  [facility] ORS;  Service: Orthopedics;  Laterality: Left;  . SHOULDER ARTHROSCOPY    . SIGMOIDOSCOPY     Family History  Problem Relation Age of Onset  . Diabetes Mother   . Diabetes Father    Social History  Substance Use Topics  . Smoking status: Former Smoker    Packs/day: 1.00    Years: 60.00    Types: Cigarettes  . Smokeless tobacco: Never Used  . Alcohol use No   No Known Allergies  Prior to Admission medications    Medication Sig Start Date End Date Taking? Authorizing Provider  acetaminophen (TYLENOL) 500 MG tablet Take 1 tablet (500 mg total) by mouth every 4 (four) hours as needed for mild pain, fever or headache. 07/23/16  Yes Gladstone Lighter, MD  ADVAIR DISKUS 250-50 MCG/DOSE AEPB INHALE ONE PUFF TWICE DAILY 12/08/16  Yes Johnson, Megan P, DO  albuterol (PROVENTIL) (2.5 MG/3ML) 0.083% nebulizer solution Take 3 mLs (2.5 mg total) by nebulization every 4 (four) hours as needed for wheezing or shortness of breath. 11/26/15  Yes Johnson, Megan P, DO  amiodarone (PACERONE) 100 MG tablet Take 100 mg by mouth daily.    Yes [provider]  amLODipine (NORVASC) 5 MG tablet TAKE ONE TABLET TWICE DAILY 10/06/16  Yes Johnson, Megan P, DO  aspirin EC 81 MG tablet Take 81 mg by mouth daily.    Yes [provider]  Cholecalciferol (VITAMIN D-3) 1000 units CAPS Take 1 capsule (1,000 Units total) by mouth daily. 11/26/15  Yes Johnson, Megan P, DO  divalproex (DEPAKOTE) 250 MG DR tablet TAKE ONE TABLET BY MOUTH 3 TIMES DAILY 10/06/16  Yes Johnson, Megan P, DO  donepezil (ARICEPT) 10 MG tablet Take 1 tablet (10 mg total) by mouth at bedtime. 11/26/15  Yes Johnson, Megan P, DO  ferrous sulfate 325 (65 FE) MG tablet TAKE ONE TABLET BY MOUTH TWICE DAILY WITH A MEAL 12/11/16  Yes Johnson, Megan P, DO  furosemide (LASIX) 40 MG tablet TAKE ONE TABLET TWICE DAILY 12/11/16  Yes Johnson, Megan P, DO  gabapentin (NEURONTIN) 400 MG capsule Take 1 capsule (400 mg total) by mouth 2 (two) times daily. 12/01/16  Yes Johnson, Megan P, DO  levothyroxine (SYNTHROID, LEVOTHROID) 150 MCG tablet TAKE ONE (1) TABLET BY MOUTH EVERY DAY BEFORE BREAKFAST 12/02/16  Yes Johnson, Megan P, DO  lisinopril (PRINIVIL,ZESTRIL) 20 MG tablet TAKE ONE (1) TABLET EACH DAY 12/01/16  Yes Johnson, Megan P, DO  Melatonin 3 MG TABS Take 1 tablet (3 mg total) by mouth at bedtime. 05/28/15  Yes Johnson, Megan P, DO  metoprolol (LOPRESSOR) 50 MG tablet Take 0.5  tablets (25 mg total) by mouth 2 (two) times daily. 11/26/15  Yes Johnson, Megan P, DO  MICROLET LANCETS MISC TEST BLOOD GLUCOSE LEVEL ONCE DAILY 04/10/16  Yes Johnson, Megan P, DO  pantoprazole (PROTONIX) 40 MG tablet Take 40 mg by mouth daily.   Yes [provider]  QUEtiapine (SEROQUEL) 25 MG tablet Take 1 tablet (25 mg total) by mouth at bedtime. 11/26/15  Yes Johnson, Megan P, DO  senna-docusate (SENOKOT-S) 8.6-50 MG tablet Take 1 tablet by mouth at bedtime as needed for mild constipation. 06/23/16  Yes Demetrios Loll, MD  SPIRIVA HANDIHALER 18 MCG inhalation capsule INHALE CONTENTS OF ONE CAPSULE ONCE A DAY AS DIRECTED 12/08/16  Yes Johnson, Megan P, DO  venlafaxine XR (EFFEXOR-XR) 37.5 MG 24 hr capsule TAKE 1 CAPSULE BY MOUTH EVERY DAY WITH BREAKFAST 09/30/16  Yes Johnson, Megan P, DO  Review of Systems  Constitutional: Positive for fatigue. Negative for appetite change.  HENT: Negative for congestion, postnasal drip and sore throat.   Eyes: Negative.   Respiratory: Positive for shortness of breath. Negative for chest tightness.   Cardiovascular: Positive for leg swelling. Negative for chest pain and palpitations.  Gastrointestinal: Negative for abdominal distention and abdominal pain.  Endocrine: Negative.   Genitourinary: Negative.   Musculoskeletal: Positive for arthralgias (left ankle). Negative for back pain.  Skin: Positive for wound (pressure ulcer on left heel).  Allergic/Immunologic: Negative.   Neurological: Positive for dizziness (at times). Negative for light-headedness.  Hematological: Negative for adenopathy. Bruises/bleeds easily.  Psychiatric/Behavioral: Negative for dysphoric mood, sleep disturbance and suicidal ideas. The patient is not nervous/anxious.    Vitals:   12/25/16 1344  BP: (!) 123/54  Pulse: 64  Resp: 20  SpO2: 92%  Weight: 192 lb 6 oz (87.3 kg)  Height: 5\' 1"  (1.549 m)   Wt Readings from Last 3 Encounters:  12/25/16 192 lb 6 oz (87.3 kg)   12/01/16 190 lb 12.8 oz (86.5 kg)  10/02/16 188 lb (85.3 kg)   Lab Results  Component Value Date   CREATININE 0.83 12/01/2016   CREATININE 0.79 09/01/2016   CREATININE 0.93 08/10/2016    Physical Exam  Constitutional: Theresa Vasquez is oriented to person, place, and time. Theresa Vasquez appears well-developed and well-nourished.  HENT:  Head: Normocephalic and atraumatic.  Neck: Normal range of motion. Neck supple. No JVD present.  Cardiovascular: Normal rate and regular rhythm.   Pulmonary/Chest: Effort normal. Theresa Vasquez has no wheezes. Theresa Vasquez has no rales.  Abdominal: Soft. Theresa Vasquez exhibits no distension. There is no tenderness.  Musculoskeletal: Theresa Vasquez exhibits edema (trace edema around bilateral ankles). Theresa Vasquez exhibits no tenderness.  Neurological: Theresa Vasquez is alert and oriented to person, place, and time.  Skin: Skin is warm and dry.  Psychiatric: Theresa Vasquez has a normal mood and affect. Theresa Vasquez behavior is normal.  Nursing note and vitals reviewed.     Assessment & Plan:  1: Chronic heart failure with preserved ejection fraction- - NYHA class III - euvolemic - weighing daily during the week when Theresa Vasquez aide is there but tends to skip the weekends. Instructed to weigh every day and call for an overnight weight gain of >2 pounds or a weekly weight gain of >5 pounds - not adding salt. Discussed the importance of closely following a 2000mg  sodium diet and written information was given to them. Son does the shopping and is already reading food labels for sodium content; going to [place] today and son is aware of sodium content of that food - has home health coming out to change the dressing on Theresa Vasquez heel - last saw cardiologist Ubaldo Glassing) on 06/02/16  -- wears oxygen at 2L usually around the clock although doesn't have it on today - needs echocardiogram done; if not done by the next time Theresa Vasquez comes here, will get it scheduled  2: HTN- - BP looks good today - last saw PCP Park Liter) 12/01/16  3: Paroxysmal atrial fibrillation- -  currently rate controlled at this time - on amiodarone, amlodipine and metoprolol  - not on anticoagulation due to previous GI bleed  4: Decubitus ulcer of left heel- - currently has the wound bandaged  Patient did not bring Theresa Vasquez medications nor a list. Each medication was verbally reviewed with the patient and Theresa Vasquez was encouraged to bring the bottles to every visit to confirm accuracy of list.  Return here in 4 months or sooner for  any questions/problems before then.

## 2017-01-12 ENCOUNTER — Other Ambulatory Visit: Payer: Self-pay | Admitting: Family Medicine

## 2017-01-15 ENCOUNTER — Emergency Department: Payer: Medicare Other

## 2017-01-15 ENCOUNTER — Inpatient Hospital Stay
Admission: EM | Admit: 2017-01-15 | Discharge: 2017-01-19 | DRG: 871 | Disposition: A | Discharge status: Home Health | Payer: Medicare Other | Attending: Specialist | Admitting: Specialist

## 2017-01-15 ENCOUNTER — Encounter: Payer: Self-pay | Admitting: Emergency Medicine

## 2017-01-15 DIAGNOSIS — E872 Acidosis: Secondary | ICD-10-CM | POA: Diagnosis present

## 2017-01-15 DIAGNOSIS — L03115 Cellulitis of right lower limb: Secondary | ICD-10-CM | POA: Diagnosis present

## 2017-01-15 DIAGNOSIS — J440 Chronic obstructive pulmonary disease with acute lower respiratory infection: Secondary | ICD-10-CM | POA: Diagnosis present

## 2017-01-15 DIAGNOSIS — A419 Sepsis, unspecified organism: Principal | ICD-10-CM | POA: Diagnosis present

## 2017-01-15 DIAGNOSIS — I130 Hypertensive heart and chronic kidney disease with heart failure and stage 1 through stage 4 chronic kidney disease, or unspecified chronic kidney disease: Secondary | ICD-10-CM | POA: Diagnosis present

## 2017-01-15 DIAGNOSIS — K219 Gastro-esophageal reflux disease without esophagitis: Secondary | ICD-10-CM | POA: Diagnosis present

## 2017-01-15 DIAGNOSIS — R7989 Other specified abnormal findings of blood chemistry: Secondary | ICD-10-CM | POA: Diagnosis present

## 2017-01-15 DIAGNOSIS — R0602 Shortness of breath: Secondary | ICD-10-CM

## 2017-01-15 DIAGNOSIS — Z90710 Acquired absence of both cervix and uterus: Secondary | ICD-10-CM

## 2017-01-15 DIAGNOSIS — F0150 Vascular dementia without behavioral disturbance: Secondary | ICD-10-CM | POA: Diagnosis present

## 2017-01-15 DIAGNOSIS — E1142 Type 2 diabetes mellitus with diabetic polyneuropathy: Secondary | ICD-10-CM | POA: Diagnosis present

## 2017-01-15 DIAGNOSIS — I5032 Chronic diastolic (congestive) heart failure: Secondary | ICD-10-CM | POA: Diagnosis present

## 2017-01-15 DIAGNOSIS — J9600 Acute respiratory failure, unspecified whether with hypoxia or hypercapnia: Secondary | ICD-10-CM

## 2017-01-15 DIAGNOSIS — L89621 Pressure ulcer of left heel, stage 1: Secondary | ICD-10-CM | POA: Diagnosis present

## 2017-01-15 DIAGNOSIS — D72829 Elevated white blood cell count, unspecified: Secondary | ICD-10-CM

## 2017-01-15 DIAGNOSIS — F329 Major depressive disorder, single episode, unspecified: Secondary | ICD-10-CM | POA: Diagnosis present

## 2017-01-15 DIAGNOSIS — I509 Heart failure, unspecified: Secondary | ICD-10-CM

## 2017-01-15 DIAGNOSIS — E1122 Type 2 diabetes mellitus with diabetic chronic kidney disease: Secondary | ICD-10-CM | POA: Diagnosis present

## 2017-01-15 DIAGNOSIS — I82409 Acute embolism and thrombosis of unspecified deep veins of unspecified lower extremity: Secondary | ICD-10-CM

## 2017-01-15 DIAGNOSIS — I700 Atherosclerosis of aorta: Secondary | ICD-10-CM | POA: Diagnosis present

## 2017-01-15 DIAGNOSIS — D638 Anemia in other chronic diseases classified elsewhere: Secondary | ICD-10-CM | POA: Diagnosis present

## 2017-01-15 DIAGNOSIS — M810 Age-related osteoporosis without current pathological fracture: Secondary | ICD-10-CM | POA: Diagnosis present

## 2017-01-15 DIAGNOSIS — I480 Paroxysmal atrial fibrillation: Secondary | ICD-10-CM | POA: Diagnosis present

## 2017-01-15 DIAGNOSIS — N179 Acute kidney failure, unspecified: Secondary | ICD-10-CM | POA: Diagnosis present

## 2017-01-15 DIAGNOSIS — J9621 Acute and chronic respiratory failure with hypoxia: Secondary | ICD-10-CM | POA: Diagnosis present

## 2017-01-15 DIAGNOSIS — I959 Hypotension, unspecified: Secondary | ICD-10-CM | POA: Diagnosis not present

## 2017-01-15 DIAGNOSIS — J15211 Pneumonia due to Methicillin susceptible Staphylococcus aureus: Secondary | ICD-10-CM | POA: Diagnosis not present

## 2017-01-15 DIAGNOSIS — F419 Anxiety disorder, unspecified: Secondary | ICD-10-CM | POA: Diagnosis present

## 2017-01-15 DIAGNOSIS — E1151 Type 2 diabetes mellitus with diabetic peripheral angiopathy without gangrene: Secondary | ICD-10-CM | POA: Diagnosis present

## 2017-01-15 DIAGNOSIS — R6520 Severe sepsis without septic shock: Secondary | ICD-10-CM | POA: Diagnosis present

## 2017-01-15 DIAGNOSIS — E039 Hypothyroidism, unspecified: Secondary | ICD-10-CM | POA: Diagnosis present

## 2017-01-15 DIAGNOSIS — J9602 Acute respiratory failure with hypercapnia: Secondary | ICD-10-CM | POA: Diagnosis not present

## 2017-01-15 DIAGNOSIS — R7881 Bacteremia: Secondary | ICD-10-CM | POA: Diagnosis not present

## 2017-01-15 DIAGNOSIS — I370 Nonrheumatic pulmonary valve stenosis: Secondary | ICD-10-CM | POA: Diagnosis not present

## 2017-01-15 DIAGNOSIS — N182 Chronic kidney disease, stage 2 (mild): Secondary | ICD-10-CM | POA: Diagnosis present

## 2017-01-15 DIAGNOSIS — J9601 Acute respiratory failure with hypoxia: Secondary | ICD-10-CM | POA: Diagnosis not present

## 2017-01-15 DIAGNOSIS — J189 Pneumonia, unspecified organism: Secondary | ICD-10-CM | POA: Diagnosis present

## 2017-01-15 DIAGNOSIS — I824Y1 Acute embolism and thrombosis of unspecified deep veins of right proximal lower extremity: Secondary | ICD-10-CM

## 2017-01-15 DIAGNOSIS — J9620 Acute and chronic respiratory failure, unspecified whether with hypoxia or hypercapnia: Secondary | ICD-10-CM | POA: Diagnosis present

## 2017-01-15 DIAGNOSIS — Z87891 Personal history of nicotine dependence: Secondary | ICD-10-CM

## 2017-01-15 DIAGNOSIS — J810 Acute pulmonary edema: Secondary | ICD-10-CM | POA: Diagnosis not present

## 2017-01-15 DIAGNOSIS — R579 Shock, unspecified: Secondary | ICD-10-CM | POA: Diagnosis not present

## 2017-01-15 DIAGNOSIS — Z7982 Long term (current) use of aspirin: Secondary | ICD-10-CM

## 2017-01-15 DIAGNOSIS — I482 Chronic atrial fibrillation: Secondary | ICD-10-CM | POA: Diagnosis not present

## 2017-01-15 DIAGNOSIS — Z9981 Dependence on supplemental oxygen: Secondary | ICD-10-CM

## 2017-01-15 DIAGNOSIS — Z79899 Other long term (current) drug therapy: Secondary | ICD-10-CM

## 2017-01-15 LAB — CBC WITH DIFFERENTIAL/PLATELET
BASOS ABS: 0 10*3/uL (ref 0–0.1)
BASOS PCT: 0 %
EOS ABS: 0.1 10*3/uL (ref 0–0.7)
Eosinophils Relative: 1 %
HCT: 25.5 % — ABNORMAL LOW (ref 35.0–47.0)
Hemoglobin: 7.8 g/dL — ABNORMAL LOW (ref 12.0–16.0)
Lymphocytes Relative: 5 %
Lymphs Abs: 0.8 10*3/uL — ABNORMAL LOW (ref 1.0–3.6)
MCH: 29.4 pg (ref 26.0–34.0)
MCHC: 30.7 g/dL — AB (ref 32.0–36.0)
MCV: 95.6 fL (ref 80.0–100.0)
MONO ABS: 1.7 10*3/uL — AB (ref 0.2–0.9)
MONOS PCT: 10 %
NEUTROS ABS: 14.4 10*3/uL — AB (ref 1.4–6.5)
Neutrophils Relative %: 84 %
PLATELETS: 412 10*3/uL (ref 150–440)
RBC: 2.66 MIL/uL — ABNORMAL LOW (ref 3.80–5.20)
RDW: 16.1 % — AB (ref 11.5–14.5)
WBC: 17.1 10*3/uL — ABNORMAL HIGH (ref 3.6–11.0)

## 2017-01-15 LAB — COMPREHENSIVE METABOLIC PANEL
ALBUMIN: 3.1 g/dL — AB (ref 3.5–5.0)
ALK PHOS: 48 U/L (ref 38–126)
ALT: 17 U/L (ref 14–54)
ANION GAP: 12 (ref 5–15)
AST: 45 U/L — ABNORMAL HIGH (ref 15–41)
BILIRUBIN TOTAL: 0.7 mg/dL (ref 0.3–1.2)
BUN: 16 mg/dL (ref 6–20)
CALCIUM: 8.2 mg/dL — AB (ref 8.9–10.3)
CO2: 25 mmol/L (ref 22–32)
CREATININE: 0.81 mg/dL (ref 0.44–1.00)
Chloride: 100 mmol/L — ABNORMAL LOW (ref 101–111)
GFR calc non Af Amer: >60 mL/min (ref >60)
GLUCOSE: 176 mg/dL — AB (ref 65–99)
Potassium: 4.8 mmol/L (ref 3.5–5.1)
SODIUM: 137 mmol/L (ref 135–145)
TOTAL PROTEIN: 6.9 g/dL (ref 6.5–8.1)

## 2017-01-15 LAB — LACTIC ACID, PLASMA
LACTIC ACID, VENOUS: 2.4 mmol/L — AB (ref 0.5–1.9)
LACTIC ACID, VENOUS: 2.6 mmol/L — AB (ref 0.5–1.9)

## 2017-01-15 MED ORDER — MOMETASONE FURO-FORMOTEROL FUM 200-5 MCG/ACT IN AERO
2.0000 | INHALATION_SPRAY | Freq: Two times a day (BID) | RESPIRATORY_TRACT | Status: DC
  Administered 2017-01-15 – 2017-01-19 (×7): 2 via RESPIRATORY_TRACT
  Filled 2017-01-15: qty 8.8

## 2017-01-15 MED ORDER — ACETAMINOPHEN 325 MG PO TABS
650.0000 mg | ORAL_TABLET | Freq: Four times a day (QID) | ORAL | Status: DC | PRN
  Administered 2017-01-15 – 2017-01-16 (×2): 650 mg via ORAL
  Filled 2017-01-15 (×2): qty 2

## 2017-01-15 MED ORDER — HEPARIN SODIUM (PORCINE) 5000 UNIT/ML IJ SOLN
5000.0000 [IU] | Freq: Three times a day (TID) | INTRAMUSCULAR | Status: DC
  Administered 2017-01-15 – 2017-01-19 (×12): 5000 [IU] via SUBCUTANEOUS
  Filled 2017-01-15 (×12): qty 1

## 2017-01-15 MED ORDER — DIVALPROEX SODIUM 250 MG PO DR TAB
250.0000 mg | DELAYED_RELEASE_TABLET | Freq: Three times a day (TID) | ORAL | Status: DC
  Administered 2017-01-15 – 2017-01-19 (×11): 250 mg via ORAL
  Filled 2017-01-15 (×14): qty 1

## 2017-01-15 MED ORDER — LEVOTHYROXINE SODIUM 150 MCG PO TABS
150.0000 ug | ORAL_TABLET | Freq: Every day | ORAL | Status: DC
  Administered 2017-01-16 – 2017-01-19 (×3): 150 ug via ORAL
  Filled 2017-01-15: qty 3
  Filled 2017-01-15 (×3): qty 1
  Filled 2017-01-15 (×2): qty 3
  Filled 2017-01-15: qty 1

## 2017-01-15 MED ORDER — TIOTROPIUM BROMIDE MONOHYDRATE 18 MCG IN CAPS
18.0000 ug | ORAL_CAPSULE | Freq: Every day | RESPIRATORY_TRACT | Status: DC
  Administered 2017-01-16 – 2017-01-19 (×3): 18 ug via RESPIRATORY_TRACT
  Filled 2017-01-15: qty 5

## 2017-01-15 MED ORDER — DOCUSATE SODIUM 100 MG PO CAPS
100.0000 mg | ORAL_CAPSULE | Freq: Two times a day (BID) | ORAL | Status: DC | PRN
  Administered 2017-01-18: 100 mg via ORAL
  Filled 2017-01-15: qty 1

## 2017-01-15 MED ORDER — DEXTROSE 5 % IV SOLN
2.0000 g | Freq: Two times a day (BID) | INTRAVENOUS | Status: DC
  Administered 2017-01-15: 2 g via INTRAVENOUS
  Filled 2017-01-15 (×3): qty 2

## 2017-01-15 MED ORDER — ASPIRIN EC 81 MG PO TBEC
81.0000 mg | DELAYED_RELEASE_TABLET | Freq: Every day | ORAL | Status: DC
  Administered 2017-01-16 – 2017-01-19 (×4): 81 mg via ORAL
  Filled 2017-01-15 (×4): qty 1

## 2017-01-15 MED ORDER — VANCOMYCIN HCL IN DEXTROSE 1-5 GM/200ML-% IV SOLN
1000.0000 mg | Freq: Once | INTRAVENOUS | Status: AC
  Administered 2017-01-15: 1000 mg via INTRAVENOUS
  Filled 2017-01-15 (×2): qty 200

## 2017-01-15 MED ORDER — AMIODARONE HCL 200 MG PO TABS
100.0000 mg | ORAL_TABLET | Freq: Every day | ORAL | Status: DC
  Administered 2017-01-16 – 2017-01-19 (×4): 100 mg via ORAL
  Filled 2017-01-15 (×5): qty 1

## 2017-01-15 MED ORDER — DONEPEZIL HCL 5 MG PO TABS
10.0000 mg | ORAL_TABLET | Freq: Every day | ORAL | Status: DC
  Administered 2017-01-15 – 2017-01-18 (×4): 10 mg via ORAL
  Filled 2017-01-15 (×3): qty 2
  Filled 2017-01-15: qty 1
  Filled 2017-01-15: qty 2

## 2017-01-15 MED ORDER — SENNOSIDES-DOCUSATE SODIUM 8.6-50 MG PO TABS
1.0000 | ORAL_TABLET | Freq: Every evening | ORAL | Status: DC | PRN
  Administered 2017-01-18: 1 via ORAL
  Filled 2017-01-15: qty 1

## 2017-01-15 MED ORDER — GABAPENTIN 400 MG PO CAPS
400.0000 mg | ORAL_CAPSULE | Freq: Two times a day (BID) | ORAL | Status: DC
  Administered 2017-01-15 – 2017-01-19 (×7): 400 mg via ORAL
  Filled 2017-01-15 (×7): qty 1

## 2017-01-15 MED ORDER — VENLAFAXINE HCL ER 37.5 MG PO CP24
37.5000 mg | ORAL_CAPSULE | Freq: Every day | ORAL | Status: DC
  Administered 2017-01-16 – 2017-01-19 (×3): 37.5 mg via ORAL
  Filled 2017-01-15 (×4): qty 1

## 2017-01-15 MED ORDER — ALBUTEROL SULFATE (2.5 MG/3ML) 0.083% IN NEBU
2.5000 mg | INHALATION_SOLUTION | RESPIRATORY_TRACT | Status: DC | PRN
  Administered 2017-01-16: 2.5 mg via RESPIRATORY_TRACT
  Filled 2017-01-15: qty 3

## 2017-01-15 MED ORDER — SODIUM CHLORIDE 0.9 % IV SOLN
1250.0000 mg | INTRAVENOUS | Status: DC
  Administered 2017-01-15: 1250 mg via INTRAVENOUS
  Filled 2017-01-15 (×2): qty 1250

## 2017-01-15 MED ORDER — PIPERACILLIN-TAZOBACTAM 3.375 G IVPB 30 MIN
3.3750 g | Freq: Once | INTRAVENOUS | Status: AC
  Administered 2017-01-15: 3.375 g via INTRAVENOUS
  Filled 2017-01-15 (×2): qty 50

## 2017-01-15 MED ORDER — FERROUS SULFATE 325 (65 FE) MG PO TABS
325.0000 mg | ORAL_TABLET | Freq: Two times a day (BID) | ORAL | Status: DC
  Administered 2017-01-16 – 2017-01-19 (×7): 325 mg via ORAL
  Filled 2017-01-15 (×7): qty 1

## 2017-01-15 MED ORDER — PANTOPRAZOLE SODIUM 40 MG PO TBEC
40.0000 mg | DELAYED_RELEASE_TABLET | Freq: Every day | ORAL | Status: DC
  Administered 2017-01-16 – 2017-01-18 (×2): 40 mg via ORAL
  Filled 2017-01-15 (×2): qty 1

## 2017-01-15 MED ORDER — SODIUM CHLORIDE 0.9 % IV SOLN
INTRAVENOUS | Status: DC
  Administered 2017-01-15: 20:00:00 via INTRAVENOUS

## 2017-01-15 MED ORDER — QUETIAPINE FUMARATE 25 MG PO TABS
25.0000 mg | ORAL_TABLET | Freq: Every day | ORAL | Status: DC
  Administered 2017-01-15 – 2017-01-18 (×4): 25 mg via ORAL
  Filled 2017-01-15 (×4): qty 1

## 2017-01-15 MED ORDER — SODIUM CHLORIDE 0.9 % IV BOLUS (SEPSIS)
1000.0000 mL | Freq: Once | INTRAVENOUS | Status: AC
  Administered 2017-01-15: 1000 mL via INTRAVENOUS

## 2017-01-15 NOTE — Progress Notes (Signed)
Family Meeting Note  Advance Directive:yes  Today a meeting took place with the Patient and her son.  Patient is unable to participate due [id number] capacity demented.   The following clinical team members were present during this meeting:MD  The following were discussed:Patient's diagnosis:pneumonia, respi failure, sepsis  , Patient's progosis: Unable to determine and Goals for treatment: Full Code  Additional follow-up to be provided: treat pneumonia, follow with PMD.  Time spent during discussion:20 minutes  Theresa Vasquez, [name], MD

## 2017-01-15 NOTE — H&P (Signed)
[facility] NAME: Atlantis Delong    MR#:  [id number]  DATE OF BIRTH:  11/19/1930  DATE OF ADMISSION:  01/15/2017  PRIMARY CARE PHYSICIAN: Valerie Roys, DO   REQUESTING/REFERRING PHYSICIAN: Archie Balboa  CHIEF COMPLAINT:   Chief Complaint  Patient presents with  . Fever    HISTORY OF PRESENT ILLNESS: Trana Ressler  is a 83 y.o. female with a known history of Osteoporosis, atrial fibrillation, Barrett's esophagus, CHF, colitis, COPD, depression, diabetes, gastrointestinal hemorrhage, hypothyroidism, diabetes, peripheral vascular disease- has dementia and lives at home with 24 hours caretaker. Her son lives nearby and he checks on her every day and over the weekends. Today morning caretaker calls to her son that patient is hypoxic. They gave her nebulizer treatments and increase her oxygen as she has supplemental oxygen at home. They were able to get her oxygen saturation again more than 90% with those interventions but when son visited her after 1-2 hours he felt patient is not looking too good and also noted that she has fever so they decided to bring her to emergency room. In ER patient is noted to have bilateral pneumonia and elevated white blood cell count with sepsis so she is given to hospitalist team for further management. [name] was present in the room and he is the source of all this history and information to me.  PAST MEDICAL HISTORY:   Past Medical History:  Diagnosis Date  . Adenomatous polyps   . Age-related osteoporosis without current pathological fracture   . Anemia   . Anxiety   . Atherosclerosis of aorta ([place])   . Atrial fibrillation with RVR ([place])   . Barrett's esophagus without dysplasia   . CHF (congestive heart failure) ([place])   . Colitis   . COPD (chronic obstructive pulmonary disease) ([place])   . Dementia   . Depression   . Diabetes mellitus type II, uncontrolled ([place])   . Diverticulitis   .  Diverticulosis of intestine without perforation or abscess without bleeding   . Edema   . Essential hemorrhagic thrombocythemia ([place])   . Gastrointestinal hemorrhage   . High anion gap metabolic acidosis   . Hyperlipidemia   . Hypertension   . Hypertensive chronic kidney disease with stage 1 through stage 4 chronic kidney disease, or unspecified chronic kidney disease   . Hypothyroidism   . Hypoxemia   . Iron deficiency anemia   . Lactic acidosis   . Left bundle branch block   . Nonrheumatic mitral valve insufficiency   . Osteoarthritis   . Other psychoactive substance dependence, in remission ([place])   . Other specified symptoms and signs involving the circulatory and respiratory systems   . Paroxysmal atrial fibrillation (H[facility])    s/p cardioversion  . Personal history of peptic ulcer disease   . Pulmonary edema   . PVD (peripheral vascular disease) ([place]) 05/14/2015  . Recurrent pneumonia    history of  . Respiratory failure with hypoxia and hypercapnia ([place])   . Type 2 diabetes mellitus with diabetic chronic kidney disease ([place])   . Type 2 diabetes mellitus with diabetic polyneuropathy ([place])   . Vascular dementia without behavioral disturbance   . Vascular disorder of intestine ([place])     PAST SURGICAL HISTORY: Past Surgical History:  Procedure Laterality Date  . ABDOMINAL HYSTERECTOMY     partial  . APPENDECTOMY    . COLONOSCOPY    . ESOPHAGOGASTRODUODENOSCOPY    . ESOPHAGOGASTRODUODENOSCOPY (EGD)  WITH PROPOFOL N/A 07/04/2015   Procedure: ESOPHAGOGASTRODUODENOSCOPY (EGD) WITH PROPOFOL;  Surgeon: Manya Silvas, MD;  Location[facility] END[facility]COPY;  Service: Endoscopy;  Laterality: N/A;  . HIP SURGERY Left 06/2016   Rod placed   . INTRAMEDULLARY (IM) NAIL INTERTROCHANTERIC Left 06/18/2016   Procedure: INTRAMEDULLARY (IM) NAIL INTERTROCHANTRIC;  Surgeon: Dereck Leep, MD;  Location: [facility] ORS;  Service: Orthopedics;  Laterality: Left;  . SHOULDER ARTHROSCOPY    . SIGMOIDOSCOPY       SOCIAL HISTORY:  Social History  Substance Use Topics  . Smoking status: Former Smoker    Packs/day: 1.00    Years: 60.00    Types: Cigarettes  . Smokeless tobacco: Never Used  . Alcohol use No    FAMILY HISTORY:  Family History  Problem Relation Age of Onset  . Diabetes Mother   . Diabetes Father     DRUG ALLERGIES: No Known Allergies  REVIEW OF SYSTEMS:   Patient has dementia and not able to give any review of system.  MEDICATIONS AT HOME:  Prior to Admission medications   Medication Sig Start Date End Date Taking? Authorizing Provider  ADVAIR DISKUS 250-50 MCG/DOSE AEPB INHALE ONE PUFF TWICE DAILY 12/08/16  Yes Johnson, Megan P, DO  albuterol (PROVENTIL) (2.5 MG/3ML) 0.083% nebulizer solution Take 3 mLs (2.5 mg total) by nebulization every 4 (four) hours as needed for wheezing or shortness of breath. 11/26/15  Yes Johnson, Megan P, DO  amiodarone (PACERONE) 100 MG tablet Take 100 mg by mouth daily.    Yes [provider]  amLODipine (NORVASC) 5 MG tablet TAKE ONE TABLET TWICE DAILY 10/06/16  Yes Johnson, Megan P, DO  aspirin EC 81 MG tablet Take 81 mg by mouth daily.    Yes [provider]  Cholecalciferol (VITAMIN D-3) 1000 units CAPS Take 1 capsule (1,000 Units total) by mouth daily. 11/26/15  Yes Johnson, Megan P, DO  divalproex (DEPAKOTE) 250 MG DR tablet TAKE ONE TABLET BY MOUTH 3 TIMES DAILY 10/06/16  Yes Johnson, Megan P, DO  donepezil (ARICEPT) 10 MG tablet Take 1 tablet (10 mg total) by mouth at bedtime. 11/26/15  Yes Johnson, Megan P, DO  ferrous sulfate 325 (65 FE) MG tablet TAKE ONE TABLET BY MOUTH TWICE DAILY WITH A MEAL 12/11/16  Yes Johnson, Megan P, DO  furosemide (LASIX) 40 MG tablet TAKE ONE TABLET TWICE DAILY 12/11/16  Yes Johnson, Megan P, DO  gabapentin (NEURONTIN) 400 MG capsule Take 1 capsule (400 mg total) by mouth 2 (two) times daily. 12/01/16  Yes Johnson, Megan P, DO  levothyroxine (SYNTHROID, LEVOTHROID) 150 MCG tablet TAKE ONE (1)  TABLET BY MOUTH EVERY DAY BEFORE BREAKFAST 12/02/16  Yes Johnson, Megan P, DO  lisinopril (PRINIVIL,ZESTRIL) 20 MG tablet TAKE ONE (1) TABLET EACH DAY 12/01/16  Yes Johnson, Megan P, DO  Melatonin 3 MG TABS Take 1 tablet (3 mg total) by mouth at bedtime. 05/28/15  Yes Johnson, Megan P, DO  metoprolol tartrate (LOPRESSOR) 25 MG tablet Take 1 tablet by mouth every 12 (twelve) hours. 01/11/17  Yes [provider]  pantoprazole (PROTONIX) 40 MG tablet Take 40 mg by mouth daily.   Yes [provider]  QUEtiapine (SEROQUEL) 25 MG tablet Take 1 tablet (25 mg total) by mouth at bedtime. 11/26/15  Yes Johnson, Megan P, DO  senna-docusate (SENOKOT-S) 8.6-50 MG tablet Take 1 tablet by mouth at bedtime as needed for mild constipation. 06/23/16  Yes Demetrios Loll, MD  [facility] HANDIHALER 18 MCG inhalation capsule  INHALE CONTENTS OF ONE CAPSULE ONCE A DAY AS DIRECTED 12/08/16  Yes Johnson, Megan P, DO  venlafaxine XR (EFFEXOR-XR) 37.5 MG 24 hr capsule TAKE 1 CAPSULE BY MOUTH EVERY DAY WITH BREAKFAST 09/30/16  Yes Johnson, Megan P, DO  acetaminophen (TYLENOL) 500 MG tablet Take 1 tablet (500 mg total) by mouth every 4 (four) hours as needed for mild pain, fever or headache. 07/23/16   Gladstone Lighter, MD  MICROLET LANCETS MISC TEST BLOOD GLUCOSE LEVEL ONCE DAILY 04/10/16   Park Liter P, DO      PHYSICAL EXAMINATION:   VITAL SIGNS: Blood pressure (!) 143/101, pulse 91, temperature (!) 103.3 F (39.6 C), temperature source Oral, resp. rate (!) 29, height 5\' 3"  (1.6 m), weight 91 kg (200 lb 9.9 oz), SpO2 94 %.  GENERAL:  83 y.o.-year-old patient lying in the bed with no acute distress.  EYES: Pupils equal, round, reactive to light and accommodation. No scleral icterus. Extraocular muscles intact.  HEENT: Head atraumatic, normocephalic. Oropharynx and nasopharynx clear.  NECK:  Supple, no jugular venous distention. No thyroid enlargement, no tenderness.  LUNGS: Normal breath sounds bilaterally, no  wheezing, ilateral crepitation. No use of accessory muscles of respiration.  CARDIOVASCULAR: S1, S2 normal. No murmurs, rubs, or gallops.  ABDOMEN: Soft, nontender, nondistended. Bowel sounds present. No organomegaly or mass.  EXTREMITIES: No pedal edema, cyanosis, or clubbing. Her right leg and foot are having redness in lower half of the leg, left side leg is normal appearing. NEUROLOGIC: Cranial nerves II through XII are intact. Muscle strength 3-4/5 in all extremities. Sensation intact. Gait not checked.  PSYCHIATRIC: The patient is alert and oriented x 1.  SKIN: No obvious rash, lesion, or ulcer.   LABORATORY PANEL:   CBC  Recent Labs Lab 01/15/17 1339  WBC 17.1*  HGB 7.8*  HCT 25.5*  PLT 412  MCV 95.6  MCH 29.4  MCHC 30.7*  RDW 16.1*  LYMPHSABS 0.8*  MONOABS 1.7*  EOSABS 0.1  BASOSABS 0.0   ------------------------------------------------------------------------------------------------------------------  Chemistries   Recent Labs Lab 01/15/17 1339  NA 137  K 4.8  CL 100*  CO2 25  GLUCOSE 176*  BUN 16  CREATININE 0.81  CALCIUM 8.2*  AST 45*  ALT 17  ALKPHOS 48  BILITOT 0.7   ------------------------------------------------------------------------------------------------------------------ estimated creatinine clearance is 55.3 mL/min (by C-G formula based on SCr of 0.81 mg/dL). ------------------------------------------------------------------------------------------------------------------ No results for input(s): TSH, T4TOTAL, T3FREE, THYROIDAB in the last 72 hours.  Invalid input(s): FREET3   Coagulation profile No results for input(s): INR, PROTIME in the last 168 hours. ------------------------------------------------------------------------------------------------------------------- No results for input(s): DDIMER in the last 72  hours. -------------------------------------------------------------------------------------------------------------------  Cardiac Enzymes No results for input(s): CKMB, TROPONINI, MYOGLOBIN in the last 168 hours.  Invalid input(s): CK ------------------------------------------------------------------------------------------------------------------ Invalid input(s): POCBNP  ---------------------------------------------------------------------------------------------------------------  Urinalysis    Component Value Date/Time   COLORURINE YELLOW (A) 05/18/2016 1041   APPEARANCEUR Cloudy (A) 06/01/2016 1114   LABSPEC 1.013 05/18/2016 1041   LABSPEC 1.015 10/10/2014 1625   PHURINE 7.0 05/18/2016 1041   GLUCOSEU Negative 06/01/2016 1114   GLUCOSEU Negative 10/10/2014 1625   HGBUR 1+ (A) 05/18/2016 1041   BILIRUBINUR Negative 06/01/2016 1114   BILIRUBINUR Negative 10/10/2014 [place] 05/18/2016 1041   PROTEINUR 2+ (A) 06/01/2016 1114   PROTEINUR 30 (A) 05/18/2016 1041   NITRITE Negative 06/01/2016 1114   NITRITE NEGATIVE 05/18/2016 1041   LEUKOCYTESUR 1+ (A) 06/01/2016 1114   LEUKOCYTESUR Negative 10/10/2014 1625     RADIOLOGY:  Dg Chest Portable 1 View  Result Date: 01/15/2017 CLINICAL DATA:  Fever and increasing shortness of breath.  Hypoxia. EXAM: PORTABLE CHEST 1 VIEW COMPARISON:  07/22/2016, 05/18/2016, 08/29/2015 and 05/16/2015 as well as chest CT dated 07/22/2015 FINDINGS: The patient has diffuse infiltrates throughout the right lung inter lesser degree in the left lung base. Heart size is normal. Slight pulmonary vascular prominence. No discrete effusions. Extensive calcification in the thoracic aorta. IMPRESSION: Extensive bilateral pulmonary infiltrates, right worse than left. Aortic atherosclerosis. Electronically Signed   By: Lorriane Shire M.D.   On: 01/15/2017 15:03    EKG: Orders placed or performed during the hospital encounter of 01/15/17  . EKG  12-Lead  . EKG 12-Lead  . ED EKG 12-Lead  . ED EKG 12-Lead  . EKG 12-Lead  . EKG 12-Lead    IMPRESSION AND PLAN:  *  Sepsis secondary to bilateral pneumonia and cellulitis on right leg   IV vancomycin and cefepime. Blood cultures are sent.   IV fluids, monitor.  * Anemia of chronic disease   Continue monitoring, may need to do further workup if continue to drop with IV fluids.  * COPD   No active exacerbation symptoms, continue inhalers and nebulizer as needed.  * Hypertension   As presented with sepsis and blood pressure is normal I would like to hold oral antihypertensive medications at this time.  * A. Fib   She is not on anticoagulation, I will continue amiodarone but hold metoprolol at this time.  * Dementia   Continue Aricept and Seroquel.  All the records are reviewed and case discussed with ED provider. Management plans discussed with the patient, family and they are in agreement.  CODE STATUS: Full code. Code Status History    Date Active Date Inactive Code Status Order ID Comments User Context   07/19/2016  2:26 PM 07/24/2016 12:10 AM Full Code [id number]  Henreitta Leber, MD Inpatient   06/18/2016  1:23 AM 06/18/2016  9:47 PM Full Code [id number]  Dereck Leep, MD Inpatient   06/18/2016  1:22 AM 06/18/2016  1:23 AM Full Code [id number]  Lance Coon, MD Inpatient   05/18/2016  1:24 PM 05/20/2016  3:41 PM Full Code [id number]  Loletha Grayer, MD ED   07/18/2015  7:41 AM 07/22/2015  7:46 PM Full Code [id number]  Theodoro Grist, MD ED   05/14/2015  7:37 PM 05/18/2015  1:48 PM Full Code [id number]  Gladstone Lighter, MD Inpatient    Advance Directive Documentation     Most Recent Value  Type of Advance Directive  Healthcare Power of Attorney, Living will  Pre-existing out of facility DNR order (yellow form or pink MOST form)  -  "MOST" Form in Place?  -     Her son was present in the room during my visit.  TOTAL TIME TAKING CARE OF THIS PATIENT: 50 minutes.     Vaughan Basta M.D on 01/15/2017   Between 7am to 6pm - Pager - [phone]  After 6pm go to [email or web address] - password [email or web address] [facility]  Office  [phone]  CC: Primary care physician; Valerie Roys, DO   Note: This dictation was prepared with Dragon dictation along with smaller phrase technology. Any transcriptional errors that result from this process are unintentional.

## 2017-01-15 NOTE — ED Notes (Signed)
Hospitalist to bedside at this time 

## 2017-01-15 NOTE — ED Triage Notes (Signed)
Pt in via EMS from home with complaints of fever and hypoxia with increased oxygen requirement since yesterday.  Pt wears 2L nasal cannula at home, pt on 3L nasal cannula upon arrival.  Pt A/Ox3, vitals WDL at this time.  MD to bedside.

## 2017-01-15 NOTE — ED Provider Notes (Signed)
[facility] Emergency Department Provider Note    ____________________________________________   I have reviewed the triage vital signs and the nursing notes.   HISTORY  Chief Complaint Fever   History limited by: Dementia, some history obtained from son   HPI Theresa Vasquez is a 83 y.o. female who presents to the emergency department today because of concerns for fever, feeling unwell. Per the son the patient started feeling worse last night. Earlier in the day however she had been her normal active self. This morning the patient was complaining of feeling unwell. The patient also was noted to be decreasing on her oxygen saturation. She was falling into at least the 80s. She is normally 2 L baseline. Son states that she does have history of urinary tract infections. EMS noted the patient was febrile and heart rate in the 90s.   Past Medical History:  Diagnosis Date  . Adenomatous polyps   . Age-related osteoporosis without current pathological fracture   . Anemia   . Anxiety   . Atherosclerosis of aorta ([place])   . Atrial fibrillation with RVR ([place])   . Barrett's esophagus without dysplasia   . CHF (congestive heart failure) ([place])   . Colitis   . COPD (chronic obstructive pulmonary disease) ([place])   . Dementia   . Depression   . Diabetes mellitus type II, uncontrolled ([place])   . Diverticulitis   . Diverticulosis of intestine without perforation or abscess without bleeding   . Edema   . Essential hemorrhagic thrombocythemia ([place])   . Gastrointestinal hemorrhage   . High anion gap metabolic acidosis   . Hyperlipidemia   . Hypertension   . Hypertensive chronic kidney disease with stage 1 through stage 4 chronic kidney disease, or unspecified chronic kidney disease   . Hypothyroidism   . Hypoxemia   . Iron deficiency anemia   . Lactic acidosis   . Left bundle branch block   . Nonrheumatic mitral valve insufficiency   . Osteoarthritis   . Other  psychoactive substance dependence, in remission ([place])   . Other specified symptoms and signs involving the circulatory and respiratory systems   . Paroxysmal atrial fibrillation (HCC)    s/p cardioversion  . Personal history of peptic ulcer disease   . Pulmonary edema   . PVD (peripheral vascular disease) ([place]) 05/14/2015  . Recurrent pneumonia    history of  . Respiratory failure with hypoxia and hypercapnia ([place])   . Type 2 diabetes mellitus with diabetic chronic kidney disease ([place])   . Type 2 diabetes mellitus with diabetic polyneuropathy ([place])   . Vascular dementia without behavioral disturbance   . Vascular disorder of intestine [facility])     Patient Active Problem List   Diagnosis Date Noted  . HTN (hypertension) 08/11/2016  . Pressure injury of skin 07/21/2016  . Closed left hip fracture ([place]) 06/17/2016  . Paroxysmal atrial fibrillation ([place]) 05/28/2015  . Type 2 DM with CKD stage 2 and hypertension ([place]) 05/14/2015  . COPD, severe ([place]) 05/14/2015  . Benign hypertensive renal disease 05/14/2015  . Anxiety disorder 05/14/2015  . Diverticulosis 05/14/2015  . Abnormality of gait 05/14/2015  . Barrett's esophagus 05/14/2015  . Drug addiction in remission ([place]) 05/14/2015  . Mitral regurgitation 05/14/2015  . LBBB (left bundle branch block) 05/14/2015  . Carotid bruit 05/14/2015  . Ischemic colitis ([place]) 05/14/2015  . History of peptic ulcer disease 05/14/2015  . Chronic diastolic CHF (congestive heart failure) ([place]) 05/14/2015  . Atherosclerosis of  aorta ([place]) 05/14/2015  . Involutional osteoporosis 05/14/2015  . Essential thrombocythemia ([place]) 05/14/2015  . H/O recurrent pneumonia 05/14/2015  . Heart & renal disease, hypertensive, with heart failure ([place]) 05/14/2015  . Adult hypothyroidism 05/14/2015  . Peripheral vascular disease ([place]) 05/14/2015  . Vascular dementia with behavioral disturbance 05/14/2015  . Vascular disorder of intestine ([place]) 05/14/2015  . Iron  deficiency anemia 11/24/2014  . Alveolar hypoventilation 06/05/2014  . Arthritis, degenerative 02/20/2014    Past Surgical History:  Procedure Laterality Date  . ABDOMINAL HYSTERECTOMY     partial  . APPENDECTOMY    . COLONOSCOPY    . ESOPHAGOGASTRODUODENOSCOPY    . ESOPHAGOGASTRODUODENOSCOPY (EGD) WITH PROPOFOL N/A 07/04/2015   Procedure: ESOPHAGOGASTRODUODENOSCOPY (EGD) WITH PROPOFOL;  Surgeon: Manya Silvas, MD;  Location: [facility] ENDOSCOPY;  Service: Endoscopy;  Laterality: N/A;  . HIP SURGERY Left 06/2016   Rod placed   . INTRAMEDULLARY (IM) NAIL INTERTROCHANTERIC Left 06/18/2016   Procedure: INTRAMEDULLARY (IM) NAIL INTERTROCHANTRIC;  Surgeon: Dereck Leep, MD;  Location: [facility] ORS;  Service: Orthopedics;  Laterality: Left;  . SHOULDER ARTHROSCOPY    . SIGMOIDOSCOPY      Prior to Admission medications   Medication Sig Start Date End Date Taking? Authorizing Provider  acetaminophen (TYLENOL) 500 MG tablet Take 1 tablet (500 mg total) by mouth every 4 (four) hours as needed for mild pain, fever or headache. 07/23/16   Gladstone Lighter, MD  ADVAIR DISKUS 250-50 MCG/DOSE AEPB INHALE ONE PUFF TWICE DAILY 12/08/16   Park Liter P, DO  albuterol (PROVENTIL) (2.5 MG/3ML) 0.083% nebulizer solution Take 3 mLs (2.5 mg total) by nebulization every 4 (four) hours as needed for wheezing or shortness of breath. 11/26/15   Johnson, Megan P, DO  amiodarone (PACERONE) 100 MG tablet Take 100 mg by mouth daily.     [provider]  amLODipine (NORVASC) 5 MG tablet TAKE ONE TABLET TWICE DAILY 10/06/16   [place] Liter P, DO  aspirin EC 81 MG tablet Take 81 mg by mouth daily.     [provider]  Cholecalciferol (VITAMIN D-3) 1000 units CAPS Take 1 capsule (1,000 Units total) by mouth daily. 11/26/15   Johnson, Megan P, DO  divalproex (DEPAKOTE) 250 MG DR tablet TAKE ONE TABLET BY MOUTH 3 TIMES DAILY 10/06/16   Wynetta Emery, Megan P, DO  donepezil (ARICEPT) 10 MG tablet Take 1 tablet (10 mg  total) by mouth at bedtime. 11/26/15   Johnson, Megan P, DO  ferrous sulfate 325 (65 FE) MG tablet TAKE ONE TABLET BY MOUTH TWICE DAILY WITH A MEAL 12/11/16   Johnson, Megan P, DO  furosemide (LASIX) 40 MG tablet TAKE ONE TABLET TWICE DAILY 12/11/16   Wynetta Emery, Megan P, DO  gabapentin (NEURONTIN) 400 MG capsule Take 1 capsule (400 mg total) by mouth 2 (two) times daily. 12/01/16   Johnson, Megan P, DO  levothyroxine (SYNTHROID, LEVOTHROID) 150 MCG tablet TAKE ONE (1) TABLET BY MOUTH EVERY DAY BEFORE BREAKFAST 12/02/16   Johnson, Megan P, DO  lisinopril (PRINIVIL,ZESTRIL) 20 MG tablet TAKE ONE (1) TABLET EACH DAY 12/01/16   Johnson, Megan P, DO  Melatonin 3 MG TABS Take 1 tablet (3 mg total) by mouth at bedtime. 05/28/15   Johnson, Megan P, DO  metoprolol (LOPRESSOR) 50 MG tablet Take 0.5 tablets (25 mg total) by mouth 2 (two) times daily. 11/26/15   Johnson, Megan P, DO  MICROLET LANCETS MISC TEST BLOOD GLUCOSE LEVEL ONCE DAILY 04/10/16   Park Liter P, DO  pantoprazole (  PROTONIX) 40 MG tablet Take 40 mg by mouth daily.    [provider]  pantoprazole (PROTONIX) 40 MG tablet TAKE ONE TABLET TWICE DAILY 01/12/17   Wynetta Emery, Megan P, DO  QUEtiapine (SEROQUEL) 25 MG tablet Take 1 tablet (25 mg total) by mouth at bedtime. 11/26/15   Johnson, Megan P, DO  senna-docusate (SENOKOT-S) 8.6-50 MG tablet Take 1 tablet by mouth at bedtime as needed for mild constipation. 06/23/16   Demetrios Loll, MD  SPIRIVA HANDIHALER 18 MCG inhalation capsule INHALE CONTENTS OF ONE CAPSULE ONCE A DAY AS DIRECTED 12/08/16   Wynetta Emery, Megan P, DO  venlafaxine XR (EFFEXOR-XR) 37.5 MG 24 hr capsule TAKE 1 CAPSULE BY MOUTH EVERY DAY WITH BREAKFAST 09/30/16   Park Liter P, DO    Allergies Patient has no known allergies.  Family History  Problem Relation Age of Onset  . Diabetes Mother   . Diabetes Father     Social History Social History  Substance Use Topics  . Smoking status: Former Smoker    Packs/day: 1.00    Years:  60.00    Types: Cigarettes  . Smokeless tobacco: Never Used  . Alcohol use No    Review of Systems Unable to obtain complete ROS secondary to dementia/AMS  ____________________________________________   PHYSICAL EXAM:  VITAL SIGNS: ED Triage Vitals  Enc Vitals Group     BP 01/15/17 1325 98/74     Pulse Rate 01/15/17 1325 97     Resp 01/15/17 1325 (!) 22     Temp 01/15/17 1325 (!) 103.3 F (39.6 C)     Temp Source 01/15/17 1325 Oral     SpO2 01/15/17 1325 94 %     Weight 01/15/17 1326 200 lb 9.9 oz (91 kg)     Height 01/15/17 1326 5\' 3"  (1.6 m)   Constitutional: Awake and alert. Not oriented.  Eyes: Conjunctivae are normal.  ENT   Head: Normocephalic and atraumatic.   Nose: No congestion/rhinnorhea.   Mouth/Throat: Mucous membranes are moist.   Neck: No stridor. Hematological/Lymphatic/Immunilogical: No cervical lymphadenopathy. Cardiovascular: Normal rate, regular rhythm.  No murmurs, rubs, or gallops.  Respiratory: Tachypnea. Diminished breath sounds diffusely. Gastrointestinal: Soft and non tender. No rebound. No guarding.  Genitourinary: Deferred Musculoskeletal: Normal range of motion in all extremities. No lower extremity edema. Neurologic:  Normal speech and language. No gross focal neurologic deficits are appreciated.  Skin:  Skin is warm, dry and intact. No rash noted. Psychiatric: Mood and affect are normal. Speech and behavior are normal. Patient exhibits appropriate insight and judgment.  ____________________________________________    LABS (pertinent positives/negatives)  Labs Reviewed  COMPREHENSIVE METABOLIC PANEL - Abnormal; Notable for the following:       Result Value   Chloride 100 (*)    Glucose, Bld 176 (*)    Calcium 8.2 (*)    Albumin 3.1 (*)    AST 45 (*)    All other components within normal limits  CBC WITH DIFFERENTIAL/PLATELET - Abnormal; Notable for the following:    WBC 17.1 (*)    RBC 2.66 (*)    Hemoglobin 7.8 (*)     HCT 25.5 (*)    MCHC 30.7 (*)    RDW 16.1 (*)    Neutro Abs 14.4 (*)    Lymphs Abs 0.8 (*)    Monocytes Absolute 1.7 (*)    All other components within normal limits  LACTIC ACID, PLASMA - Abnormal; Notable for the following:    Lactic Acid, Venous 2.4 (*)  All other components within normal limits  CULTURE, BLOOD (ROUTINE X 2)  CULTURE, BLOOD (ROUTINE X 2)  URINALYSIS, ROUTINE W REFLEX MICROSCOPIC  LACTIC ACID, PLASMA     ____________________________________________   EKG  I, Nance Pear, attending physician, personally viewed and interpreted this EKG  EKG Time: 1328 Rate: 95 Rhythm: normal sinus rhythm Axis: left axis deviation Intervals: qtc 469 QRS: LBBB ST changes: no st elevation Impression: abnormal ekg   ____________________________________________    RADIOLOGY  CXR IMPRESSION: Extensive bilateral pulmonary infiltrates, right worse than left.  Aortic atherosclerosis.   ____________________________________________   PROCEDURES  Procedures  CRITICAL CARE Performed by: Nance Pear   Total critical care time: 35 minutes  Critical care time was exclusive of separately billable procedures and treating other patients.  Critical care was necessary to treat or prevent imminent or life-threatening deterioration.  Critical care was time spent personally by me on the following activities: development of treatment plan with patient and/or surrogate as well as nursing, discussions with consultants, evaluation of patient's response to treatment, examination of patient, obtaining history from patient or surrogate, ordering and performing treatments and interventions, ordering and review of laboratory studies, ordering and review of radiographic studies, pulse oximetry and re-evaluation of patient's condition.  ____________________________________________   INITIAL IMPRESSION / ASSESSMENT AND PLAN / ED COURSE  Pertinent labs & imaging  results that were available during my care of the patient were reviewed by me and considered in my medical decision making (see chart for details).  Patient's initial evaluation was concerning for sepsis given fever, heart rate greater than 90 and tachypnea. Patient was ordered for broad-spectrum IV antibiotics and IV fluids. The patient's chest x-ray is concerning for pneumonia. This would explain the patient's hypoxia. Patient will be admitted to the hospitalist service.  ____________________________________________   FINAL CLINICAL IMPRESSION(S) / ED DIAGNOSES  Final diagnoses:  Sepsis, due to unspecified organism [facility])  Community acquired pneumonia, unspecified laterality  Elevated lactic acid level  Leukocytosis, unspecified type     Note: This dictation was prepared with Dragon dictation. Any transcriptional errors that result from this process are unintentional     Nance Pear, MD 01/15/17 [phone]

## 2017-01-15 NOTE — Progress Notes (Addendum)
Pharmacy Antibiotic Note  Theresa Vasquez is a 83 y.o. female admitted on 01/15/2017 with sepsis and bilateral PNA and cellulitis right leg.  Pharmacy has been consulted for vancomycin and cefepime dosing. Pt received vancomycin 1 g IV x1 at 1407 and Zosyn 3.375 g IV x1 at 1353 in the ED.  Plan: Will continue dosing with vancomycin starting 10 h after initial dose, Vancomycin 1250 mg IV every 24 hours.  Goal trough 15-20 mcg/mL.  Cefepime 2 g IV q12h  Vancomycin trough before 4th dose of regimen ke 0.040, half life 17.33, Vd 47.5 L   Height: 5\' 3"  (160 cm) Weight: 200 lb 9.9 oz (91 kg) IBW/kg (Calculated) : 52.4  Temp (24hrs), Avg:103.3 F (39.6 C), Min:103.3 F (39.6 C), Max:103.3 F (39.6 C)   Recent Labs Lab 01/15/17 1339 01/15/17 1346  WBC 17.1*  --   CREATININE 0.81  --   LATICACIDVEN  --  2.4*    Estimated Creatinine Clearance: 55.3 mL/min (by C-G formula based on SCr of 0.81 mg/dL).    No Known Allergies  Antimicrobials this admission: Zosyn x1 6/29 Cefepime 6/29 >> Vancomycin 6/29 >>  Dose adjustments this admission:   Microbiology results: BCx x2 sent  Thank you for allowing pharmacy to be a part of this patient's care.  Rocky Morel 01/15/2017 5:28 PM

## 2017-01-16 ENCOUNTER — Inpatient Hospital Stay: Payer: Medicare Other

## 2017-01-16 ENCOUNTER — Inpatient Hospital Stay (HOSPITAL_COMMUNITY)
Admit: 2017-01-16 | Discharge: 2017-01-16 | Disposition: A | Discharge status: Home / Self Care | Payer: Medicare Other | Attending: Infectious Disease | Admitting: Infectious Disease

## 2017-01-16 DIAGNOSIS — I370 Nonrheumatic pulmonary valve stenosis: Secondary | ICD-10-CM

## 2017-01-16 LAB — HEMOGLOBIN AND HEMATOCRIT, BLOOD
HCT: 22.8 % — ABNORMAL LOW (ref 35.0–47.0)
HCT: 24.4 % — ABNORMAL LOW (ref 35.0–47.0)
HEMOGLOBIN: 6.9 g/dL — AB (ref 12.0–16.0)
HEMOGLOBIN: 7.7 g/dL — AB (ref 12.0–16.0)

## 2017-01-16 LAB — ECHOCARDIOGRAM COMPLETE
AO mean calculated velocity dopler: 201 cm/s
AOPV: 0.51 m/s
AOVTI: 60.6 cm
AV Area mean vel: 1.28 cm2
AV Peak grad: 33 mmHg
AV VEL mean LVOT/AV: 0.5
AV vel: 1.21
AVA: 1.21 cm2
AVAREAMEANVIN: 0.63 cm2/m2
AVAREAVTI: 1.28 cm2
AVAREAVTIIND: 0.6 cm2/m2
AVG: 20 mmHg
AVLVOTPG: 9 mmHg
AVPHT: 755 ms
AVPKVEL: 289 cm/s
Area-P 1/2: 2.59 cm2
CHL CUP AV PEAK INDEX: 0.63
CHL CUP AV VALUE AREA INDEX: 0.6
E decel time: 289 msec
E/e' ratio: 17.72
FS: 32 % (ref 28–44)
Height: 67 in
IVS/LV PW RATIO, ED: 1.17
LA ID, A-P, ES: 55 mm
LA diam index: 2.72 cm/m2
LA vol index: 57.9 mL/m2
LA vol: 117 mL
LAVOLA4C: 101 mL
LEFT ATRIUM END SYS DIAM: 55 mm
LV PW d: 14.3 mm — AB (ref 0.6–1.1)
LV TDI E'LATERAL: 8.35
LV TDI E'MEDIAL: 6.57
LV e' LATERAL: 8.35 cm/s
LVEEAVG: 17.72
LVEEMED: 17.72
LVOT MV VTI INDEX: 0.91 cm2/m2
LVOT MV VTI: 1.83
LVOT VTI: 28.8 cm
LVOT area: 2.54 cm2
LVOT diameter: 18 mm
LVOTPV: 146 cm/s
LVOTSV: 73 mL
LVOTVTI: 0.48 cm
Lateral S' vel: 11.8 cm/s
MV Annulus VTI: 39.9 cm
MV Dec: 289
MV M vel: 96.4
MV Peak grad: 9 mmHg
MVPKAVEL: 113 m/s
MVPKEVEL: 148 m/s
MVSPHT: 85 ms
Mean grad: 4 mmHg
RV TAPSE: 27.2 mm
Weight: 3199.32 oz

## 2017-01-16 LAB — BLOOD CULTURE ID PANEL (REFLEXED)
Acinetobacter baumannii: NOT DETECTED
CANDIDA ALBICANS: NOT DETECTED
CANDIDA PARAPSILOSIS: NOT DETECTED
CANDIDA TROPICALIS: NOT DETECTED
Candida glabrata: NOT DETECTED
Candida krusei: NOT DETECTED
ENTEROBACTER CLOACAE COMPLEX: NOT DETECTED
ENTEROBACTERIACEAE SPECIES: NOT DETECTED
Enterococcus species: NOT DETECTED
Escherichia coli: NOT DETECTED
Haemophilus influenzae: NOT DETECTED
KLEBSIELLA OXYTOCA: NOT DETECTED
KLEBSIELLA PNEUMONIAE: NOT DETECTED
Listeria monocytogenes: NOT DETECTED
Methicillin resistance: NOT DETECTED
Neisseria meningitidis: NOT DETECTED
PROTEUS SPECIES: NOT DETECTED
Pseudomonas aeruginosa: NOT DETECTED
STAPHYLOCOCCUS SPECIES: DETECTED — AB
STREPTOCOCCUS PYOGENES: NOT DETECTED
Serratia marcescens: NOT DETECTED
Staphylococcus aureus (BCID): DETECTED — AB
Streptococcus agalactiae: NOT DETECTED
Streptococcus pneumoniae: NOT DETECTED
Streptococcus species: NOT DETECTED

## 2017-01-16 LAB — PREPARE RBC (CROSSMATCH)

## 2017-01-16 LAB — URINALYSIS, ROUTINE W REFLEX MICROSCOPIC
BILIRUBIN URINE: NEGATIVE
Glucose, UA: NEGATIVE mg/dL
HGB URINE DIPSTICK: NEGATIVE
KETONES UR: NEGATIVE mg/dL
NITRITE: NEGATIVE
PROTEIN: NEGATIVE mg/dL
Specific Gravity, Urine: 1.006 (ref 1.005–1.030)
Squamous Epithelial / LPF: NONE SEEN
pH: 5 (ref 5.0–8.0)

## 2017-01-16 LAB — BASIC METABOLIC PANEL
Anion gap: 6 (ref 5–15)
BUN: 16 mg/dL (ref 6–20)
CALCIUM: 7.6 mg/dL — AB (ref 8.9–10.3)
CHLORIDE: 102 mmol/L (ref 101–111)
CO2: 29 mmol/L (ref 22–32)
CREATININE: 0.7 mg/dL (ref 0.44–1.00)
GFR calc non Af Amer: >60 mL/min (ref >60)
GLUCOSE: 113 mg/dL — AB (ref 65–99)
Potassium: 3.7 mmol/L (ref 3.5–5.1)
Sodium: 137 mmol/L (ref 135–145)

## 2017-01-16 LAB — BLOOD GAS, ARTERIAL
Acid-Base Excess: 5.3 mmol/L — ABNORMAL HIGH (ref 0.0–2.0)
Bicarbonate: 30.5 mmol/L — ABNORMAL HIGH (ref 20.0–28.0)
Delivery systems: POSITIVE
Expiratory PAP: 5
FIO2: 0.5
INSPIRATORY PAP: 15
O2 Saturation: 94.3 %
PCO2 ART: 46 mmHg (ref 32.0–48.0)
PO2 ART: 70 mmHg — AB (ref 83.0–108.0)
Patient temperature: 37
pH, Arterial: 7.43 (ref 7.350–7.450)

## 2017-01-16 LAB — MRSA PCR SCREENING: MRSA BY PCR: NEGATIVE

## 2017-01-16 LAB — CBC
HCT: 20.8 % — ABNORMAL LOW (ref 35.0–47.0)
HEMOGLOBIN: 6.5 g/dL — AB (ref 12.0–16.0)
MCH: 29.3 pg (ref 26.0–34.0)
MCHC: 31.3 g/dL — ABNORMAL LOW (ref 32.0–36.0)
MCV: 93.6 fL (ref 80.0–100.0)
PLATELETS: 336 10*3/uL (ref 150–440)
RBC: 2.22 MIL/uL — AB (ref 3.80–5.20)
RDW: 15.9 % — ABNORMAL HIGH (ref 11.5–14.5)
WBC: 10 10*3/uL (ref 3.6–11.0)

## 2017-01-16 LAB — BRAIN NATRIURETIC PEPTIDE: B Natriuretic Peptide: 742 pg/mL — ABNORMAL HIGH (ref 0.0–100.0)

## 2017-01-16 LAB — GLUCOSE, CAPILLARY: Glucose-Capillary: 207 mg/dL — ABNORMAL HIGH (ref 65–99)

## 2017-01-16 MED ORDER — ORAL CARE MOUTH RINSE
15.0000 mL | Freq: Two times a day (BID) | OROMUCOSAL | Status: DC
  Administered 2017-01-17 – 2017-01-18 (×4): 15 mL via OROMUCOSAL

## 2017-01-16 MED ORDER — CEFAZOLIN SODIUM-DEXTROSE 2-4 GM/100ML-% IV SOLN
2.0000 g | Freq: Three times a day (TID) | INTRAVENOUS | Status: DC
  Administered 2017-01-16 – 2017-01-17 (×3): 2 g via INTRAVENOUS
  Filled 2017-01-16 (×5): qty 100

## 2017-01-16 MED ORDER — FUROSEMIDE 10 MG/ML IJ SOLN
40.0000 mg | Freq: Once | INTRAMUSCULAR | Status: AC
  Administered 2017-01-16: 40 mg via INTRAVENOUS

## 2017-01-16 MED ORDER — SODIUM CHLORIDE 0.9 % IV SOLN
Freq: Once | INTRAVENOUS | Status: AC
  Administered 2017-01-16: 15:00:00 via INTRAVENOUS

## 2017-01-16 MED ORDER — CHLORHEXIDINE GLUCONATE 0.12 % MT SOLN
15.0000 mL | Freq: Two times a day (BID) | OROMUCOSAL | Status: DC
Start: 2017-01-16 — End: 2017-01-20
  Administered 2017-01-16 – 2017-01-19 (×7): 15 mL via OROMUCOSAL
  Filled 2017-01-16 (×5): qty 15

## 2017-01-16 MED ORDER — FUROSEMIDE 10 MG/ML IJ SOLN
INTRAMUSCULAR | Status: AC
  Administered 2017-01-16: 07:00:00
  Filled 2017-01-16: qty 4

## 2017-01-16 NOTE — Progress Notes (Signed)
      INFECTIOUS DISEASE ATTENDING ADDENDUM:   Date: 01/16/2017  Patient name: Theresa Vasquez  Medical record number: [id number]  Date of birth: August 03, 1930         [facility] Health Antimicrobial Management Team Staphylococcus aureus bacteremia   Staphylococcus aureus bacteremia (SAB) is associated with a high rate of complications and mortality.  Specific aspects of clinical management are critical to optimizing the outcome of patients with SAB.  Therefore, the [facility] Health Antimicrobial Management Team [facility]) has initiated an intervention aimed at improving the management of SAB at [facility].  To do so, Infectious Diseases physicians are providing an evidence-based consult for the management of all patients with SAB.     Yes No Comments  Perform follow-up blood cultures (even if the patient is afebrile) to ensure clearance of bacteremia [x]  []  Repeat blood cultures ordered by me now  Remove vascular catheter and obtain follow-up blood cultures after the removal of the catheter [x]  []  DOES SHE HAVE A CENTRAL LINE?, if SO will need to come out when able, if not, PLEASE AVOID Placing one x 5 days to ensure clearance  Perform echocardiography to evaluate for endocarditis (transthoracic ECHO is 40-50% sensitive, TEE is > 90% sensitive) [x]  []  Please keep in mind, that neither test can definitively EXCLUDE endocarditis, and that should clinical suspicion remain high for endocarditis the patient should then still be treated with an "endocarditis" duration of therapy = 6 weeks  Consult electrophysiologist to evaluate implanted cardiac device (pacemaker, ICD) []  []    Ensure source control [x]  []  Have all abscesses been drained effectively? Have deep seeded infections (septic joints or osteomyelitis) had appropriate surgical debridement?  Lungs and leg are mentioned. Does leg need imaging?    Investigate for "metastatic" sites of infection [x]  []  Does the patient have ANY symptom or physical exam  finding that would suggest a deeper infection (back or neck pain that may be suggestive of vertebral osteomyelitis or epidural abscess, muscle pain that could be a symptom of pyomyositis)?  Keep in mind that for deep seeded infections MRI imaging with contrast is preferred rather than other often insensitive tests such as plain x-rays, especially early in a patient's presentation.  Change antibiotic therapy to IV cefazolin [x]  []  Beta-lactam antibiotics are preferred for MSSA due to higher cure rates.   If on Vancomycin, goal trough should be 15 - 20 mcg/mL  Estimated duration of IV antibiotic therapy:  4-6 weeks [x]  []  Consult case management for probably prolonged outpatient IV antibiotic therapy   Will get in touch with Primary Team, Dr. Verdell Carmine to discuss  My pager is [place] 01/16/2017, 8:47 AM

## 2017-01-16 NOTE — Progress Notes (Signed)
Patient arrived to ICU on bipap lungs diminished with bilateral inspiratory and expiratory wheezes sats 96% RR 36. Alert answers questions appropriately. Large amount of urine noted, external catheter applied. VSS. Temp 100.4 axillary. Report given to [facility].

## 2017-01-16 NOTE — Consult Note (Signed)
PULMONARY / CRITICAL CARE MEDICINE   Name: FRANSHESCA CHIPMAN MRN: [id number] DOB: [birth year]    ADMISSION DATE:  01/15/2017    Cc: Hypoxia   HISTORY OF PRESENT ILLNESS:   83F with PMH significant for DHF, COPD came into the hospital with fever and worsening shortness of breath. Patient noted to have cellulitis of leg. Admitted initially to floor and transferred to ICU earlier today for fluid overload and placed on BIPAP. Given lasix with good diuretic response. Weaned off BIPAP to nasal canula. Denies any chest pain. No history of aspiration or sick contacts. No n/v/d/c.  PAST MEDICAL HISTORY :   has a past medical history of Adenomatous polyps; Age-related osteoporosis without current pathological fracture; Anemia; Anxiety; Atherosclerosis of aorta ([place]); Atrial fibrillation with RVR ([place]); Barrett's esophagus without dysplasia; CHF (congestive heart failure) ([place]); Colitis; COPD (chronic obstructive pulmonary disease) ([place]); Dementia; Depression; Diabetes mellitus type II, uncontrolled ([place]); Diverticulitis; Diverticulosis of intestine without perforation or abscess without bleeding; Edema; Essential hemorrhagic thrombocythemia ([place]); Gastrointestinal hemorrhage; High anion gap metabolic acidosis; Hyperlipidemia; Hypertension; Hypertensive chronic kidney disease with stage 1 through stage 4 chronic kidney disease, or unspecified chronic kidney disease; Hypothyroidism; Hypoxemia; Iron deficiency anemia; Lactic acidosis; Left bundle branch block; Nonrheumatic mitral valve insufficiency; Osteoarthritis; Other psychoactive substance dependence, in remission ([place]); Other specified symptoms and signs involving the circulatory and respiratory systems; Paroxysmal atrial fibrillation ([place]); Personal history of peptic ulcer disease; Pulmonary edema; PVD (peripheral vascular disease) ([place]) (05/14/2015); Recurrent pneumonia; Respiratory failure with hypoxia and hypercapnia ([place]); Type 2 diabetes mellitus with  diabetic chronic kidney disease ([place]); Type 2 diabetes mellitus with diabetic polyneuropathy ([place]); Vascular dementia without behavioral disturbance; and Vascular disorder of intestine ([place]).    has a past surgical history that includes Abdominal hysterectomy; Colonoscopy; Sigmoidoscopy; Shoulder arthroscopy; Appendectomy; Esophagogastroduodenoscopy; Esophagogastroduodenoscopy (egd) with propofol (N/A, 07/04/2015); Intramedullary (im) nail intertrochanteric (Left, 06/18/2016); and Hip surgery (Left, 06/2016).   Prior to Admission medications   Medication Sig Start Date End Date Taking? Authorizing Provider  ADVAIR DISKUS 250-50 MCG/DOSE AEPB INHALE ONE PUFF TWICE DAILY 12/08/16  Yes Johnson, Megan P, DO  albuterol (PROVENTIL) (2.5 MG/3ML) 0.083% nebulizer solution Take 3 mLs (2.5 mg total) by nebulization every 4 (four) hours as needed for wheezing or shortness of breath. 11/26/15  Yes Johnson, Megan P, DO  amiodarone (PACERONE) 100 MG tablet Take 100 mg by mouth daily.    Yes [provider]  amLODipine (NORVASC) 5 MG tablet TAKE ONE TABLET TWICE DAILY 10/06/16  Yes Johnson, Megan P, DO  aspirin EC 81 MG tablet Take 81 mg by mouth daily.    Yes [provider]  Cholecalciferol (VITAMIN D-3) 1000 units CAPS Take 1 capsule (1,000 Units total) by mouth daily. 11/26/15  Yes Johnson, Megan P, DO  divalproex (DEPAKOTE) 250 MG DR tablet TAKE ONE TABLET BY MOUTH 3 TIMES DAILY 10/06/16  Yes Johnson, Megan P, DO  donepezil (ARICEPT) 10 MG tablet Take 1 tablet (10 mg total) by mouth at bedtime. 11/26/15  Yes Johnson, Megan P, DO  ferrous sulfate 325 (65 FE) MG tablet TAKE ONE TABLET BY MOUTH TWICE DAILY WITH A MEAL 12/11/16  Yes Johnson, Megan P, DO  furosemide (LASIX) 40 MG tablet TAKE ONE TABLET TWICE DAILY 12/11/16  Yes Johnson, Megan P, DO  gabapentin (NEURONTIN) 400 MG capsule Take 1 capsule (400 mg total) by mouth 2 (two) times daily. 12/01/16  Yes Johnson, Megan P, DO  levothyroxine (SYNTHROID,  LEVOTHROID) 150 MCG tablet TAKE ONE (1) TABLET  BY MOUTH EVERY DAY BEFORE BREAKFAST 12/02/16  Yes Johnson, Megan P, DO  lisinopril (PRINIVIL,ZESTRIL) 20 MG tablet TAKE ONE (1) TABLET EACH DAY 12/01/16  Yes Johnson, Megan P, DO  Melatonin 3 MG TABS Take 1 tablet (3 mg total) by mouth at bedtime. 05/28/15  Yes Johnson, Megan P, DO  metoprolol tartrate (LOPRESSOR) 25 MG tablet Take 1 tablet by mouth every 12 (twelve) hours. 01/11/17  Yes [provider]  pantoprazole (PROTONIX) 40 MG tablet Take 40 mg by mouth daily.   Yes [provider]  QUEtiapine (SEROQUEL) 25 MG tablet Take 1 tablet (25 mg total) by mouth at bedtime. 11/26/15  Yes Johnson, Megan P, DO  senna-docusate (SENOKOT-S) 8.6-50 MG tablet Take 1 tablet by mouth at bedtime as needed for mild constipation. 06/23/16  Yes Demetrios Loll, MD  SPIRIVA HANDIHALER 18 MCG inhalation capsule INHALE CONTENTS OF ONE CAPSULE ONCE A DAY AS DIRECTED 12/08/16  Yes Johnson, Megan P, DO  venlafaxine XR (EFFEXOR-XR) 37.5 MG 24 hr capsule TAKE 1 CAPSULE BY MOUTH EVERY DAY WITH BREAKFAST 09/30/16  Yes Johnson, Megan P, DO  acetaminophen (TYLENOL) 500 MG tablet Take 1 tablet (500 mg total) by mouth every 4 (four) hours as needed for mild pain, fever or headache. 07/23/16   Gladstone Lighter, MD  MICROLET LANCETS MISC TEST BLOOD GLUCOSE LEVEL ONCE DAILY 04/10/16   Park Liter P, DO   No Known Allergies    VITAL SIGNS: Temp:  [99.4 F (37.4 C)-103.3 F (39.6 C)] 99.4 F (37.4 C) (06/30 0729) Pulse Rate:  [77-109] 86 (06/30 1012) Resp:  [17-35] 19 (06/30 1012) BP: (98-152)/(36-119) 107/44 (06/30 1000) SpO2:  [67 %-100 %] 93 % (06/30 1012) FiO2 (%):  [50 %-100 %] 50 % (06/30 1000) Weight:  [198 lb 1.6 oz (89.9 kg)-200 lb 9.9 oz (91 kg)] 199 lb 15.3 oz (90.7 kg) (06/30 0700) HEMODYNAMICS:   VENTILATOR SETTINGS: FiO2 (%):  [50 %-100 %] 50 % INTAKE / OUTPUT:  Intake/Output Summary (Last 24 hours) at 01/16/17 1135 Last data filed at 01/16/17 0600   Gross per 24 hour  Intake           2297.5 ml  Output                0 ml  Net           2297.5 ml    PHYSICAL EXAMINATION: General:  nad Neuro:  Aox3, non-focal HEENT:  [place], jvd+ Cardiovascular:  S1s2+, regular Lungs:  Decreased air entry b/l ,crackles + Abdomen:  Soft, non-tender, bs+ Skin: leg erythema +  LABS:  CBC  Recent Labs Lab 01/15/17 1339 01/16/17 0333 01/16/17 0909  WBC 17.1* 10.0  --   HGB 7.8* 6.5* 6.9*  HCT 25.5* 20.8* 22.8*  PLT 412 336  --    Coag's No results for input(s): APTT, INR in the last 168 hours. BMET  Recent Labs Lab 01/15/17 1339 01/16/17 0333  NA 137 137  K 4.8 3.7  CL 100* 102  CO2 25 29  BUN 16 16  CREATININE 0.81 0.70  GLUCOSE 176* 113*   Electrolytes  Recent Labs Lab 01/15/17 1339 01/16/17 0333  CALCIUM 8.2* 7.6*   Sepsis Markers  Recent Labs Lab 01/15/17 1346 01/15/17 1650  LATICACIDVEN 2.4* 2.6*   ABG  Recent Labs Lab 01/16/17 0830  PHART 7.43  PCO2ART 46  PO2ART 70*   Liver Enzymes  Recent Labs Lab 01/15/17 1339  AST 45*  ALT 17  ALKPHOS 48  BILITOT 0.7  ALBUMIN 3.1*   Cardiac Enzymes No results for input(s): TROPONINI, PROBNP in the last 168 hours. Glucose  Recent Labs Lab 01/16/17 0634  GLUCAP 207*    Imaging Dg Chest Port 1 View  Result Date: 01/16/2017 CLINICAL DATA:  83 year old female recently admitted with pneumonia and sepsis, acute respiratory failure today EXAM: PORTABLE CHEST 1 VIEW COMPARISON:  Prior chest x-ray 01/15/2017 FINDINGS: Cardiomegaly is unchanged. Atherosclerotic calcifications again noted in the transverse aorta. Diffuse interstitial prominence bilaterally, slightly improved compared to prior. Areas of patchy interstitial and airspace opacity are present within the right upper and left mid lung. Overall, inspiratory volumes and aeration are improved. IMPRESSION: 1. Overall, inspiratory volumes and pulmonary aeration are improved compared to yesterday likely  secondary to decreased interstitial edema. 2. Patchy airspace opacities in the right upper and left mid lung may reflect multifocal pneumonia. 3.  Aortic Atherosclerosis (ICD10-170.0) 4. Background of chronic bronchitic change and interstitial prominence. Electronically Signed   By: Jacqulynn Cadet M.D.   On: 01/16/2017 10:14   Dg Chest Portable 1 View  Result Date: 01/15/2017 CLINICAL DATA:  Fever and increasing shortness of breath.  Hypoxia. EXAM: PORTABLE CHEST 1 VIEW COMPARISON:  07/22/2016, 05/18/2016, 08/29/2015 and 05/16/2015 as well as chest CT dated 07/22/2015 FINDINGS: The patient has diffuse infiltrates throughout the right lung inter lesser degree in the left lung base. Heart size is normal. Slight pulmonary vascular prominence. No discrete effusions. Extensive calcification in the thoracic aorta. IMPRESSION: Extensive bilateral pulmonary infiltrates, right worse than left. Aortic atherosclerosis. Electronically Signed   By: Lorriane Shire M.D.   On: 01/15/2017 15:03    Cultures Blood- staph   ASSESSMENT : ACUTE HYPOXIC RESP FAILURE FLUID OVERLOAD HFPEF / AFIB SEPSIS 2 TO STAPH BACTEREMIA LACTIC ACIDOSIS ANEMIA  PLAN Maintain sats >89%. Bipap as needed and qHS.  On amiodarone. Diuretics as tolerated. Transfuse if repeat Hgb<7.  On broad spectrum antibiotics.  Full Code. Son updated at bedside.  Dimas Chyle MD PCCM    01/16/2017, 11:35 AM

## 2017-01-16 NOTE — Plan of Care (Signed)
Problem: Physical Regulation: Goal: Ability to maintain clinical measurements within normal limits will improve Outcome: Progressing Patient transitioned off bipap this morning to 6 L nasal cannula and then to 4 L nasal cannula. No report of shortness of breath by patient. One unit of blood given per orders for hemoglobin of 6.5 and then 6.9. Recheck of hemoglobin and hematocrit timed for 20:00. Patient with baseline mentation per son and visitor.

## 2017-01-16 NOTE — Progress Notes (Signed)
[place] [facility] [place] NAME: Theresa Vasquez    MR#:  [id number]  DATE OF BIRTH:  February 19, 1931  SUBJECTIVE:   Patient admitted to the hospital due to sepsis from pneumonia/cellulitis. Hemodynamically stable. Noted to be anemic. Initially was on BiPAP at weaned off of it now. Blood cultures positive for MSSA.  REVIEW OF SYSTEMS:    Review of Systems  Constitutional: Negative for chills and fever.  HENT: Negative for congestion and tinnitus.   Eyes: Negative for blurred vision and double vision.  Respiratory: Negative for cough, shortness of breath and wheezing.   Cardiovascular: Negative for chest pain, orthopnea and PND.  Gastrointestinal: Negative for abdominal pain, diarrhea, nausea and vomiting.  Genitourinary: Negative for dysuria and hematuria.  Neurological: Negative for dizziness, sensory change and focal weakness.  All other systems reviewed and are negative.   Nutrition: Heart Healthy Tolerating Diet: Yes Tolerating PT: Await Eval.   DRUG ALLERGIES:  No Known Allergies  VITALS:  Blood pressure (!) 107/44, pulse 86, temperature 99.4 F (37.4 C), temperature source Axillary, resp. rate 19, height _0  (1.702 m), weight 90.7 kg (199 lb 15.3 oz), SpO2 94 %.  PHYSICAL EXAMINATION:   Physical Exam  GENERAL:  83 y.o.-year-old patient lying in bed in no acute distress.  EYES: Pupils equal, round, reactive to light and accommodation. No scleral icterus. Extraocular muscles intact.  HEENT: Head atraumatic, normocephalic. Oropharynx and nasopharynx clear.  NECK:  Supple, no jugular venous distention. No thyroid enlargement, no tenderness.  LUNGS: Normal breath sounds bilaterally, no wheezing, rales, rhonchi. No use of accessory muscles of respiration.  CARDIOVASCULAR: S1, S2 normal. No murmurs, rubs, or gallops.  ABDOMEN: Soft, nontender, nondistended. Bowel sounds present. No organomegaly or mass.  EXTREMITIES: No cyanosis, clubbing  or edema b/l.   Signs of chronic venous stasis bilaterally, mild right lower extremity redness noted. NEUROLOGIC: Cranial nerves II through XII are intact. No focal Motor or sensory deficits b/l.   PSYCHIATRIC: The patient is alert and oriented x 3.  SKIN: No obvious rash, lesion, stage I pressure ulcer on the medial part of the left foot   LABORATORY PANEL:   CBC  Recent Labs Lab 01/16/17 0333 01/16/17 0909  WBC 10.0  --   HGB 6.5* 6.9*  HCT 20.8* 22.8*  PLT 336  --    ------------------------------------------------------------------------------------------------------------------  Chemistries   Recent Labs Lab 01/15/17 1339 01/16/17 0333  NA 137 137  K 4.8 3.7  CL 100* 102  CO2 25 29  GLUCOSE 176* 113*  BUN 16 16  CREATININE 0.81 0.70  CALCIUM 8.2* 7.6*  AST 45*  --   ALT 17  --   ALKPHOS 48  --   BILITOT 0.7  --    ------------------------------------------------------------------------------------------------------------------  Cardiac Enzymes No results for input(s): TROPONINI in the last 168 hours. ------------------------------------------------------------------------------------------------------------------  RADIOLOGY:  US Venous Img Lower Unilateral Right  Result Date: 01/16/2017 CLINICAL DATA:  Right lower extremity swelling and redness for 1 week. EXAM: RIGHT LOWER EXTREMITY VENOUS DOPPLER ULTRASOUND TECHNIQUE: Gray-scale sonography with graded compression, as well as color Doppler and duplex ultrasound were performed to evaluate the lower extremity deep venous systems from the level of the common femoral vein and including the common femoral, femoral, profunda femoral, popliteal and calf veins including the posterior tibial, peroneal and gastrocnemius veins when visible. The superficial great saphenous vein was also interrogated. Spectral Doppler was utilized to evaluate flow at rest and with distal augmentation maneuvers in  the common femoral, femoral  and popliteal veins. COMPARISON:  None. FINDINGS: Contralateral Common Femoral Vein: Respiratory phasicity is normal and symmetric with the symptomatic side. No evidence of thrombus. Normal compressibility. Common Femoral Vein: No evidence of thrombus. Normal compressibility, respiratory phasicity and response to augmentation. Saphenofemoral Junction: No evidence of thrombus. Normal compressibility and flow on color Doppler imaging. Profunda Femoral Vein: No evidence of thrombus. Normal compressibility and flow on color Doppler imaging. Femoral Vein: No evidence of thrombus. Normal compressibility, respiratory phasicity and response to augmentation. Popliteal Vein: No evidence of thrombus. Normal compressibility, respiratory phasicity and response to augmentation. Calf Veins: No evidence of thrombus. Normal compressibility and flow on color Doppler imaging. Peroneal vein not visualized. Superficial Great Saphenous Vein: No evidence of thrombus. Normal compressibility and flow on color Doppler imaging. Venous Reflux:  None. Other Findings:  None. IMPRESSION: No evidence of DVT within the right lower extremity. Electronically Signed   By: Lajean Manes M.D.   On: 01/16/2017 12:21   Dg Chest Port 1 View  Result Date: 01/16/2017 CLINICAL DATA:  83 year old female recently admitted with pneumonia and sepsis, acute respiratory failure today EXAM: PORTABLE CHEST 1 VIEW COMPARISON:  Prior chest x-ray 01/15/2017 FINDINGS: Cardiomegaly is unchanged. Atherosclerotic calcifications again noted in the transverse aorta. Diffuse interstitial prominence bilaterally, slightly improved compared to prior. Areas of patchy interstitial and airspace opacity are present within the right upper and left mid lung. Overall, inspiratory volumes and aeration are improved. IMPRESSION: 1. Overall, inspiratory volumes and pulmonary aeration are improved compared to yesterday likely secondary to decreased interstitial edema. 2. Patchy airspace  opacities in the right upper and left mid lung may reflect multifocal pneumonia. 3.  Aortic Atherosclerosis (ICD10-170.0) 4. Background of chronic bronchitic change and interstitial prominence. Electronically Signed   By: Jacqulynn Cadet M.D.   On: 01/16/2017 10:14   Dg Chest Portable 1 View  Result Date: 01/15/2017 CLINICAL DATA:  Fever and increasing shortness of breath.  Hypoxia. EXAM: PORTABLE CHEST 1 VIEW COMPARISON:  07/22/2016, 05/18/2016, 08/29/2015 and 05/16/2015 as well as chest CT dated 07/22/2015 FINDINGS: The patient has diffuse infiltrates throughout the right lung inter lesser degree in the left lung base. Heart size is normal. Slight pulmonary vascular prominence. No discrete effusions. Extensive calcification in the thoracic aorta. IMPRESSION: Extensive bilateral pulmonary infiltrates, right worse than left. Aortic atherosclerosis. Electronically Signed   By: Lorriane Shire M.D.   On: 01/15/2017 15:03     ASSESSMENT AND PLAN:   83 year old female history of diabetes, vascular dementia, peripheral vascular disease, hypothyroidism, atrial fibrillation, anxiety/depression, COPD who presented to the hospital due to fever, leukocytosis and suspected have sepsis secondary to pneumonia/cellulitis.  1. Sepsis-patient met criteria given leukocytosis, fever. Chest x-ray findings suggestive of bilateral infiltrates. -Clinically patient was hypoxic but now weaned off BiPAP. -Blood cultures were positive for MSSA. IV antibiotics have been narrowed down to Ancef. -Await echocardiogram to rule out endocarditis. Discussed plan of care with infectious disease at [facility].  2. Pneumonia-source of patient's sepsis. Likely staph pneumonia given patient's bacteremia. -Continue IV Ancef. Await echocardiogram to rule out endocarditis. -Patient clinically not hypoxic and weaned off BiPAP now.  3. Anemia of chronic disease - Hg. Down to 6.9 and will transfuse 1 unit and follow Hg.   4.  Hypothyroidism-continue Synthroid.  5. History of atrial fibrillation-rate controlled. Continue amiodarone.  6. COPD-no acute exacerbation-continue Spiriva, Dulera.  7. GERD-continue Protonix.  8. Depression-continue Effexor.  9. Dementia - cont. Aricept.   All the  records are reviewed and case discussed with Care Management/Social Worker. Management plans discussed with the patient, family and they are in agreement.  CODE STATUS: Full code  DVT Prophylaxis: Full code  TOTAL TIME TAKING CARE OF THIS PATIENT: 30 minutes.   POSSIBLE D/C IN 2-3 DAYS, DEPENDING ON CLINICAL CONDITION.   Henreitta Leber M.D on 01/16/2017 at 1:56 PM  Between 7am to 6pm - Pager - [phone]  After 6pm go to [email or web address] - [employer] brewer [facility]  Office  [phone]  CC: Primary care physician; Valerie Roys, DO

## 2017-01-16 NOTE — Progress Notes (Signed)
Decreased Fio2 to 50% and Back up rate to 12

## 2017-01-16 NOTE — Progress Notes (Signed)
PHARMACY - PHYSICIAN COMMUNICATION CRITICAL VALUE ALERT - BLOOD CULTURE IDENTIFICATION (BCID)  Results for orders placed or performed during the hospital encounter of 01/15/17  Blood Culture ID Panel (Reflexed) (Collected: 01/15/2017  1:39 PM)  Result Value Ref Range   Enterococcus species NOT DETECTED NOT DETECTED   Listeria monocytogenes NOT DETECTED NOT DETECTED   Staphylococcus species DETECTED (A) NOT DETECTED   Staphylococcus aureus DETECTED (A) NOT DETECTED   Methicillin resistance NOT DETECTED NOT DETECTED   Streptococcus species NOT DETECTED NOT DETECTED   Streptococcus agalactiae NOT DETECTED NOT DETECTED   Streptococcus pneumoniae NOT DETECTED NOT DETECTED   Streptococcus pyogenes NOT DETECTED NOT DETECTED   Acinetobacter baumannii NOT DETECTED NOT DETECTED   Enterobacteriaceae species NOT DETECTED NOT DETECTED   Enterobacter cloacae complex NOT DETECTED NOT DETECTED   Escherichia coli NOT DETECTED NOT DETECTED   Klebsiella oxytoca NOT DETECTED NOT DETECTED   Klebsiella pneumoniae NOT DETECTED NOT DETECTED   Proteus species NOT DETECTED NOT DETECTED   Serratia marcescens NOT DETECTED NOT DETECTED   Haemophilus influenzae NOT DETECTED NOT DETECTED   Neisseria meningitidis NOT DETECTED NOT DETECTED   Pseudomonas aeruginosa NOT DETECTED NOT DETECTED   Candida albicans NOT DETECTED NOT DETECTED   Candida glabrata NOT DETECTED NOT DETECTED   Candida krusei NOT DETECTED NOT DETECTED   Candida parapsilosis NOT DETECTED NOT DETECTED   Candida tropicalis NOT DETECTED NOT DETECTED    Name of physician (or Provider) Contacted: n/a - ID physician de-escalated antimicrobials after BCID result.   Changes made to prescribed antibiotics: Vancomycin and cefepime discontinued and cefazolin started.  Will order cefazolin 2 g IV q8h  Lenis Noon, PharmD, BCPS Clinical Pharmacist 01/16/2017  9:25 AM

## 2017-01-16 NOTE — Progress Notes (Signed)
~  0600 Rt called to give breathing tx. Upon arrival to room pt in resp distress RR >30 Sao2 in the 60's, pt appearance is mottled and ashen. Pt given a breathing tx and received an order from Dr. Marcille Blanco to place pt on Bipap. Pt placed on Bipap ~0610 10/5 RR 20 100%.  Pt then transferred to ICU bed. Pt remains on bipap Sao2 100% Hr 109 and responding appropriately.

## 2017-01-16 NOTE — Progress Notes (Signed)
Called to room pt short of breath and wheezing. Repositioned in bed, sats low 60's. Md notified and present on floor orders taken and supervisor aware, respiratory at bedside placing on non rebreathe and then bipap. Orders for transfer to CCU

## 2017-01-16 NOTE — Progress Notes (Signed)
Discussed with MD during rounds about patient's hemoglobin level of 6.5 this morning and patient's chronic anemia per report from patient's son (currently at bedside with RN and MD). MD ordered hemoglobin and hematocrit for noon and if hemoglobin still below 7 at noon for patient to receive one unit of packed red blood cells. MD discussed blood transfusion with son who had no questions. Team will continue to monitor.

## 2017-01-17 ENCOUNTER — Inpatient Hospital Stay: Payer: Medicare Other

## 2017-01-17 LAB — CBC
HCT: 24.9 % — ABNORMAL LOW (ref 35.0–47.0)
Hemoglobin: 8 g/dL — ABNORMAL LOW (ref 12.0–16.0)
MCH: 29.7 pg (ref 26.0–34.0)
MCHC: 32.1 g/dL (ref 32.0–36.0)
MCV: 92.6 fL (ref 80.0–100.0)
PLATELETS: 331 10*3/uL (ref 150–440)
RBC: 2.69 MIL/uL — AB (ref 3.80–5.20)
RDW: 17.5 % — AB (ref 11.5–14.5)
WBC: 8.7 10*3/uL (ref 3.6–11.0)

## 2017-01-17 LAB — BASIC METABOLIC PANEL
ANION GAP: 5 (ref 5–15)
ANION GAP: 7 (ref 5–15)
BUN: 12 mg/dL (ref 6–20)
BUN: 13 mg/dL (ref 6–20)
CALCIUM: 8 mg/dL — AB (ref 8.9–10.3)
CO2: 31 mmol/L (ref 22–32)
CO2: 31 mmol/L (ref 22–32)
Calcium: 7.8 mg/dL — ABNORMAL LOW (ref 8.9–10.3)
Chloride: 102 mmol/L (ref 101–111)
Chloride: 98 mmol/L — ABNORMAL LOW (ref 101–111)
Creatinine, Ser: 0.72 mg/dL (ref 0.44–1.00)
Creatinine, Ser: 0.63 mg/dL (ref 0.44–1.00)
GFR calc Af Amer: >60 mL/min (ref >60)
GFR calc Af Amer: >60 mL/min (ref >60)
GFR calc non Af Amer: >60 mL/min (ref >60)
GLUCOSE: 173 mg/dL — AB (ref 65–99)
Glucose, Bld: 137 mg/dL — ABNORMAL HIGH (ref 65–99)
POTASSIUM: 3.7 mmol/L (ref 3.5–5.1)
POTASSIUM: 3.8 mmol/L (ref 3.5–5.1)
SODIUM: 138 mmol/L (ref 135–145)
Sodium: 136 mmol/L (ref 135–145)

## 2017-01-17 LAB — BPAM RBC
BLOOD PRODUCT EXPIRATION DATE: 201807122359
ISSUE DATE / TIME: 201806301435
Unit Type and Rh: 5100

## 2017-01-17 LAB — BLOOD GAS, ARTERIAL
Acid-Base Excess: 5.9 mmol/L — ABNORMAL HIGH (ref 0.0–2.0)
Bicarbonate: 31.1 mmol/L — ABNORMAL HIGH (ref 20.0–28.0)
DELIVERY SYSTEMS: POSITIVE
EXPIRATORY PAP: 5
FIO2: 0.6
INSPIRATORY PAP: 14
O2 SAT: 97.2 %
Patient temperature: 37
RATE: 12 resp/min
pCO2 arterial: 48 mmHg (ref 32.0–48.0)
pH, Arterial: 7.42 (ref 7.350–7.450)
pO2, Arterial: 91 mmHg (ref 83.0–108.0)

## 2017-01-17 LAB — TYPE AND SCREEN
ABO/RH(D): O POS
Antibody Screen: NEGATIVE
Unit division: 0

## 2017-01-17 LAB — MAGNESIUM: Magnesium: 1.9 mg/dL (ref 1.7–2.4)

## 2017-01-17 LAB — PHOSPHORUS: Phosphorus: 3.3 mg/dL (ref 2.5–4.6)

## 2017-01-17 MED ORDER — ETOMIDATE 2 MG/ML IV SOLN
INTRAVENOUS | Status: AC
  Filled 2017-01-17: qty 10

## 2017-01-17 MED ORDER — PIPERACILLIN-TAZOBACTAM 3.375 G IVPB
3.3750 g | Freq: Three times a day (TID) | INTRAVENOUS | Status: DC
  Administered 2017-01-17: 3.375 g via INTRAVENOUS
  Filled 2017-01-17 (×4): qty 50

## 2017-01-17 MED ORDER — FENTANYL CITRATE (PF) 100 MCG/2ML IJ SOLN
INTRAMUSCULAR | Status: AC
  Filled 2017-01-17: qty 2

## 2017-01-17 MED ORDER — MORPHINE SULFATE (CONCENTRATE) 10 MG/0.5ML PO SOLN
10.0000 mg | ORAL | Status: DC | PRN
  Administered 2017-01-17 – 2017-01-18 (×3): 10 mg via ORAL
  Filled 2017-01-17 (×4): qty 1

## 2017-01-17 MED ORDER — MIDAZOLAM HCL 2 MG/2ML IJ SOLN
INTRAMUSCULAR | Status: AC
  Filled 2017-01-17: qty 4

## 2017-01-17 MED ORDER — MIDAZOLAM HCL 2 MG/2ML IJ SOLN: 4.0000 mg | Freq: Once | INTRAMUSCULAR | Status: DC

## 2017-01-17 MED ORDER — FENTANYL CITRATE (PF) 100 MCG/2ML IJ SOLN: 100.0000 ug | Freq: Once | INTRAMUSCULAR | Status: DC

## 2017-01-17 MED ORDER — ETOMIDATE 2 MG/ML IV SOLN
0.3000 mg/kg | Freq: Once | INTRAVENOUS | Status: DC
  Filled 2017-01-17: qty 20

## 2017-01-17 MED ORDER — BLISTEX MEDICATED EX OINT
TOPICAL_OINTMENT | CUTANEOUS | Status: DC | PRN
  Filled 2017-01-17: qty 6.3

## 2017-01-17 MED ORDER — FUROSEMIDE 10 MG/ML IJ SOLN
20.0000 mg | Freq: Two times a day (BID) | INTRAMUSCULAR | Status: DC
  Administered 2017-01-17 – 2017-01-18 (×2): 20 mg via INTRAVENOUS
  Filled 2017-01-17 (×2): qty 2

## 2017-01-17 MED ORDER — FUROSEMIDE 10 MG/ML IJ SOLN
INTRAMUSCULAR | Status: AC
  Administered 2017-01-17: 20 mg via INTRAVENOUS
  Filled 2017-01-17: qty 2

## 2017-01-17 MED ORDER — FUROSEMIDE 10 MG/ML IJ SOLN
20.0000 mg | Freq: Once | INTRAMUSCULAR | Status: AC
  Administered 2017-01-17: 20 mg via INTRAVENOUS

## 2017-01-17 MED ORDER — CEFAZOLIN SODIUM-DEXTROSE 1-4 GM/50ML-% IV SOLN
1.0000 g | Freq: Three times a day (TID) | INTRAVENOUS | Status: DC
  Administered 2017-01-17 – 2017-01-18 (×3): 1 g via INTRAVENOUS
  Filled 2017-01-17 (×4): qty 50

## 2017-01-17 MED ORDER — ALPRAZOLAM 0.25 MG PO TABS
0.2500 mg | ORAL_TABLET | Freq: Three times a day (TID) | ORAL | Status: DC | PRN
  Administered 2017-01-17 (×2): 0.25 mg via ORAL
  Filled 2017-01-17 (×2): qty 1

## 2017-01-17 NOTE — Progress Notes (Signed)
ANTIBIOTIC CONSULT NOTE - INITIAL  Pharmacy Consult for Cefazolin  Indication: bacteremia  No Known Allergies  Patient Measurements: Height: 5\' 7"  (170.2 cm) Weight: 199 lb 15.3 oz (90.7 kg) IBW/kg (Calculated) : 61.6 Adjusted Body Weight:   Vital Signs: Temp: 98.4 F (36.9 C) (07/01 1400) Temp Source: Oral (07/01 1400) BP: 133/57 (07/01 1800) Pulse Rate: 86 (07/01 1800) Intake/Output from previous day: 06/30 0701 - 07/01 0700 In: 2321 [P.O.:1810; I.V.:1; Blood:310; IV Piggyback:200] Out: 1350 [Urine:1350] Intake/Output from this shift: Total I/O In: 510 [P.O.:360; IV Piggyback:150] Out: 730 [Urine:730]  Labs:  Recent Labs  01/15/17 1339 01/16/17 0333 01/16/17 0909 01/16/17 2015 01/17/17 0531  WBC 17.1* 10.0  --   --  8.7  HGB 7.8* 6.5* 6.9* 7.7* 8.0*  PLT 412 336  --   --  331  CREATININE 0.81 0.70  --   --  0.72   Estimated Creatinine Clearance: 60.5 mL/min (by C-G formula based on SCr of 0.72 mg/dL). No results for input(s): VANCOTROUGH, VANCOPEAK, VANCORANDOM, GENTTROUGH, GENTPEAK, GENTRANDOM, TOBRATROUGH, TOBRAPEAK, TOBRARND, AMIKACINPEAK, AMIKACINTROU, AMIKACIN in the last 72 hours.   Microbiology: Recent Results (from the past 720 hour(s))  Blood Culture (routine x 2)     Status: Abnormal (Preliminary result)   Collection Time: 01/15/17  1:39 PM  Result Value Ref Range Status   Specimen Description BLOOD BLOOD RIGHT FOREARM  Final   Special Requests   Final    BOTTLES DRAWN AEROBIC AND ANAEROBIC Blood Culture results may not be optimal due to an excessive volume of blood received in culture bottles   Culture  Setup Time   Final    GRAM POSITIVE COCCI IN BOTH AEROBIC AND ANAEROBIC BOTTLES CRITICAL RESULT CALLED TO, READ BACK BY AND VERIFIED WITHTheodis Shove @ 4166 01/16/17 by [facility]    Culture (A)  Final    STAPHYLOCOCCUS AUREUS SUSCEPTIBILITIES TO FOLLOW Performed at [facility], [place] [address], [place], [place] [address]    Report Status  PENDING  Incomplete  Blood Culture (routine x 2)     Status: None (Preliminary result)   Collection Time: 01/15/17  1:39 PM  Result Value Ref Range Status   Specimen Description BLOOD BLOOD LEFT FOREARM  Final   Special Requests   Final    BOTTLES DRAWN AEROBIC AND ANAEROBIC Blood Culture results may not be optimal due to an excessive volume of blood received in culture bottles   Culture NO GROWTH 2 DAYS  Final   Report Status PENDING  Incomplete  Blood Culture ID Panel (Reflexed)     Status: Abnormal   Collection Time: 01/15/17  1:39 PM  Result Value Ref Range Status   Enterococcus species NOT DETECTED NOT DETECTED Final   Listeria monocytogenes NOT DETECTED NOT DETECTED Final   Staphylococcus species DETECTED (A) NOT DETECTED Final    Comment: CRITICAL RESULT CALLED TO, READ BACK BY AND VERIFIED WITHTheodis Shove @ 6010 01/16/17 by [facility]    Staphylococcus aureus DETECTED (A) NOT DETECTED Final    Comment: Methicillin (oxacillin) susceptible Staphylococcus aureus (MSSA). Preferred therapy is anti staphylococcal beta lactam antibiotic (Cefazolin or Nafcillin), unless clinically contraindicated. CRITICAL RESULT CALLED TO, READ BACK BY AND VERIFIED WITH: Theodis Shove @ 9323 01/16/17 by [facility]    Methicillin resistance NOT DETECTED NOT DETECTED Final   Streptococcus species NOT DETECTED NOT DETECTED Final   Streptococcus agalactiae NOT DETECTED NOT DETECTED Final   Streptococcus pneumoniae NOT DETECTED NOT DETECTED Final   Streptococcus pyogenes NOT  DETECTED NOT DETECTED Final   Acinetobacter baumannii NOT DETECTED NOT DETECTED Final   Enterobacteriaceae species NOT DETECTED NOT DETECTED Final   Enterobacter cloacae complex NOT DETECTED NOT DETECTED Final   Escherichia coli NOT DETECTED NOT DETECTED Final   Klebsiella oxytoca NOT DETECTED NOT DETECTED Final   Klebsiella pneumoniae NOT DETECTED NOT DETECTED Final   Proteus species NOT DETECTED NOT DETECTED Final   Serratia marcescens NOT  DETECTED NOT DETECTED Final   Haemophilus influenzae NOT DETECTED NOT DETECTED Final   Neisseria meningitidis NOT DETECTED NOT DETECTED Final   Pseudomonas aeruginosa NOT DETECTED NOT DETECTED Final   Candida albicans NOT DETECTED NOT DETECTED Final   Candida glabrata NOT DETECTED NOT DETECTED Final   Candida krusei NOT DETECTED NOT DETECTED Final   Candida parapsilosis NOT DETECTED NOT DETECTED Final   Candida tropicalis NOT DETECTED NOT DETECTED Final  MRSA PCR Screening     Status: None   Collection Time: 01/16/17  7:32 AM  Result Value Ref Range Status   MRSA by PCR NEGATIVE NEGATIVE Final    Comment:        The GeneXpert MRSA Assay (FDA approved for NASAL specimens only), is one component of a comprehensive MRSA colonization surveillance program. It is not intended to diagnose MRSA infection nor to guide or monitor treatment for MRSA infections.   Culture, blood (Routine X 2) w Reflex to ID Panel     Status: None (Preliminary result)   Collection Time: 01/16/17  9:04 AM  Result Value Ref Range Status   Specimen Description BLOOD LEFT ANTECUBITAL  Final   Special Requests   Final    BOTTLES DRAWN AEROBIC AND ANAEROBIC Blood Culture adequate volume   Culture NO GROWTH < 24 HOURS  Final   Report Status PENDING  Incomplete  Culture, blood (Routine X 2) w Reflex to ID Panel     Status: None (Preliminary result)   Collection Time: 01/16/17  9:04 AM  Result Value Ref Range Status   Specimen Description BLOOD RIGHT ANTECUBITAL  Final   Special Requests   Final    BOTTLES DRAWN AEROBIC AND ANAEROBIC Blood Culture results may not be optimal due to an excessive volume of blood received in culture bottles   Culture NO GROWTH < 24 HOURS  Final   Report Status PENDING  Incomplete  Aerobic Culture (superficial specimen)     Status: None (Preliminary result)   Collection Time: 01/16/17 11:11 AM  Result Value Ref Range Status   Specimen Description WOUND ANKLE  Final   Special  Requests NONE  Final   Gram Stain   Final    FEW WBC PRESENT, PREDOMINANTLY PMN ABUNDANT GRAM POSITIVE COCCI IN PAIRS FEW GRAM POSITIVE RODS    Culture   Final    MODERATE STAPHYLOCOCCUS AUREUS SUSCEPTIBILITIES TO FOLLOW Performed at [facility], [place] [address] [place], [place] [address]    Report Status PENDING  Incomplete    Medical History: Past Medical History:  Diagnosis Date  . Adenomatous polyps   . Age-related osteoporosis without current pathological fracture   . Anemia   . Anxiety   . Atherosclerosis of aorta ([place])   . Atrial fibrillation with RVR ([place])   . Barrett's esophagus without dysplasia   . CHF (congestive heart failure) ([place])   . Colitis   . COPD (chronic obstructive pulmonary disease) ([place])   . Dementia   . Depression   . Diabetes mellitus type II, uncontrolled ([place])   .  Diverticulitis   . Diverticulosis of intestine without perforation or abscess without bleeding   . Edema   . Essential hemorrhagic thrombocythemia ([place])   . Gastrointestinal hemorrhage   . High anion gap metabolic acidosis   . Hyperlipidemia   . Hypertension   . Hypertensive chronic kidney disease with stage 1 through stage 4 chronic kidney disease, or unspecified chronic kidney disease   . Hypothyroidism   . Hypoxemia   . Iron deficiency anemia   . Lactic acidosis   . Left bundle branch block   . Nonrheumatic mitral valve insufficiency   . Osteoarthritis   . Other psychoactive substance dependence, in remission ([place])   . Other specified symptoms and signs involving the circulatory and respiratory systems   . Paroxysmal atrial fibrillation (H[facility])    s/p cardioversion  . Personal history of peptic ulcer disease   . Pulmonary edema   . PVD (peripheral vascular disease) ([place]) 05/14/2015  . Recurrent pneumonia    history of  . Respiratory failure with hypoxia and hypercapnia ([place])   . Type 2 diabetes mellitus with diabetic chronic kidney disease ([place])   . Type 2 diabetes  mellitus with diabetic polyneuropathy ([place])   . Vascular dementia without behavioral disturbance   . Vascular disorder of intestine (H[facility])     Medications:  Scheduled:  . amiodarone  100 mg Oral Daily  . aspirin EC  81 mg Oral Daily  . chlorhexidine  15 mL Mouth Rinse BID  . divalproex  250 mg Oral Q8H  . donepezil  10 mg Oral QHS  . etomidate  0.3 mg/kg Intravenous Once  . fentaNYL (SUBLIMAZE) injection  100 mcg Intravenous Once  . ferrous sulfate  325 mg Oral BID WC  . furosemide  20 mg Intravenous BID  . gabapentin  400 mg Oral BID  . heparin  5,000 Units Subcutaneous Q8H  . levothyroxine  150 mcg Oral QAC breakfast  . mouth rinse  15 mL Mouth Rinse q12n4p  . midazolam  4 mg Intravenous Once  . mometasone-formoterol  2 puff Inhalation BID  . pantoprazole  40 mg Oral Daily  . QUEtiapine  25 mg Oral QHS  . tiotropium  18 mcg Inhalation Daily  . venlafaxine XR  37.5 mg Oral Q breakfast   Assessment: CrCl = 60.5 ml/min   Goal of Therapy:  resolution of infection  Plan:  Expected duration 7 days with resolution of temperature and/or normalization of WBC   Cefazolin 1 gm IV Q8H ordered to start on 7/1 @ 20:00.  Theresa Vasquez 01/17/2017,6:59 PM

## 2017-01-17 NOTE — Progress Notes (Signed)
Pnt has been awake most of night. Pnt currently asleep. No complaints of pain, SOB or discomfort. Pure wick has been changed twice overnight due to leaking and bed linen needing to be changed. Suction set between 12mmhg and 92mmhg. No other issues at this time.

## 2017-01-17 NOTE — Progress Notes (Signed)
   01/17/17 1800  Vitals  BP (!) 133/57  MAP (mmHg) 80  Pulse Rate 86  ECG Heart Rate 87  Resp 20  Oxygen Therapy  SpO2 95 %  O2 Device Nasal Cannula  O2 Flow Rate (L/min) 4 L/min  Pulse Oximetry Type Continuous

## 2017-01-17 NOTE — Progress Notes (Signed)
[facility] [place] NAME: Theresa Vasquez    MR#:  [id number]  DATE OF BIRTH:  1930/11/18  SUBJECTIVE:   Patient admitted to the hospital due to sepsis from pneumonia/cellulitis. Noted to be short of breath and hypoxic this morning and placed back on BiPAP. Family at bedside. No other acute events overnight. remains hemodynamically stable.  REVIEW OF SYSTEMS:    Review of Systems  Constitutional: Negative for chills and fever.  HENT: Negative for congestion and tinnitus.   Eyes: Negative for blurred vision and double vision.  Respiratory: Positive for shortness of breath. Negative for cough and wheezing.   Cardiovascular: Negative for chest pain, orthopnea and PND.  Gastrointestinal: Negative for abdominal pain, diarrhea, nausea and vomiting.  Genitourinary: Negative for dysuria and hematuria.  Neurological: Negative for dizziness, sensory change and focal weakness.  All other systems reviewed and are negative.   Nutrition: Heart Healthy Tolerating Diet: Yes Tolerating PT: Await Eval.   DRUG ALLERGIES:  No Known Allergies  VITALS:  Blood pressure 118/66, pulse 88, temperature 98.6 F (37 C), temperature source Axillary, resp. rate (!) 25, height 5' 7"  (1.702 m), weight 90.7 kg (199 lb 15.3 oz), SpO2 95 %.  PHYSICAL EXAMINATION:   Physical Exam  GENERAL:  83 y.o.-year-old patient lying in bed in mild Resp. Distress due on Bipap.  EYES: Pupils equal, round, reactive to light and accommodation. No scleral icterus. Extraocular muscles intact.  HEENT: Head atraumatic, normocephalic. Oropharynx and nasopharynx clear.  NECK:  Supple, no jugular venous distention. No thyroid enlargement, no tenderness.  LUNGS: Normal breath sounds bilaterally, no wheezing, rales, rhonchi. + use of accessory muscles of respiration.  CARDIOVASCULAR: S1, S2 normal. No murmurs, rubs, or gallops.  ABDOMEN: Soft, nontender, nondistended. Bowel sounds present.  No organomegaly or mass.  EXTREMITIES: No cyanosis, clubbing or edema b/l.   Signs of chronic venous stasis bilaterally, mild right lower extremity redness noted. NEUROLOGIC: Cranial nerves II through XII are intact. No focal Motor or sensory deficits b/l.   PSYCHIATRIC: The patient is alert and oriented x 3.  SKIN: No obvious rash, lesion, stage I pressure ulcer on the medial part of the left foot   LABORATORY PANEL:   CBC  Recent Labs Lab 01/17/17 0531  WBC 8.7  HGB 8.0*  HCT 24.9*  PLT 331   ------------------------------------------------------------------------------------------------------------------  Chemistries   Recent Labs Lab 01/15/17 1339  01/17/17 0531  NA 137  < > 138  K 4.8  < > 3.7  CL 100*  < > 102  CO2 25  < > 31  GLUCOSE 176*  < > 137*  BUN 16  < > 12  CREATININE 0.81  < > 0.72  CALCIUM 8.2*  < > 8.0*  AST 45*  --   --   ALT 17  --   --   ALKPHOS 48  --   --   BILITOT 0.7  --   --   < > = values in this interval not displayed. ------------------------------------------------------------------------------------------------------------------  Cardiac Enzymes No results for input(s): TROPONINI in the last 168 hours. ------------------------------------------------------------------------------------------------------------------  RADIOLOGY:  US Venous Img Lower Unilateral Right  Result Date: 01/16/2017 CLINICAL DATA:  Right lower extremity swelling and redness for 1 week. EXAM: RIGHT LOWER EXTREMITY VENOUS DOPPLER ULTRASOUND TECHNIQUE: Gray-scale sonography with graded compression, as well as color Doppler and duplex ultrasound were performed to evaluate the lower extremity deep venous systems from the level of the common femoral vein  and including the common femoral, femoral, profunda femoral, popliteal and calf veins including the posterior tibial, peroneal and gastrocnemius veins when visible. The superficial great saphenous vein was also  interrogated. Spectral Doppler was utilized to evaluate flow at rest and with distal augmentation maneuvers in the common femoral, femoral and popliteal veins. COMPARISON:  None. FINDINGS: Contralateral Common Femoral Vein: Respiratory phasicity is normal and symmetric with the symptomatic side. No evidence of thrombus. Normal compressibility. Common Femoral Vein: No evidence of thrombus. Normal compressibility, respiratory phasicity and response to augmentation. Saphenofemoral Junction: No evidence of thrombus. Normal compressibility and flow on color Doppler imaging. Profunda Femoral Vein: No evidence of thrombus. Normal compressibility and flow on color Doppler imaging. Femoral Vein: No evidence of thrombus. Normal compressibility, respiratory phasicity and response to augmentation. Popliteal Vein: No evidence of thrombus. Normal compressibility, respiratory phasicity and response to augmentation. Calf Veins: No evidence of thrombus. Normal compressibility and flow on color Doppler imaging. Peroneal vein not visualized. Superficial Great Saphenous Vein: No evidence of thrombus. Normal compressibility and flow on color Doppler imaging. Venous Reflux:  None. Other Findings:  None. IMPRESSION: No evidence of DVT within the right lower extremity. Electronically Signed   By: Lajean Manes M.D.   On: 01/16/2017 12:21   Dg Chest Port 1 View  Result Date: 01/17/2017 CLINICAL DATA:  Worsening shortness breath.  Pneumonia.  Sepsis. EXAM: PORTABLE CHEST 1 VIEW COMPARISON:  01/16/2017 FINDINGS: The heart size and mediastinal contours are within normal limits. Mixed interstitial and airspace disease is again seen bilaterally, showing mild interval worsening in the right upper and lower lung fields. No evidence of pneumothorax or pleural effusion. IMPRESSION: Diffuse mixed interstitial and airspace disease, with mild interval worsening in the right lung. Electronically Signed   By: Earle Gell M.D.   On: 01/17/2017 08:46    Dg Chest Port 1 View  Result Date: 01/16/2017 CLINICAL DATA:  83 year old female recently admitted with pneumonia and sepsis, acute respiratory failure today EXAM: PORTABLE CHEST 1 VIEW COMPARISON:  Prior chest x-ray 01/15/2017 FINDINGS: Cardiomegaly is unchanged. Atherosclerotic calcifications again noted in the transverse aorta. Diffuse interstitial prominence bilaterally, slightly improved compared to prior. Areas of patchy interstitial and airspace opacity are present within the right upper and left mid lung. Overall, inspiratory volumes and aeration are improved. IMPRESSION: 1. Overall, inspiratory volumes and pulmonary aeration are improved compared to yesterday likely secondary to decreased interstitial edema. 2. Patchy airspace opacities in the right upper and left mid lung may reflect multifocal pneumonia. 3.  Aortic Atherosclerosis (ICD10-170.0) 4. Background of chronic bronchitic change and interstitial prominence. Electronically Signed   By: Jacqulynn Cadet M.D.   On: 01/16/2017 10:14   Dg Chest Portable 1 View  Result Date: 01/15/2017 CLINICAL DATA:  Fever and increasing shortness of breath.  Hypoxia. EXAM: PORTABLE CHEST 1 VIEW COMPARISON:  07/22/2016, 05/18/2016, 08/29/2015 and 05/16/2015 as well as chest CT dated 07/22/2015 FINDINGS: The patient has diffuse infiltrates throughout the right lung inter lesser degree in the left lung base. Heart size is normal. Slight pulmonary vascular prominence. No discrete effusions. Extensive calcification in the thoracic aorta. IMPRESSION: Extensive bilateral pulmonary infiltrates, right worse than left. Aortic atherosclerosis. Electronically Signed   By: Lorriane Shire M.D.   On: 01/15/2017 15:03     ASSESSMENT AND PLAN:   84 year old female history of diabetes, vascular dementia, peripheral vascular disease, hypothyroidism, atrial fibrillation, anxiety/depression, COPD who presented to the hospital due to fever, leukocytosis and suspected have  sepsis secondary to pneumonia/cellulitis.  1. Sepsis-patient met criteria given leukocytosis, fever. Chest x-ray findings suggestive of bilateral infiltrates. -Blood cultures were positive for MSSA and was on IV Ancef but switched over to IV Zosyn again. -Echo (-) for vegetations with normal EF.  Discussed plan of care with infectious disease at [facility].  2. Pneumonia-source of patient's sepsis. Likely staph pneumonia given patient's bacteremia. - was on IV ancef and now on IV Zosyn. Echo (-) for endocarditis with no vegetation and normal  EF.  - Hypoxic again this a.m. And placed back on Bipap.    3. Anemia of chronic disease - Hg. Improved post transfusion and will cont. To monitor.  - no acute bleeding noted.  4. Hypothyroidism-continue Synthroid.  5. History of atrial fibrillation-rate controlled. Continue amiodarone.  6. COPD-no acute exacerbation-continue Spiriva, Dulera.  7. GERD-continue Protonix.  8. Depression-continue Effexor.  9. Dementia - cont. Aricept.   All the records are reviewed and case discussed with Care Management/Social Worker. Management plans discussed with the patient, family and they are in agreement.  CODE STATUS: Full code  DVT Prophylaxis: Full code  TOTAL TIME TAKING CARE OF THIS PATIENT: 30 minutes.   POSSIBLE D/C IN 2-3 DAYS, DEPENDING ON CLINICAL CONDITION.   Henreitta Leber M.D on 01/17/2017 at 12:10 PM  Between 7am to 6pm - Pager - [phone]  After 6pm go to [email or web address] - [employer] [facility]  Office  [phone]  CC: Primary care physician; Valerie Roys, DO

## 2017-01-17 NOTE — Progress Notes (Signed)
Pharmacy Antibiotic Note  Theresa Vasquez is a 83 y.o. female admitted on 01/15/2017 with pneumonia.  Pharmacy has been consulted for piperacillin/tazobactam dosing.  Plan: Piperacillin/tazobactam 3.375 g IV q8h EI  Height: 5\' 7"  (170.2 cm) Weight: 199 lb 15.3 oz (90.7 kg) IBW/kg (Calculated) : 61.6  Temp (24hrs), Avg:99.1 F (37.3 C), Min:98.2 F (36.8 C), Max:100.7 F (38.2 C)   Recent Labs Lab 01/15/17 1339 01/15/17 1346 01/15/17 1650 01/16/17 0333 01/17/17 0531  WBC 17.1*  --   --  10.0 8.7  CREATININE 0.81  --   --  0.70 0.72  LATICACIDVEN  --  2.4* 2.6*  --   --     Estimated Creatinine Clearance: 60.5 mL/min (by C-G formula based on SCr of 0.72 mg/dL).    No Known Allergies  Antimicrobials this admission: Piperacillin/tazobactam 7/1 >>  cefazolin 6/29 >> 7/1  Dose adjustments this admission:  Microbiology results: 6/29 BCx: MSSA  Thank you for allowing pharmacy to be a part of this patient's care.  Lenis Noon, PharmD Clinical Pharmacist 01/17/2017 11:51 AM

## 2017-01-17 NOTE — Progress Notes (Signed)
ANTIBIOTIC CONSULT NOTE - INITIAL  Pharmacy Consult for Cefazolin Indication: bacteremia  No Known Allergies  Patient Measurements: Height: 5\' 7"  (170.2 cm) Weight: 199 lb 15.3 oz (90.7 kg) IBW/kg (Calculated) : 61.6 Adjusted Body Weight:   Vital Signs: Temp: 98.6 F (37 C) (07/01 0815) Temp Source: Axillary (07/01 0815) BP: 182/83 (07/01 0815) Pulse Rate: 106 (07/01 0815) Intake/Output from previous day: 06/30 0701 - 07/01 0700 In: 2321 [P.O.:1810; I.V.:1; Blood:310; IV Piggyback:200] Out: 1350 [Urine:1350] Intake/Output from this shift: Total I/O In: -  Out: 230 [Urine:230]  Labs:  Recent Labs  01/15/17 1339 01/16/17 0333 01/16/17 0909 01/16/17 2015 01/17/17 0531  WBC 17.1* 10.0  --   --  8.7  HGB 7.8* 6.5* 6.9* 7.7* 8.0*  PLT 412 336  --   --  331  CREATININE 0.81 0.70  --   --  0.72   Estimated Creatinine Clearance: 60.5 mL/min (by C-G formula based on SCr of 0.72 mg/dL). No results for input(s): VANCOTROUGH, VANCOPEAK, VANCORANDOM, GENTTROUGH, GENTPEAK, GENTRANDOM, TOBRATROUGH, TOBRAPEAK, TOBRARND, AMIKACINPEAK, AMIKACINTROU, AMIKACIN in the last 72 hours.   Microbiology: Recent Results (from the past 720 hour(s))  Blood Culture (routine x 2)     Status: Abnormal (Preliminary result)   Collection Time: 01/15/17  1:39 PM  Result Value Ref Range Status   Specimen Description BLOOD BLOOD RIGHT FOREARM  Final   Special Requests   Final    BOTTLES DRAWN AEROBIC AND ANAEROBIC Blood Culture results may not be optimal due to an excessive volume of blood received in culture bottles   Culture  Setup Time   Final    GRAM POSITIVE COCCI IN BOTH AEROBIC AND ANAEROBIC BOTTLES CRITICAL RESULT CALLED TO, READ BACK BY AND VERIFIED WITHTheodis Shove @ 2694 01/16/17 by [facility]    Culture (A)  Final    STAPHYLOCOCCUS AUREUS SUSCEPTIBILITIES TO FOLLOW Performed at [facility], [place] [address], [place], [place] [address]    Report Status PENDING  Incomplete  Blood  Culture (routine x 2)     Status: None (Preliminary result)   Collection Time: 01/15/17  1:39 PM  Result Value Ref Range Status   Specimen Description BLOOD BLOOD LEFT FOREARM  Final   Special Requests   Final    BOTTLES DRAWN AEROBIC AND ANAEROBIC Blood Culture results may not be optimal due to an excessive volume of blood received in culture bottles   Culture NO GROWTH 2 DAYS  Final   Report Status PENDING  Incomplete  Blood Culture ID Panel (Reflexed)     Status: Abnormal   Collection Time: 01/15/17  1:39 PM  Result Value Ref Range Status   Enterococcus species NOT DETECTED NOT DETECTED Final   Listeria monocytogenes NOT DETECTED NOT DETECTED Final   Staphylococcus species DETECTED (A) NOT DETECTED Final    Comment: CRITICAL RESULT CALLED TO, READ BACK BY AND VERIFIED WITHTheodis Shove @ 7035 01/16/17 by [facility]    Staphylococcus aureus DETECTED (A) NOT DETECTED Final    Comment: Methicillin (oxacillin) susceptible Staphylococcus aureus (MSSA). Preferred therapy is anti staphylococcal beta lactam antibiotic (Cefazolin or Nafcillin), unless clinically contraindicated. CRITICAL RESULT CALLED TO, READ BACK BY AND VERIFIED WITH: Theodis Shove @ 0093 01/16/17 by [facility]    Methicillin resistance NOT DETECTED NOT DETECTED Final   Streptococcus species NOT DETECTED NOT DETECTED Final   Streptococcus agalactiae NOT DETECTED NOT DETECTED Final   Streptococcus pneumoniae NOT DETECTED NOT DETECTED Final   Streptococcus pyogenes NOT DETECTED NOT DETECTED  Final   Acinetobacter baumannii NOT DETECTED NOT DETECTED Final   Enterobacteriaceae species NOT DETECTED NOT DETECTED Final   Enterobacter cloacae complex NOT DETECTED NOT DETECTED Final   Escherichia coli NOT DETECTED NOT DETECTED Final   Klebsiella oxytoca NOT DETECTED NOT DETECTED Final   Klebsiella pneumoniae NOT DETECTED NOT DETECTED Final   Proteus species NOT DETECTED NOT DETECTED Final   Serratia marcescens NOT DETECTED NOT DETECTED Final    Haemophilus influenzae NOT DETECTED NOT DETECTED Final   Neisseria meningitidis NOT DETECTED NOT DETECTED Final   Pseudomonas aeruginosa NOT DETECTED NOT DETECTED Final   Candida albicans NOT DETECTED NOT DETECTED Final   Candida glabrata NOT DETECTED NOT DETECTED Final   Candida krusei NOT DETECTED NOT DETECTED Final   Candida parapsilosis NOT DETECTED NOT DETECTED Final   Candida tropicalis NOT DETECTED NOT DETECTED Final  MRSA PCR Screening     Status: None   Collection Time: 01/16/17  7:32 AM  Result Value Ref Range Status   MRSA by PCR NEGATIVE NEGATIVE Final    Comment:        The GeneXpert MRSA Assay (FDA approved for NASAL specimens only), is one component of a comprehensive MRSA colonization surveillance program. It is not intended to diagnose MRSA infection nor to guide or monitor treatment for MRSA infections.   Culture, blood (Routine X 2) w Reflex to ID Panel     Status: None (Preliminary result)   Collection Time: 01/16/17  9:04 AM  Result Value Ref Range Status   Specimen Description BLOOD LEFT ANTECUBITAL  Final   Special Requests   Final    BOTTLES DRAWN AEROBIC AND ANAEROBIC Blood Culture adequate volume   Culture NO GROWTH < 24 HOURS  Final   Report Status PENDING  Incomplete  Culture, blood (Routine X 2) w Reflex to ID Panel     Status: None (Preliminary result)   Collection Time: 01/16/17  9:04 AM  Result Value Ref Range Status   Specimen Description BLOOD RIGHT ANTECUBITAL  Final   Special Requests   Final    BOTTLES DRAWN AEROBIC AND ANAEROBIC Blood Culture results may not be optimal due to an excessive volume of blood received in culture bottles   Culture NO GROWTH < 24 HOURS  Final   Report Status PENDING  Incomplete  Aerobic Culture (superficial specimen)     Status: None (Preliminary result)   Collection Time: 01/16/17 11:11 AM  Result Value Ref Range Status   Specimen Description WOUND ANKLE  Final   Special Requests NONE  Final   Gram Stain    Final    FEW WBC PRESENT, PREDOMINANTLY PMN ABUNDANT GRAM POSITIVE COCCI IN PAIRS FEW GRAM POSITIVE RODS Performed at [facility], [address], [place], [place] [address]    Culture PENDING  Incomplete   Report Status PENDING  Incomplete    Medical History: Past Medical History:  Diagnosis Date  . Adenomatous polyps   . Age-related osteoporosis without current pathological fracture   . Anemia   . Anxiety   . Atherosclerosis of aorta ([place])   . Atrial fibrillation with RVR ([place])   . Barrett's esophagus without dysplasia   . CHF (congestive heart failure) ([place])   . Colitis   . COPD (chronic obstructive pulmonary disease) ([place])   . Dementia   . Depression   . Diabetes mellitus type II, uncontrolled ([place])   . Diverticulitis   . Diverticulosis of intestine without perforation or abscess without bleeding   .  Edema   . Essential hemorrhagic thrombocythemia ([place])   . Gastrointestinal hemorrhage   . High anion gap metabolic acidosis   . Hyperlipidemia   . Hypertension   . Hypertensive chronic kidney disease with stage 1 through stage 4 chronic kidney disease, or unspecified chronic kidney disease   . Hypothyroidism   . Hypoxemia   . Iron deficiency anemia   . Lactic acidosis   . Left bundle branch block   . Nonrheumatic mitral valve insufficiency   . Osteoarthritis   . Other psychoactive substance dependence, in remission ([place])   . Other specified symptoms and signs involving the circulatory and respiratory systems   . Paroxysmal atrial fibrillation (HCC)    s/p cardioversion  . Personal history of peptic ulcer disease   . Pulmonary edema   . PVD (peripheral vascular disease) ([place]) 05/14/2015  . Recurrent pneumonia    history of  . Respiratory failure with hypoxia and hypercapnia ([place])   . Type 2 diabetes mellitus with diabetic chronic kidney disease ([place])   . Type 2 diabetes mellitus with diabetic polyneuropathy ([place])   . Vascular dementia without behavioral  disturbance   . Vascular disorder of intestine (HCC)     Medications:  Prescriptions Prior to Admission  Medication Sig Dispense Refill Last Dose  . ADVAIR DISKUS 250-50 MCG/DOSE AEPB INHALE ONE PUFF TWICE DAILY 180 each 1 01/15/2017 at Unknown time  . albuterol (PROVENTIL) (2.5 MG/3ML) 0.083% nebulizer solution Take 3 mLs (2.5 mg total) by nebulization every 4 (four) hours as needed for wheezing or shortness of breath. 75 mL 6 prn at prn  . amiodarone (PACERONE) 100 MG tablet Take 100 mg by mouth daily.    01/15/2017 at Unknown time  . amLODipine (NORVASC) 5 MG tablet TAKE ONE TABLET TWICE DAILY 180 tablet 1 01/15/2017 at Unknown time  . aspirin EC 81 MG tablet Take 81 mg by mouth daily.    01/15/2017 at Unknown time  . Cholecalciferol (VITAMIN D-3) 1000 units CAPS Take 1 capsule (1,000 Units total) by mouth daily. 90 capsule 3 01/15/2017 at Unknown time  . divalproex (DEPAKOTE) 250 MG DR tablet TAKE ONE TABLET BY MOUTH 3 TIMES DAILY 270 tablet 1 01/15/2017 at Unknown time  . donepezil (ARICEPT) 10 MG tablet Take 1 tablet (10 mg total) by mouth at bedtime. 90 tablet 1 01/14/2017 at Unknown time  . ferrous sulfate 325 (65 FE) MG tablet TAKE ONE TABLET BY MOUTH TWICE DAILY WITH A MEAL 180 tablet 3 01/15/2017 at Unknown time  . furosemide (LASIX) 40 MG tablet TAKE ONE TABLET TWICE DAILY 180 tablet 1 01/15/2017 at Unknown time  . gabapentin (NEURONTIN) 400 MG capsule Take 1 capsule (400 mg total) by mouth 2 (two) times daily. 180 capsule 1 01/15/2017 at Unknown time  . levothyroxine (SYNTHROID, LEVOTHROID) 150 MCG tablet TAKE ONE (1) TABLET BY MOUTH EVERY DAY BEFORE BREAKFAST 90 tablet 3 01/15/2017 at Unknown time  . lisinopril (PRINIVIL,ZESTRIL) 20 MG tablet TAKE ONE (1) TABLET EACH DAY 90 tablet 1 01/15/2017 at Unknown time  . Melatonin 3 MG TABS Take 1 tablet (3 mg total) by mouth at bedtime. 1 tablet 12 01/14/2017 at Unknown time  . metoprolol tartrate (LOPRESSOR) 25 MG tablet Take 1 tablet by mouth every  12 (twelve) hours.   01/15/2017 at Unknown time  . pantoprazole (PROTONIX) 40 MG tablet Take 40 mg by mouth daily.   01/15/2017 at Unknown time  . QUEtiapine (SEROQUEL) 25 MG tablet Take 1 tablet (25 mg total)  by mouth at bedtime. 90 tablet 1 01/14/2017 at Unknown time  . senna-docusate (SENOKOT-S) 8.6-50 MG tablet Take 1 tablet by mouth at bedtime as needed for mild constipation.   01/14/2017 at Unknown time  . SPIRIVA HANDIHALER 18 MCG inhalation capsule INHALE CONTENTS OF ONE CAPSULE ONCE A DAY AS DIRECTED 90 capsule 1 01/15/2017 at Unknown time  . venlafaxine XR (EFFEXOR-XR) 37.5 MG 24 hr capsule TAKE 1 CAPSULE BY MOUTH EVERY DAY WITH BREAKFAST 90 capsule 1 01/15/2017 at Unknown time  . acetaminophen (TYLENOL) 500 MG tablet Take 1 tablet (500 mg total) by mouth every 4 (four) hours as needed for mild pain, fever or headache. 30 tablet 0 prn at prn  . MICROLET LANCETS MISC TEST BLOOD GLUCOSE LEVEL ONCE DAILY 100 each 12 Taking   Scheduled:  . amiodarone  100 mg Oral Daily  . aspirin EC  81 mg Oral Daily  . chlorhexidine  15 mL Mouth Rinse BID  . divalproex  250 mg Oral Q8H  . donepezil  10 mg Oral QHS  . etomidate  0.3 mg/kg Intravenous Once  . fentaNYL (SUBLIMAZE) injection  100 mcg Intravenous Once  . ferrous sulfate  325 mg Oral BID WC  . gabapentin  400 mg Oral BID  . heparin  5,000 Units Subcutaneous Q8H  . levothyroxine  150 mcg Oral QAC breakfast  . mouth rinse  15 mL Mouth Rinse q12n4p  . midazolam  4 mg Intravenous Once  . mometasone-formoterol  2 puff Inhalation BID  . pantoprazole  40 mg Oral Daily  . QUEtiapine  25 mg Oral QHS  . tiotropium  18 mcg Inhalation Daily  . venlafaxine XR  37.5 mg Oral Q breakfast   Assessment: Pharmacy consulted to dose and monitor Cefazolin in this 83 year old female with staph aureus bacteremia   Goal of Therapy:    Plan:  Will continue Cefazolin 2g IV q8 hours    Gevork Ayyad D 01/17/2017,11:01 AM

## 2017-01-17 NOTE — Progress Notes (Signed)
   01/17/17 0815  Step #1 Pre-Swallow Screen  History of dysphagia No  Dysphagia diet prior to admission No  Awake and alert, responding to speech? Yes  Positioned upright, with some head control? Yes  Cough on command? Yes  Maintain control of their saliva? Yes  Lick top and bottom lip? Yes  Breathe freely and maintain O2 sats? Yes  Voice quality clear (No "wet" gurgly or hoarse sounds) Yes  R Upper  Breath Sounds Expiratory wheezes;Diminished  L Upper Breath Sounds Expiratory wheezes;Diminished  R Lower Breath Sounds Diminished  L Lower Breath Sounds Diminished  Temp 98.6 F (37 C)  Step #2 Swallow Screen  Patient was kept strictly NPO prior to this screen No  Water via cup No problems  Water via straw No problems  Cracker No problems  Lung sounds changed after swallow screen No  Passed swallow screen Yes, see row information

## 2017-01-17 NOTE — Progress Notes (Signed)
Pnt remains on 4L [place] and sats 93% or greater. Pnt alert this evening and very talkative. In good spirits and laughing. H/H recheck post PRBC transfusion 7.4/24.4. Pnt turned q2 hours with pillow support for back, hips and between legs. Pnt is resting with eyes closed but not asleep, pnt easily awakens when entering room. Pnt denies any pain, discomfort or SOB. Resp tech has been called to place CPAP. No other issues or concerns at this time. Bed alarm on, bed locked, bed rails up x3 and call bell in reach. Will continue to monitor and assess.

## 2017-01-17 NOTE — Consult Note (Signed)
Theresa Vasquez Nurse wound consult note Reason for Consult: Healing full thickness pressure injury to the left posterior heel.  (Initialyl Stage 3 or 4, patient has been receiving daily wound care by [facility] or family for 56 months and wound is now nearly healed). Wound type:Pressure Pressure Injury POA: Yes Measurement:0.8cm x 2cm x 0.2cm Wound bed: pink, moist Drainage (amount, consistency, odor) small amount of serous to light yellow exudate Periwound:Macerated Dressing procedure/placement/frequency: I will discontinue the silicone foam dressing as it is contributing to maceration/excess moisture and overhydration of tissues. Will implement twice daily xeroform gauze topped with dry gauze and secured with a few turns of the kerlix roll gauze/paper tape.  Patient to continue use of the bilateral pressure redistribution heel boots. Patient and family are in agreement with POC. [place] nursing team will not follow, but will remain available to this patient, the nursing and medical teams.  Please re-consult if needed. Thanks, [name], MSN, RN, [facility], [name]  Pager# [phone]

## 2017-01-17 NOTE — Progress Notes (Signed)
      INFECTIOUS DISEASE ATTENDING ADDENDUM:   Date: 01/17/2017  Patient name: Theresa Vasquez  Medical record number: [id number]  Date of birth: January 16, 1931         [facility] Antimicrobial Management Team Staphylococcus aureus bacteremia   Staphylococcus aureus bacteremia (SAB) is associated with a high rate of complications and mortality.  Specific aspects of clinical management are critical to optimizing the outcome of patients with SAB.  Therefore, the [facility] Antimicrobial Management Team [facility]) has initiated an intervention aimed at improving the management of SAB at [facility].  To do so, Infectious Diseases physicians are providing an evidence-based consult for the management of all patients with SAB.     Yes No Comments  Perform follow-up blood cultures (even if the patient is afebrile) to ensure clearance of bacteremia [x]  []  Repeat blood cultures ordered by yesterday  Remove vascular catheter and obtain follow-up blood cultures after the removal of the catheter [x]  []  , PLEASE AVOID Placing one x 5 days to ensure clearance  Perform echocardiography to evaluate for endocarditis (transthoracic ECHO is 40-50% sensitive, TEE is > 90% sensitive) [x]  []  Please keep in mind, that neither test can definitively EXCLUDE endocarditis, and that should clinical suspicion remain high for endocarditis the patient should then still be treated with an "endocarditis" duration of therapy = 6 weeks  TTE didn't show endocarditis  Consult electrophysiologist to evaluate implanted cardiac device (pacemaker, ICD) []  []    Ensure source control [x]  []  Have all abscesses been drained effectively? Have deep seeded infections (septic joints or osteomyelitis) had appropriate surgical debridement?  Lungs and leg are mentioned. Leg did not warrant imaging per Dr. Verdell Carmine assessment    Investigate for "metastatic" sites of infection [x]  []  Does the patient have ANY symptom or physical exam finding  that would suggest a deeper infection (back or neck pain that may be suggestive of vertebral osteomyelitis or epidural abscess, muscle pain that could be a symptom of pyomyositis)?  Keep in mind that for deep seeded infections MRI imaging with contrast is preferred rather than other often insensitive tests such as plain x-rays, especially early in a patient's presentation.  Change antibiotic therapy to IV cefazolin [x]  []  Beta-lactam antibiotics are preferred for MSSA due to higher cure rates.   If on Vancomycin, goal trough should be 15 - 20 mcg/mL  Estimated duration of IV antibiotic therapy:  4-6 weeks [x]  []  Consult case management for probably prolonged outpatient IV antibiotic therapy   Today Dr. Lyndel Safe changed the pt back from ancef to zosyn to cover for HCAP organisms.  However I think that MSSA in the blood and lungs is sufficient to explain her illness including her severe PNA and this is also in the context of 83 yo w COPD  MOrtality rate for MSSAB is 81% for all comers  I have reviewed with Dr. Verdell Carmine and Dr. Tamala Julian from ICU E link team who agreed with plan to de-escalate back to ancef.   Dr. Ola Spurr to see the patient tomorrow.  Dr. Verdell Carmine to discuss  My pager is [place] 01/17/2017, 6:30 PM

## 2017-01-17 NOTE — Progress Notes (Signed)
[facility] Progess Note     ASSESSMENT/PLAN   ASSESSMENT : ACUTE HYPOXIC RESP FAILURE FLUID OVERLOAD HFpEF / AFIB SEPSIS 2 TO STAPH BACTEREMIA LACTIC ACIDOSIS- improved ANEMIA  PLAN BIPAP as needed for sats >89%. IV lasix bid. On BD. On amiodarone. Zosyn to cover HCA organisms.   Full Code. Son updated at bedside.  Dimas Chyle MD PCCM    SUBJECTIVE:  Patient seen and examined at bedside. Noted events overnight. Given 1 unit of packed cell yesterday. Placed on BIPAP this morning for hypoxia. Given lasix with good diuretic response. Denies any chest pain. Feels breathing improving.   VITAL SIGNS: Temp:  [98.2 F (36.8 C)-100.7 F (38.2 C)] 98.6 F (37 C) (07/01 0815) Pulse Rate:  [77-108] 92 (07/01 1100) Resp:  [14-33] 18 (07/01 1100) BP: (96-182)/(39-83) 122/53 (07/01 1100) SpO2:  [73 %-100 %] 99 % (07/01 1100) FiO2 (%):  [50 %] 50 % (07/01 1100)     PHYSICAL EXAMINATION: Physical Examination:   VS: BP (!) 122/53   Pulse 92   Temp 98.6 F (37 C) (Axillary)   Resp 18   Ht 5\' 7"  (1.702 m)   Wt 199 lb 15.3 oz (90.7 kg)   SpO2 99%   BMI 31.32 kg/m    General Appearance: mildly tachypniec but not using accessory muscles Neuro:  Aox3, non-focal HEENT:  [place], jvd+ Cardiovascular:  S1s2+, regular Lungs:  Decreased air entry b/l ,crackles + Abdomen:  Soft, non-tender, bs+ Skin: leg erythema +    LABORATORY PANEL:   CBC  Recent Labs Lab 01/17/17 0531  WBC 8.7  HGB 8.0*  HCT 24.9*  PLT 331    Chemistries   Recent Labs Lab 01/15/17 1339  01/17/17 0531  NA 137  < > 138  K 4.8  < > 3.7  CL 100*  < > 102  CO2 25  < > 31  GLUCOSE 176*  < > 137*  BUN 16  < > 12  CREATININE 0.81  < > 0.72  CALCIUM 8.2*  < > 8.0*  AST 45*  --   --   ALT 17  --   --   ALKPHOS 48  --   --   BILITOT 0.7  --   --   < > = values in this interval not displayed.   Recent Labs Lab 01/16/17 0634  GLUCAP 207*    Recent Labs Lab  01/16/17 0830 01/17/17 0915  PHART 7.43 7.42  PCO2ART 46 48  PO2ART 70* 91    Recent Labs Lab 01/15/17 1339  AST 45*  ALT 17  ALKPHOS 48  BILITOT 0.7  ALBUMIN 3.1*    Cardiac Enzymes No results for input(s): TROPONINI in the last 168 hours.  RADIOLOGY:  US Venous Img Lower Unilateral Right IMPRESSION: No evidence of DVT within the right lower extremity. Electronically Signed   By: Lajean Manes M.D.   On: 01/16/2017 12:21   Dg Chest Port 1 View  Result Date: 01/17/2017 IMPRESSION: Diffuse mixed interstitial and airspace disease, with mild interval worsening in the right lung.    Medications: . amiodarone  100 mg Oral Daily  . aspirin EC  81 mg Oral Daily  . chlorhexidine  15 mL Mouth Rinse BID  . divalproex  250 mg Oral Q8H  . donepezil  10 mg Oral QHS  . etomidate  0.3 mg/kg Intravenous Once  . fentaNYL (SUBLIMAZE) injection  100 mcg Intravenous Once  . ferrous sulfate  325  mg Oral BID WC  . furosemide  20 mg Intravenous BID  . gabapentin  400 mg Oral BID  . heparin  5,000 Units Subcutaneous Q8H  . levothyroxine  150 mcg Oral QAC breakfast  . mouth rinse  15 mL Mouth Rinse q12n4p  . midazolam  4 mg Intravenous Once  . mometasone-formoterol  2 puff Inhalation BID  . pantoprazole  40 mg Oral Daily  . QUEtiapine  25 mg Oral QHS  . tiotropium  18 mcg Inhalation Daily  . venlafaxine XR  37.5 mg Oral Q breakfast   acetaminophen, albuterol, ALPRAZolam, docusate sodium, morphine CONCENTRATE, senna-docusate Allergies: Patient has no known allergies.       Dimas Chyle MD   01/17/2017

## 2017-01-17 NOTE — Plan of Care (Signed)
  Intake/Output Summary (Last 24 hours) at 01/17/17 1814 Last data filed at 01/17/17 1753  Gross per 24 hour  Intake             1650 ml  Output             1380 ml  Net              270 ml

## 2017-01-18 ENCOUNTER — Inpatient Hospital Stay: Payer: Medicare Other

## 2017-01-18 DIAGNOSIS — J9601 Acute respiratory failure with hypoxia: Secondary | ICD-10-CM

## 2017-01-18 DIAGNOSIS — R7881 Bacteremia: Secondary | ICD-10-CM

## 2017-01-18 DIAGNOSIS — J9621 Acute and chronic respiratory failure with hypoxia: Secondary | ICD-10-CM

## 2017-01-18 LAB — CBC
HCT: 22.6 % — ABNORMAL LOW (ref 35.0–47.0)
Hemoglobin: 7.3 g/dL — ABNORMAL LOW (ref 12.0–16.0)
MCH: 29.1 pg (ref 26.0–34.0)
MCHC: 32.2 g/dL (ref 32.0–36.0)
MCV: 90.6 fL (ref 80.0–100.0)
PLATELETS: 324 10*3/uL (ref 150–440)
RBC: 2.5 MIL/uL — AB (ref 3.80–5.20)
RDW: 16.5 % — ABNORMAL HIGH (ref 11.5–14.5)
WBC: 7.2 10*3/uL (ref 3.6–11.0)

## 2017-01-18 LAB — BASIC METABOLIC PANEL
Anion gap: 7 (ref 5–15)
BUN: 12 mg/dL (ref 6–20)
CO2: 31 mmol/L (ref 22–32)
CREATININE: 0.67 mg/dL (ref 0.44–1.00)
Calcium: 7.7 mg/dL — ABNORMAL LOW (ref 8.9–10.3)
Chloride: 100 mmol/L — ABNORMAL LOW (ref 101–111)
GFR calc Af Amer: >60 mL/min (ref >60)
Glucose, Bld: 176 mg/dL — ABNORMAL HIGH (ref 65–99)
Potassium: 3.6 mmol/L (ref 3.5–5.1)
SODIUM: 138 mmol/L (ref 135–145)

## 2017-01-18 LAB — GLUCOSE, CAPILLARY
GLUCOSE-CAPILLARY: 147 mg/dL — AB (ref 65–99)
GLUCOSE-CAPILLARY: 133 mg/dL — AB (ref 65–99)
GLUCOSE-CAPILLARY: 156 mg/dL — AB (ref 65–99)
GLUCOSE-CAPILLARY: 170 mg/dL — AB (ref 65–99)

## 2017-01-18 LAB — AEROBIC CULTURE W GRAM STAIN (SUPERFICIAL SPECIMEN)

## 2017-01-18 LAB — CULTURE, BLOOD (ROUTINE X 2)

## 2017-01-18 LAB — AEROBIC CULTURE  (SUPERFICIAL SPECIMEN)

## 2017-01-18 MED ORDER — PANTOPRAZOLE SODIUM 40 MG PO TBEC
40.0000 mg | DELAYED_RELEASE_TABLET | Freq: Two times a day (BID) | ORAL | Status: DC
  Administered 2017-01-18 – 2017-01-19 (×2): 40 mg via ORAL
  Filled 2017-01-18 (×2): qty 1

## 2017-01-18 MED ORDER — CEFAZOLIN SODIUM-DEXTROSE 2-4 GM/100ML-% IV SOLN
2.0000 g | Freq: Three times a day (TID) | INTRAVENOUS | Status: DC
  Administered 2017-01-18 – 2017-01-19 (×4): 2 g via INTRAVENOUS
  Filled 2017-01-18 (×6): qty 100

## 2017-01-18 MED ORDER — CEFAZOLIN (ANCEF) 1 G IV SOLR
1.0000 g | Freq: Once | INTRAVENOUS | Status: DC
  Filled 2017-01-18: qty 1

## 2017-01-18 MED ORDER — CEFAZOLIN SODIUM-DEXTROSE 1-4 GM/50ML-% IV SOLN
1.0000 g | Freq: Once | INTRAVENOUS | Status: AC
  Administered 2017-01-18: 1 g via INTRAVENOUS
  Filled 2017-01-18: qty 50

## 2017-01-18 MED ORDER — FUROSEMIDE 40 MG PO TABS
40.0000 mg | ORAL_TABLET | Freq: Every day | ORAL | Status: DC
  Administered 2017-01-19: 40 mg via ORAL
  Filled 2017-01-18: qty 1

## 2017-01-18 MED ORDER — INSULIN ASPART 100 UNIT/ML ~~LOC~~ SOLN: 0.0000 [IU] | Freq: Every day | SUBCUTANEOUS | Status: DC

## 2017-01-18 MED ORDER — INSULIN ASPART 100 UNIT/ML ~~LOC~~ SOLN
0.0000 [IU] | Freq: Three times a day (TID) | SUBCUTANEOUS | Status: DC
  Administered 2017-01-18: 2 [IU] via SUBCUTANEOUS
  Administered 2017-01-18: 3 [IU] via SUBCUTANEOUS
  Administered 2017-01-19 (×2): 2 [IU] via SUBCUTANEOUS
  Administered 2017-01-19: 5 [IU] via SUBCUTANEOUS
  Filled 2017-01-18 (×5): qty 1

## 2017-01-18 NOTE — Evaluation (Signed)
Physical Therapy Evaluation Patient Details Name: Theresa Vasquez MRN: [id number] DOB: 05-16-1931 Today's Date: 01/18/2017   History of Present Illness  presented to ER secondary to fever, hypoxia; admitted with sepsis related to bilat PNA, R LE cellulitis.  Hospital course significant for transfer to CCU for BiPAP therapy, transferred to med-surg and weaned to 2-3L via [place] at this time.  Clinical Impression  Upon evaluation, patient alert and oriented to basic information; follows simple commands, though demonstrates limited ability to retain/recall new information, limited insight into deficits.  Bilat UE/LE strength and ROM globally weak and deconditioned, requiring assist from therapist for movement throughout full, available ROM.  Currently requiring mod/max assist for bed mobility, min assist for unsupported sitting balance (constantly lists posteriorally without self-correction).  Unable to complete full sit/stand due to significant LE weakness and poor trunk control/balance; unsafe to attempt further mobility/OOB efforts at this time. Notably fatigued with above efforts. Patient 89-91% on 2L, requiring increase to 3L to maintain >90% with exertion (per RN).  Left on 3L end of session for optimal oxygenation.  RN informed/aware. Would benefit from skilled PT to address above deficits and promote optimal return to PLOF; recommend transition to STR upon discharge from acute hospitalization.     Follow Up Recommendations SNF    Equipment Recommendations       Recommendations for Other Services       Precautions / Restrictions Precautions Precautions: Fall Restrictions Weight Bearing Restrictions: No      Mobility  Bed Mobility Overal bed mobility: Needs Assistance Bed Mobility: Supine to Sit;Sit to Supine     Supine to sit: Mod assist Sit to supine: Max assist   General bed mobility comments: dep assist to scoot up in bed  Transfers Overall transfer level: Needs  assistance Equipment used: Rolling walker (2 wheeled) Transfers: Sit to/from Stand Sit to Stand: Max assist         General transfer comment: completes partial lift off from bed surface only  (unable to fully clear buttocks); unable to achieve full sit/standing due to significant weakness and poor cardiorespiratory endurance  Ambulation/Gait             General Gait Details: unsafe/unable  Stairs            Wheelchair Mobility    Modified Rankin (Stroke Patients Only)       Balance Overall balance assessment: Needs assistance Sitting-balance support: No upper extremity supported;Feet supported Sitting balance-Leahy Scale: Fair Sitting balance - Comments: lists posteriorally with fatigue; min cuing for correction (no sponatneous attempts) Postural control: Posterior lean                                   Pertinent Vitals/Pain Pain Assessment: No/denies pain    Home Living Family/patient expects to be discharged to:: Private residence Living Arrangements: Non-relatives/Friends (24/7 caregiver) Available Help at Discharge: Personal care attendant;Available 24 hours/day Type of Home: House Home Access: Ramped entrance     Home Layout: One level Home Equipment: Walker - 4 wheels;Cane - single point;Hand held shower head;Grab bars - tub/shower;Shower seat;Bedside commode Additional Comments: Social history/home environment obtained from prior documentation; patient unable to accurately recall/provide    Prior Function Level of Independence: Needs assistance   Gait / Transfers Assistance Needed: Per previous notes-Ambulatory limited household distances with rollator (occasionally requiring physical assist) prior to hip fx surgery end of November); nursing reports family reported pt was  walking up to 50 feet with walker this weekend.  ADL's / Homemaking Assistance Needed: 24 hour caregiver assist with ADLs, and IADLs.        Hand Dominance    Dominant Hand: Right    Extremity/Trunk Assessment   Upper Extremity Assessment Upper Extremity Assessment: Generalized weakness    Lower Extremity Assessment Lower Extremity Assessment: Generalized weakness (grossly 3-/5 throughout; wound (wrapped, non-visualized) to L heel)       Communication   Communication: HOH  Cognition Arousal/Alertness: Awake/alert Behavior During Therapy: WFL for tasks assessed/performed Overall Cognitive Status: No family/caregiver present to determine baseline cognitive functioning (oriented to self only; follows simple commmands, but demonstrates poor retention of new information, poor safety awareness/insight)                                        General Comments      Exercises Other Exercises Other Exercises: Activities to promote awareness of midlien orientation/static sitting balance and self-correction of posterior listing.  Patient able to correct with min cuing/assist from therapist, but unable to correct without assist.   Assessment/Plan    PT Assessment Patient needs continued PT services  PT Problem List Decreased strength;Decreased range of motion;Decreased activity tolerance;Decreased balance;Decreased mobility;Decreased coordination;Decreased cognition;Decreased knowledge of use of DME;Decreased safety awareness;Decreased knowledge of precautions;Cardiopulmonary status limiting activity;Decreased skin integrity;Obesity       PT Treatment Interventions DME instruction;Gait training;Functional mobility training;Therapeutic activities;Therapeutic exercise;Balance training;Patient/family education    PT Goals (Current goals can be found in the Care Plan section)  Acute Rehab PT Goals Patient Stated Goal: to try to get up and get off my back PT Goal Formulation: With patient Time For Goal Achievement: 02/01/17 Potential to Achieve Goals: Fair    Frequency Min 2X/week   Barriers to discharge Decreased caregiver  support      Co-evaluation               AM-PAC PT "6 Clicks" Daily Activity  Outcome Measure Difficulty turning over in bed (including adjusting bedclothes, sheets and blankets)?: Total Difficulty moving from lying on back to sitting on the side of the bed? : Total Difficulty sitting down on and standing up from a chair with arms (e.g., wheelchair, bedside commode, etc,.)?: Total Help needed moving to and from a bed to chair (including a wheelchair)?: Total Help needed walking in hospital room?: Total Help needed climbing 3-5 steps with a railing? : Total 6 Click Score: 6    End of Session Equipment Utilized During Treatment: Gait belt;Oxygen Activity Tolerance: Patient limited by fatigue Patient left: in bed;with call bell/phone within reach;with bed alarm set Nurse Communication: Mobility status PT Visit Diagnosis: Muscle weakness (generalized) (M62.81);Difficulty in walking, not elsewhere classified (R26.2)    Time: 1505-6979 PT Time Calculation (min) (ACUTE ONLY): 28 min   Charges:   PT Evaluation $PT Eval Low Complexity: 1 Procedure PT Treatments $Therapeutic Activity: 8-22 mins   PT G Codes:        Khristen Cheyney H. Owens Shark, PT, DPT, NCS 01/18/17, 3:13 PM [phone]

## 2017-01-18 NOTE — Plan of Care (Signed)
Pt's glasses missing, didn't see them 7/1 or 7/2 at bedside. Seems like per home aide she had it when she was in icu 9 Checked both rooms and none seen

## 2017-01-18 NOTE — Progress Notes (Signed)
No distress. Cognition intact. No new complaints  Vitals:   01/18/17 0800 01/18/17 0900 01/18/17 1000 01/18/17 1100  BP: (!) 123/45 (!) 137/54 (!) 31/17 123/79  Pulse: 77 94 82 82  Resp: 20 (!) 21 19 15   Temp:      TempSrc:      SpO2: 94% (!) 88% 93% 94%  Weight:      Height:         Gen: NAD HEENT: NCAT, sclerae white Neck: NO LAN, no JVD noted Lungs: full BS, few rhonchi on R Cardiovascular: Reg, soft systolic M Abdomen: Obese, soft, NT, +BS Ext: no C/C/Echornic stasis changes Neuro: grossly intact  BMP Latest Ref Rng & Units 01/18/2017 01/17/2017 01/17/2017  Glucose 65 - 99 mg/dL 176(H) 173(H) 137(H)  BUN 6 - 20 mg/dL 12 13 12   Creatinine 0.44 - 1.00 mg/dL 0.67 0.63 0.72  BUN/Creat Ratio 12 - 28 - - -  Sodium 135 - 145 mmol/L 138 136 138  Potassium 3.5 - 5.1 mmol/L 3.6 3.8 3.7  Chloride 101 - 111 mmol/L 100(L) 98(L) 102  CO2 22 - 32 mmol/L 31 31 31   Calcium 8.9 - 10.3 mg/dL 7.7(L) 7.8(L) 8.0(L)   CBC Latest Ref Rng & Units 01/18/2017 01/17/2017 01/16/2017  WBC 3.6 - 11.0 K/uL 7.2 8.7 -  Hemoglobin 12.0 - 16.0 g/dL 7.3(L) 8.0(L) 7.7(L)  Hematocrit 35.0 - 47.0 % 22.6(L) 24.9(L) 24.4(L)  Platelets 150 - 440 K/uL 324 331 -   CXR: R sided diffuse infiltrate, improved L sided infiltrate  IMPRESSION: Acute hypoxemic respiratory failure MRSA pneumonia and bacteremia Severe sepsis, resolved History of heart failure  The official CXR interpretation notwithstanding, I believe that the chest x-ray findings are most likely indicative of pneumonia more than pulmonary edema  PLAN/REC: Transfer to MedSurg Continue vancomycin Advance activity Continue her usual treatment for heart failure After transfer, PCCM will sign off. Please call if we can be of further assistance    Merton Border, MD PCCM service Mobile [phone] Pager [phone] 01/18/2017 11:50 AM

## 2017-01-18 NOTE — Consult Note (Signed)
[facility] Infectious Disease     Reason for Consult:MSSA bacteremia and PNA    Referring Physician: Bobetta Lime Date of Admission:  01/15/2017   Principal Problem:   Acute on chronic respiratory failure with hypoxia ([place]) Active Problems:   Pneumonia   Acute on chronic respiratory failure (HCC)   HPI: Theresa Vasquez is a 83 y.o. female admitted with fevers and hypoxia.  On admit noted to have temp 103,  impressive bil PNA and wbc 17. BCX + MSSA. She was in ICU for ARF but is now off bipap. She has hx dementia so hx is limited.   Past Medical History:  Diagnosis Date  . Adenomatous polyps   . Age-related osteoporosis without current pathological fracture   . Anemia   . Anxiety   . Atherosclerosis of aorta ([place])   . Atrial fibrillation with RVR ([place])   . Barrett's esophagus without dysplasia   . CHF (congestive heart failure) ([place])   . Colitis   . COPD (chronic obstructive pulmonary disease) ([place])   . Dementia   . Depression   . Diabetes mellitus type II, uncontrolled ([place])   . Diverticulitis   . Diverticulosis of intestine without perforation or abscess without bleeding   . Edema   . Essential hemorrhagic thrombocythemia ([place])   . Gastrointestinal hemorrhage   . High anion gap metabolic acidosis   . Hyperlipidemia   . Hypertension   . Hypertensive chronic kidney disease with stage 1 through stage 4 chronic kidney disease, or unspecified chronic kidney disease   . Hypothyroidism   . Hypoxemia   . Iron deficiency anemia   . Lactic acidosis   . Left bundle branch block   . Nonrheumatic mitral valve insufficiency   . Osteoarthritis   . Other psychoactive substance dependence, in remission ([place])   . Other specified symptoms and signs involving the circulatory and respiratory systems   . Paroxysmal atrial fibrillation (HCC)    s/p cardioversion  . Personal history of peptic ulcer disease   . Pulmonary edema   . PVD (peripheral vascular disease) ([place]) 05/14/2015  .  Recurrent pneumonia    history of  . Respiratory failure with hypoxia and hypercapnia ([place])   . Type 2 diabetes mellitus with diabetic chronic kidney disease ([place])   . Type 2 diabetes mellitus with diabetic polyneuropathy ([place])   . Vascular dementia without behavioral disturbance   . Vascular disorder of intestine [facility])    Past Surgical History:  Procedure Laterality Date  . ABDOMINAL HYSTERECTOMY     partial  . APPENDECTOMY    . COLONOSCOPY    . ESOPHAGOGASTRODUODENOSCOPY    . ESOPHAGOGASTRODUODENOSCOPY (EGD) WITH PROPOFOL N/A 07/04/2015   Procedure: ESOPHAGOGASTRODUODENOSCOPY (EGD) WITH PROPOFOL;  Surgeon: Manya Silvas, MD;  Location: [facility] ENDOSCOPY;  Service: Endoscopy;  Laterality: N/A;  . HIP SURGERY Left 06/2016   Rod placed   . INTRAMEDULLARY (IM) NAIL INTERTROCHANTERIC Left 06/18/2016   Procedure: INTRAMEDULLARY (IM) NAIL INTERTROCHANTRIC;  Surgeon: Dereck Leep, MD;  Location: [facility] ORS;  Service: Orthopedics;  Laterality: Left;  . SHOULDER ARTHROSCOPY    . SIGMOIDOSCOPY     Social History  Substance Use Topics  . Smoking status: Former Smoker    Packs/day: 1.00    Years: 60.00    Types: Cigarettes  . Smokeless tobacco: Never Used  . Alcohol use No   Family History  Problem Relation Age of Onset  . Diabetes Mother   . Diabetes Father     Allergies:  No Known Allergies  Current antibiotics: Antibiotics Given (last 72 hours)    Date/Time Action Medication Dose Rate   01/15/17 2244 New Bag/Given   ceFEPIme (MAXIPIME) 2 g in dextrose 5 % 50 mL IVPB 2 g 100 mL/hr   01/15/17 2348 New Bag/Given   vancomycin (VANCOCIN) 1,250 mg in sodium chloride 0.9 % 250 mL IVPB 1,250 mg 166.7 mL/hr   01/16/17 1005 New Bag/Given   ceFAZolin (ANCEF) IVPB 2g/100 mL premix 2 g 200 mL/hr   01/16/17 2255 New Bag/Given   ceFAZolin (ANCEF) IVPB 2g/100 mL premix 2 g 200 mL/hr   01/17/17 6256 New Bag/Given   ceFAZolin (ANCEF) IVPB 2g/100 mL premix 2 g 200 mL/hr   01/17/17 1355 New  Bag/Given   piperacillin-tazobactam (ZOSYN) IVPB 3.375 g 3.375 g 12.5 mL/hr   01/17/17 1941 New Bag/Given   ceFAZolin (ANCEF) IVPB 1 g/50 mL premix 1 g 100 mL/hr   01/18/17 0327 New Bag/Given   ceFAZolin (ANCEF) IVPB 1 g/50 mL premix 1 g 100 mL/hr   01/18/17 1255 New Bag/Given   ceFAZolin (ANCEF) IVPB 1 g/50 mL premix 1 g 100 mL/hr   01/18/17 1618 New Bag/Given   ceFAZolin (ANCEF) IVPB 1 g/50 mL premix 1 g 100 mL/hr      MEDICATIONS: . amiodarone  100 mg Oral Daily  . aspirin EC  81 mg Oral Daily  . chlorhexidine  15 mL Mouth Rinse BID  . divalproex  250 mg Oral Q8H  . donepezil  10 mg Oral QHS  . ferrous sulfate  325 mg Oral BID WC  . furosemide  40 mg Oral Daily  . gabapentin  400 mg Oral BID  . heparin  5,000 Units Subcutaneous Q8H  . insulin aspart  0-15 Units Subcutaneous TID WC  . insulin aspart  0-5 Units Subcutaneous QHS  . levothyroxine  150 mcg Oral QAC breakfast  . mouth rinse  15 mL Mouth Rinse q12n4p  . mometasone-formoterol  2 puff Inhalation BID  . pantoprazole  40 mg Oral BID  . QUEtiapine  25 mg Oral QHS  . tiotropium  18 mcg Inhalation Daily  . venlafaxine XR  37.5 mg Oral Q breakfast    Review of Systems - 11 systems reviewed and negative per HPI   OBJECTIVE: Temp:  [98.1 F (36.7 C)-98.8 F (37.1 C)] 98.8 F (37.1 C) (07/02 1220) Pulse Rate:  [66-96] 87 (07/02 1220) Resp:  [15-25] 16 (07/02 1220) BP: (31-154)/(17-81) 121/61 (07/02 1220) SpO2:  [85 %-100 %] 85 % (07/02 1504) FiO2 (%):  [3 %] 3 % (07/02 0700) Physical Exam  Constitutional:  Frail, on O2, awake and interactive HENT: [place]/AT, PERRLA, no scleral icterus Mouth/Throat: Oropharynx is clear and moist. No oropharyngeal exudate.  Cardiovascular: Normal rate, regular rhythm and normal heart sounds. 2/6 sm Pulmonary/Chest:bil rhonchi Neck = supple, no nuchal rigidity Abdominal: Soft. Bowel sounds are normal.  exhibits no distension. There is no tenderness.  Lymphadenopathy: no cervical  adenopathy. No axillary adenopathy Neurological:alert and interactive Skin: Skin is warm and dry.chronic bil LE stasis changes and dry skin.  Psychiatric: a normal mood and affect.  behavior is normal.    LABS: Results for orders placed or performed during the hospital encounter of 01/15/17 (from the past 48 hour(s))  Hemoglobin and hematocrit, blood     Status: Abnormal   Collection Time: 01/16/17  8:15 PM  Result Value Ref Range   Hemoglobin 7.7 (L) 12.0 - 16.0 g/dL   HCT 24.4 (L) 35.0 -  47.0 %  CBC     Status: Abnormal   Collection Time: 01/17/17  5:31 AM  Result Value Ref Range   WBC 8.7 3.6 - 11.0 K/uL   RBC 2.69 (L) 3.80 - 5.20 MIL/uL   Hemoglobin 8.0 (L) 12.0 - 16.0 g/dL   HCT 24.9 (L) 35.0 - 47.0 %   MCV 92.6 80.0 - 100.0 fL   MCH 29.7 26.0 - 34.0 pg   MCHC 32.1 32.0 - 36.0 g/dL   RDW 17.5 (H) 11.5 - 14.5 %   Platelets 331 150 - 440 K/uL  Basic metabolic panel     Status: Abnormal   Collection Time: 01/17/17  5:31 AM  Result Value Ref Range   Sodium 138 135 - 145 mmol/L   Potassium 3.7 3.5 - 5.1 mmol/L   Chloride 102 101 - 111 mmol/L   CO2 31 22 - 32 mmol/L   Glucose, Bld 137 (H) 65 - 99 mg/dL   BUN 12 6 - 20 mg/dL   Creatinine, Ser 0.72 0.44 - 1.00 mg/dL   Calcium 8.0 (L) 8.9 - 10.3 mg/dL   GFR calc non Af Amer >60 >60 mL/min   GFR calc Af Amer >60 >60 mL/min    Comment: (NOTE) The eGFR has been calculated using the CKD EPI equation. This calculation has not been validated in all clinical situations. eGFR's persistently <60 mL/min signify possible Chronic Kidney Disease.    Anion gap 5 5 - 15  Blood gas, arterial     Status: Abnormal   Collection Time: 01/17/17  9:15 AM  Result Value Ref Range   FIO2 0.60    Delivery systems BILEVEL POSITIVE AIRWAY PRESSURE    LHR 12 resp/min   Inspiratory PAP 14    Expiratory PAP 5    pH, Arterial 7.42 7.350 - 7.450   pCO2 arterial 48 32.0 - 48.0 mmHg   pO2, Arterial 91 83.0 - 108.0 mmHg   Bicarbonate 31.1 (H) 20.0 -  28.0 mmol/L   Acid-Base Excess 5.9 (H) 0.0 - 2.0 mmol/L   O2 Saturation 97.2 %   Patient temperature 37.0    Collection site RIGHT BRACHIAL    Sample type ARTERIAL DRAW    Allens test (pass/fail) PASS PASS  Basic metabolic panel     Status: Abnormal   Collection Time: 01/17/17  9:57 PM  Result Value Ref Range   Sodium 136 135 - 145 mmol/L   Potassium 3.8 3.5 - 5.1 mmol/L   Chloride 98 (L) 101 - 111 mmol/L   CO2 31 22 - 32 mmol/L   Glucose, Bld 173 (H) 65 - 99 mg/dL   BUN 13 6 - 20 mg/dL   Creatinine, Ser 0.63 0.44 - 1.00 mg/dL   Calcium 7.8 (L) 8.9 - 10.3 mg/dL   GFR calc non Af Amer >60 >60 mL/min   GFR calc Af Amer >60 >60 mL/min    Comment: (NOTE) The eGFR has been calculated using the CKD EPI equation. This calculation has not been validated in all clinical situations. eGFR's persistently <60 mL/min signify possible Chronic Kidney Disease.    Anion gap 7 5 - 15  Magnesium     Status: None   Collection Time: 01/17/17  9:57 PM  Result Value Ref Range   Magnesium 1.9 1.7 - 2.4 mg/dL  Phosphorus     Status: None   Collection Time: 01/17/17  9:57 PM  Result Value Ref Range   Phosphorus 3.3 2.5 - 4.6 mg/dL  CBC  Status: Abnormal   Collection Time: 01/18/17  4:02 AM  Result Value Ref Range   WBC 7.2 3.6 - 11.0 K/uL   RBC 2.50 (L) 3.80 - 5.20 MIL/uL   Hemoglobin 7.3 (L) 12.0 - 16.0 g/dL   HCT 22.6 (L) 35.0 - 47.0 %   MCV 90.6 80.0 - 100.0 fL   MCH 29.1 26.0 - 34.0 pg   MCHC 32.2 32.0 - 36.0 g/dL   RDW 16.5 (H) 11.5 - 14.5 %   Platelets 324 150 - 440 K/uL  Basic metabolic panel     Status: Abnormal   Collection Time: 01/18/17  4:02 AM  Result Value Ref Range   Sodium 138 135 - 145 mmol/L   Potassium 3.6 3.5 - 5.1 mmol/L   Chloride 100 (L) 101 - 111 mmol/L   CO2 31 22 - 32 mmol/L   Glucose, Bld 176 (H) 65 - 99 mg/dL   BUN 12 6 - 20 mg/dL   Creatinine, Ser 0.67 0.44 - 1.00 mg/dL   Calcium 7.7 (L) 8.9 - 10.3 mg/dL   GFR calc non Af Amer >60 >60 mL/min   GFR calc  Af Amer >60 >60 mL/min    Comment: (NOTE) The eGFR has been calculated using the CKD EPI equation. This calculation has not been validated in all clinical situations. eGFR's persistently <60 mL/min signify possible Chronic Kidney Disease.    Anion gap 7 5 - 15  Glucose, capillary     Status: Abnormal   Collection Time: 01/18/17  8:29 AM  Result Value Ref Range   Glucose-Capillary 147 (H) 65 - 99 mg/dL  Glucose, capillary     Status: Abnormal   Collection Time: 01/18/17 12:45 PM  Result Value Ref Range   Glucose-Capillary 133 (H) 65 - 99 mg/dL   No components found for: ESR, C REACTIVE PROTEIN MICRO: Recent Results (from the past 720 hour(s))  Blood Culture (routine x 2)     Status: Abnormal (Preliminary result)   Collection Time: 01/15/17  1:39 PM  Result Value Ref Range Status   Specimen Description BLOOD BLOOD RIGHT FOREARM  Final   Special Requests   Final    BOTTLES DRAWN AEROBIC AND ANAEROBIC Blood Culture results may not be optimal due to an excessive volume of blood received in culture bottles   Culture  Setup Time   Final    GRAM POSITIVE COCCI IN BOTH AEROBIC AND ANAEROBIC BOTTLES CRITICAL RESULT CALLED TO, READ BACK BY AND VERIFIED WITHTheodis Shove @ [phone] 01/16/17 by [facility]    Culture (A)  Final    STAPHYLOCOCCUS AUREUS CULTURE REINCUBATED FOR BETTER GROWTH Performed at [facility], [place] [address], [place], [place] [address]    Report Status PENDING  Incomplete   Organism ID, Bacteria STAPHYLOCOCCUS AUREUS  Final      Susceptibility   Staphylococcus aureus - MIC*    CIPROFLOXACIN >=8 RESISTANT Resistant     ERYTHROMYCIN <=0.25 SENSITIVE Sensitive     GENTAMICIN <=0.5 SENSITIVE Sensitive     OXACILLIN <=0.25 SENSITIVE Sensitive     TETRACYCLINE <=1 SENSITIVE Sensitive     VANCOMYCIN 1 SENSITIVE Sensitive     TRIMETH/SULFA <=10 SENSITIVE Sensitive     CLINDAMYCIN <=0.25 SENSITIVE Sensitive     RIFAMPIN <=0.5 SENSITIVE Sensitive     Inducible Clindamycin  NEGATIVE Sensitive     * STAPHYLOCOCCUS AUREUS  Blood Culture (routine x 2)     Status: None (Preliminary result)   Collection Time: 01/15/17  1:39 PM  Result Value Ref Range Status   Specimen Description BLOOD BLOOD LEFT FOREARM  Final   Special Requests   Final    BOTTLES DRAWN AEROBIC AND ANAEROBIC Blood Culture results may not be optimal due to an excessive volume of blood received in culture bottles   Culture NO GROWTH 3 DAYS  Final   Report Status PENDING  Incomplete  Blood Culture ID Panel (Reflexed)     Status: Abnormal   Collection Time: 01/15/17  1:39 PM  Result Value Ref Range Status   Enterococcus species NOT DETECTED NOT DETECTED Final   Listeria monocytogenes NOT DETECTED NOT DETECTED Final   Staphylococcus species DETECTED (A) NOT DETECTED Final    Comment: CRITICAL RESULT CALLED TO, READ BACK BY AND VERIFIED WITHTheodis Shove @ [phone] 01/16/17 by [facility]    Staphylococcus aureus DETECTED (A) NOT DETECTED Final    Comment: Methicillin (oxacillin) susceptible Staphylococcus aureus (MSSA). Preferred therapy is anti staphylococcal beta lactam antibiotic (Cefazolin or Nafcillin), unless clinically contraindicated. CRITICAL RESULT CALLED TO, READ BACK BY AND VERIFIED WITH: Theodis Shove @ [phone] 01/16/17 by [facility]    Methicillin resistance NOT DETECTED NOT DETECTED Final   Streptococcus species NOT DETECTED NOT DETECTED Final   Streptococcus agalactiae NOT DETECTED NOT DETECTED Final   Streptococcus pneumoniae NOT DETECTED NOT DETECTED Final   Streptococcus pyogenes NOT DETECTED NOT DETECTED Final   Acinetobacter baumannii NOT DETECTED NOT DETECTED Final   Enterobacteriaceae species NOT DETECTED NOT DETECTED Final   Enterobacter cloacae complex NOT DETECTED NOT DETECTED Final   Escherichia coli NOT DETECTED NOT DETECTED Final   Klebsiella oxytoca NOT DETECTED NOT DETECTED Final   Klebsiella pneumoniae NOT DETECTED NOT DETECTED Final   Proteus species NOT DETECTED NOT DETECTED Final    Serratia marcescens NOT DETECTED NOT DETECTED Final   Haemophilus influenzae NOT DETECTED NOT DETECTED Final   Neisseria meningitidis NOT DETECTED NOT DETECTED Final   Pseudomonas aeruginosa NOT DETECTED NOT DETECTED Final   Candida albicans NOT DETECTED NOT DETECTED Final   Candida glabrata NOT DETECTED NOT DETECTED Final   Candida krusei NOT DETECTED NOT DETECTED Final   Candida parapsilosis NOT DETECTED NOT DETECTED Final   Candida tropicalis NOT DETECTED NOT DETECTED Final  MRSA PCR Screening     Status: None   Collection Time: 01/16/17  7:32 AM  Result Value Ref Range Status   MRSA by PCR NEGATIVE NEGATIVE Final    Comment:        The GeneXpert MRSA Assay (FDA approved for NASAL specimens only), is one component of a comprehensive MRSA colonization surveillance program. It is not intended to diagnose MRSA infection nor to guide or monitor treatment for MRSA infections.   Culture, blood (Routine X 2) w Reflex to ID Panel     Status: None (Preliminary result)   Collection Time: 01/16/17  9:04 AM  Result Value Ref Range Status   Specimen Description BLOOD LEFT ANTECUBITAL  Final   Special Requests   Final    BOTTLES DRAWN AEROBIC AND ANAEROBIC Blood Culture adequate volume   Culture NO GROWTH 2 DAYS  Final   Report Status PENDING  Incomplete  Culture, blood (Routine X 2) w Reflex to ID Panel     Status: None (Preliminary result)   Collection Time: 01/16/17  9:04 AM  Result Value Ref Range Status   Specimen Description BLOOD RIGHT ANTECUBITAL  Final   Special Requests   Final    BOTTLES DRAWN AEROBIC AND ANAEROBIC Blood  Culture results may not be optimal due to an excessive volume of blood received in culture bottles   Culture NO GROWTH 2 DAYS  Final   Report Status PENDING  Incomplete  Aerobic Culture (superficial specimen)     Status: None   Collection Time: 01/16/17 11:11 AM  Result Value Ref Range Status   Specimen Description WOUND ANKLE  Final   Special Requests  NONE  Final   Gram Stain   Final    FEW WBC PRESENT, PREDOMINANTLY PMN ABUNDANT GRAM POSITIVE COCCI IN PAIRS FEW GRAM POSITIVE RODS Performed at [facility], [place] [address], [place], [place] [address]    Culture MODERATE STAPHYLOCOCCUS AUREUS  Final   Report Status 01/18/2017 FINAL  Final   Organism ID, Bacteria STAPHYLOCOCCUS AUREUS  Final      Susceptibility   Staphylococcus aureus - MIC*    CIPROFLOXACIN >=8 RESISTANT Resistant     ERYTHROMYCIN <=0.25 SENSITIVE Sensitive     GENTAMICIN <=0.5 SENSITIVE Sensitive     OXACILLIN <=0.25 SENSITIVE Sensitive     TETRACYCLINE <=1 SENSITIVE Sensitive     VANCOMYCIN <=0.5 SENSITIVE Sensitive     TRIMETH/SULFA <=10 SENSITIVE Sensitive     CLINDAMYCIN <=0.25 SENSITIVE Sensitive     RIFAMPIN <=0.5 SENSITIVE Sensitive     Inducible Clindamycin NEGATIVE Sensitive     * MODERATE STAPHYLOCOCCUS AUREUS    IMAGING: US Venous Img Lower Unilateral Right  Result Date: 01/16/2017 CLINICAL DATA:  Right lower extremity swelling and redness for 1 week. EXAM: RIGHT LOWER EXTREMITY VENOUS DOPPLER ULTRASOUND TECHNIQUE: Gray-scale sonography with graded compression, as well as color Doppler and duplex ultrasound were performed to evaluate the lower extremity deep venous systems from the level of the common femoral vein and including the common femoral, femoral, profunda femoral, popliteal and calf veins including the posterior tibial, peroneal and gastrocnemius veins when visible. The superficial great saphenous vein was also interrogated. Spectral Doppler was utilized to evaluate flow at rest and with distal augmentation maneuvers in the common femoral, femoral and popliteal veins. COMPARISON:  None. FINDINGS: Contralateral Common Femoral Vein: Respiratory phasicity is normal and symmetric with the symptomatic side. No evidence of thrombus. Normal compressibility. Common Femoral Vein: No evidence of thrombus. Normal compressibility, respiratory phasicity  and response to augmentation. Saphenofemoral Junction: No evidence of thrombus. Normal compressibility and flow on color Doppler imaging. Profunda Femoral Vein: No evidence of thrombus. Normal compressibility and flow on color Doppler imaging. Femoral Vein: No evidence of thrombus. Normal compressibility, respiratory phasicity and response to augmentation. Popliteal Vein: No evidence of thrombus. Normal compressibility, respiratory phasicity and response to augmentation. Calf Veins: No evidence of thrombus. Normal compressibility and flow on color Doppler imaging. Peroneal vein not visualized. Superficial Great Saphenous Vein: No evidence of thrombus. Normal compressibility and flow on color Doppler imaging. Venous Reflux:  None. Other Findings:  None. IMPRESSION: No evidence of DVT within the right lower extremity. Electronically Signed   By: Lajean Manes M.D.   On: 01/16/2017 12:21   Dg Chest Port 1 View  Result Date: 01/18/2017 CLINICAL DATA:  Congestive heart failure EXAM: PORTABLE CHEST 1 VIEW COMPARISON:  January 17, 2017 FINDINGS: There is persistent patchy interstitial and alveolar opacity, likely edema. There is cardiomegaly with pulmonary venous hypertension. There is aortic atherosclerosis. No adenopathy. No bone lesions. IMPRESSION: Persistent patchy interstitial and alveolar opacities, likely due to edema, although superimposed pneumonia in the right upper lobe cannot be excluded. Stable cardiomegaly. Overall no change from 1 day  prior. There is aortic atherosclerosis. Electronically Signed   By: Lowella Grip III M.D.   On: 01/18/2017 07:14   Dg Chest Port 1 View  Result Date: 01/17/2017 CLINICAL DATA:  Worsening shortness breath.  Pneumonia.  Sepsis. EXAM: PORTABLE CHEST 1 VIEW COMPARISON:  01/16/2017 FINDINGS: The heart size and mediastinal contours are within normal limits. Mixed interstitial and airspace disease is again seen bilaterally, showing mild interval worsening in the right upper  and lower lung fields. No evidence of pneumothorax or pleural effusion. IMPRESSION: Diffuse mixed interstitial and airspace disease, with mild interval worsening in the right lung. Electronically Signed   By: Earle Gell M.D.   On: 01/17/2017 08:46   Dg Chest Port 1 View  Result Date: 01/16/2017 CLINICAL DATA:  83 year old female recently admitted with pneumonia and sepsis, acute respiratory failure today EXAM: PORTABLE CHEST 1 VIEW COMPARISON:  Prior chest x-ray 01/15/2017 FINDINGS: Cardiomegaly is unchanged. Atherosclerotic calcifications again noted in the transverse aorta. Diffuse interstitial prominence bilaterally, slightly improved compared to prior. Areas of patchy interstitial and airspace opacity are present within the right upper and left mid lung. Overall, inspiratory volumes and aeration are improved. IMPRESSION: 1. Overall, inspiratory volumes and pulmonary aeration are improved compared to yesterday likely secondary to decreased interstitial edema. 2. Patchy airspace opacities in the right upper and left mid lung may reflect multifocal pneumonia. 3.  Aortic Atherosclerosis (ICD10-170.0) 4. Background of chronic bronchitic change and interstitial prominence. Electronically Signed   By: Jacqulynn Cadet M.D.   On: 01/16/2017 10:14   Dg Chest Portable 1 View  Result Date: 01/15/2017 CLINICAL DATA:  Fever and increasing shortness of breath.  Hypoxia. EXAM: PORTABLE CHEST 1 VIEW COMPARISON:  07/22/2016, 05/18/2016, 08/29/2015 and 05/16/2015 as well as chest CT dated 07/22/2015 FINDINGS: The patient has diffuse infiltrates throughout the right lung inter lesser degree in the left lung base. Heart size is normal. Slight pulmonary vascular prominence. No discrete effusions. Extensive calcification in the thoracic aorta. IMPRESSION: Extensive bilateral pulmonary infiltrates, right worse than left. Aortic atherosclerosis. Electronically Signed   By: Lorriane Shire M.D.   On: 01/15/2017 15:03     Assessment:   Theresa Vasquez is a 83 y.o. female with MSSA PNA and bacteremia. She was quite ill on admission and required transfer to ICU. TTE negative for vegetation.  FU bcx 6/30 negative.  Likely has primary MSSA PNA as source of bacteremia but could also have had a skin source with chronic bil le changes and small heel ulcer.  She is at mod risk of endocarditis with valvular dysfunction but given her frailily, O2, requirement and active PNA is a relatively high risk for TEE.   Recommendations Will need 2-4 weeks IV ancef for MSSA bacteremia Place PICC tomorrow if bcx remains negative. Can dc on IV ancef when otherwise stable and I can see in 2-4 weeks in fu.  Thank you very much for allowing me to participate in the care of this patient. Please call with questions.   Cheral Marker. Ola Spurr, MD

## 2017-01-18 NOTE — Progress Notes (Signed)
[facility] [place] NAME: Theresa Vasquez    MR#:  [id number]  DATE OF BIRTH:  01-Oct-1930  SUBJECTIVE:   Patient admitted to the hospital due to sepsis from pneumonia/cellulitis. Transfer out of the ICU today. Off BiPAP and shortness of breath is improved. Family at bedside.  REVIEW OF SYSTEMS:    Review of Systems  Constitutional: Negative for chills and fever.  HENT: Negative for congestion and tinnitus.   Eyes: Negative for blurred vision and double vision.  Respiratory: Negative for cough, shortness of breath and wheezing.   Cardiovascular: Negative for chest pain, orthopnea and PND.  Gastrointestinal: Negative for abdominal pain, diarrhea, nausea and vomiting.  Genitourinary: Negative for dysuria and hematuria.  Neurological: Negative for dizziness, sensory change and focal weakness.  All other systems reviewed and are negative.   Nutrition: Heart Healthy Tolerating Diet: Yes Tolerating PT: Await Eval.   DRUG ALLERGIES:  No Known Allergies  VITALS:  Blood pressure 121/61, pulse 87, temperature 98.8 F (37.1 C), temperature source Oral, resp. rate 16, height _0  (1.702 m), weight 90.7 kg (199 lb 15.3 oz), SpO2 93 %.  PHYSICAL EXAMINATION:   Physical Exam  GENERAL:  83 y.o.-year-old patient lying in bed in NAD.   EYES: Pupils equal, round, reactive to light and accommodation. No scleral icterus. Extraocular muscles intact.  HEENT: Head atraumatic, normocephalic. Oropharynx and nasopharynx clear.  NECK:  Supple, no jugular venous distention. No thyroid enlargement, no tenderness.  LUNGS: Normal breath sounds bilaterally, no wheezing, rales, rhonchi. + use of accessory muscles of respiration.  CARDIOVASCULAR: S1, S2 normal. No murmurs, rubs, or gallops.  ABDOMEN: Soft, nontender, nondistended. Bowel sounds present. No organomegaly or mass.  EXTREMITIES: No cyanosis, clubbing or edema b/l.   Signs of chronic venous stasis  bilaterally, mild right lower extremity redness noted. NEUROLOGIC: Cranial nerves II through XII are intact. No focal Motor or sensory deficits b/l. Globally weak   PSYCHIATRIC: The patient is alert and oriented x 3.  SKIN: No obvious rash, lesion, stage I pressure ulcer on the medial part of the left foot   LABORATORY PANEL:   CBC  Recent Labs Lab 01/18/17 0402  WBC 7.2  HGB 7.3*  HCT 22.6*  PLT 324   ------------------------------------------------------------------------------------------------------------------  Chemistries   Recent Labs Lab 01/15/17 1339  01/17/17 2157 01/18/17 0402  NA 137  < > 136 138  K 4.8  < > 3.8 3.6  CL 100*  < > 98* 100*  CO2 25  < > 31 31  GLUCOSE 176*  < > 173* 176*  BUN 16  < > 13 12  CREATININE 0.81  < > 0.63 0.67  CALCIUM 8.2*  < > 7.8* 7.7*  MG  --   --  1.9  --   AST 45*  --   --   --   ALT 17  --   --   --   ALKPHOS 48  --   --   --   BILITOT 0.7  --   --   --   < > = values in this interval not displayed. ------------------------------------------------------------------------------------------------------------------  Cardiac Enzymes No results for input(s): TROPONINI in the last 168 hours. ------------------------------------------------------------------------------------------------------------------  RADIOLOGY:  Dg Chest Port 1 View  Result Date: 01/18/2017 CLINICAL DATA:  Congestive heart failure EXAM: PORTABLE CHEST 1 VIEW COMPARISON:  January 17, 2017 FINDINGS: There is persistent patchy interstitial and alveolar opacity, likely edema. There is cardiomegaly with pulmonary  venous hypertension. There is aortic atherosclerosis. No adenopathy. No bone lesions. IMPRESSION: Persistent patchy interstitial and alveolar opacities, likely due to edema, although superimposed pneumonia in the right upper lobe cannot be excluded. Stable cardiomegaly. Overall no change from 1 day prior. There is aortic atherosclerosis. Electronically  Signed   By: Lowella Grip III M.D.   On: 01/18/2017 07:14   Dg Chest Port 1 View  Result Date: 01/17/2017 CLINICAL DATA:  Worsening shortness breath.  Pneumonia.  Sepsis. EXAM: PORTABLE CHEST 1 VIEW COMPARISON:  01/16/2017 FINDINGS: The heart size and mediastinal contours are within normal limits. Mixed interstitial and airspace disease is again seen bilaterally, showing mild interval worsening in the right upper and lower lung fields. No evidence of pneumothorax or pleural effusion. IMPRESSION: Diffuse mixed interstitial and airspace disease, with mild interval worsening in the right lung. Electronically Signed   By: Earle Gell M.D.   On: 01/17/2017 08:46     ASSESSMENT AND PLAN:   83 year old female history of diabetes, vascular dementia, peripheral vascular disease, hypothyroidism, atrial fibrillation, anxiety/depression, COPD who presented to the hospital due to fever, leukocytosis and suspected have sepsis secondary to pneumonia/cellulitis.  1. Sepsis-patient met criteria on admission given leukocytosis, fever. Chest x-ray findings suggestive of bilateral infiltrates. -Blood cultures were positive for MSSA and narrowed back down to Ancef by ID and repeat cultures so far (-).  -Echo (-) for vegetations with normal EF.   2. Pneumonia-source of patient's sepsis. Likely staph pneumonia given patient's bacteremia. - cont. IV Ancef and can switch to Oral Keflex upon discharge. Echo (-) for endocarditis with no vegetation and normal  EF.  -No longer hypoxic and off BiPAP.    3. Anemia of chronic disease - Hg. Improved post transfusion and will cont. To monitor.  - no acute bleeding noted.  4. Hypothyroidism-continue Synthroid.  5. History of atrial fibrillation-rate controlled. Continue amiodarone. - not on long term anti-coagulation due to high fall risk.    6. COPD-no acute exacerbation-continue Spiriva, Dulera.  7. GERD-continue Protonix.  8. Depression-continue Effexor.  9.  Dementia - cont. Aricept.   Await PT eval today.   All the records are reviewed and case discussed with Care Management/Social Worker. Management plans discussed with the patient, family and they are in agreement.  CODE STATUS: Full code  DVT Prophylaxis: Full code  TOTAL TIME TAKING CARE OF THIS PATIENT: 30 minutes.   POSSIBLE D/C IN 1-2 DAYS, DEPENDING ON CLINICAL CONDITION.   Henreitta Leber M.D on 01/18/2017 at 2:02 PM  Between 7am to 6pm - Pager - [phone]  After 6pm go to [email or web address] - Technical brewer [facility]  Office  [phone]  CC: Primary care physician; Valerie Roys, DO

## 2017-01-18 NOTE — Progress Notes (Signed)
Patient arrived from ICU.  VSS

## 2017-01-18 NOTE — Progress Notes (Signed)
01/18/17 0800  Charting Type  Charting Type Shift assessment  Orders Chart Check (once per shift) Completed  Neurological  Neuro (WDL) X  Level of Consciousness Alert  Orientation Level Oriented to person;Oriented to place;Disoriented to time;Disoriented to situation  Chartered certified accountant commands;Memory impairment  Speech Clear  Pupil Assessment  Yes  R Pupil Size (mm) 3  R Pupil Shape Round  R Pupil Reaction Brisk  L Pupil Size (mm) 3  L Pupil Shape Round  L Pupil Reaction Brisk  Additional Pupil Assessments No  Motor Function/Sensation Assessment Motor response  R Hand Grip Present;Strong  L Hand Grip Present;Strong  RUE Motor Response Purposeful movement  LUE Motor Response Purposeful movement  Neuro Symptoms Forgetful  Glasgow Coma Scale  Eye Opening 4  Best Verbal Response (NON-intubated) 4  Best Motor Response 6  Glasgow Coma Scale Score 14  Richmond Agitation Sedation Scale  Richmond Agitation Sedation Scale (RASS) 0  RASS Goal 0  CAM-ICU Feature 1:  Acute onset of fluctuating course  Is the patient different from his/her baseline mental status? No  Has the patient had any fluctuation in mental status in the past 24 hours? No  CAM-ICU Feature 4:  Disorganized thinking  CAM-ICU Negative? Yes  Delirium Prevention:  Universal Requirements (Complete for all ICU patients)  Universal Precautions Initiated *See Row Information* Yes  HEENT  HEENT (WDL) X  Vision Check No  Mucous Membrane(s) Moist  R Ear Impaired hearing  L Ear Impaired hearing  Teeth Intact  Tongue Pink  Voice Clear  Respiratory  Respiratory (WDL) X  Cough None  Respiratory Pattern Regular  Chest Assessment Chest expansion symmetrical  Bilateral Breath Sounds Clear;Diminished  Cardiac  Cardiac (WDL) X  Pulse Regular  Cardiac Rhythm NSR;BBB  Telemetry Box Number [id number]  Tele Box Verification Completed by Second Verifier Completed  Antiarrhythmic device No  Vascular  Vascular (WDL) X   Cyanosis None  Capillary Refill Less than 3 seconds  Pulses R radial;L radial;R dorsalis pedis;L dorsalis pedis  Edema Right lower extremity;Left lower extremity  RLE Edema +1  LLE Edema +1  RUE Neurovascular Assessment  R Radial Pulse +2  LUE Neurovascular Assessment  L Radial Pulse +2  RLE Neurovascular Assessment  R Dorsalis Pedis Pulse +1  LLE Neurovascular Assessment  L Dorsalis Pedis Pulse +1  Integumentary  Integumentary (WDL) X  Skin Color Appropriate for ethnicity;Other (Comment) (bilateral feet red in color)  Skin Condition Dry;Flaky  Skin Integrity Ecchymosis  Cellulitis Location Leg  Cellulitis Location Orientation Right;Lower  Ecchymosis Location Other (Comment) (scattered)  Sacral Foam Prophylactic Dressing Protocol  Dressing Interventions Dressing intact  Dressing Change Due 01/22/17  Braden Scale (Ages 36 and up)  Sensory Perceptions 3  Moisture 3  Activity 3  Mobility 3  Nutrition 4  Friction and Shear 3  Braden Scale Score 19  Braden Interventions  Braden Scale Interventions Reposition q2h;Heels  Musculoskeletal  Musculoskeletal (WDL) X  Assistive Device MaxiSlide  Generalized Weakness Yes  Gastrointestinal  Gastrointestinal (WDL) X  Abdomen Inspection Soft;Obese  Bowel Sounds Assessment Active  Tenderness Nontender  Last BM Date 01/16/17  Passing Flatus No  GI Symptoms Constipation  Constipation  Constipation Precipitating Factors Medication (iron supplements, opiods, antidepressants etc)  Constipation interventions Laxative;Stool Softener  Stool Characteristics  Bowel Incontinence No  GU Assessment  Genitourinary (WDL) X  Genitourinary Symptoms Urinary Catheter  Genitalia  Female Genitalia Intact  Urine Characteristics  Urinary Incontinence No  Urine Color Yellow/straw  Urine Appearance Clear  Urine Odor No odor  Psychosocial  Psychosocial (WDL) WDL  Patient Behaviors Cooperative;Calm  Needs Expressed Physical  Emotional support  given Given to patient  Wound/Incision (LDAs)  Type of Wound/Incision (LDA) Pressure injury  Pressure Injury 01/15/17 Stage II -  Partial thickness loss of dermis presenting as a shallow open ulcer with a red, pink wound bed without slough. healing red,partial scab area  Date First Assessed/Time First Assessed: 01/15/17 1850   Location: Foot  Location Orientation: Left  Staging: Stage II -  Partial thickness loss of dermis presenting as a shallow open ulcer with a red, pink wound bed without slough.  Wound Description...  Dressing Type Foam  Dressing Changed  Dressing Change Frequency Every 3 days  Drainage Amount None  Treatment Cleansed

## 2017-01-18 NOTE — Evaluation (Signed)
Clinical/Bedside Swallow Evaluation Patient Details  Name: CARILYN WOOLSTON MRN: [id number] Date of Birth: [birth year]  Today's Date: 01/18/2017 Time: SLP Start Time (ACUTE ONLY): 0830 SLP Stop Time (ACUTE ONLY): 0930 SLP Time Calculation (min) (ACUTE ONLY): 60 min  Past Medical History:  Past Medical History:  Diagnosis Date  . Adenomatous polyps   . Age-related osteoporosis without current pathological fracture   . Anemia   . Anxiety   . Atherosclerosis of aorta ([place])   . Atrial fibrillation with RVR ([place])   . Barrett's esophagus without dysplasia   . CHF (congestive heart failure) ([place])   . Colitis   . COPD (chronic obstructive pulmonary disease) ([place])   . Dementia   . Depression   . Diabetes mellitus type II, uncontrolled ([place])   . Diverticulitis   . Diverticulosis of intestine without perforation or abscess without bleeding   . Edema   . Essential hemorrhagic thrombocythemia ([place])   . Gastrointestinal hemorrhage   . High anion gap metabolic acidosis   . Hyperlipidemia   . Hypertension   . Hypertensive chronic kidney disease with stage 1 through stage 4 chronic kidney disease, or unspecified chronic kidney disease   . Hypothyroidism   . Hypoxemia   . Iron deficiency anemia   . Lactic acidosis   . Left bundle branch block   . Nonrheumatic mitral valve insufficiency   . Osteoarthritis   . Other psychoactive substance dependence, in remission ([place])   . Other specified symptoms and signs involving the circulatory and respiratory systems   . Paroxysmal atrial fibrillation ([facility])    s/p cardioversion  . Personal history of peptic ulcer disease   . Pulmonary edema   . PVD (peripheral vascular disease) ([place]) 05/14/2015  . Recurrent pneumonia    history of  . Respiratory failure with hypoxia and hypercapnia ([place])   . Type 2 diabetes mellitus with diabetic chronic kidney disease ([place])   . Type 2 diabetes mellitus with diabetic polyneuropathy ([place])   . Vascular dementia  without behavioral disturbance   . Vascular disorder of intestine [facility])    Past Surgical History:  Past Surgical History:  Procedure Laterality Date  . ABDOMINAL HYSTERECTOMY     partial  . APPENDECTOMY    . COLONOSCOPY    . ESOPHAGOGASTRODUODENOSCOPY    . ESOPHAGOGASTRODUODENOSCOPY (EGD) WITH PROPOFOL N/A 07/04/2015   Procedure: ESOPHAGOGASTRODUODENOSCOPY (EGD) WITH PROPOFOL;  Surgeon: Manya Silvas, MD;  Location: [facility] ENDOSCOPY;  Service: Endoscopy;  Laterality: N/A;  . HIP SURGERY Left 06/2016   Rod placed   . INTRAMEDULLARY (IM) NAIL INTERTROCHANTERIC Left 06/18/2016   Procedure: INTRAMEDULLARY (IM) NAIL INTERTROCHANTRIC;  Surgeon: Dereck Leep, MD;  Location: [facility] ORS;  Service: Orthopedics;  Laterality: Left;  . SHOULDER ARTHROSCOPY    . SIGMOIDOSCOPY     HPI:  Pt is a 83 y.o. female with a known history of Dementia, osteoporosis, atrial fibrillation, Barrett's esophagus, CHF, colitis, COPD, depression, diabetes, gastrointestinal hemorrhage, hypothyroidism, diabetes, peripheral vascular disease- has dementia and lives at home with 24 hours caretaker. Her son lives nearby and he checks on her every day and over the weekends. Pt was admitted w/ fever and Hypoxia.    Assessment / Plan / Recommendation Clinical Impression  Pt appears at reduced risk for aspiration from an oropharyngeal phase standpoint of swallowing when following general aspiration precautions. However, d/t her baseline Barrett's Esophagus and GERD baseline, pt is at increased risk for aspiration of Reflux and regurgitated food/liquid material. Pt does have some increased  risk for oropharyngeal phase dysphagia d/t baseline Pulmonary and Cognitive status'(baseline Dementia). Pt consumed trials of thin liquids via cup and straw, and trials of purees/soft solid foods w/ no overt s/s of aspiration noted; no decline in respiratory status or change in vocal quality post trials - O2 sats remained at baseline. Pt fed self  w/ setup support and became min drowsy toward end of the breakfast meal. Educated caregiver and pt on general aspiration precautions including use of applesauce when swallowing pills if necessary; education given on Reflux precautions. Encouraged use of an Chiropodist for increased Pulmonary strength/support; MD ordered. ST services will f/u x for any further education needed. Recommended pt f/u w/ GI and a Dietician for further management d/t baseline comorbidities.  SLP Visit Diagnosis: Dysphagia, pharyngoesophageal phase (R13.14);Dysphagia, unspecified (R13.10) (Esophageal phase )    Aspiration Risk  Mild aspiration risk (for regurgitation of Reflux material) d/t Barrett's Esophagus baseline; also COPD and Dementia comorbidities   Diet Recommendation  Dysphagia level 3 w/ well-cut meats, moistened; Thin liquids. Strict REFLUX precautions; aspiration precautions and monitoring during meals  Medication Administration: Whole meds with puree (Crush if needed for easier swallowing)    Other  Recommendations Recommended Consults: Consider GI evaluation (for management and tx; Dietician) Oral Care Recommendations: Oral care BID;Staff/trained caregiver to provide oral care   Follow up Recommendations None      Frequency and Duration min 2x/week  1 week       Prognosis Prognosis for Safe Diet Advancement: Fair (-Good) Barriers to Reach Goals:  (baseline Esophageal phase dysmotility)      Swallow Study   General Date of Onset: 01/15/17 HPI: Pt is a 83 y.o. female with a known history of Dementia, osteoporosis, atrial fibrillation, Barrett's esophagus, CHF, colitis, COPD, depression, diabetes, gastrointestinal hemorrhage, hypothyroidism, diabetes, peripheral vascular disease- has dementia and lives at home with 24 hours caretaker. Her son lives nearby and he checks on her every day and over the weekends. Pt was admitted w/ fever and Hypoxia.  Type of Study: Bedside Swallow  Evaluation Previous Swallow Assessment: 07/21/2016 Diet Prior to this Study: Dysphagia 3 (soft);Thin liquids (recommended) Temperature Spikes Noted: No (wbc 7.2) Respiratory Status: Nasal cannula (3 liters) History of Recent Intubation: No Behavior/Cognition: Alert;Cooperative;Pleasant mood;Confused (baseline Dementia;  drowsy intermittently) Oral Cavity Assessment: Within Functional Limits Oral Care Completed by SLP: Recent completion by staff Oral Cavity - Dentition: Adequate natural dentition (missing only few) Vision: Functional for self-feeding Self-Feeding Abilities: Able to feed self;Needs assist;Needs set up (min drowsy intermittently) Patient Positioning: Upright in bed (pillow behind back) Baseline Vocal Quality: Normal Volitional Cough: Strong Volitional Swallow: Able to elicit    Oral/Motor/Sensory Function Overall Oral Motor/Sensory Function: Within functional limits   Ice Chips Ice chips: Not tested   Thin Liquid Thin Liquid: Within functional limits Presentation: Cup;Self Fed;Straw (4 trials via each)    Nectar Thick Nectar Thick Liquid: Not tested   Honey Thick Honey Thick Liquid: Not tested   Puree Puree: Within functional limits Presentation: Spoon;Self Fed (4 trials)   Solid   GO   Solid: Within functional limits (mech soft consistency foods) Presentation: Self Fed;Spoon (6+ trials) Other Comments: pt tended to chew foods min longer - suspect related to declined Cognitive status         Orinda Kenner, MS, CCC-SLP Jasper Hanf 01/18/2017,4:36 PM

## 2017-01-18 NOTE — Progress Notes (Signed)
ANTIBIOTIC CONSULT NOTE - INITIAL  Pharmacy Consult for Cefazolin  Indication: bacteremia  No Known Allergies  Patient Measurements: Height: 5\' 7"  (170.2 cm) Weight: 199 lb 15.3 oz (90.7 kg) IBW/kg (Calculated) : 61.6 Adjusted Body Weight:   Vital Signs: Temp: 98.8 F (37.1 C) (07/02 1220) Temp Source: Oral (07/02 1220) BP: 121/61 (07/02 1220) Pulse Rate: 87 (07/02 1220) Intake/Output from previous day: 07/01 0701 - 07/02 0700 In: 610 [P.O.:360; IV Piggyback:250] Out: 1630 [Urine:1630] Intake/Output from this shift: Total I/O In: 480 [P.O.:480] Out: 760 [Urine:760]  Labs:  Recent Labs  01/16/17 0333  01/16/17 2015 01/17/17 0531 01/17/17 2157 01/18/17 0402  WBC 10.0  --   --  8.7  --  7.2  HGB 6.5*  < > 7.7* 8.0*  --  7.3*  PLT 336  --   --  331  --  324  CREATININE 0.70  --   --  0.72 0.63 0.67  < > = values in this interval not displayed. Estimated Creatinine Clearance: 60.5 mL/min (by C-G formula based on SCr of 0.67 mg/dL). No results for input(s): VANCOTROUGH, VANCOPEAK, VANCORANDOM, GENTTROUGH, GENTPEAK, GENTRANDOM, TOBRATROUGH, TOBRAPEAK, TOBRARND, AMIKACINPEAK, AMIKACINTROU, AMIKACIN in the last 72 hours.   Microbiology: Recent Results (from the past 720 hour(s))  Blood Culture (routine x 2)     Status: Abnormal (Preliminary result)   Collection Time: 01/15/17  1:39 PM  Result Value Ref Range Status   Specimen Description BLOOD BLOOD RIGHT FOREARM  Final   Special Requests   Final    BOTTLES DRAWN AEROBIC AND ANAEROBIC Blood Culture results may not be optimal due to an excessive volume of blood received in culture bottles   Culture  Setup Time   Final    GRAM POSITIVE COCCI IN BOTH AEROBIC AND ANAEROBIC BOTTLES CRITICAL RESULT CALLED TO, READ BACK BY AND VERIFIED WITHTheodis Shove @ 8756 01/16/17 by [facility]    Culture (A)  Final    STAPHYLOCOCCUS AUREUS CULTURE REINCUBATED FOR BETTER GROWTH Performed at [facility], [place] [address],  [place], [place] [address]    Report Status PENDING  Incomplete   Organism ID, Bacteria STAPHYLOCOCCUS AUREUS  Final      Susceptibility   Staphylococcus aureus - MIC*    CIPROFLOXACIN >=8 RESISTANT Resistant     ERYTHROMYCIN <=0.25 SENSITIVE Sensitive     GENTAMICIN <=0.5 SENSITIVE Sensitive     OXACILLIN <=0.25 SENSITIVE Sensitive     TETRACYCLINE <=1 SENSITIVE Sensitive     VANCOMYCIN 1 SENSITIVE Sensitive     TRIMETH/SULFA <=10 SENSITIVE Sensitive     CLINDAMYCIN <=0.25 SENSITIVE Sensitive     RIFAMPIN <=0.5 SENSITIVE Sensitive     Inducible Clindamycin NEGATIVE Sensitive     * STAPHYLOCOCCUS AUREUS  Blood Culture (routine x 2)     Status: None (Preliminary result)   Collection Time: 01/15/17  1:39 PM  Result Value Ref Range Status   Specimen Description BLOOD BLOOD LEFT FOREARM  Final   Special Requests   Final    BOTTLES DRAWN AEROBIC AND ANAEROBIC Blood Culture results may not be optimal due to an excessive volume of blood received in culture bottles   Culture NO GROWTH 3 DAYS  Final   Report Status PENDING  Incomplete  Blood Culture ID Panel (Reflexed)     Status: Abnormal   Collection Time: 01/15/17  1:39 PM  Result Value Ref Range Status   Enterococcus species NOT DETECTED NOT DETECTED Final   Listeria monocytogenes NOT DETECTED  NOT DETECTED Final   Staphylococcus species DETECTED (A) NOT DETECTED Final    Comment: CRITICAL RESULT CALLED TO, READ BACK BY AND VERIFIED WITH: Theodis Shove @ 1443 01/16/17 by [facility]    Staphylococcus aureus DETECTED (A) NOT DETECTED Final    Comment: Methicillin (oxacillin) susceptible Staphylococcus aureus (MSSA). Preferred therapy is anti staphylococcal beta lactam antibiotic (Cefazolin or Nafcillin), unless clinically contraindicated. CRITICAL RESULT CALLED TO, READ BACK BY AND VERIFIED WITH: Theodis Shove @ 1540 01/16/17 by [facility]    Methicillin resistance NOT DETECTED NOT DETECTED Final   Streptococcus species NOT DETECTED NOT DETECTED Final    Streptococcus agalactiae NOT DETECTED NOT DETECTED Final   Streptococcus pneumoniae NOT DETECTED NOT DETECTED Final   Streptococcus pyogenes NOT DETECTED NOT DETECTED Final   Acinetobacter baumannii NOT DETECTED NOT DETECTED Final   Enterobacteriaceae species NOT DETECTED NOT DETECTED Final   Enterobacter cloacae complex NOT DETECTED NOT DETECTED Final   Escherichia coli NOT DETECTED NOT DETECTED Final   Klebsiella oxytoca NOT DETECTED NOT DETECTED Final   Klebsiella pneumoniae NOT DETECTED NOT DETECTED Final   Proteus species NOT DETECTED NOT DETECTED Final   Serratia marcescens NOT DETECTED NOT DETECTED Final   Haemophilus influenzae NOT DETECTED NOT DETECTED Final   Neisseria meningitidis NOT DETECTED NOT DETECTED Final   Pseudomonas aeruginosa NOT DETECTED NOT DETECTED Final   Candida albicans NOT DETECTED NOT DETECTED Final   Candida glabrata NOT DETECTED NOT DETECTED Final   Candida krusei NOT DETECTED NOT DETECTED Final   Candida parapsilosis NOT DETECTED NOT DETECTED Final   Candida tropicalis NOT DETECTED NOT DETECTED Final  MRSA PCR Screening     Status: None   Collection Time: 01/16/17  7:32 AM  Result Value Ref Range Status   MRSA by PCR NEGATIVE NEGATIVE Final    Comment:        The GeneXpert MRSA Assay (FDA approved for NASAL specimens only), is one component of a comprehensive MRSA colonization surveillance program. It is not intended to diagnose MRSA infection nor to guide or monitor treatment for MRSA infections.   Culture, blood (Routine X 2) w Reflex to ID Panel     Status: None (Preliminary result)   Collection Time: 01/16/17  9:04 AM  Result Value Ref Range Status   Specimen Description BLOOD LEFT ANTECUBITAL  Final   Special Requests   Final    BOTTLES DRAWN AEROBIC AND ANAEROBIC Blood Culture adequate volume   Culture NO GROWTH 2 DAYS  Final   Report Status PENDING  Incomplete  Culture, blood (Routine X 2) w Reflex to ID Panel     Status: None  (Preliminary result)   Collection Time: 01/16/17  9:04 AM  Result Value Ref Range Status   Specimen Description BLOOD RIGHT ANTECUBITAL  Final   Special Requests   Final    BOTTLES DRAWN AEROBIC AND ANAEROBIC Blood Culture results may not be optimal due to an excessive volume of blood received in culture bottles   Culture NO GROWTH 2 DAYS  Final   Report Status PENDING  Incomplete  Aerobic Culture (superficial specimen)     Status: None   Collection Time: 01/16/17 11:11 AM  Result Value Ref Range Status   Specimen Description WOUND ANKLE  Final   Special Requests NONE  Final   Gram Stain   Final    FEW WBC PRESENT, PREDOMINANTLY PMN ABUNDANT GRAM POSITIVE COCCI IN PAIRS FEW GRAM POSITIVE RODS Performed at [facility], [address],  [place], [place] [address]    Culture MODERATE STAPHYLOCOCCUS AUREUS  Final   Report Status 01/18/2017 FINAL  Final   Organism ID, Bacteria STAPHYLOCOCCUS AUREUS  Final      Susceptibility   Staphylococcus aureus - MIC*    CIPROFLOXACIN >=8 RESISTANT Resistant     ERYTHROMYCIN <=0.25 SENSITIVE Sensitive     GENTAMICIN <=0.5 SENSITIVE Sensitive     OXACILLIN <=0.25 SENSITIVE Sensitive     TETRACYCLINE <=1 SENSITIVE Sensitive     VANCOMYCIN <=0.5 SENSITIVE Sensitive     TRIMETH/SULFA <=10 SENSITIVE Sensitive     CLINDAMYCIN <=0.25 SENSITIVE Sensitive     RIFAMPIN <=0.5 SENSITIVE Sensitive     Inducible Clindamycin NEGATIVE Sensitive     * MODERATE STAPHYLOCOCCUS AUREUS    Medical History: Past Medical History:  Diagnosis Date  . Adenomatous polyps   . Age-related osteoporosis without current pathological fracture   . Anemia   . Anxiety   . Atherosclerosis of aorta ([place])   . Atrial fibrillation with RVR ([place])   . Barrett's esophagus without dysplasia   . CHF (congestive heart failure) ([place])   . Colitis   . COPD (chronic obstructive pulmonary disease) ([place])   . Dementia   . Depression   . Diabetes mellitus type II, uncontrolled  ([place])   . Diverticulitis   . Diverticulosis of intestine without perforation or abscess without bleeding   . Edema   . Essential hemorrhagic thrombocythemia ([place])   . Gastrointestinal hemorrhage   . High anion gap metabolic acidosis   . Hyperlipidemia   . Hypertension   . Hypertensive chronic kidney disease with stage 1 through stage 4 chronic kidney disease, or unspecified chronic kidney disease   . Hypothyroidism   . Hypoxemia   . Iron deficiency anemia   . Lactic acidosis   . Left bundle branch block   . Nonrheumatic mitral valve insufficiency   . Osteoarthritis   . Other psychoactive substance dependence, in remission ([place])   . Other specified symptoms and signs involving the circulatory and respiratory systems   . Paroxysmal atrial fibrillation (HCC)    s/p cardioversion  . Personal history of peptic ulcer disease   . Pulmonary edema   . PVD (peripheral vascular disease) ([place]) 05/14/2015  . Recurrent pneumonia    history of  . Respiratory failure with hypoxia and hypercapnia ([place])   . Type 2 diabetes mellitus with diabetic chronic kidney disease ([place])   . Type 2 diabetes mellitus with diabetic polyneuropathy ([place])   . Vascular dementia without behavioral disturbance   . Vascular disorder of intestine (HCC)     Medications:  Scheduled:  . amiodarone  100 mg Oral Daily  . aspirin EC  81 mg Oral Daily  . ceFAZolin  1 g Other Once  . chlorhexidine  15 mL Mouth Rinse BID  . divalproex  250 mg Oral Q8H  . donepezil  10 mg Oral QHS  . ferrous sulfate  325 mg Oral BID WC  . furosemide  40 mg Oral Daily  . gabapentin  400 mg Oral BID  . heparin  5,000 Units Subcutaneous Q8H  . insulin aspart  0-15 Units Subcutaneous TID WC  . insulin aspart  0-5 Units Subcutaneous QHS  . levothyroxine  150 mcg Oral QAC breakfast  . mouth rinse  15 mL Mouth Rinse q12n4p  . mometasone-formoterol  2 puff Inhalation BID  . pantoprazole  40 mg Oral BID  . QUEtiapine  25 mg Oral QHS  .  tiotropium  18 mcg Inhalation Daily  . venlafaxine XR  37.5 mg Oral Q breakfast   Assessment: 83 y/o F with MSSA PNA/bacteremia.   Plan:  Will increase cefazolin dosing to 2 g iv q 8 hours for bacteremia.   Ulice Dash D 01/18/2017,1:52 PM

## 2017-01-19 LAB — GLUCOSE, CAPILLARY
GLUCOSE-CAPILLARY: 148 mg/dL — AB (ref 65–99)
GLUCOSE-CAPILLARY: 130 mg/dL — AB (ref 65–99)
Glucose-Capillary: 220 mg/dL — ABNORMAL HIGH (ref 65–99)
Glucose-Capillary: 143 mg/dL — ABNORMAL HIGH (ref 65–99)

## 2017-01-19 LAB — HEMOGLOBIN: HEMOGLOBIN: 8 g/dL — AB (ref 12.0–16.0)

## 2017-01-19 MED ORDER — HEPARIN SOD (PORK) LOCK FLUSH 100 UNIT/ML IV SOLN
250.0000 [IU] | Freq: Once | INTRAVENOUS | Status: AC
  Administered 2017-01-19: 250 [IU] via INTRAVENOUS
  Filled 2017-01-19: qty 3

## 2017-01-19 MED ORDER — CEFAZOLIN SODIUM-DEXTROSE 2-4 GM/100ML-% IV SOLN: 2.0000 g | Freq: Three times a day (TID) | INTRAVENOUS | Status: AC

## 2017-01-19 MED ORDER — HEPARIN SOD (PORK) LOCK FLUSH 100 UNIT/ML IV SOLN
250.0000 [IU] | INTRAVENOUS | Status: AC | PRN
  Administered 2017-01-19: 250 [IU]

## 2017-01-19 MED ORDER — SODIUM CHLORIDE 0.9% FLUSH
10.0000 mL | INTRAVENOUS | Status: DC | PRN
  Administered 2017-01-19: 10 mL

## 2017-01-19 MED ORDER — HEPARIN SOD (PORK) LOCK FLUSH 100 UNIT/ML IV SOLN: 500.0000 [IU] | Freq: Once | INTRAVENOUS | Status: DC

## 2017-01-19 MED ORDER — CEFAZOLIN IV (FOR PTA / DISCHARGE USE ONLY): 2.0000 g | Freq: Three times a day (TID) | INTRAVENOUS | 0 refills | Status: AC

## 2017-01-19 MED ORDER — SODIUM CHLORIDE 0.9% FLUSH: 10.0000 mL | Freq: Two times a day (BID) | INTRAVENOUS | Status: DC

## 2017-01-19 NOTE — Progress Notes (Signed)
Infectious Disease Long Term IV Antibiotic Orders  Diagnosis: MSSA bacteremia and pneumonia  Culture results Culture & Susceptibility   STAPHYLOCOCCUS AUREUS   Antibiotic Sensitivity Microscan Status  CIPROFLOXACIN Resistant >=8 RESISTANT Final  Method: MIC  CLINDAMYCIN Sensitive <=0.25 SENSITIVE Final  Method: MIC  ERYTHROMYCIN Sensitive <=0.25 SENSITIVE Final  Method: MIC  GENTAMICIN Sensitive <=0.5 SENSITIVE Final  Method: MIC  Inducible Clindamycin Sensitive NEGATIVE Final  Method: MIC  OXACILLIN Sensitive <=0.25 SENSITIVE Final  Method: MIC  RIFAMPIN Sensitive <=0.5 SENSITIVE Final  Method: MIC  TETRACYCLINE Sensitive <=1 SENSITIVE Final  Method: MIC  TRIMETH/SULFA Sensitive <=10 SENSITIVE Final  Method: MIC  VANCOMYCIN Sensitive 1 SENSITIVE Final  Method: MIC  Comments STAPHYLOCOCCUS AUREUS (MIC)   STAPHYLOCOCCUS AUREUS       Lab Results  Component Value Date   WBC 7.2 01/18/2017   HGB 7.3 (L) 01/18/2017   HCT 22.6 (L) 01/18/2017   MCV 90.6 01/18/2017   PLT 324 01/18/2017   Lab Results  Component Value Date   CREATININE 0.67 01/18/2017   BUN 12 01/18/2017   NA 138 01/18/2017   K 3.6 01/18/2017   CL 100 (L) 01/18/2017   CO2 31 01/18/2017   Allergies: No Known Allergies  Discharge antibiotics Cefazolin      2 grams every  8 hours  PICC Care per protocol Labs weekly while on IV antibiotics      CBC w diff   Creatinine  Planned duration of antibiotics 4 weeks  Stop date July 31st    Follow up clinic date TBD - 1-2 weeks  FAX weekly labs to [phone]  Leonel Ramsay, MD

## 2017-01-19 NOTE — Progress Notes (Signed)
[facility] INFECTIOUS DISEASE PROGRESS NOTE Date of Admission:  01/15/2017     ID: Theresa Vasquez is a 83 y.o. female with MSSA bacteremia and PNA Principal Problem:   Acute on chronic respiratory failure with hypoxia (HCC) Active Problems:   Pneumonia   Acute on chronic respiratory failure (HCC)   Subjective: No fevers, remains confused. On baseline O2  ROS  Unable to obtain  Medications:  Antibiotics Given (last 72 hours)    Date/Time Action Medication Dose Rate   01/16/17 2255 New Bag/Given   ceFAZolin (ANCEF) IVPB 2g/100 mL premix 2 g 200 mL/hr   01/17/17 3354 New Bag/Given   ceFAZolin (ANCEF) IVPB 2g/100 mL premix 2 g 200 mL/hr   01/17/17 1355 New Bag/Given   piperacillin-tazobactam (ZOSYN) IVPB 3.375 g 3.375 g 12.5 mL/hr   01/17/17 1941 New Bag/Given   ceFAZolin (ANCEF) IVPB 1 g/50 mL premix 1 g 100 mL/hr   01/18/17 0327 New Bag/Given   ceFAZolin (ANCEF) IVPB 1 g/50 mL premix 1 g 100 mL/hr   01/18/17 1255 New Bag/Given   ceFAZolin (ANCEF) IVPB 1 g/50 mL premix 1 g 100 mL/hr   01/18/17 1618 New Bag/Given   ceFAZolin (ANCEF) IVPB 1 g/50 mL premix 1 g 100 mL/hr   01/18/17 2021 New Bag/Given   ceFAZolin (ANCEF) IVPB 2g/100 mL premix 2 g 200 mL/hr   01/19/17 0511 New Bag/Given   ceFAZolin (ANCEF) IVPB 2g/100 mL premix 2 g 200 mL/hr   01/19/17 1232 New Bag/Given   ceFAZolin (ANCEF) IVPB 2g/100 mL premix 2 g 200 mL/hr     . amiodarone  100 mg Oral Daily  . aspirin EC  81 mg Oral Daily  . chlorhexidine  15 mL Mouth Rinse BID  . divalproex  250 mg Oral Q8H  . donepezil  10 mg Oral QHS  . ferrous sulfate  325 mg Oral BID WC  . furosemide  40 mg Oral Daily  . gabapentin  400 mg Oral BID  . heparin  5,000 Units Subcutaneous Q8H  . insulin aspart  0-15 Units Subcutaneous TID WC  . insulin aspart  0-5 Units Subcutaneous QHS  . levothyroxine  150 mcg Oral QAC breakfast  . mouth rinse  15 mL Mouth Rinse q12n4p  . mometasone-formoterol  2 puff Inhalation BID  .  pantoprazole  40 mg Oral BID  . QUEtiapine  25 mg Oral QHS  . sodium chloride flush  10-40 mL Intracatheter Q12H  . tiotropium  18 mcg Inhalation Daily  . venlafaxine XR  37.5 mg Oral Q breakfast    Objective: Vital signs in last 24 hours: Temp:  [97.6 F (36.4 C)-98.1 F (36.7 C)] 97.7 F (36.5 C) (07/03 1235) Pulse Rate:  [76-94] 94 (07/03 1235) Resp:  [16-18] 18 (07/03 1235) BP: (116-135)/(45-59) 116/48 (07/03 1235) SpO2:  [92 %-97 %] 94 % (07/03 1235) FiO2 (%):  [2 %] 2 % (07/02 1759) Constitutional:  Frail, on O2, awake and interactive HENT: [place]/AT, PERRLA, no scleral icterus Mouth/Throat: Oropharynx is clear and moist. No oropharyngeal exudate.  Cardiovascular: Normal rate, regular rhythm and normal heart sounds. 2/6 sm Pulmonary/Chest:bil rhonchi Neck = supple, no nuchal rigidity Abdominal: Soft. Bowel sounds are normal.  exhibits no distension. There is no tenderness.  Lymphadenopathy: no cervical adenopathy. No axillary adenopathy Neurological:alert and interactive Skin: Skin is warm and dry.chronic bil LE stasis changes and dry skin.  Psychiatric: a normal mood and affect.  behavior is normal.   Lab Results  Recent Labs  01/17/17 0531 01/17/17 2157 01/18/17 0402 01/19/17 1334  WBC 8.7  --  7.2  --   HGB 8.0*  --  7.3* 8.0*  HCT 24.9*  --  22.6*  --   NA 138 136 138  --   K 3.7 3.8 3.6  --   CL 102 98* 100*  --   CO2 31 31 31   --   BUN 12 13 12   --   CREATININE 0.72 0.63 0.67  --     Microbiology: Results for orders placed or performed during the hospital encounter of 01/15/17  Blood Culture (routine x 2)     Status: Abnormal   Collection Time: 01/15/17  1:39 PM  Result Value Ref Range Status   Specimen Description BLOOD BLOOD RIGHT FOREARM  Final   Special Requests   Final    BOTTLES DRAWN AEROBIC AND ANAEROBIC Blood Culture results may not be optimal due to an excessive volume of blood received in culture bottles   Culture  Setup Time   Final     GRAM POSITIVE COCCI IN BOTH AEROBIC AND ANAEROBIC BOTTLES CRITICAL RESULT CALLED TO, READ BACK BY AND VERIFIED WITHTheodis Shove @ [phone] 01/16/17 by [facility]    Culture (A)  Final    STAPHYLOCOCCUS AUREUS STAPHYLOCOCCUS SPECIES (COAGULASE NEGATIVE) THE SIGNIFICANCE OF ISOLATING THIS ORGANISM FROM A SINGLE SET OF BLOOD CULTURES WHEN MULTIPLE SETS ARE DRAWN IS UNCERTAIN. PLEASE NOTIFY THE MICROBIOLOGY DEPARTMENT WITHIN ONE WEEK IF SPECIATION AND SENSITIVITIES ARE REQUIRED. Performed at [facility], [place] [address], [place], [place] [address]    Report Status 01/18/2017 FINAL  Final   Organism ID, Bacteria STAPHYLOCOCCUS AUREUS  Final      Susceptibility   Staphylococcus aureus - MIC*    CIPROFLOXACIN >=8 RESISTANT Resistant     ERYTHROMYCIN <=0.25 SENSITIVE Sensitive     GENTAMICIN <=0.5 SENSITIVE Sensitive     OXACILLIN <=0.25 SENSITIVE Sensitive     TETRACYCLINE <=1 SENSITIVE Sensitive     VANCOMYCIN 1 SENSITIVE Sensitive     TRIMETH/SULFA <=10 SENSITIVE Sensitive     CLINDAMYCIN <=0.25 SENSITIVE Sensitive     RIFAMPIN <=0.5 SENSITIVE Sensitive     Inducible Clindamycin NEGATIVE Sensitive     * STAPHYLOCOCCUS AUREUS  Blood Culture (routine x 2)     Status: None (Preliminary result)   Collection Time: 01/15/17  1:39 PM  Result Value Ref Range Status   Specimen Description BLOOD BLOOD LEFT FOREARM  Final   Special Requests   Final    BOTTLES DRAWN AEROBIC AND ANAEROBIC Blood Culture results may not be optimal due to an excessive volume of blood received in culture bottles   Culture NO GROWTH 4 DAYS  Final   Report Status PENDING  Incomplete  Blood Culture ID Panel (Reflexed)     Status: Abnormal   Collection Time: 01/15/17  1:39 PM  Result Value Ref Range Status   Enterococcus species NOT DETECTED NOT DETECTED Final   Listeria monocytogenes NOT DETECTED NOT DETECTED Final   Staphylococcus species DETECTED (A) NOT DETECTED Final    Comment: CRITICAL RESULT CALLED TO, READ BACK BY  AND VERIFIED WITHTheodis Shove @ 2025 01/16/17 by [facility]    Staphylococcus aureus DETECTED (A) NOT DETECTED Final    Comment: Methicillin (oxacillin) susceptible Staphylococcus aureus (MSSA). Preferred therapy is anti staphylococcal beta lactam antibiotic (Cefazolin or Nafcillin), unless clinically contraindicated. CRITICAL RESULT CALLED TO, READ BACK BY AND VERIFIED WITH: Theodis Shove @ [phone] 01/16/17 by [facility]  Methicillin resistance NOT DETECTED NOT DETECTED Final   Streptococcus species NOT DETECTED NOT DETECTED Final   Streptococcus agalactiae NOT DETECTED NOT DETECTED Final   Streptococcus pneumoniae NOT DETECTED NOT DETECTED Final   Streptococcus pyogenes NOT DETECTED NOT DETECTED Final   Acinetobacter baumannii NOT DETECTED NOT DETECTED Final   Enterobacteriaceae species NOT DETECTED NOT DETECTED Final   Enterobacter cloacae complex NOT DETECTED NOT DETECTED Final   Escherichia coli NOT DETECTED NOT DETECTED Final   Klebsiella oxytoca NOT DETECTED NOT DETECTED Final   Klebsiella pneumoniae NOT DETECTED NOT DETECTED Final   Proteus species NOT DETECTED NOT DETECTED Final   Serratia marcescens NOT DETECTED NOT DETECTED Final   Haemophilus influenzae NOT DETECTED NOT DETECTED Final   Neisseria meningitidis NOT DETECTED NOT DETECTED Final   Pseudomonas aeruginosa NOT DETECTED NOT DETECTED Final   Candida albicans NOT DETECTED NOT DETECTED Final   Candida glabrata NOT DETECTED NOT DETECTED Final   Candida krusei NOT DETECTED NOT DETECTED Final   Candida parapsilosis NOT DETECTED NOT DETECTED Final   Candida tropicalis NOT DETECTED NOT DETECTED Final  MRSA PCR Screening     Status: None   Collection Time: 01/16/17  7:32 AM  Result Value Ref Range Status   MRSA by PCR NEGATIVE NEGATIVE Final    Comment:        The GeneXpert MRSA Assay (FDA approved for NASAL specimens only), is one component of a comprehensive MRSA colonization surveillance program. It is not intended to diagnose  MRSA infection nor to guide or monitor treatment for MRSA infections.   Culture, blood (Routine X 2) w Reflex to ID Panel     Status: None (Preliminary result)   Collection Time: 01/16/17  9:04 AM  Result Value Ref Range Status   Specimen Description BLOOD LEFT ANTECUBITAL  Final   Special Requests   Final    BOTTLES DRAWN AEROBIC AND ANAEROBIC Blood Culture adequate volume   Culture NO GROWTH 3 DAYS  Final   Report Status PENDING  Incomplete  Culture, blood (Routine X 2) w Reflex to ID Panel     Status: None (Preliminary result)   Collection Time: 01/16/17  9:04 AM  Result Value Ref Range Status   Specimen Description BLOOD RIGHT ANTECUBITAL  Final   Special Requests   Final    BOTTLES DRAWN AEROBIC AND ANAEROBIC Blood Culture results may not be optimal due to an excessive volume of blood received in culture bottles   Culture NO GROWTH 3 DAYS  Final   Report Status PENDING  Incomplete  Aerobic Culture (superficial specimen)     Status: None   Collection Time: 01/16/17 11:11 AM  Result Value Ref Range Status   Specimen Description WOUND ANKLE  Final   Special Requests NONE  Final   Gram Stain   Final    FEW WBC PRESENT, PREDOMINANTLY PMN ABUNDANT GRAM POSITIVE COCCI IN PAIRS FEW GRAM POSITIVE RODS Performed at [facility], [address], [place], [place] [address]    Culture MODERATE STAPHYLOCOCCUS AUREUS  Final   Report Status 01/18/2017 FINAL  Final   Organism ID, Bacteria STAPHYLOCOCCUS AUREUS  Final      Susceptibility   Staphylococcus aureus - MIC*    CIPROFLOXACIN >=8 RESISTANT Resistant     ERYTHROMYCIN <=0.25 SENSITIVE Sensitive     GENTAMICIN <=0.5 SENSITIVE Sensitive     OXACILLIN <=0.25 SENSITIVE Sensitive     TETRACYCLINE <=1 SENSITIVE Sensitive     VANCOMYCIN <=0.5 SENSITIVE Sensitive  TRIMETH/SULFA <=10 SENSITIVE Sensitive     CLINDAMYCIN <=0.25 SENSITIVE Sensitive     RIFAMPIN <=0.5 SENSITIVE Sensitive     Inducible Clindamycin NEGATIVE  Sensitive     * MODERATE STAPHYLOCOCCUS AUREUS    Studies/Results: Dg Chest Port 1 View  Result Date: 01/18/2017 CLINICAL DATA:  Congestive heart failure EXAM: PORTABLE CHEST 1 VIEW COMPARISON:  January 17, 2017 FINDINGS: There is persistent patchy interstitial and alveolar opacity, likely edema. There is cardiomegaly with pulmonary venous hypertension. There is aortic atherosclerosis. No adenopathy. No bone lesions. IMPRESSION: Persistent patchy interstitial and alveolar opacities, likely due to edema, although superimposed pneumonia in the right upper lobe cannot be excluded. Stable cardiomegaly. Overall no change from 1 day prior. There is aortic atherosclerosis. Electronically Signed   By: Lowella Grip III M.D.   On: 01/18/2017 07:14    Assessment/Plan: Theresa Vasquez is a 83 y.o. female with MSSA PNA and bacteremia. She was quite ill on admission and required transfer to ICU. TTE negative for vegetation.  FU bcx 6/30 negative.  Likely has primary MSSA PNA as source of bacteremia but could also have had a skin source with chronic bil le changes and small heel ulcer. With wound cx growing MSSA.  She is at mod risk of endocarditis with valvular dysfunction but given her frailily, O2, requirement and active PNA is a relatively high risk for TEE.   Recommendations Would rec 4 weeks IV ancef for MSSA bacteremia See abx order sheet Ok for DC when stable   Thank you very much for the consult. Will follow with you.  Cristan Hout P   01/19/2017, 4:29 PM

## 2017-01-19 NOTE — Discharge Summary (Signed)
[facility] NAME: Theresa Vasquez    MR#:  [id number]  DATE OF BIRTH:  05/05/1931  DATE OF ADMISSION:  01/15/2017 ADMITTING PHYSICIAN: Vaughan Basta, MD  DATE OF DISCHARGE: 01/19/2017  PRIMARY CARE PHYSICIAN: Valerie Roys, DO    ADMISSION DIAGNOSIS:  Elevated lactic acid level [R79.89] Sepsis, due to unspecified organism ([place]) [A41.9] Leukocytosis, unspecified type [D72.829] Community acquired pneumonia, unspecified laterality [J18.9]  DISCHARGE DIAGNOSIS:  Principal Problem:   Acute on chronic respiratory failure with hypoxia ([place]) Active Problems:   Pneumonia   Acute on chronic respiratory failure ([place])   SECONDARY DIAGNOSIS:   Past Medical History:  Diagnosis Date  . Adenomatous polyps   . Age-related osteoporosis without current pathological fracture   . Anemia   . Anxiety   . Atherosclerosis of aorta ([place])   . Atrial fibrillation with RVR ([place])   . Barrett's esophagus without dysplasia   . CHF (congestive heart failure) ([place])   . Colitis   . COPD (chronic obstructive pulmonary disease) ([place])   . Dementia   . Depression   . Diabetes mellitus type II, uncontrolled ([place])   . Diverticulitis   . Diverticulosis of intestine without perforation or abscess without bleeding   . Edema   . Essential hemorrhagic thrombocythemia ([place])   . Gastrointestinal hemorrhage   . High anion gap metabolic acidosis   . Hyperlipidemia   . Hypertension   . Hypertensive chronic kidney disease with stage 1 through stage 4 chronic kidney disease, or unspecified chronic kidney disease   . Hypothyroidism   . Hypoxemia   . Iron deficiency anemia   . Lactic acidosis   . Left bundle branch block   . Nonrheumatic mitral valve insufficiency   . Osteoarthritis   . Other psychoactive substance dependence, in remission ([place])   . Other specified symptoms and signs involving the circulatory and respiratory systems   . Paroxysmal  atrial fibrillation (HCC)    s/p cardioversion  . Personal history of peptic ulcer disease   . Pulmonary edema   . PVD (peripheral vascular disease) ([place]) 05/14/2015  . Recurrent pneumonia    history of  . Respiratory failure with hypoxia and hypercapnia ([place])   . Type 2 diabetes mellitus with diabetic chronic kidney disease ([place])   . Type 2 diabetes mellitus with diabetic polyneuropathy ([place])   . Vascular dementia without behavioral disturbance   . Vascular disorder of intestine [facility])     HOSPITAL COURSE:   83 year old female history of diabetes, vascular dementia, peripheral vascular disease, hypothyroidism, atrial fibrillation, anxiety/depression, COPD who presented to the hospital due to fever, leukocytosis and suspected have sepsis secondary to pneumonia/cellulitis.  1. Sepsis-patient met criteria on admission given leukocytosis, fever. Chest x-ray were suggestive of bilateral infiltrates. -Blood cultures were positive for MSSA.  Initially patient was treated with broad-spectrum IV antibiotics with vancomycin, Zosyn. Once blood cultures are back patient's antibiotics were narrowed down to IV Ancef, and patient is not being discharged on IV Ancef for an additional 3 weeks with stop date on 02/16/2017. -Patient had echocardiogram which showed no evidence of vegetations or acute endocarditis. Patient is presently afebrile and hemodynamically stable.  2. Pneumonia-source of patient's sepsis.  staph pneumonia given patient's bacteremia. -Initially patient treated with broad-spectrum IV antibiotics with vancomycin, Zosyn and then narrowed down to IV Ancef given MSSA bacteremia. Echocardiogram was negative for endocarditis. Patient has a PICC line now not being discharged on 3 weeks of IV Ancef. -  Appreciate infectious disease input.  3. Anemia of chronic disease - patient was transfused 1 unit of packed red blood cells while in the hospital and hemoglobin has remained stable now.  Hemoglobin on the day of discharge is 8.0.  4. Hypothyroidism- pt. Will continue Synthroid.  5. History of atrial fibrillation- remained rate controlled. Pt. Will Continue amiodarone. - not on long term anti-coagulation due to high fall risk.    6. COPD-no acute exacerbation - pt. Will continue Spiriva, Advair, albuterol nebs as needed.   7. GERD- pt. Will continue Protonix.  8. Depression- pt. Will continue Effexor.  9. Dementia - pt. Will cont. Aricept.   DISCHARGE CONDITIONS:   Stable  CONSULTS OBTAINED:  Treatment Team:  Leonel Ramsay, MD  DRUG ALLERGIES:  No Known Allergies  DISCHARGE MEDICATIONS:   Allergies as of 01/19/2017   No Known Allergies     Medication List    TAKE these medications   acetaminophen 500 MG tablet Commonly known as:  TYLENOL Take 1 tablet (500 mg total) by mouth every 4 (four) hours as needed for mild pain, fever or headache.   ADVAIR DISKUS 250-50 MCG/DOSE Aepb Generic drug:  Fluticasone-Salmeterol INHALE ONE PUFF TWICE DAILY   albuterol (2.5 MG/3ML) 0.083% nebulizer solution Commonly known as:  PROVENTIL Take 3 mLs (2.5 mg total) by nebulization every 4 (four) hours as needed for wheezing or shortness of breath.   amiodarone 100 MG tablet Commonly known as:  PACERONE Take 100 mg by mouth daily.   amLODipine 5 MG tablet Commonly known as:  NORVASC TAKE ONE TABLET TWICE DAILY   aspirin EC 81 MG tablet Take 81 mg by mouth daily.   ceFAZolin 2-4 GM/100ML-% IVPB Commonly known as:  ANCEF Inject 100 mLs (2 g total) into the vein every 8 (eight) hours.   ceFAZolin IVPB Commonly known as:  ANCEF Inject 2 g into the vein every 8 (eight) hours. Indication:  bacteremia Last Day of Therapy:  02/16/2017 Labs - Once weekly:  CBC/D and BMP, Labs - Every other week:  ESR and CRP   divalproex 250 MG DR tablet Commonly known as:  DEPAKOTE TAKE ONE TABLET BY MOUTH 3 TIMES DAILY   donepezil 10 MG tablet Commonly known as:   ARICEPT Take 1 tablet (10 mg total) by mouth at bedtime.   ferrous sulfate 325 (65 FE) MG tablet TAKE ONE TABLET BY MOUTH TWICE DAILY WITH A MEAL   furosemide 40 MG tablet Commonly known as:  LASIX TAKE ONE TABLET TWICE DAILY   gabapentin 400 MG capsule Commonly known as:  NEURONTIN Take 1 capsule (400 mg total) by mouth 2 (two) times daily.   levothyroxine 150 MCG tablet Commonly known as:  SYNTHROID, LEVOTHROID TAKE ONE (1) TABLET BY MOUTH EVERY DAY BEFORE BREAKFAST   lisinopril 20 MG tablet Commonly known as:  PRINIVIL,ZESTRIL TAKE ONE (1) TABLET EACH DAY   Melatonin 3 MG Tabs Take 1 tablet (3 mg total) by mouth at bedtime.   metoprolol tartrate 25 MG tablet Commonly known as:  LOPRESSOR Take 1 tablet by mouth every 12 (twelve) hours.   MICROLET LANCETS Misc TEST BLOOD GLUCOSE LEVEL ONCE DAILY   pantoprazole 40 MG tablet Commonly known as:  PROTONIX Take 40 mg by mouth daily.   QUEtiapine 25 MG tablet Commonly known as:  SEROQUEL Take 1 tablet (25 mg total) by mouth at bedtime.   senna-docusate 8.6-50 MG tablet Commonly known as:  Senokot-S Take 1 tablet by mouth at bedtime  as needed for mild constipation.   SPIRIVA HANDIHALER 18 MCG inhalation capsule Generic drug:  tiotropium INHALE CONTENTS OF ONE CAPSULE ONCE A DAY AS DIRECTED   venlafaxine XR 37.5 MG 24 hr capsule Commonly known as:  EFFEXOR-XR TAKE 1 CAPSULE BY MOUTH EVERY DAY WITH BREAKFAST   Vitamin D-3 1000 units Caps Take 1 capsule (1,000 Units total) by mouth daily.            Home Infusion Instuctions        Start     Ordered   01/19/17 0000  Home infusion instructions [facility] May follow [facility] Dosing Protocol; May administer Cathflo as needed to maintain patency of vascular access device.; Flushing of vascular access device: per [facility] Protocol: 0.9% NaCl pre/post medica...    Question Answer Comment  Instructions May follow [facility] Dosing Protocol   Instructions  May administer Cathflo as needed to maintain patency of vascular access device.   Instructions Flushing of vascular access device: per [facility] Protocol: 0.9% NaCl pre/post medication administration and prn patency; Heparin 100 u/ml, 19m for implanted ports and Heparin 10u/ml, 576mfor all other central venous catheters.   Instructions May follow [facility] Anaphylaxis Protocol for First Dose Administration in the home: 0.9% NaCl at 25-50 ml/hr to maintain IV access for protocol meds. Epinephrine 0.3 ml IV/IM PRN and Benadryl 25-50 IV/IM PRN s/s of anaphylaxis.   Instructions [facility] Infusion Coordinator (RN) to assist per patient IV care needs in the home PRN.      01/19/17 1538        DISCHARGE INSTRUCTIONS:   DIET:  Cardiac diet  DISCHARGE CONDITION:  Stable  ACTIVITY:  Activity as tolerated  OXYGEN:  Home Oxygen: Yes.     Oxygen Delivery: 2-3 liters/min via Patient connected to nasal cannula oxygen  DISCHARGE LOCATION:  Home with Home Health Nursing/PT/Aide   If you experience worsening of your admission symptoms, develop shortness of breath, life threatening emergency, suicidal or homicidal thoughts you must seek medical attention immediately by calling 911 or calling your MD immediately  if symptoms less severe.  You Must read complete instructions/literature along with all the possible adverse reactions/side effects for all the Medicines you take and that have been prescribed to you. Take any new Medicines after you have completely understood and accpet all the possible adverse reactions/side effects.   Please note  You were cared for by a hospitalist during your hospital stay. If you have any questions about your discharge medications or the care you received while you were in the hospital after you are discharged, you can call the unit and asked to speak with the hospitalist on call if the hospitalist that took care of you is not available. Once you are discharged, your  primary care physician will handle any further medical issues. Please note that NO REFILLS for any discharge medications will be authorized once you are discharged, as it is imperative that you return to your primary care physician (or establish a relationship with a primary care physician if you do not have one) for your aftercare needs so that they can reassess your need for medications and monitor your lab values.     Today   No acute events overnight.  Afebrile.  Son at bedside. Hg. Stable.    VITAL SIGNS:  Blood pressure (!) 116/48, pulse 94, temperature 97.7 F (36.5 C), temperature source Oral, resp. rate 18, height 5' 7"  (1.702 m), weight 90.7 kg (199 lb  15.3 oz), SpO2 94 %.  I/O:    Intake/Output Summary (Last 24 hours) at 01/19/17 1540 Last data filed at 01/19/17 1319  Gross per 24 hour  Intake              975 ml  Output                0 ml  Net              975 ml    PHYSICAL EXAMINATION:   GENERAL:  83 y.o.-year-old patient lying in bed in NAD.   EYES: Pupils equal, round, reactive to light and accommodation. No scleral icterus. Extraocular muscles intact.  HEENT: Head atraumatic, normocephalic. Oropharynx and nasopharynx clear.  NECK:  Supple, no jugular venous distention. No thyroid enlargement, no tenderness.  LUNGS: Normal breath sounds bilaterally, no wheezing, rales, rhonchi. + use of accessory muscles of respiration.  CARDIOVASCULAR: S1, S2 normal. No murmurs, rubs, or gallops.  ABDOMEN: Soft, nontender, nondistended. Bowel sounds present. No organomegaly or mass.  EXTREMITIES: No cyanosis, clubbing or edema b/l.   Signs of chronic venous stasis bilaterally, mild right lower extremity redness noted. NEUROLOGIC: Cranial nerves II through XII are intact. No focal Motor or sensory deficits b/l. Globally weak   PSYCHIATRIC: The patient is alert and oriented x 2.  SKIN: No obvious rash, lesion, stage I pressure ulcer on the medial part of the left foot  DATA  REVIEW:   CBC  Recent Labs Lab 01/18/17 0402 01/19/17 1334  WBC 7.2  --   HGB 7.3* 8.0*  HCT 22.6*  --   PLT 324  --     Chemistries   Recent Labs Lab 01/15/17 1339  01/17/17 2157 01/18/17 0402  NA 137  < > 136 138  K 4.8  < > 3.8 3.6  CL 100*  < > 98* 100*  CO2 25  < > 31 31  GLUCOSE 176*  < > 173* 176*  BUN 16  < > 13 12  CREATININE 0.81  < > 0.63 0.67  CALCIUM 8.2*  < > 7.8* 7.7*  MG  --   --  1.9  --   AST 45*  --   --   --   ALT 17  --   --   --   ALKPHOS 48  --   --   --   BILITOT 0.7  --   --   --   < > = values in this interval not displayed.  Cardiac Enzymes No results for input(s): TROPONINI in the last 168 hours.  Microbiology Results  Results for orders placed or performed during the hospital encounter of 01/15/17  Blood Culture (routine x 2)     Status: Abnormal   Collection Time: 01/15/17  1:39 PM  Result Value Ref Range Status   Specimen Description BLOOD BLOOD RIGHT FOREARM  Final   Special Requests   Final    BOTTLES DRAWN AEROBIC AND ANAEROBIC Blood Culture results may not be optimal due to an excessive volume of blood received in culture bottles   Culture  Setup Time   Final    GRAM POSITIVE COCCI IN BOTH AEROBIC AND ANAEROBIC BOTTLES CRITICAL RESULT CALLED TO, READ BACK BY AND VERIFIED WITHTheodis Shove @ [phone] 01/16/17 by [facility]    Culture (A)  Final    STAPHYLOCOCCUS AUREUS STAPHYLOCOCCUS SPECIES (COAGULASE NEGATIVE) THE SIGNIFICANCE OF ISOLATING THIS ORGANISM FROM A SINGLE SET OF BLOOD CULTURES WHEN MULTIPLE SETS  ARE DRAWN IS UNCERTAIN. PLEASE NOTIFY THE MICROBIOLOGY DEPARTMENT WITHIN ONE WEEK IF SPECIATION AND SENSITIVITIES ARE REQUIRED. Performed at [facility], [place] [address], [place], [place] [address]    Report Status 01/18/2017 FINAL  Final   Organism ID, Bacteria STAPHYLOCOCCUS AUREUS  Final      Susceptibility   Staphylococcus aureus - MIC*    CIPROFLOXACIN >=8 RESISTANT Resistant     ERYTHROMYCIN <=0.25 SENSITIVE  Sensitive     GENTAMICIN <=0.5 SENSITIVE Sensitive     OXACILLIN <=0.25 SENSITIVE Sensitive     TETRACYCLINE <=1 SENSITIVE Sensitive     VANCOMYCIN 1 SENSITIVE Sensitive     TRIMETH/SULFA <=10 SENSITIVE Sensitive     CLINDAMYCIN <=0.25 SENSITIVE Sensitive     RIFAMPIN <=0.5 SENSITIVE Sensitive     Inducible Clindamycin NEGATIVE Sensitive     * STAPHYLOCOCCUS AUREUS  Blood Culture (routine x 2)     Status: None (Preliminary result)   Collection Time: 01/15/17  1:39 PM  Result Value Ref Range Status   Specimen Description BLOOD BLOOD LEFT FOREARM  Final   Special Requests   Final    BOTTLES DRAWN AEROBIC AND ANAEROBIC Blood Culture results may not be optimal due to an excessive volume of blood received in culture bottles   Culture NO GROWTH 4 DAYS  Final   Report Status PENDING  Incomplete  Blood Culture ID Panel (Reflexed)     Status: Abnormal   Collection Time: 01/15/17  1:39 PM  Result Value Ref Range Status   Enterococcus species NOT DETECTED NOT DETECTED Final   Listeria monocytogenes NOT DETECTED NOT DETECTED Final   Staphylococcus species DETECTED (A) NOT DETECTED Final    Comment: CRITICAL RESULT CALLED TO, READ BACK BY AND VERIFIED WITHTheodis Shove @ [phone] 01/16/17 by [facility]    Staphylococcus aureus DETECTED (A) NOT DETECTED Final    Comment: Methicillin (oxacillin) susceptible Staphylococcus aureus (MSSA). Preferred therapy is anti staphylococcal beta lactam antibiotic (Cefazolin or Nafcillin), unless clinically contraindicated. CRITICAL RESULT CALLED TO, READ BACK BY AND VERIFIED WITH: Theodis Shove @ [phone] 01/16/17 by [facility]    Methicillin resistance NOT DETECTED NOT DETECTED Final   Streptococcus species NOT DETECTED NOT DETECTED Final   Streptococcus agalactiae NOT DETECTED NOT DETECTED Final   Streptococcus pneumoniae NOT DETECTED NOT DETECTED Final   Streptococcus pyogenes NOT DETECTED NOT DETECTED Final   Acinetobacter baumannii NOT DETECTED NOT DETECTED Final    Enterobacteriaceae species NOT DETECTED NOT DETECTED Final   Enterobacter cloacae complex NOT DETECTED NOT DETECTED Final   Escherichia coli NOT DETECTED NOT DETECTED Final   Klebsiella oxytoca NOT DETECTED NOT DETECTED Final   Klebsiella pneumoniae NOT DETECTED NOT DETECTED Final   Proteus species NOT DETECTED NOT DETECTED Final   Serratia marcescens NOT DETECTED NOT DETECTED Final   Haemophilus influenzae NOT DETECTED NOT DETECTED Final   Neisseria meningitidis NOT DETECTED NOT DETECTED Final   Pseudomonas aeruginosa NOT DETECTED NOT DETECTED Final   Candida albicans NOT DETECTED NOT DETECTED Final   Candida glabrata NOT DETECTED NOT DETECTED Final   Candida krusei NOT DETECTED NOT DETECTED Final   Candida parapsilosis NOT DETECTED NOT DETECTED Final   Candida tropicalis NOT DETECTED NOT DETECTED Final  MRSA PCR Screening     Status: None   Collection Time: 01/16/17  7:32 AM  Result Value Ref Range Status   MRSA by PCR NEGATIVE NEGATIVE Final    Comment:        The GeneXpert MRSA Assay (FDA  approved for NASAL specimens only), is one component of a comprehensive MRSA colonization surveillance program. It is not intended to diagnose MRSA infection nor to guide or monitor treatment for MRSA infections.   Culture, blood (Routine X 2) w Reflex to ID Panel     Status: None (Preliminary result)   Collection Time: 01/16/17  9:04 AM  Result Value Ref Range Status   Specimen Description BLOOD LEFT ANTECUBITAL  Final   Special Requests   Final    BOTTLES DRAWN AEROBIC AND ANAEROBIC Blood Culture adequate volume   Culture NO GROWTH 3 DAYS  Final   Report Status PENDING  Incomplete  Culture, blood (Routine X 2) w Reflex to ID Panel     Status: None (Preliminary result)   Collection Time: 01/16/17  9:04 AM  Result Value Ref Range Status   Specimen Description BLOOD RIGHT ANTECUBITAL  Final   Special Requests   Final    BOTTLES DRAWN AEROBIC AND ANAEROBIC Blood Culture results may not  be optimal due to an excessive volume of blood received in culture bottles   Culture NO GROWTH 3 DAYS  Final   Report Status PENDING  Incomplete  Aerobic Culture (superficial specimen)     Status: None   Collection Time: 01/16/17 11:11 AM  Result Value Ref Range Status   Specimen Description WOUND ANKLE  Final   Special Requests NONE  Final   Gram Stain   Final    FEW WBC PRESENT, PREDOMINANTLY PMN ABUNDANT GRAM POSITIVE COCCI IN PAIRS FEW GRAM POSITIVE RODS Performed at [facility], [address], [place], [place] [address]    Culture MODERATE STAPHYLOCOCCUS AUREUS  Final   Report Status 01/18/2017 FINAL  Final   Organism ID, Bacteria STAPHYLOCOCCUS AUREUS  Final      Susceptibility   Staphylococcus aureus - MIC*    CIPROFLOXACIN >=8 RESISTANT Resistant     ERYTHROMYCIN <=0.25 SENSITIVE Sensitive     GENTAMICIN <=0.5 SENSITIVE Sensitive     OXACILLIN <=0.25 SENSITIVE Sensitive     TETRACYCLINE <=1 SENSITIVE Sensitive     VANCOMYCIN <=0.5 SENSITIVE Sensitive     TRIMETH/SULFA <=10 SENSITIVE Sensitive     CLINDAMYCIN <=0.25 SENSITIVE Sensitive     RIFAMPIN <=0.5 SENSITIVE Sensitive     Inducible Clindamycin NEGATIVE Sensitive     * MODERATE STAPHYLOCOCCUS AUREUS    RADIOLOGY:  Dg Chest Port 1 View  Result Date: 01/18/2017 CLINICAL DATA:  Congestive heart failure EXAM: PORTABLE CHEST 1 VIEW COMPARISON:  January 17, 2017 FINDINGS: There is persistent patchy interstitial and alveolar opacity, likely edema. There is cardiomegaly with pulmonary venous hypertension. There is aortic atherosclerosis. No adenopathy. No bone lesions. IMPRESSION: Persistent patchy interstitial and alveolar opacities, likely due to edema, although superimposed pneumonia in the right upper lobe cannot be excluded. Stable cardiomegaly. Overall no change from 1 day prior. There is aortic atherosclerosis. Electronically Signed   By: Lowella Grip III M.D.   On: 01/18/2017 07:14      Management plans  discussed with the patient, family and they are in agreement.  CODE STATUS:     Code Status Orders        Start     Ordered   01/15/17 1833  Full code  Continuous     01/15/17 1832    \Advance Directive Documentation     Most Recent Value  Type of Advance Directive  Healthcare Power of Attorney [[place] ]  Pre-existing out of facility DNR order (  yellow form or pink MOST form)  -  "MOST" Form in Place?  -      TOTAL TIME TAKING CARE OF THIS PATIENT: 40 minutes.    Henreitta Leber M.D on 01/19/2017 at 3:40 PM  Between 7am to 6pm - Pager - [phone]  After 6pm go to [email or web address] - [employer] [facility]  Office  [phone]  CC: Primary care physician; Valerie Roys, DO

## 2017-01-19 NOTE — Progress Notes (Signed)
Physical Therapy Treatment Patient Details Name: Theresa Vasquez MRN: [id number] DOB: 1931/02/01 Today's Date: 01/19/2017    History of Present Illness presented to ER secondary to fever, hypoxia; admitted with sepsis related to bilat PNA, R LE cellulitis.  Hospital course significant for transfer to CCU for BiPAP therapy, transferred to med-surg and weaned to 2-3L via [place] at this time.    PT Comments    Chart reviewed.  HgB 7.3, HCT 22.6.  Pt awake and alert.  Stated she feels well and wanting to participate in session today.  Stated she is anticipating discharge to home today.  Personal care aide in for session. Participated in exercises as described below.  To edge of bed with mod a x 1.  She was able to stand with mod a x 2 today and take a few steps in place.  She agrees to up in recliner to assess transfer skills.  She required +2 assist with walker and was unable to turn fully before sitting in chair.  Discussed transfer status with private aide.  She stated someone is there 24 hours a day to assist.  Encouraged +2 assist for tranfers until strength increases for pt and caregiver safety.  They have a wheelchair at home which she will need at this time for mobility in the home.    While SNF is recommended, pt and private aide state they will not do SNF and will manage her recovery at home.  If they continue to decline Rehab, HHPT and +2 assist for transfers is recommended.   Follow Up Recommendations  SNF     Equipment Recommendations       Recommendations for Other Services       Precautions / Restrictions Precautions Precautions: Fall Restrictions Weight Bearing Restrictions: No    Mobility  Bed Mobility Overal bed mobility: Needs Assistance Bed Mobility: Supine to Sit     Supine to sit: Mod assist        Transfers Overall transfer level: Needs assistance Equipment used: Rolling walker (2 wheeled) Transfers: Sit to/from Stand Sit to Stand: Mod assist;+2 physical  assistance;From elevated surface         General transfer comment: has lift chair at home, bed height raised to simulate height  Ambulation/Gait Ambulation/Gait assistance: Mod assist;+2 physical assistance Ambulation Distance (Feet): 2 Feet Assistive device: Rolling walker (2 wheeled) Gait Pattern/deviations: Step-to pattern   Gait velocity interpretation: <1.8 ft/sec, indicative of risk for recurrent falls     Stairs            Wheelchair Mobility    Modified Rankin (Stroke Patients Only)       Balance Overall balance assessment: Needs assistance Sitting-balance support: No upper extremity supported;Feet supported Sitting balance-Leahy Scale: Fair     Standing balance support: Bilateral upper extremity supported Standing balance-Leahy Scale: Poor                              Cognition Arousal/Alertness: Awake/alert Behavior During Therapy: WFL for tasks assessed/performed Overall Cognitive Status: Within Functional Limits for tasks assessed                                        Exercises Other Exercises Other Exercises: supine ankle pumps, heel slides, ab/add and SLR x 10 AAROM with L heel protection against sheering Other Exercises: incontinent of urine,  full assist for care    General Comments        Pertinent Vitals/Pain Pain Assessment: No/denies pain    Home Living                      Prior Function            PT Goals (current goals can now be found in the care plan section) Progress towards PT goals: Progressing toward goals    Frequency    Min 2X/week      PT Plan Current plan remains appropriate    Co-evaluation              AM-PAC PT "6 Clicks" Daily Activity  Outcome Measure  Difficulty turning over in bed (including adjusting bedclothes, sheets and blankets)?: Total Difficulty moving from lying on back to sitting on the side of the bed? : Total Difficulty sitting down on  and standing up from a chair with arms (e.g., wheelchair, bedside commode, etc,.)?: Total Help needed moving to and from a bed to chair (including a wheelchair)?: Total Help needed walking in hospital room?: Total Help needed climbing 3-5 steps with a railing? : Total 6 Click Score: 6    End of Session Equipment Utilized During Treatment: Gait belt;Oxygen Activity Tolerance: Patient limited by fatigue Patient left: in chair;with chair alarm set;with nursing/sitter in room;with call bell/phone within reach;with family/visitor present         Time: 0539-7673 PT Time Calculation (min) (ACUTE ONLY): 29 min  Charges:  $Therapeutic Exercise: 8-22 mins $Therapeutic Activity: 8-22 mins                    G Codes:       Chesley Noon, PTA 01/19/17, 11:13 AM

## 2017-01-19 NOTE — Care Management (Signed)
Patient to discharge home today.  Patient admitted with sepsis.  Patient lives at home with 24 hour care giver.  Patient has local family that lives locally for support.  Patient open with [facility] for home health services.  Patient has chronic O2, nebulizer, walker, and shower seat in the home.  Patient to discharge with PICC line and IV antibiotics.  PT has assessed patient and recommended SNF.  [name] Nicole [facility] wishes for patient to return home with 24 hour care.  [facility] to provide resumption of home health services at discharge.  [facility] to provide IV medication.  Tim with [facility] and Corene Cornea with [facility] aware of referral and discharge plan.  Patient to receive evening dose of IV antibiotic prior to discharge.  Start of care for home health to be tomorrow between 8-10 am.  Son to bring portable O2 tank for discharge.  RNCM signing off.

## 2017-01-19 NOTE — Progress Notes (Signed)
PHARMACY CONSULT NOTE FOR:  OUTPATIENT  PARENTERAL ANTIBIOTIC THERAPY (OPAT)  Indication: Bacteremia  Regimen: Cefazolin 2 g IV q8 hours   End date: 02/16/2017  IV antibiotic discharge orders are pended. To discharging provider:  please sign these orders via discharge navigator,  Select New Orders & click on the button choice - Manage This Unsigned Work.     Thank you for allowing pharmacy to be a part of this patient's care.  Kourtney Montesinos D 01/19/2017, 2:09 PM

## 2017-01-19 NOTE — Progress Notes (Signed)
Peripherally Inserted Central Catheter/Midline Placement  The IV Nurse has discussed with the patient and/or persons authorized to consent for the patient, the purpose of this procedure and the potential benefits and risks involved with this procedure.  The benefits include less needle sticks, lab draws from the catheter, and the patient may be discharged home with the catheter. Risks include, but not limited to, infection, bleeding, blood clot (thrombus formation), and puncture of an artery; nerve damage and irregular heartbeat and possibility to perform a PICC exchange if needed/ordered by physician.  Alternatives to this procedure were also discussed.  Bard Power PICC patient education guide, fact sheet on infection prevention and patient information card has been provided to patient /or left at bedside.    PICC/Midline Placement Documentation  PICC Single Lumen 07/62/26 PICC Right Basilic 41 cm 0 cm (Active)  Indication for Insertion or Continuance of Line Prolonged intravenous therapies 01/19/2017  2:00 PM  Exposed Catheter (cm) 0 cm 01/19/2017  2:00 PM  Dressing Change Due 01/26/17 01/19/2017  2:00 PM       Lynwood Dawley 01/19/2017, 2:37 PM

## 2017-01-19 NOTE — Clinical Social Work Note (Signed)
Clinical Social Work Assessment  Patient Details  Name: Theresa Vasquez MRN: [id number] Date of Birth: 12-25-1930  Date of referral:  01/19/17               Reason for consult:  Facility Placement                Permission sought to share information with:  Family Supports Permission granted to share information::  Yes, Verbal Permission Granted  Name::        Agency::     Relationship::     Contact Information:     Housing/Transportation Living arrangements for the past 2 months:  Single Family Home Source of Information:  Patient, Adult Children Patient Interpreter Needed:  None Criminal Activity/Legal Involvement Pertinent to Current Situation/Hospitalization:  No - Comment as needed Significant Relationships:  Adult Children Lives with:  Adult Children Do you feel safe going back to the place where you live?  Yes Need for family participation in patient care:  Yes (Comment)  Care giving concerns:  Patient resides at home with her family.   Social Worker assessment / plan:  CSW consulted for possible placement. CSW reviewed medical record and discovered that PT not only assessed and recommended STR but the ID physician has recommended 2-4 weeks of IV ancef. CSW was approached by patient's son to discuss discharge planning. CSW met with patient, patient's son, and patient's daughter in law this morning and discussed role and purpose of visit. [name] spoke and stated that they don't think that they are going to want to place patient in a facility and would rather take patient back home. Patient's daughter in law stated that she can learn how to administer the IV abx and that home health is already coming out to attend to patient's foot wound. [name] stated patient was in [facility] in the past and it did not go well. CSW confirmed the plan to take patient home. CSW has notified the RN CM.  Employment status:  Retired Nurse, adult PT  Recommendations:  [place] / Referral to community resources:     Patient/Family's Response to care:  Patient and family expressed appreciation for CSW assistance.  Patient/Family's Understanding of and Emotional Response to Diagnosis, Current Treatment, and Prognosis:  Patient is in agreement with plan for her to return home at discharge.  Emotional Assessment Appearance:  Appears stated age Attitude/Demeanor/Rapport:   (pleasant and cooperative) Affect (typically observed):  Adaptable, Accepting, Happy, Calm, Pleasant Orientation:  Oriented to Self, Oriented to Place, Oriented to  Time, Oriented to Situation Alcohol / Substance use:  Not Applicable Psych involvement (Current and /or in the community):  No (Comment)  Discharge Needs  Concerns to be addressed:  No discharge needs identified Readmission within the last 30 days:  No Current discharge risk:  None Barriers to Discharge:      Shela Leff, LCSW 01/19/2017, 10:30 AM

## 2017-01-19 NOTE — Care Management Important Message (Signed)
Important Message  Patient Details  Name: Theresa Vasquez MRN: [id number] Date of Birth: 11-14-1930   Medicare Important Message Given:  Yes    Beverly Sessions, RN 01/19/2017, 4:04 PM

## 2017-01-19 NOTE — Progress Notes (Signed)
Pt given last dose of heparin through PICC line. Pt changed into cloth gown. Pt place in wheelchair. Pt Son received discharge instructions along with paper work from Prior Therapist, sports. Pt son stated that he understood everything and had no further questions. Primary RN along with another RN assisted pt in car.

## 2017-01-20 ENCOUNTER — Emergency Department: Payer: Medicare Other

## 2017-01-20 ENCOUNTER — Inpatient Hospital Stay
Admission: EM | Admit: 2017-01-20 | Discharge: 2017-02-17 | DRG: 870 | Disposition: E | Payer: Medicare Other | Attending: Specialist | Admitting: Specialist

## 2017-01-20 DIAGNOSIS — I82622 Acute embolism and thrombosis of deep veins of left upper extremity: Secondary | ICD-10-CM | POA: Diagnosis present

## 2017-01-20 DIAGNOSIS — G253 Myoclonus: Secondary | ICD-10-CM | POA: Diagnosis present

## 2017-01-20 DIAGNOSIS — R6521 Severe sepsis with septic shock: Secondary | ICD-10-CM | POA: Diagnosis present

## 2017-01-20 DIAGNOSIS — A419 Sepsis, unspecified organism: Secondary | ICD-10-CM | POA: Diagnosis present

## 2017-01-20 DIAGNOSIS — E1142 Type 2 diabetes mellitus with diabetic polyneuropathy: Secondary | ICD-10-CM | POA: Diagnosis present

## 2017-01-20 DIAGNOSIS — J9602 Acute respiratory failure with hypercapnia: Secondary | ICD-10-CM | POA: Diagnosis not present

## 2017-01-20 DIAGNOSIS — J9811 Atelectasis: Secondary | ICD-10-CM | POA: Diagnosis present

## 2017-01-20 DIAGNOSIS — K7460 Unspecified cirrhosis of liver: Secondary | ICD-10-CM | POA: Diagnosis present

## 2017-01-20 DIAGNOSIS — J9601 Acute respiratory failure with hypoxia: Secondary | ICD-10-CM

## 2017-01-20 DIAGNOSIS — J15211 Pneumonia due to Methicillin susceptible Staphylococcus aureus: Secondary | ICD-10-CM | POA: Diagnosis not present

## 2017-01-20 DIAGNOSIS — I130 Hypertensive heart and chronic kidney disease with heart failure and stage 1 through stage 4 chronic kidney disease, or unspecified chronic kidney disease: Secondary | ICD-10-CM | POA: Diagnosis present

## 2017-01-20 DIAGNOSIS — J9622 Acute and chronic respiratory failure with hypercapnia: Secondary | ICD-10-CM | POA: Diagnosis present

## 2017-01-20 DIAGNOSIS — Z9981 Dependence on supplemental oxygen: Secondary | ICD-10-CM

## 2017-01-20 DIAGNOSIS — Z87891 Personal history of nicotine dependence: Secondary | ICD-10-CM | POA: Diagnosis not present

## 2017-01-20 DIAGNOSIS — Z6832 Body mass index (BMI) 32.0-32.9, adult: Secondary | ICD-10-CM

## 2017-01-20 DIAGNOSIS — J1520 Pneumonia due to staphylococcus, unspecified: Secondary | ICD-10-CM | POA: Diagnosis present

## 2017-01-20 DIAGNOSIS — E1151 Type 2 diabetes mellitus with diabetic peripheral angiopathy without gangrene: Secondary | ICD-10-CM | POA: Diagnosis present

## 2017-01-20 DIAGNOSIS — I4581 Long QT syndrome: Secondary | ICD-10-CM | POA: Diagnosis present

## 2017-01-20 DIAGNOSIS — Z515 Encounter for palliative care: Secondary | ICD-10-CM | POA: Diagnosis not present

## 2017-01-20 DIAGNOSIS — F329 Major depressive disorder, single episode, unspecified: Secondary | ICD-10-CM | POA: Diagnosis present

## 2017-01-20 DIAGNOSIS — Z833 Family history of diabetes mellitus: Secondary | ICD-10-CM

## 2017-01-20 DIAGNOSIS — N179 Acute kidney failure, unspecified: Secondary | ICD-10-CM | POA: Diagnosis not present

## 2017-01-20 DIAGNOSIS — Z7401 Bed confinement status: Secondary | ICD-10-CM

## 2017-01-20 DIAGNOSIS — Z7982 Long term (current) use of aspirin: Secondary | ICD-10-CM

## 2017-01-20 DIAGNOSIS — N182 Chronic kidney disease, stage 2 (mild): Secondary | ICD-10-CM | POA: Diagnosis present

## 2017-01-20 DIAGNOSIS — I482 Chronic atrial fibrillation: Secondary | ICD-10-CM | POA: Diagnosis present

## 2017-01-20 DIAGNOSIS — I447 Left bundle-branch block, unspecified: Secondary | ICD-10-CM | POA: Diagnosis present

## 2017-01-20 DIAGNOSIS — I480 Paroxysmal atrial fibrillation: Secondary | ICD-10-CM | POA: Diagnosis present

## 2017-01-20 DIAGNOSIS — Z7951 Long term (current) use of inhaled steroids: Secondary | ICD-10-CM

## 2017-01-20 DIAGNOSIS — E039 Hypothyroidism, unspecified: Secondary | ICD-10-CM | POA: Diagnosis present

## 2017-01-20 DIAGNOSIS — K219 Gastro-esophageal reflux disease without esophagitis: Secondary | ICD-10-CM | POA: Diagnosis present

## 2017-01-20 DIAGNOSIS — J810 Acute pulmonary edema: Secondary | ICD-10-CM | POA: Diagnosis not present

## 2017-01-20 DIAGNOSIS — F0150 Vascular dementia without behavioral disturbance: Secondary | ICD-10-CM | POA: Diagnosis present

## 2017-01-20 DIAGNOSIS — J189 Pneumonia, unspecified organism: Secondary | ICD-10-CM

## 2017-01-20 DIAGNOSIS — Z7189 Other specified counseling: Secondary | ICD-10-CM | POA: Diagnosis not present

## 2017-01-20 DIAGNOSIS — J9621 Acute and chronic respiratory failure with hypoxia: Secondary | ICD-10-CM | POA: Diagnosis present

## 2017-01-20 DIAGNOSIS — R609 Edema, unspecified: Secondary | ICD-10-CM

## 2017-01-20 DIAGNOSIS — J9690 Respiratory failure, unspecified, unspecified whether with hypoxia or hypercapnia: Secondary | ICD-10-CM

## 2017-01-20 DIAGNOSIS — I5033 Acute on chronic diastolic (congestive) heart failure: Secondary | ICD-10-CM | POA: Diagnosis present

## 2017-01-20 DIAGNOSIS — I080 Rheumatic disorders of both mitral and aortic valves: Secondary | ICD-10-CM | POA: Diagnosis present

## 2017-01-20 DIAGNOSIS — I959 Hypotension, unspecified: Secondary | ICD-10-CM | POA: Diagnosis not present

## 2017-01-20 DIAGNOSIS — E669 Obesity, unspecified: Secondary | ICD-10-CM | POA: Diagnosis present

## 2017-01-20 DIAGNOSIS — E872 Acidosis: Secondary | ICD-10-CM | POA: Diagnosis present

## 2017-01-20 DIAGNOSIS — Z79899 Other long term (current) drug therapy: Secondary | ICD-10-CM

## 2017-01-20 DIAGNOSIS — E1122 Type 2 diabetes mellitus with diabetic chronic kidney disease: Secondary | ICD-10-CM | POA: Diagnosis present

## 2017-01-20 DIAGNOSIS — F419 Anxiety disorder, unspecified: Secondary | ICD-10-CM | POA: Diagnosis present

## 2017-01-20 DIAGNOSIS — E1165 Type 2 diabetes mellitus with hyperglycemia: Secondary | ICD-10-CM | POA: Diagnosis present

## 2017-01-20 DIAGNOSIS — J449 Chronic obstructive pulmonary disease, unspecified: Secondary | ICD-10-CM | POA: Diagnosis present

## 2017-01-20 DIAGNOSIS — Z66 Do not resuscitate: Secondary | ICD-10-CM | POA: Diagnosis present

## 2017-01-20 DIAGNOSIS — D509 Iron deficiency anemia, unspecified: Secondary | ICD-10-CM | POA: Diagnosis present

## 2017-01-20 DIAGNOSIS — G9341 Metabolic encephalopathy: Secondary | ICD-10-CM | POA: Diagnosis present

## 2017-01-20 DIAGNOSIS — D649 Anemia, unspecified: Secondary | ICD-10-CM | POA: Diagnosis present

## 2017-01-20 DIAGNOSIS — R0902 Hypoxemia: Secondary | ICD-10-CM

## 2017-01-20 DIAGNOSIS — Y95 Nosocomial condition: Secondary | ICD-10-CM | POA: Diagnosis present

## 2017-01-20 DIAGNOSIS — J9600 Acute respiratory failure, unspecified whether with hypoxia or hypercapnia: Secondary | ICD-10-CM

## 2017-01-20 DIAGNOSIS — I9581 Postprocedural hypotension: Secondary | ICD-10-CM | POA: Diagnosis present

## 2017-01-20 DIAGNOSIS — Z90710 Acquired absence of both cervix and uterus: Secondary | ICD-10-CM

## 2017-01-20 DIAGNOSIS — R579 Shock, unspecified: Secondary | ICD-10-CM | POA: Diagnosis not present

## 2017-01-20 DIAGNOSIS — E785 Hyperlipidemia, unspecified: Secondary | ICD-10-CM | POA: Diagnosis present

## 2017-01-20 LAB — BLOOD GAS, ARTERIAL
ACID-BASE EXCESS: 2.3 mmol/L — AB (ref 0.0–2.0)
BICARBONATE: 29.2 mmol/L — AB (ref 20.0–28.0)
FIO2: 1
LHR: 18 {breaths}/min
O2 SAT: 94.9 %
PCO2 ART: 58 mmHg — AB (ref 32.0–48.0)
PEEP: 10 cmH2O
Patient temperature: 37
VT: 0.5 mL
pH, Arterial: 7.31 — ABNORMAL LOW (ref 7.350–7.450)
pO2, Arterial: 82 mmHg — ABNORMAL LOW (ref 83.0–108.0)

## 2017-01-20 LAB — CBC WITH DIFFERENTIAL/PLATELET
BASOS PCT: 0 %
Band Neutrophils: 0 %
Basophils Absolute: 0 10*3/uL (ref 0–0.1)
Blasts: 0 %
EOS PCT: 4 %
Eosinophils Absolute: 0.8 10*3/uL — ABNORMAL HIGH (ref 0–0.7)
HEMATOCRIT: 28.8 % — AB (ref 35.0–47.0)
Hemoglobin: 8.6 g/dL — ABNORMAL LOW (ref 12.0–16.0)
LYMPHS PCT: 47 %
Lymphs Abs: 9.1 10*3/uL — ABNORMAL HIGH (ref 1.0–3.6)
MCH: 28 pg (ref 26.0–34.0)
MCHC: 29.8 g/dL — AB (ref 32.0–36.0)
MCV: 93.8 fL (ref 80.0–100.0)
MONO ABS: 2.3 10*3/uL — AB (ref 0.2–0.9)
Metamyelocytes Relative: 1 %
Monocytes Relative: 12 %
Myelocytes: 0 %
NEUTROS PCT: 36 %
NRBC: 0 /100{WBCs}
Neutro Abs: 7.1 10*3/uL — ABNORMAL HIGH (ref 1.4–6.5)
OTHER: 0 %
PROMYELOCYTES ABS: 0 %
Platelets: 652 10*3/uL — ABNORMAL HIGH (ref 150–440)
RBC: 3.07 MIL/uL — ABNORMAL LOW (ref 3.80–5.20)
RDW: 16.1 % — AB (ref 11.5–14.5)
WBC: 19.3 10*3/uL — ABNORMAL HIGH (ref 3.6–11.0)

## 2017-01-20 LAB — URINALYSIS, COMPLETE (UACMP) WITH MICROSCOPIC
Bilirubin Urine: NEGATIVE
Glucose, UA: >=500 mg/dL — AB
Hgb urine dipstick: NEGATIVE
Ketones, ur: NEGATIVE mg/dL
Leukocytes, UA: NEGATIVE
NITRITE: NEGATIVE
PROTEIN: 100 mg/dL — AB
SQUAMOUS EPITHELIAL / LPF: NONE SEEN
Specific Gravity, Urine: 1.013 (ref 1.005–1.030)
pH: 5 (ref 5.0–8.0)

## 2017-01-20 LAB — TROPONIN I: Troponin I: <0.03 ng/mL (ref <0.03)

## 2017-01-20 LAB — BASIC METABOLIC PANEL
ANION GAP: 12 (ref 5–15)
BUN: 14 mg/dL (ref 6–20)
CO2: 29 mmol/L (ref 22–32)
Calcium: 8.1 mg/dL — ABNORMAL LOW (ref 8.9–10.3)
Chloride: 97 mmol/L — ABNORMAL LOW (ref 101–111)
Creatinine, Ser: 1 mg/dL (ref 0.44–1.00)
GFR calc Af Amer: 58 mL/min — ABNORMAL LOW (ref >60)
GFR, EST NON AFRICAN AMERICAN: 50 mL/min — AB (ref >60)
GLUCOSE: 377 mg/dL — AB (ref 65–99)
POTASSIUM: 4.1 mmol/L (ref 3.5–5.1)
Sodium: 138 mmol/L (ref 135–145)

## 2017-01-20 LAB — GLUCOSE, CAPILLARY
GLUCOSE-CAPILLARY: 124 mg/dL — AB (ref 65–99)
GLUCOSE-CAPILLARY: 154 mg/dL — AB (ref 65–99)
GLUCOSE-CAPILLARY: 162 mg/dL — AB (ref 65–99)
GLUCOSE-CAPILLARY: 167 mg/dL — AB (ref 65–99)
Glucose-Capillary: 273 mg/dL — ABNORMAL HIGH (ref 65–99)

## 2017-01-20 LAB — CULTURE, BLOOD (ROUTINE X 2): Culture: NO GROWTH

## 2017-01-20 LAB — LACTIC ACID, PLASMA
Lactic Acid, Venous: 5 mmol/L (ref 0.5–1.9)
Lactic Acid, Venous: 1.3 mmol/L (ref 0.5–1.9)

## 2017-01-20 LAB — PROCALCITONIN: Procalcitonin: 0.13 ng/mL

## 2017-01-20 LAB — BRAIN NATRIURETIC PEPTIDE: B Natriuretic Peptide: 754 pg/mL — ABNORMAL HIGH (ref 0.0–100.0)

## 2017-01-20 MED ORDER — VENLAFAXINE HCL ER 37.5 MG PO CP24
37.5000 mg | ORAL_CAPSULE | Freq: Every day | ORAL | Status: DC
Start: 2017-01-21 — End: 2017-01-20

## 2017-01-20 MED ORDER — PRO-STAT SUGAR FREE PO LIQD
30.0000 mL | Freq: Two times a day (BID) | ORAL | Status: DC
  Administered 2017-01-20 – 2017-01-21 (×4): 30 mL
  Administered 2017-01-22: 11:00:00
  Administered 2017-01-22 – 2017-01-23 (×3): 30 mL

## 2017-01-20 MED ORDER — AMIODARONE HCL 200 MG PO TABS: 100.0000 mg | ORAL_TABLET | Freq: Every day | ORAL | Status: DC

## 2017-01-20 MED ORDER — LEVOTHYROXINE SODIUM 50 MCG PO TABS
150.0000 ug | ORAL_TABLET | Freq: Every day | ORAL | Status: DC
  Administered 2017-01-21 – 2017-01-23 (×3): 150 ug
  Filled 2017-01-20 (×3): qty 1

## 2017-01-20 MED ORDER — FENTANYL BOLUS VIA INFUSION
25.0000 ug | INTRAVENOUS | Status: DC | PRN
  Filled 2017-01-20: qty 25

## 2017-01-20 MED ORDER — SENNOSIDES-DOCUSATE SODIUM 8.6-50 MG PO TABS
1.0000 | ORAL_TABLET | Freq: Every evening | ORAL | Status: DC | PRN
  Administered 2017-01-21 – 2017-01-22 (×2): 1
  Filled 2017-01-20 (×2): qty 1

## 2017-01-20 MED ORDER — DONEPEZIL HCL 10 MG PO TABS: 10.0000 mg | ORAL_TABLET | Freq: Every day | ORAL | Status: DC

## 2017-01-20 MED ORDER — ROCURONIUM BROMIDE 50 MG/5ML IV SOLN
INTRAVENOUS | Status: AC | PRN
  Administered 2017-01-20: 80 mg via INTRAVENOUS

## 2017-01-20 MED ORDER — PROPOFOL 10 MG/ML IV BOLUS
INTRAVENOUS | Status: AC | PRN
  Administered 2017-01-20: 20 ug via INTRAVENOUS
  Administered 2017-01-20: 1814 ug via INTRAVENOUS

## 2017-01-20 MED ORDER — SODIUM CHLORIDE 0.9 % IV BOLUS (SEPSIS)
1000.0000 mL | Freq: Once | INTRAVENOUS | Status: AC
  Administered 2017-01-20: 1000 mL via INTRAVENOUS

## 2017-01-20 MED ORDER — GABAPENTIN 400 MG PO CAPS: 400.0000 mg | ORAL_CAPSULE | Freq: Two times a day (BID) | ORAL | Status: DC

## 2017-01-20 MED ORDER — ONDANSETRON HCL 4 MG PO TABS: 4.0000 mg | ORAL_TABLET | Freq: Four times a day (QID) | ORAL | Status: DC | PRN

## 2017-01-20 MED ORDER — FENTANYL CITRATE (PF) 100 MCG/2ML IJ SOLN
100.0000 ug | Freq: Once | INTRAMUSCULAR | Status: AC
  Administered 2017-01-20: 100 ug via INTRAVENOUS

## 2017-01-20 MED ORDER — SODIUM CHLORIDE 0.9 % IV SOLN
100.0000 ug/h | INTRAVENOUS | Status: DC
  Filled 2017-01-20: qty 50

## 2017-01-20 MED ORDER — DONEPEZIL HCL 5 MG PO TABS
10.0000 mg | ORAL_TABLET | Freq: Every day | ORAL | Status: DC
Start: 2017-01-20 — End: 2017-01-24
  Administered 2017-01-20 – 2017-01-23 (×4): 10 mg
  Filled 2017-01-20: qty 2
  Filled 2017-01-20: qty 1
  Filled 2017-01-20 (×4): qty 2

## 2017-01-20 MED ORDER — KETAMINE HCL 10 MG/ML IJ SOLN
INTRAMUSCULAR | Status: AC | PRN
  Administered 2017-01-20: 160 mg via INTRAVENOUS

## 2017-01-20 MED ORDER — FREE WATER
75.0000 mL | Freq: Four times a day (QID) | Status: DC
  Administered 2017-01-20 – 2017-01-24 (×14): 75 mL

## 2017-01-20 MED ORDER — VANCOMYCIN HCL IN DEXTROSE 1-5 GM/200ML-% IV SOLN
1000.0000 mg | Freq: Once | INTRAVENOUS | Status: AC
  Administered 2017-01-20: 1000 mg via INTRAVENOUS
  Filled 2017-01-20: qty 200

## 2017-01-20 MED ORDER — ASPIRIN 81 MG PO CHEW
81.0000 mg | CHEWABLE_TABLET | Freq: Every day | ORAL | Status: DC
  Administered 2017-01-20 – 2017-01-23 (×4): 81 mg
  Filled 2017-01-20 (×5): qty 1

## 2017-01-20 MED ORDER — SODIUM CHLORIDE 0.9 % IV SOLN: 100.0000 ug/h | INTRAVENOUS | Status: DC

## 2017-01-20 MED ORDER — IPRATROPIUM-ALBUTEROL 0.5-2.5 (3) MG/3ML IN SOLN
3.0000 mL | Freq: Four times a day (QID) | RESPIRATORY_TRACT | Status: DC
  Administered 2017-01-20 – 2017-01-24 (×17): 3 mL via RESPIRATORY_TRACT
  Filled 2017-01-20 (×18): qty 3

## 2017-01-20 MED ORDER — AMIODARONE HCL 200 MG PO TABS
100.0000 mg | ORAL_TABLET | Freq: Every day | ORAL | Status: DC
  Administered 2017-01-20: 13:00:00
  Filled 2017-01-20: qty 1

## 2017-01-20 MED ORDER — VALPROATE SODIUM 250 MG/5ML PO SOLN
250.0000 mg | Freq: Three times a day (TID) | ORAL | Status: DC
  Administered 2017-01-20 – 2017-01-24 (×12): 250 mg
  Filled 2017-01-20 (×15): qty 5

## 2017-01-20 MED ORDER — ACETAMINOPHEN 325 MG PO TABS
650.0000 mg | ORAL_TABLET | Freq: Four times a day (QID) | ORAL | Status: DC | PRN
  Administered 2017-01-21 – 2017-01-23 (×5): 650 mg
  Filled 2017-01-20 (×5): qty 2

## 2017-01-20 MED ORDER — FENTANYL CITRATE (PF) 100 MCG/2ML IJ SOLN: 50.0000 ug | Freq: Once | INTRAMUSCULAR | Status: DC

## 2017-01-20 MED ORDER — QUETIAPINE FUMARATE 25 MG PO TABS: 25.0000 mg | ORAL_TABLET | Freq: Every day | ORAL | Status: DC

## 2017-01-20 MED ORDER — FENTANYL CITRATE (PF) 100 MCG/2ML IJ SOLN
INTRAMUSCULAR | Status: AC
  Administered 2017-01-20: 100 ug via INTRAVENOUS
  Filled 2017-01-20: qty 2

## 2017-01-20 MED ORDER — VITAL HIGH PROTEIN PO LIQD
1000.0000 mL | ORAL | Status: DC
  Administered 2017-01-20: 1000 mL

## 2017-01-20 MED ORDER — VITAMIN D-3 25 MCG (1000 UT) PO CAPS: 1000.0000 [IU] | ORAL_CAPSULE | Freq: Every day | ORAL | Status: DC

## 2017-01-20 MED ORDER — ONDANSETRON HCL 4 MG/2ML IJ SOLN: 4.0000 mg | Freq: Four times a day (QID) | INTRAMUSCULAR | Status: DC | PRN

## 2017-01-20 MED ORDER — NOREPINEPHRINE BITARTRATE 1 MG/ML IV SOLN
0.0000 ug/min | INTRAVENOUS | Status: DC
  Administered 2017-01-20: 4 ug/min via INTRAVENOUS
  Administered 2017-01-22: 5 ug/min via INTRAVENOUS
  Filled 2017-01-20 (×4): qty 16

## 2017-01-20 MED ORDER — GABAPENTIN 250 MG/5ML PO SOLN
400.0000 mg | Freq: Two times a day (BID) | ORAL | Status: DC
  Administered 2017-01-20: 400 mg
  Filled 2017-01-20 (×2): qty 8

## 2017-01-20 MED ORDER — FENTANYL 2500MCG IN NS 250ML (10MCG/ML) PREMIX INFUSION
25.0000 ug/h | INTRAVENOUS | Status: DC
  Administered 2017-01-20: 220 ug/h via INTRAVENOUS
  Administered 2017-01-20 – 2017-01-21 (×2): 200 ug/h via INTRAVENOUS
  Administered 2017-01-21: 220 ug/h via INTRAVENOUS
  Administered 2017-01-22: 200 ug/h via INTRAVENOUS
  Administered 2017-01-22 – 2017-01-23 (×2): 25 ug/h via INTRAVENOUS
  Filled 2017-01-20 (×6): qty 250

## 2017-01-20 MED ORDER — INSULIN ASPART 100 UNIT/ML ~~LOC~~ SOLN
0.0000 [IU] | SUBCUTANEOUS | Status: DC
  Administered 2017-01-20: 2 [IU] via SUBCUTANEOUS
  Administered 2017-01-20 – 2017-01-22 (×11): 3 [IU] via SUBCUTANEOUS
  Administered 2017-01-22: 2 [IU] via SUBCUTANEOUS
  Administered 2017-01-22 (×3): 3 [IU] via SUBCUTANEOUS
  Administered 2017-01-23: 5 [IU] via SUBCUTANEOUS
  Administered 2017-01-23: 3 [IU] via SUBCUTANEOUS
  Administered 2017-01-23 (×2): 5 [IU] via SUBCUTANEOUS
  Administered 2017-01-23 – 2017-01-24 (×2): 8 [IU] via SUBCUTANEOUS
  Administered 2017-01-24: 5 [IU] via SUBCUTANEOUS
  Administered 2017-01-24: 8 [IU] via SUBCUTANEOUS
  Filled 2017-01-20 (×26): qty 1

## 2017-01-20 MED ORDER — KETAMINE HCL 10 MG/ML IJ SOLN
INTRAMUSCULAR | Status: AC
  Filled 2017-01-20: qty 1

## 2017-01-20 MED ORDER — BUDESONIDE 0.25 MG/2ML IN SUSP
0.2500 mg | Freq: Two times a day (BID) | RESPIRATORY_TRACT | Status: DC
  Administered 2017-01-20 – 2017-01-21 (×3): 0.25 mg via RESPIRATORY_TRACT
  Filled 2017-01-20 (×3): qty 2

## 2017-01-20 MED ORDER — DEXTROSE 5 % IV SOLN
1.0000 g | Freq: Two times a day (BID) | INTRAVENOUS | Status: DC
  Administered 2017-01-20 – 2017-01-23 (×6): 1 g via INTRAVENOUS
  Filled 2017-01-20 (×7): qty 1

## 2017-01-20 MED ORDER — FERROUS SULFATE 300 (60 FE) MG/5ML PO SYRP
300.0000 mg | ORAL_SOLUTION | Freq: Two times a day (BID) | ORAL | Status: DC
  Filled 2017-01-20: qty 5

## 2017-01-20 MED ORDER — FERROUS SULFATE 325 (65 FE) MG PO TABS: 325.0000 mg | ORAL_TABLET | Freq: Two times a day (BID) | ORAL | Status: DC

## 2017-01-20 MED ORDER — DEXTROSE 5 % IV SOLN
2.0000 g | Freq: Once | INTRAVENOUS | Status: AC
  Administered 2017-01-20: 2 g via INTRAVENOUS
  Filled 2017-01-20: qty 2

## 2017-01-20 MED ORDER — VANCOMYCIN HCL 10 G IV SOLR
1250.0000 mg | INTRAVENOUS | Status: DC
  Administered 2017-01-20 – 2017-01-23 (×4): 1250 mg via INTRAVENOUS
  Filled 2017-01-20 (×4): qty 1250

## 2017-01-20 MED ORDER — SODIUM CHLORIDE 0.9 % IV BOLUS (SEPSIS): 1000.0000 mL | Freq: Once | INTRAVENOUS | Status: DC

## 2017-01-20 MED ORDER — ENOXAPARIN SODIUM 40 MG/0.4ML ~~LOC~~ SOLN
40.0000 mg | SUBCUTANEOUS | Status: DC
  Administered 2017-01-20 – 2017-01-23 (×4): 40 mg via SUBCUTANEOUS
  Filled 2017-01-20 (×4): qty 0.4

## 2017-01-20 MED ORDER — NOREPINEPHRINE BITARTRATE 1 MG/ML IV SOLN
0.0000 ug/min | Freq: Once | INTRAVENOUS | Status: DC
  Filled 2017-01-20: qty 16

## 2017-01-20 MED ORDER — SODIUM CHLORIDE 0.9% FLUSH
10.0000 mL | INTRAVENOUS | Status: DC | PRN
  Administered 2017-01-22: 10 mL
  Filled 2017-01-20: qty 40

## 2017-01-20 MED ORDER — VENLAFAXINE HCL 37.5 MG PO TABS
37.5000 mg | ORAL_TABLET | Freq: Every day | ORAL | Status: DC
  Administered 2017-01-20 – 2017-01-23 (×4): 37.5 mg
  Filled 2017-01-20 (×4): qty 1

## 2017-01-20 MED ORDER — FENTANYL 2500MCG IN NS 250ML (10MCG/ML) PREMIX INFUSION
100.0000 ug/h | INTRAVENOUS | Status: DC
  Administered 2017-01-20: 100 ug/h via INTRAVENOUS
  Administered 2017-01-20: 150 ug/h via INTRAVENOUS
  Filled 2017-01-20: qty 250

## 2017-01-20 MED ORDER — PROPOFOL 1000 MG/100ML IV EMUL
INTRAVENOUS | Status: AC
  Administered 2017-01-20: 20 ug via INTRAVENOUS
  Filled 2017-01-20: qty 100

## 2017-01-20 MED ORDER — VITAL HIGH PROTEIN PO LIQD
1000.0000 mL | ORAL | Status: DC
  Administered 2017-01-20 (×2): 1000 mL

## 2017-01-20 MED ORDER — FERROUS SULFATE 220 (44 FE) MG/5ML PO ELIX
220.0000 mg | ORAL_SOLUTION | Freq: Two times a day (BID) | ORAL | Status: DC
  Administered 2017-01-21 – 2017-01-23 (×6): 220 mg via ORAL
  Filled 2017-01-20 (×9): qty 5

## 2017-01-20 MED ORDER — DIVALPROEX SODIUM 250 MG PO DR TAB: 250.0000 mg | DELAYED_RELEASE_TABLET | Freq: Three times a day (TID) | ORAL | Status: DC

## 2017-01-20 MED ORDER — PANTOPRAZOLE SODIUM 40 MG PO PACK
40.0000 mg | PACK | Freq: Every day | ORAL | Status: DC
  Administered 2017-01-20 – 2017-01-23 (×4): 40 mg
  Filled 2017-01-20 (×4): qty 20

## 2017-01-20 MED ORDER — BUDESONIDE 0.25 MG/2ML IN SUSP: 0.2500 mg | Freq: Four times a day (QID) | RESPIRATORY_TRACT | Status: DC

## 2017-01-20 MED ORDER — LEVOTHYROXINE SODIUM 50 MCG PO TABS: 150.0000 ug | ORAL_TABLET | Freq: Every day | ORAL | Status: DC

## 2017-01-20 MED ORDER — ACETAMINOPHEN 650 MG RE SUPP
650.0000 mg | Freq: Four times a day (QID) | RECTAL | Status: DC | PRN
  Administered 2017-01-21 (×2): 650 mg via RECTAL
  Filled 2017-01-20 (×2): qty 1

## 2017-01-20 MED ORDER — NOREPINEPHRINE BITARTRATE 1 MG/ML IV SOLN
0.0000 ug/min | Freq: Once | INTRAVENOUS | Status: AC
  Administered 2017-01-20: 10 ug/min via INTRAVENOUS

## 2017-01-20 NOTE — Consult Note (Signed)
PULMONARY / CRITICAL CARE MEDICINE   Name: Theresa Vasquez MRN: [id number] DOB: [birth year]    ADMISSION DATE:  01/28/2017 CONSULTATION DATE: 01/20/2017  REFERRING MD:  Dr. Verdell Carmine   CHIEF COMPLAINT: Acute Respiratory Failure  HISTORY OF PRESENT ILLNESS:   This is a 83 yo female with a PMH of Vascular Disorder of Intestine, Vascular Dementia, Type II DM, Recurrent Pneumonia, PVD, PUD, Paroxysmal Atrial Fibrillation, Psychoactive Substance Abuse, Osteoarthritis, Mitral Valve Insufficiency, LBBB, Hypothyroidism, HTN, Hyperlipidemia, Gastrointestinal Hemorrhage, CKD, Diverticulosis, Depression, COPD, CHF, and Adenomatous Polyps. She presented to [facility] ER 07/4 via EMS unresponsive with respiratory distress.  Per ER notes EMS reported O2 sats upon arrival were in the 50's requiring bag mask ventilation with O2 sats improving to the 80's.  The pt was discharged from [facility] 07/3 with a PICC line in place for IV antibiotics due to a diagnosis of staph pneumonia.  At baseline she is bed bound and requires total care. Due to presenting symptoms pt required mechanical intubation and was admitted to the ICU by hospitalist team PCCM consulted for management.    PAST MEDICAL HISTORY :  She  has a past medical history of Adenomatous polyps; Age-related osteoporosis without current pathological fracture; Anemia; Anxiety; Atherosclerosis of aorta ([place]); Atrial fibrillation with RVR ([place]); Barrett's esophagus without dysplasia; CHF (congestive heart failure) ([place]); Colitis; COPD (chronic obstructive pulmonary disease) ([place]); Dementia; Depression; Diabetes mellitus type II, uncontrolled ([place]); Diverticulitis; Diverticulosis of intestine without perforation or abscess without bleeding; Edema; Essential hemorrhagic thrombocythemia ([place]); Gastrointestinal hemorrhage; High anion gap metabolic acidosis; Hyperlipidemia; Hypertension; Hypertensive chronic kidney disease with stage 1 through stage 4 chronic kidney disease, or  unspecified chronic kidney disease; Hypothyroidism; Hypoxemia; Iron deficiency anemia; Lactic acidosis; Left bundle branch block; Nonrheumatic mitral valve insufficiency; Osteoarthritis; Other psychoactive substance dependence, in remission ([place]); Other specified symptoms and signs involving the circulatory and respiratory systems; Paroxysmal atrial fibrillation ([place]); Personal history of peptic ulcer disease; Pulmonary edema; PVD (peripheral vascular disease) ([place]) (05/14/2015); Recurrent pneumonia; Respiratory failure with hypoxia and hypercapnia ([place]); Type 2 diabetes mellitus with diabetic chronic kidney disease ([place]); Type 2 diabetes mellitus with diabetic polyneuropathy ([place]); Vascular dementia without behavioral disturbance; and Vascular disorder of intestine ([place]).  PAST SURGICAL HISTORY: She  has a past surgical history that includes Abdominal hysterectomy; Colonoscopy; Sigmoidoscopy; Shoulder arthroscopy; Appendectomy; Esophagogastroduodenoscopy; Esophagogastroduodenoscopy (egd) with propofol (N/A, 07/04/2015); Intramedullary (im) nail intertrochanteric (Left, 06/18/2016); and Hip surgery (Left, 06/2016).  No Known Allergies  No current facility-administered medications on file prior to encounter.    Current Outpatient Prescriptions on File Prior to Encounter  Medication Sig  . acetaminophen (TYLENOL) 500 MG tablet Take 1 tablet (500 mg total) by mouth every 4 (four) hours as needed for mild pain, fever or headache.  . ADVAIR DISKUS 250-50 MCG/DOSE AEPB INHALE ONE PUFF TWICE DAILY  . albuterol (PROVENTIL) (2.5 MG/3ML) 0.083% nebulizer solution Take 3 mLs (2.5 mg total) by nebulization every 4 (four) hours as needed for wheezing or shortness of breath.  Marland Kitchen amiodarone (PACERONE) 100 MG tablet Take 100 mg by mouth daily.   Marland Kitchen amLODipine (NORVASC) 5 MG tablet TAKE ONE TABLET TWICE DAILY  . aspirin EC 81 MG tablet Take 81 mg by mouth daily.   . Cholecalciferol (VITAMIN D-3) 1000 units CAPS Take 1  capsule (1,000 Units total) by mouth daily.  . divalproex (DEPAKOTE) 250 MG DR tablet TAKE ONE TABLET BY MOUTH 3 TIMES DAILY  . donepezil (ARICEPT) 10 MG tablet Take 1 tablet (10 mg total) by mouth  at bedtime.  . ferrous sulfate 325 (65 FE) MG tablet TAKE ONE TABLET BY MOUTH TWICE DAILY WITH A MEAL  . furosemide (LASIX) 40 MG tablet TAKE ONE TABLET TWICE DAILY  . gabapentin (NEURONTIN) 400 MG capsule Take 1 capsule (400 mg total) by mouth 2 (two) times daily.  Marland Kitchen levothyroxine (SYNTHROID, LEVOTHROID) 150 MCG tablet TAKE ONE (1) TABLET BY MOUTH EVERY DAY BEFORE BREAKFAST  . lisinopril (PRINIVIL,ZESTRIL) 20 MG tablet TAKE ONE (1) TABLET EACH DAY  . Melatonin 3 MG TABS Take 1 tablet (3 mg total) by mouth at bedtime.  . metoprolol tartrate (LOPRESSOR) 25 MG tablet Take 1 tablet by mouth every 12 (twelve) hours.  . pantoprazole (PROTONIX) 40 MG tablet Take 40 mg by mouth daily.  . QUEtiapine (SEROQUEL) 25 MG tablet Take 1 tablet (25 mg total) by mouth at bedtime.  . senna-docusate (SENOKOT-S) 8.6-50 MG tablet Take 1 tablet by mouth at bedtime as needed for mild constipation.  Marland Kitchen SPIRIVA HANDIHALER 18 MCG inhalation capsule INHALE CONTENTS OF ONE CAPSULE ONCE A DAY AS DIRECTED  . venlafaxine XR (EFFEXOR-XR) 37.5 MG 24 hr capsule TAKE 1 CAPSULE BY MOUTH EVERY DAY WITH BREAKFAST  . ceFAZolin (ANCEF) 2-4 GM/100ML-% IVPB Inject 100 mLs (2 g total) into the vein every 8 (eight) hours.  Marland Kitchen ceFAZolin (ANCEF) IVPB Inject 2 g into the vein every 8 (eight) hours. Indication:  bacteremia Last Day of Therapy:  02/16/2017 Labs - Once weekly:  CBC/D and BMP, Labs - Every other week:  ESR and CRP  . MICROLET LANCETS MISC TEST BLOOD GLUCOSE LEVEL ONCE DAILY    FAMILY HISTORY:  Her indicated that her mother is deceased. She indicated that her father is deceased. She indicated that her brother is deceased. She indicated that only one of her two daughters is alive. She indicated that her son is alive.    SOCIAL  HISTORY: She  reports that she has quit smoking. Her smoking use included Cigarettes. She has a 60.00 pack-year smoking history. She has never used smokeless tobacco. She reports that she does not drink alcohol or use drugs.  REVIEW OF SYSTEMS:   Unable to assess pt intubated   SUBJECTIVE:  Unable to assess pt intubated   VITAL SIGNS: BP (!) 90/52   Pulse (!) 113   Temp 98.8 F (37.1 C)   Resp 14   SpO2 91%   HEMODYNAMICS:    VENTILATOR SETTINGS: Vent Mode: PRVC FiO2 (%):  [60 %-100 %] 60 % Set Rate:  [14 bmp-18 bmp] 14 bmp Vt Set:  [500 mL] 500 mL PEEP:  [5 cmH20-10 cmH20] 5 cmH20  INTAKE / OUTPUT: No intake/output data recorded.  PHYSICAL EXAMINATION: General: chronically ill appearing Caucasian female, NAD mechanically intubated  Neuro: sedated, follows commands, PERRL HEENT: supple, no JVD Cardiovascular: irregular, irregular, no M/R/G Lungs: coarse and diminished throughout, even, non labored  Abdomen: hypoactive BS x4, obese, soft, non tender, non distended  Musculoskeletal: 1+ bilateral edema, normal bulk  Skin: scattered ecchymosis, scattered skin tears dressings intact   LABS:  BMET  Recent Labs Lab 01/17/17 2157 01/18/17 0402 01/19/2017 0700  NA 136 138 138  K 3.8 3.6 4.1  CL 98* 100* 97*  CO2 _0 BUN _1 CREATININE 0.63 0.67 1.00  GLUCOSE 173* 176* 377*    Electrolytes  Recent Labs Lab 01/17/17 2157 01/18/17 0402 02/12/2017 0700  CALCIUM 7.8* 7.7* 8.1*  MG 1.9  --   --   PHOS  3.3  --   --     CBC  Recent Labs Lab 01/17/17 0531 01/18/17 0402 01/19/17 1334 01/23/2017 0700  WBC 8.7 7.2  --  19.3*  HGB 8.0* 7.3* 8.0* 8.6*  HCT 24.9* 22.6*  --  28.8*  PLT 331 324  --  652*    Coag's No results for input(s): APTT, INR in the last 168 hours.  Sepsis Markers  Recent Labs Lab 01/15/17 1650 02/13/2017 0700 01/22/2017 0732 01/23/2017 0950  LATICACIDVEN 2.6*  --  5.0* 1.3  PROCALCITON  --  0.13  --   --      ABG  Recent Labs Lab 01/16/17 0830 01/17/17 0915 02/05/2017 0812  PHART 7.43 7.42 7.31*  PCO2ART 46 48 58*  PO2ART 70* 91 82*    Liver Enzymes  Recent Labs Lab 01/15/17 1339  AST 45*  ALT 17  ALKPHOS 48  BILITOT 0.7  ALBUMIN 3.1*    Cardiac Enzymes  Recent Labs Lab 01/30/2017 0700  TROPONINI <0.03    Glucose  Recent Labs Lab 01/18/17 2106 01/19/17 0752 01/19/17 1028 01/19/17 1142 01/19/17 1650 02/13/2017 0921  GLUCAP 170* 148* 220* 143* 130* 273*    Imaging Dg Chest Portable 1 View  Result Date: 02/14/2017 CLINICAL DATA:  Post intubation, unresponsive EXAM: PORTABLE CHEST 1 VIEW COMPARISON:  01/18/2017 FINDINGS: Right PICC line tip is at the cavoatrial junction. Endotracheal tube is 3 cm above the carina. NG tube enters the stomach. Heart is normal size. Bilateral airspace opacities are again noted, most pronounced in the upper lobes. This has worsened on the left since prior study. This could represent edema or infection. IMPRESSION: Bilateral airspace opacities, worsening in the left upper lobe since prior study. This could reflect worsening edema or infection. Electronically Signed   By: Rolm Baptise M.D.   On: 01/21/2017 07:24   Dg Abd Portable 1 View  Result Date: 01/19/2017 CLINICAL DATA:  OG tube placement EXAM: PORTABLE ABDOMEN - 1 VIEW COMPARISON:  10/11/2014 FINDINGS: NG tube tip is in the proximal stomach. Nonobstructive bowel gas pattern. Moderate to large stool burden throughout the colon. No free air. IMPRESSION: NG tube tip in the proximal stomach. Moderate to large stool burden. Electronically Signed   By: Rolm Baptise M.D.   On: 01/23/2017 07:25   STUDIES:  None    CULTURES: Urine 07/4>> Sputum 07/4>>  ANTIBIOTICS: Cefepime 07/4>> Vancomycin 07/4>>  SIGNIFICANT EVENTS: 07/4-Pt admitted to the ICU mechanically intubated   LINES/TUBES: ETT 07/4>>  ASSESSMENT / PLAN:  PULMONARY A: Acute on chronic hypoxic hypercapnic respiratory  failure secondary to pulmonary edema and AECOPD HCAP   Mechanical Intubation  Hx:  P:   Full vent support for now  Scheduled bronchodilator therapy and nebulized steroids VAP bundle Repeat cxr in am Prn ABG   CARDIOVASCULAR A:  Chronic Atrial Fibrillation  Hypotension Hx: PVD, LBBB, HTN, Atherosclerosis of Aorta, and CHF P:  Prn levophed gtt to maintain map goal >65 Continue outpatient amiodarone and aspirin  Hold outpatient furosemide, metoprolol, and lisinopril  Continue telemetry monitoring  RENAL A:   No acute issues  Hx: CKD  P:   Trend BMP Replace electrolytes as indicated Monitor UOP  GASTROINTESTINAL A:   No acute issues  P:   Will start TF's Protonix for PUD prophylaxis   HEMATOLOGIC A:   Anemia without acute blood loss  P:  Lovenox for VTE prophylaxis Trend CBC Monitor for s/sx of bleeding Transfuse for hgb <7  INFECTIOUS A:  HCAP Leukocytosis  P:   Trend WBC and monitor fever curve Trend PCT's  Follow cultures  Continue abx as listed above   ENDOCRINE A:   Type II DM  Hx: Hypothyroidism P:   CBG's q4hrs SSI   NEUROLOGIC A:   Acute encephalopathy likely secondary to hypercapnia  Hx: Depression P:   RASS goal: 0 to -1 Fentanyl gtt to maintain RASS goal and for pain management WUA daily    FAMILY  - Updates: Dr. Alva Garnet updated pts family at bedside and code status changed to DO NOT RESUSCITATE per family request 007/22/2018.  - Inter-disciplinary family meet or Palliative Care meeting due by: 07/11/208  Marda Stalker, [place] Pager [phone] (please enter 7 digits) [place] Pager [phone] (please enter 7 digits)  PCCM ATTENDING ATTESTATION:  I have evaluated patient with the APP [place], reviewed database in its entirety and discussed care plan in detail. In addition, this patient was discussed on multidisciplinary rounds.   Important exam findings: Chronically ill-appearing Intubated,  sedated Diminished breath sounds with diffuse distant wheezes IR IR, no murmur noted Obese, soft, diminished bowel sounds Extremities warm, no edema No focal neurologic deficits  Chest x-ray: Consistent with pulmonary edema  EKG: Atrial fibrillation, LBBB  All laboratory data has been reviewed by me  Major problems addressed by PCCM team: Recent pneumonia with bacteremia due to MSSA Acute on chronic respiratory failure with hypoxemia Bilateral pulmonary infiltrates consistent with pulmonary edema Acute bronchospasm Chronic paroxysmal atrial fibrillation Hypotension - adequately supported on norepinephrine Lactic acidosis, mild AKI, mild Chronic debilitation due to advanced dementia and multiple medical problems   PLAN/REC: Cont full vent support - settings reviewed and/or adjusted Cont vent bundle Daily SBT if/when meets criteria MAP goal >65 mmHg Cycle cardiac markers Monitor BMET intermittently Monitor I/Os Correct electrolytes as indicated Empiric antibiotics  I spoke at length with the patient's son addressing goals of care. He acknowledge is her declining state of health. He is aware of her current critical illness. We agreed to DNR in event of cardiac arrest with no major escalation (for example no dialysis under any circumstances). If she does not improve in the next couple or few days, we will discuss terminal extubation. Prior to extubation, she will need to have her reintubation status clearly defined.  CCM time: 56 mins Merton Border, MD PCCM service Mobile [phone] Pager [phone] 01/21/2017 12:45 PM

## 2017-01-20 NOTE — Progress Notes (Signed)
Pt was intubated by Dr. Joni Fears with 7.5 ETT, with the glide a scope, tube secured 22cm at the lip on the right side with a commercial tube holder. Cords were visualized, ETCO2 color change, BBS equal, Chest xray pending. Pt was placed on the vent AC 500, Rate 18, FIO2 100%, Peep 10.

## 2017-01-20 NOTE — H&P (Signed)
[facility] [place] NAME: Theresa Vasquez    MR#:  [id number]  DATE OF BIRTH:  August 17, 1930  DATE OF ADMISSION:  02/11/2017  PRIMARY CARE PHYSICIAN: Valerie Roys, DO   REQUESTING/REFERRING PHYSICIAN: Dr. Brenton Grills  CHIEF COMPLAINT:   Chief Complaint  Patient presents with  . Respiratory Distress    HISTORY OF PRESENT ILLNESS:  Theresa Vasquez  is a 83 y.o. female with a known history of Atrial fibrillation, anxiety, dementia, depression, diabetes, hypertension, history of diastolic CHF, COPD, hypothyroidism, peripheral vascular disease presented to the hospital due to shortness of breath and respiratory distress. Patient was just discharged from the hospital yesterday with home health services and treated for MSSA pneumonia and placed on IV Ancef at home. She developed significant respiratory distress and hypoxia at home this morning with her O2 sats were in the 50s to 60s. EMS was called and patient was brought to the hospital for further evaluation. Patient was noted to be in acute respiratory failure with hypoxia and urgently intubated in the ER. She remains intubated and sedated presently. She was noted to have a leukocytosis with elevated lactic acid and also noted to be hypotensive and hypothermic. She meets criteria for sepsis and also acute respiratory failure with hypoxia. Hospitalist services were contacted further treatment and evaluation.  PAST MEDICAL HISTORY:   Past Medical History:  Diagnosis Date  . Adenomatous polyps   . Age-related osteoporosis without current pathological fracture   . Anemia   . Anxiety   . Atherosclerosis of aorta ([place])   . Atrial fibrillation with RVR ([place])   . Barrett's esophagus without dysplasia   . CHF (congestive heart failure) ([place])   . Colitis   . COPD (chronic obstructive pulmonary disease) ([place])   . Dementia   . Depression   . Diabetes mellitus type II, uncontrolled ([place])   .  Diverticulitis   . Diverticulosis of intestine without perforation or abscess without bleeding   . Edema   . Essential hemorrhagic thrombocythemia ([place])   . Gastrointestinal hemorrhage   . High anion gap metabolic acidosis   . Hyperlipidemia   . Hypertension   . Hypertensive chronic kidney disease with stage 1 through stage 4 chronic kidney disease, or unspecified chronic kidney disease   . Hypothyroidism   . Hypoxemia   . Iron deficiency anemia   . Lactic acidosis   . Left bundle branch block   . Nonrheumatic mitral valve insufficiency   . Osteoarthritis   . Other psychoactive substance dependence, in remission ([place])   . Other specified symptoms and signs involving the circulatory and respiratory systems   . Paroxysmal atrial fibrillation ([facility])    s/p cardioversion  . Personal history of peptic ulcer disease   . Pulmonary edema   . PVD (peripheral vascular disease) ([place]) 05/14/2015  . Recurrent pneumonia    history of  . Respiratory failure with hypoxia and hypercapnia ([place])   . Type 2 diabetes mellitus with diabetic chronic kidney disease ([place])   . Type 2 diabetes mellitus with diabetic polyneuropathy ([place])   . Vascular dementia without behavioral disturbance   . Vascular disorder of intestine ([place])     PAST SURGICAL HISTORY:   Past Surgical History:  Procedure Laterality Date  . ABDOMINAL HYSTERECTOMY     partial  . APPENDECTOMY    . COLONOSCOPY    . ESOPHAGOGASTRODUODENOSCOPY    . ESOPHAGOGASTRODUODENOSCOPY (EGD) WITH PROPOFOL N/A 07/04/2015   Procedure: ESOPHAGOGASTRODUODENOSCOPY (EGD)  WITH PROPOFOL;  Surgeon: Manya Silvas, MD;  Location[facility] END[facility]COPY;  Service: Endoscopy;  Laterality: N/A;  . HIP SURGERY Left 06/2016   Rod placed   . INTRAMEDULLARY (IM) NAIL INTERTROCHANTERIC Left 06/18/2016   Procedure: INTRAMEDULLARY (IM) NAIL INTERTROCHANTRIC;  Surgeon: Dereck Leep, MD;  Location: [facility] ORS;  Service: Orthopedics;  Laterality: Left;  . SHOULDER  ARTHROSCOPY    . SIGMOIDOSCOPY      SOCIAL HISTORY:   Social History  Substance Use Topics  . Smoking status: Former Smoker    Packs/day: 1.00    Years: 60.00    Types: Cigarettes  . Smokeless tobacco: Never Used  . Alcohol use No    FAMILY HISTORY:   Family History  Problem Relation Age of Onset  . Diabetes Mother   . Diabetes Father     DRUG ALLERGIES:  No Known Allergies  REVIEW OF SYSTEMS:   Review of Systems  Unable to perform ROS: Intubated    MEDICATIONS AT HOME:   Prior to Admission medications   Medication Sig Start Date End Date Taking? Authorizing Provider  acetaminophen (TYLENOL) 500 MG tablet Take 1 tablet (500 mg total) by mouth every 4 (four) hours as needed for mild pain, fever or headache. 07/23/16  Yes Gladstone Lighter, MD  ADVAIR DISKUS 250-50 MCG/DOSE AEPB INHALE ONE PUFF TWICE DAILY 12/08/16  Yes Johnson, Megan P, DO  albuterol (PROVENTIL) (2.5 MG/3ML) 0.083% nebulizer solution Take 3 mLs (2.5 mg total) by nebulization every 4 (four) hours as needed for wheezing or shortness of breath. 11/26/15  Yes Johnson, Megan P, DO  amiodarone (PACERONE) 100 MG tablet Take 100 mg by mouth daily.    Yes [provider]  amLODipine (NORVASC) 5 MG tablet TAKE ONE TABLET TWICE DAILY 10/06/16  Yes Johnson, Megan P, DO  aspirin EC 81 MG tablet Take 81 mg by mouth daily.    Yes [provider]  Cholecalciferol (VITAMIN D-3) 1000 units CAPS Take 1 capsule (1,000 Units total) by mouth daily. 11/26/15  Yes Johnson, Megan P, DO  divalproex (DEPAKOTE) 250 MG DR tablet TAKE ONE TABLET BY MOUTH 3 TIMES DAILY 10/06/16  Yes Johnson, Megan P, DO  donepezil (ARICEPT) 10 MG tablet Take 1 tablet (10 mg total) by mouth at bedtime. 11/26/15  Yes Johnson, Megan P, DO  ferrous sulfate 325 (65 FE) MG tablet TAKE ONE TABLET BY MOUTH TWICE DAILY WITH A MEAL 12/11/16  Yes Johnson, Megan P, DO  furosemide (LASIX) 40 MG tablet TAKE ONE TABLET TWICE DAILY 12/11/16  Yes Johnson, Megan  P, DO  gabapentin (NEURONTIN) 400 MG capsule Take 1 capsule (400 mg total) by mouth 2 (two) times daily. 12/01/16  Yes Johnson, Megan P, DO  levothyroxine (SYNTHROID, LEVOTHROID) 150 MCG tablet TAKE ONE (1) TABLET BY MOUTH EVERY DAY BEFORE BREAKFAST 12/02/16  Yes Johnson, Megan P, DO  lisinopril (PRINIVIL,ZESTRIL) 20 MG tablet TAKE ONE (1) TABLET EACH DAY 12/01/16  Yes Johnson, Megan P, DO  Melatonin 3 MG TABS Take 1 tablet (3 mg total) by mouth at bedtime. 05/28/15  Yes Johnson, Megan P, DO  metoprolol tartrate (LOPRESSOR) 25 MG tablet Take 1 tablet by mouth every 12 (twelve) hours. 01/11/17  Yes [provider]  pantoprazole (PROTONIX) 40 MG tablet Take 40 mg by mouth daily.   Yes [provider]  QUEtiapine (SEROQUEL) 25 MG tablet Take 1 tablet (25 mg total) by mouth at bedtime. 11/26/15  Yes Johnson, Megan P, DO  senna-docusate (SENOKOT-S) 8.6-50 MG  tablet Take 1 tablet by mouth at bedtime as needed for mild constipation. 06/23/16  Yes Demetrios Loll, MD  SPIRIVA HANDIHALER 18 MCG inhalation capsule INHALE CONTENTS OF ONE CAPSULE ONCE A DAY AS DIRECTED 12/08/16  Yes Johnson, Megan P, DO  venlafaxine XR (EFFEXOR-XR) 37.5 MG 24 hr capsule TAKE 1 CAPSULE BY MOUTH EVERY DAY WITH BREAKFAST 09/30/16  Yes Johnson, Megan P, DO  ceFAZolin (ANCEF) 2-4 GM/100ML-% IVPB Inject 100 mLs (2 g total) into the vein every 8 (eight) hours. 01/19/17   Henreitta Leber, MD  ceFAZolin (ANCEF) IVPB Inject 2 g into the vein every 8 (eight) hours. Indication:  bacteremia Last Day of Therapy:  02/16/2017 Labs - Once weekly:  CBC/D and BMP, Labs - Every other week:  ESR and CRP 01/19/17   Darely Becknell, Belia Heman, MD  MICROLET LANCETS MISC TEST BLOOD GLUCOSE LEVEL ONCE DAILY 04/10/16   Johnson, Megan P, DO      VITAL SIGNS:  Blood pressure (!) 87/56, pulse 85, resp. rate 19, SpO2 100 %.  PHYSICAL EXAMINATION:  Physical Exam  GENERAL:  83 y.o.-year-old patient lying in the bed Critically ill intubated and sedated.  EYES:  Pupils equal, round, reactive to light. No scleral icterus.  HEENT: Head atraumatic, normocephalic. Oropharynx and nasopharynx clear. ET and OG tube in place.  NECK:  Supple, no jugular venous distention. No thyroid enlargement, no tenderness.  LUNGS: Normal breath sounds bilaterally, no wheezing, rales, rhonchi. No use of accessory muscles of respiration.  CARDIOVASCULAR: S1, S2 RRR. No murmurs, rubs, gallops, clicks.  ABDOMEN: Soft, nontender, nondistended. Bowel sounds present. No organomegaly or mass.  EXTREMITIES: No pedal edema, cyanosis, or clubbing. Signs of chronic venous stasis bilaterally.  NEUROLOGIC: Intubated and sedated. PSYCHIATRIC: Intubated and sedated SKIN: No obvious rash, lesion, or ulcer.   LABORATORY PANEL:   CBC  Recent Labs Lab 01/18/2017 0700  WBC 19.3*  HGB 8.6*  HCT 28.8*  PLT 652*   ------------------------------------------------------------------------------------------------------------------  Chemistries   Recent Labs Lab 01/15/17 1339  01/17/17 2157  01/25/2017 0700  NA 137  < > 136  < > 138  K 4.8  < > 3.8  < > 4.1  CL 100*  < > 98*  < > 97*  CO2 25  < > 31  < > 29  GLUCOSE 176*  < > 173*  < > 377*  BUN 16  < > 13  < > 14  CREATININE 0.81  < > 0.63  < > 1.00  CALCIUM 8.2*  < > 7.8*  < > 8.1*  MG  --   --  1.9  --   --   AST 45*  --   --   --   --   ALT 17  --   --   --   --   ALKPHOS 48  --   --   --   --   BILITOT 0.7  --   --   --   --   < > = values in this interval not displayed. ------------------------------------------------------------------------------------------------------------------  Cardiac Enzymes  Recent Labs Lab 01/25/2017 0700  TROPONINI <0.03   ------------------------------------------------------------------------------------------------------------------  RADIOLOGY:  Dg Chest Portable 1 View  Result Date: 01/31/2017 CLINICAL DATA:  Post intubation, unresponsive EXAM: PORTABLE CHEST 1 VIEW COMPARISON:   01/18/2017 FINDINGS: Right PICC line tip is at the cavoatrial junction. Endotracheal tube is 3 cm above the carina. NG tube enters the stomach. Heart is normal size. Bilateral airspace opacities are again noted,  most pronounced in the upper lobes. This has worsened on the left since prior study. This could represent edema or infection. IMPRESSION: Bilateral airspace opacities, worsening in the left upper lobe since prior study. This could reflect worsening edema or infection. Electronically Signed   By: Rolm Baptise M.D.   On: 01/26/2017 07:24   Dg Abd Portable 1 View  Result Date: 02/15/2017 CLINICAL DATA:  OG tube placement EXAM: PORTABLE ABDOMEN - 1 VIEW COMPARISON:  10/11/2014 FINDINGS: NG tube tip is in the proximal stomach. Nonobstructive bowel gas pattern. Moderate to large stool burden throughout the colon. No free air. IMPRESSION: NG tube tip in the proximal stomach. Moderate to large stool burden. Electronically Signed   By: Rolm Baptise M.D.   On: 02/12/2017 07:25     IMPRESSION AND PLAN:   83 year old female with a known history of Atrial fibrillation, anxiety, dementia, depression, diabetes, hypertension, history of diastolic CHF, COPD, hypothyroidism, peripheral vascular disease presented to the hospital due to shortness of breath and respiratory distress.  1. Sepsis-patient meets criteria given her hypothermia, elevated lactic acid, leukocytosis. -Source is suspected to be pulmonary. She was just recently discharged from the hospital on IV Ancef for MSSA bacteremia from suspected MSSA pneumonia. Given her acute illness and her hypotension we'll broaden antibiotic coverage to IV vancomycin, for pain. -Continue IV fluids, Levophed to maintain an MAP greater than 55. Follow cultures  2. Acute respiratory failure with hypoxia-secondary to suspected pneumonia and also underlying pulmonary edema. -currently intubated sedated. Currently on 85% FiO2 with 10 of PEEP. Follow serial ABGs, further  care as per pulmonary. -Continue IV antibiotics for pneumonia as mentioned above. Presently hypotensive and therefore cannot give diuretics will await once patient's hemodynamics improved.  3. CHF-acute on chronic diastolic dysfunction. -Patient is presently hypotensive and therefore cannot give diuretics. We'll give diuretics once hemodynamics improved. We'll check a BNP. He recent echocardiogram in June which showed normal ejection fraction of 55-60%. Next  4. History of dementia-continue Aricept, Depakote.  5. Chronic atrial fibrillation-currently rates labile. And since patient is hypotensive hold metoprolol. Continue amiodarone. -Patient is not on long-term anticoagulation due to high fall risk.  6. Hypothyroidism-continue Synthroid.  7. COPD-no acute exacerbation.  8. GERD-continue Protonix.  9. Neuropathy-continue gabapentin.  10. Essential hypertension-patient is currently severely hypotensive and on vasopressors. Hold all hypertensive for now.  Discussed plan of care with pts' son at bedside and also with Intensivist. Dr. Leonidas Romberg.   All the records are reviewed and case discussed with ED provider. Management plans discussed with the patient, family and they are in agreement.  CODE STATUS: Full code  TOTAL Critical Care TIME TAKING CARE OF THIS PATIENT: 50 minutes.    Henreitta Leber M.D on 01/19/2017 at 8:49 AM  Between 7am to 6pm - Pager - [phone]  After 6pm go to [email or web address] - password [email or web address]  Office  [phone]  CC: Primary care physician; Valerie Roys, DO

## 2017-01-20 NOTE — Consult Note (Signed)
Consultation Note Date: 01/23/2017   Patient Name: Theresa Vasquez  DOB: 27-Mar-1931  MRN: [id number]  Age / Sex: 84 y.o., female  PCP: Theresa Roys, DO Referring Physician: Henreitta Leber, MD  Reason for Consultation: Establishing goals of care  HPI/Patient Profile: 83 y.o. female  with past medical history of chronic anemia, a fib, anxiety, vascular dementia, diabetes, HTN, CHF, COPD, hypothyroidism, PVD admitted on 02/11/2017 with increasing SOB and respiratory distress requiring intubation. She is requiring IV pressor titration for hemostasis. She was found to be hypoxic and hypercapnic with leucocytosis and elevated lactic acid, hypotensive and hypothermic. She is sedated on fentanyl. Notably, she was discharged home on 7/3 from hospitalization for MSSA bacteremia and pneumonia with PICC placed for ongoing IV antibiotics. SNF was recommended, but was declined and she was discharged home with 24 hr caregiver as alternative.  Clinical Assessment and Goals of Care:  I have reviewed medical records including EPIC notes, labs and imaging, received report from [place] with Pulmonology, assessed the patient and then met at the bedside with patient's son, Theresa Vasquez to discuss diagnosis prognosis, [place], EOL wishes, disposition and options.  I introduced Palliative Medicine as specialized medical care for people living with serious illness. It focuses on providing relief from the symptoms and stress of a serious illness. The goal is to improve quality of life for both the patient and the family.  We discussed a brief life review of the patient. She has been living with 24 hr caregivers in her home for several years. Son feels she has been "getting ready to go see Jesus" and her daughter who died eleven years ago for quite some time now. Theresa Vasquez tells me patient would not want to suffer and has always  said she would never want to be in a nursing facility.   I assessed functional and nutritional status at home by gathering history. We discussed patient's current illness and what it means in the larger context of their on-going co-morbidities.  Natural disease trajectory and expectations at EOL were discussed. Patient had hip fracture in November and has had ongoing decline since. She has had bed to chair/recliner existence deriving some joy from puzzles and reading and tv, but even this has declined in the last three months. She is dependent for all ADLs. [name] recognizes patient's decline and likelihood that she will not recover meaning functional status, even if she is to survive intubation it is likely she will require skilled facility which is not in line with patient's stated GOC.   I attempted to elicit values and goals of care important to the patient. Patient has expressed that she would not want to be kept alive long term by artifical means- by ventilator, or with artificial feeding.   The difference between aggressive medical intervention and comfort care was explained. Comfort care would include stopping ongoing life sustaining treatment including IV fluids, pressors, compassionate extubation without reintubation and supporting patient through peaceful natural death.   Primary Decision Maker NEXT  OF KIN - son- Theresa Vasquez    SUMMARY OF RECOMMENDATIONS -Continue current care -PMT will continue to follow and support family holistically -Planned follow-up meeting with family on 7/5 at [place]:  DNR   Additional Recommendations (Limitations, Scope, Preferences):  No Tracheostomy  Prognosis:    Unable to determine  Discharge Planning: To Be Determined  Primary Diagnoses: Present on Admission: . Sepsis ([place])   I have reviewed the medical record, interviewed the patient and family, and examined the patient. The following aspects  are pertinent.  Past Medical History:  Diagnosis Date  . Adenomatous polyps   . Age-related osteoporosis without current pathological fracture   . Anemia   . Anxiety   . Atherosclerosis of aorta ([place])   . Atrial fibrillation with RVR ([place])   . Barrett's esophagus without dysplasia   . CHF (congestive heart failure) ([place])   . Colitis   . COPD (chronic obstructive pulmonary disease) ([place])   . Dementia   . Depression   . Diabetes mellitus type II, uncontrolled ([place])   . Diverticulitis   . Diverticulosis of intestine without perforation or abscess without bleeding   . Edema   . Essential hemorrhagic thrombocythemia ([place])   . Gastrointestinal hemorrhage   . High anion gap metabolic acidosis   . Hyperlipidemia   . Hypertension   . Hypertensive chronic kidney disease with stage 1 through stage 4 chronic kidney disease, or unspecified chronic kidney disease   . Hypothyroidism   . Hypoxemia   . Iron deficiency anemia   . Lactic acidosis   . Left bundle branch block   . Nonrheumatic mitral valve insufficiency   . Osteoarthritis   . Other psychoactive substance dependence, in remission ([place])   . Other specified symptoms and signs involving the circulatory and respiratory systems   . Paroxysmal atrial fibrillation (H[facility])    s/p cardioversion  . Personal history of peptic ulcer disease   . Pulmonary edema   . PVD (peripheral vascular disease) ([place]) 05/14/2015  . Recurrent pneumonia    history of  . Respiratory failure with hypoxia and hypercapnia ([place])   . Type 2 diabetes mellitus with diabetic chronic kidney disease ([place])   . Type 2 diabetes mellitus with diabetic polyneuropathy ([place])   . Vascular dementia without behavioral disturbance   . Vascular disorder of intestine [facility])    Social History   Social History  . Marital status: Divorced    Spouse name: N/A  . Number of children: N/A  . Years of education: N/A   Social History Main Topics  . Smoking status: Former Smoker      Packs/day: 1.00    Years: 60.00    Types: Cigarettes  . Smokeless tobacco: Never Used  . Alcohol use No  . Drug use: No  . Sexual activity: Not on file   Other Topics Concern  . Not on file   Social History Narrative   Lives at home, has 24 x 7 caregiver and ambulates with walker.   Family History  Problem Relation Age of Onset  . Diabetes Mother   . Diabetes Father    Scheduled Meds: . aspirin  81 mg Per Tube Daily  . budesonide (PULMICORT) nebulizer solution  0.25 mg Nebulization BID  . donepezil  10 mg Per Tube QHS  . enoxaparin (LOVENOX) injection  40 mg Subcutaneous Q24H  . feeding supplement (PRO-STAT SUGAR FREE 64)  30 mL Per Tube BID  . feeding supplement (  VITAL HIGH PROTEIN)  1,000 mL Per Tube Q24H  . fentaNYL (SUBLIMAZE) injection  50 mcg Intravenous Once  . ferrous sulfate  220 mg Oral BID WC  . gabapentin  400 mg Per Tube BID  . insulin aspart  0-15 Units Subcutaneous Q4H  . ipratropium-albuterol  3 mL Nebulization Q6H  . [START ON 01/21/2017] levothyroxine  150 mcg Per Tube Daily  . pantoprazole sodium  40 mg Per Tube Q1200  . Valproate Sodium  250 mg Per Tube Q8H  . venlafaxine  37.5 mg Per Tube Daily   Continuous Infusions: . ceFEPime (MAXIPIME) IV    . fentaNYL infusion INTRAVENOUS 200 mcg/hr (01/30/2017 1316)  . norepinephrine (LEVOPHED) Adult infusion    . vancomycin     PRN Meds:.acetaminophen **OR** acetaminophen, fentaNYL, ondansetron **OR** ondansetron (ZOFRAN) IV, senna-docusate, sodium chloride flush Medications Prior to Admission:  Prior to Admission medications   Medication Sig Start Date End Date Taking? Authorizing Provider  acetaminophen (TYLENOL) 500 MG tablet Take 1 tablet (500 mg total) by mouth every 4 (four) hours as needed for mild pain, fever or headache. 07/23/16  Yes Gladstone Lighter, MD  ADVAIR DISKUS 250-50 MCG/DOSE AEPB INHALE ONE PUFF TWICE DAILY 12/08/16  Yes Johnson, Megan P, DO  albuterol (PROVENTIL) (2.5 MG/3ML) 0.083%  nebulizer solution Take 3 mLs (2.5 mg total) by nebulization every 4 (four) hours as needed for wheezing or shortness of breath. 11/26/15  Yes Johnson, Megan P, DO  amiodarone (PACERONE) 100 MG tablet Take 100 mg by mouth daily.    Yes [provider]  amLODipine (NORVASC) 5 MG tablet TAKE ONE TABLET TWICE DAILY 10/06/16  Yes Johnson, Megan P, DO  aspirin EC 81 MG tablet Take 81 mg by mouth daily.    Yes [provider]  Cholecalciferol (VITAMIN D-3) 1000 units CAPS Take 1 capsule (1,000 Units total) by mouth daily. 11/26/15  Yes Johnson, Megan P, DO  divalproex (DEPAKOTE) 250 MG DR tablet TAKE ONE TABLET BY MOUTH 3 TIMES DAILY 10/06/16  Yes Johnson, Megan P, DO  donepezil (ARICEPT) 10 MG tablet Take 1 tablet (10 mg total) by mouth at bedtime. 11/26/15  Yes Johnson, Megan P, DO  ferrous sulfate 325 (65 FE) MG tablet TAKE ONE TABLET BY MOUTH TWICE DAILY WITH A MEAL 12/11/16  Yes Johnson, Megan P, DO  furosemide (LASIX) 40 MG tablet TAKE ONE TABLET TWICE DAILY 12/11/16  Yes Johnson, Megan P, DO  gabapentin (NEURONTIN) 400 MG capsule Take 1 capsule (400 mg total) by mouth 2 (two) times daily. 12/01/16  Yes Johnson, Megan P, DO  levothyroxine (SYNTHROID, LEVOTHROID) 150 MCG tablet TAKE ONE (1) TABLET BY MOUTH EVERY DAY BEFORE BREAKFAST 12/02/16  Yes Johnson, Megan P, DO  lisinopril (PRINIVIL,ZESTRIL) 20 MG tablet TAKE ONE (1) TABLET EACH DAY 12/01/16  Yes Johnson, Megan P, DO  Melatonin 3 MG TABS Take 1 tablet (3 mg total) by mouth at bedtime. 05/28/15  Yes Johnson, Megan P, DO  metoprolol tartrate (LOPRESSOR) 25 MG tablet Take 1 tablet by mouth every 12 (twelve) hours. 01/11/17  Yes [provider]  pantoprazole (PROTONIX) 40 MG tablet Take 40 mg by mouth daily.   Yes [provider]  QUEtiapine (SEROQUEL) 25 MG tablet Take 1 tablet (25 mg total) by mouth at bedtime. 11/26/15  Yes Johnson, Megan P, DO  senna-docusate (SENOKOT-S) 8.6-50 MG tablet Take 1 tablet by mouth at bedtime as  needed for mild constipation. 06/23/16  Yes Demetrios Loll, MD  [place] 18 MCG  inhalation capsule INHALE CONTENTS OF ONE CAPSULE ONCE A DAY AS DIRECTED 12/08/16  Yes Johnson, Megan P, DO  venlafaxine XR (EFFEXOR-XR) 37.5 MG 24 hr capsule TAKE 1 CAPSULE BY MOUTH EVERY DAY WITH BREAKFAST 09/30/16  Yes Johnson, Megan P, DO  ceFAZolin (ANCEF) 2-4 GM/100ML-% IVPB Inject 100 mLs (2 g total) into the vein every 8 (eight) hours. 01/19/17   Theresa Leber, MD  ceFAZolin (ANCEF) IVPB Inject 2 g into the vein every 8 (eight) hours. Indication:  bacteremia Last Day of Therapy:  02/16/2017 Labs - Once weekly:  CBC/D and BMP, Labs - Every other week:  ESR and CRP 01/19/17   Sainani, Belia Heman, MD  MICROLET LANCETS MISC TEST BLOOD GLUCOSE LEVEL ONCE DAILY 04/10/16   Park Liter P, DO   No Known Allergies Review of Systems  Unable to perform ROS: Intubated    Physical Exam  Constitutional: She appears well-developed and well-nourished. No distress.  Cardiovascular:  Tachycardic, sig BLE edema  Pulmonary/Chest:  intubated  Musculoskeletal:  Soft heel boots in place on BLE  Neurological:  unresponsive  Skin: There is pallor.  Nursing note and vitals reviewed.   Vital Signs: BP (!) 90/52   Pulse (!) 113   Temp 98.8 F (37.1 C)   Resp 14   SpO2 91%          SpO2: SpO2: 91 % O2 Device:SpO2: 91 % O2 Flow Rate: .   IO: Intake/output summary:  Intake/Output Summary (Last 24 hours) at 01/19/2017 1352 Last data filed at 02/05/2017 1003  Gross per 24 hour  Intake                8 ml  Output               50 ml  Net              -42 ml    LBM:   Baseline Weight:   Most recent weight:       Palliative Assessment/Data: PPS: 10%     Thank you for this consult. Palliative medicine will continue to follow and assist as needed.   Time Total:80 minutes Greater than 50%  of this time was spent counseling and coordinating care related to the above assessment and plan.  Signed by: Mariana Kaufman, AGNP-C Palliative Medicine    Please contact Palliative Medicine Team phone at [phone] for questions and concerns.  For individual provider: See Shea Evans

## 2017-01-20 NOTE — ED Notes (Signed)
Prior to foley insertion, large BM noted in brief-soft and dark in color, cleaned by EDTs.

## 2017-01-20 NOTE — Progress Notes (Signed)
Initial Nutrition Assessment  DOCUMENTATION CODES:   Obesity unspecified  INTERVENTION:   Tube feeds via OGT- Vital HP at goal rate of 53ml/hr  Prostat liquid protein PO 30 ml BID via OGT, each supplement provides 100 kcal, 15 grams protein.  Free water flushes 24ml every 6 hrs  Regimen provides 1400kcal/day, 135g/day protein, 1343ml free water  Pt is at low risk for refeeding  NUTRITION DIAGNOSIS:   Inadequate oral intake related to acute illness (PNA, COPD, pt ventilated ) as evidenced by NPO status.  GOAL:   Provide needs based on ASPEN/SCCM guidelines  MONITOR:   Diet advancement, Vent status, Labs, Weight trends, Skin, I & O's  REASON FOR ASSESSMENT:   Consult Enteral/tube feeding initiation and management  ASSESSMENT:   83 yo female with a PMH of Vascular Disorder of Intestine, Vascular Dementia, Type II DM, Recurrent Pneumonia, PVD, PUD, Paroxysmal Atrial Fibrillation, Psychoactive Substance Abuse, Osteoarthritis, Mitral Valve Insufficiency, LBBB, Hypothyroidism, HTN, Hyperlipidemia, Gastrointestinal Hemorrhage, CKD, Diverticulosis, Depression, COPD, CHF, and Adenomatous Polyps. She presented to [facility] ER 07/4 via EMS unresponsive with respiratory distress.  Per ER notes EMS reported O2 sats upon arrival were in the 50's requiring bag mask ventilation with O2 sats improving to the 80's.  The pt was discharged from [facility] 07/3 with a PICC line in place for IV antibiotics due to a diagnosis of staph pneumonia.  At baseline she is bed bound and requires total care. Due to presenting symptoms pt required mechanical intubation and was admitted to the ICU by hospitalist team PCCM consulted for management.     Spoke to caregiver at bedside who reports that pt is an independent eater at baseline and usually eats well at home. Caregiver reports that there is nothing that the pt will not eat and that recently she has had weight gain and no weight loss. Per chart, pt is weight stable.  Per caregiver, pt does not drink Ensure; they have never offered it to her. Pt is served meals sitting up with a bedside table while she watches television; pt is unable to prepare meals herself as she is bed bound. Can start tube feeds at goal rate as pt is at low risk for refeeding.     Patient is currently intubated on ventilator support MV: 6.5 L/min Temp (24hrs), Avg:98.8 F (37.1 C), Min:98.8 F (37.1 C), Max:98.8 F (37.1 C)  Propofol: none   MAP 12mmHg   Medications reviewed and include: aspirin, lovenox, fentanyl, ferrous sulfate, insulin, synthroid, protonix, cefepime, levophed, vancomycin  Labs reviewed: K 4.1 wnl, Cl 97(L), Ca 8.1(L) Wbc- 19.3(H) Hgb 8.6(L), Hct 28.8(L) cbgs- 176, 377 x 48hrs AIC 5.0 5/15  Nutrition-Focused physical exam completed. Findings are no fat depletion, no muscle depletion, and moderate edema in BLE.   Diet Order:  Diet NPO time specified  Skin:  Wound (see comment) (Stage II foot)  Last BM:  6/28  Height:   Ht Readings from Last 1 Encounters:  01/16/17 5\' 7"  (1.702 m)    Weight:   Wt Readings from Last 1 Encounters:  01/16/17 199 lb 15.3 oz (90.7 kg)    Ideal Body Weight:  61.3 kg  BMI:  There is no height or weight on file to calculate BMI.  Estimated Nutritional Needs:   Kcal:  1000-1300kcal/day   Protein:  123-135g/day   Fluid:  >1.3L/day   EDUCATION NEEDS:   Education needs no appropriate at this time  Koleen Distance MS, RD, [place] Pager #[phone] After Hours Pager: [phone]

## 2017-01-20 NOTE — ED Notes (Addendum)
PT BIB EMS from home d/t call for Respiratory distress. Pt d/c from [facility] 7/3 1900 with PICC line for IV antibiotics with home health. Pt caregiver noticed distress this morning. Labored Respirations on scene with room air sat in 50s, pt unresponsive, same with 8L O2 [place]. Pt remains unresponsive in route, BVM ventilation began O2 91%. Other vitals WNL.

## 2017-01-20 NOTE — ED Notes (Signed)
Pt came in with soft boots on each foot for bilateral pressure wounds.

## 2017-01-20 NOTE — ED Notes (Signed)
ET Tube placed by MD Jilda Panda RT assist.  7.5, 22 @ lip, (+) color change and breath sounds noted.

## 2017-01-20 NOTE — Progress Notes (Signed)
Patient transfered to ICU room 8, while on the ventilator, with no complications noted.

## 2017-01-20 NOTE — Progress Notes (Signed)
Pharmacy Antibiotic Note  Theresa Vasquez is a 83 y.o. female recently discharged on 7/3 with IV cefazolin for MRSA bacteremia and PNA  admitted on 01/29/2017 with sepsis.  Pharmacy has been consulted for vancomycin and cefepime dosing.  Plan: Ke= 0.045 h-1  Vd= 51.1 L  Vancomycin 1000 mg iv once then 1250 mg iv q 24 hours with stacked dosing and a trough with the 4th dose for a goal of 15-20 mcg/ml.   Cefepime 2 g iv once then 1 g iv q 12 hours.      Temp (24hrs), Avg:97.7 F (36.5 C), Min:97.7 F (36.5 C), Max:97.7 F (36.5 C)   Recent Labs Lab 01/15/17 1339 01/15/17 1346 01/15/17 1650 01/16/17 0333 01/17/17 0531 01/17/17 2157 01/18/17 0402 01/27/2017 0700 01/29/2017 0732  WBC 17.1*  --   --  10.0 8.7  --  7.2 19.3*  --   CREATININE 0.81  --   --  0.70 0.72 0.63 0.67 1.00  --   LATICACIDVEN  --  2.4* 2.6*  --   --   --   --   --  5.0*    Estimated Creatinine Clearance: 48.4 mL/min (by C-G formula based on SCr of 1 mg/dL).    No Known Allergies  Antimicrobials this admission: cefepime 7/4 >>  vancomycin 7/4 >>   Dose adjustments this admission:   Microbiology results: UCx: sent  Thank you for allowing pharmacy to be a part of this patient's care.  Ulice Dash D 01/19/2017 9:39 AM

## 2017-01-20 NOTE — ED Notes (Signed)
PROPOFOL STOPPED.

## 2017-01-20 NOTE — ED Provider Notes (Signed)
[facility] Emergency Department Provider Note  ____________________________________________  Time seen: Approximately 7:54 AM  I have reviewed the triage vital signs and the nursing notes.   HISTORY  Chief Complaint Respiratory Distress  Level 5 caveat:  Portions of the history and physical were unable to be obtained due to: Unresponsive   HPI Theresa Vasquez is a 83 y.o. female brought to the ED via EMS due to unresponsiveness and respiratory distress. EMS reported an oxygen saturation in the 50s, so they are bagging the patient to maintain an oxygen saturation in the 80s. Additional history is obtained by son when he arrived to the bedside, reports that she had recently been in the hospital, discharged yesterday, being treated for infection of unclear source. Review of electronic medical records shows that she had a blood culture positive for staph. Was being treated for pneumonia. Had been on BiPAP at night while in the hospital, but last night at home she was just on her usual nasal cannula oxygen.     Past Medical History:  Diagnosis Date  . Adenomatous polyps   . Age-related osteoporosis without current pathological fracture   . Anemia   . Anxiety   . Atherosclerosis of aorta ([place])   . Atrial fibrillation with RVR ([place])   . Barrett's esophagus without dysplasia   . CHF (congestive heart failure) ([place])   . Colitis   . COPD (chronic obstructive pulmonary disease) ([place])   . Dementia   . Depression   . Diabetes mellitus type II, uncontrolled ([place])   . Diverticulitis   . Diverticulosis of intestine without perforation or abscess without bleeding   . Edema   . Essential hemorrhagic thrombocythemia ([place])   . Gastrointestinal hemorrhage   . High anion gap metabolic acidosis   . Hyperlipidemia   . Hypertension   . Hypertensive chronic kidney disease with stage 1 through stage 4 chronic kidney disease, or unspecified chronic kidney disease   .  Hypothyroidism   . Hypoxemia   . Iron deficiency anemia   . Lactic acidosis   . Left bundle branch block   . Nonrheumatic mitral valve insufficiency   . Osteoarthritis   . Other psychoactive substance dependence, in remission ([place])   . Other specified symptoms and signs involving the circulatory and respiratory systems   . Paroxysmal atrial fibrillation (HCC)    s/p cardioversion  . Personal history of peptic ulcer disease   . Pulmonary edema   . PVD (peripheral vascular disease) ([place]) 05/14/2015  . Recurrent pneumonia    history of  . Respiratory failure with hypoxia and hypercapnia ([place])   . Type 2 diabetes mellitus with diabetic chronic kidney disease ([place])   . Type 2 diabetes mellitus with diabetic polyneuropathy ([place])   . Vascular dementia without behavioral disturbance   . Vascular disorder of intestine [facility])      Patient Active Problem List   Diagnosis Date Noted  . Acute on chronic respiratory failure ([place]) 01/15/2017  . HTN (hypertension) 08/11/2016  . Pressure injury of skin 07/21/2016  . Closed left hip fracture ([place]) 06/17/2016  . Acute on chronic respiratory failure with hypoxia ([place]) 05/18/2016  . Pneumonia 07/18/2015  . Paroxysmal atrial fibrillation ([place]) 05/28/2015  . Type 2 DM with CKD stage 2 and hypertension ([place]) 05/14/2015  . COPD, severe ([place]) 05/14/2015  . Benign hypertensive renal disease 05/14/2015  . Anxiety disorder 05/14/2015  . Diverticulosis 05/14/2015  . Abnormality of gait 05/14/2015  . Barrett's esophagus 05/14/2015  .  Drug addiction in remission ([place]) 05/14/2015  . Mitral regurgitation 05/14/2015  . LBBB (left bundle branch block) 05/14/2015  . Carotid bruit 05/14/2015  . Ischemic colitis ([place]) 05/14/2015  . History of peptic ulcer disease 05/14/2015  . Chronic diastolic CHF (congestive heart failure) ([place]) 05/14/2015  . Atherosclerosis of aorta ([place]) 05/14/2015  . Involutional osteoporosis 05/14/2015  . Essential thrombocythemia  ([place]) 05/14/2015  . H/O recurrent pneumonia 05/14/2015  . Heart & renal disease, hypertensive, with heart failure ([place]) 05/14/2015  . Adult hypothyroidism 05/14/2015  . Peripheral vascular disease ([place]) 05/14/2015  . Vascular dementia with behavioral disturbance 05/14/2015  . Vascular disorder of intestine ([place]) 05/14/2015  . Iron deficiency anemia 11/24/2014  . Alveolar hypoventilation 06/05/2014  . Arthritis, degenerative 02/20/2014     Past Surgical History:  Procedure Laterality Date  . ABDOMINAL HYSTERECTOMY     partial  . APPENDECTOMY    . COLONOSCOPY    . ESOPHAGOGASTRODUODENOSCOPY    . ESOPHAGOGASTRODUODENOSCOPY (EGD) WITH PROPOFOL N/A 07/04/2015   Procedure: ESOPHAGOGASTRODUODENOSCOPY (EGD) WITH PROPOFOL;  Surgeon: Manya Silvas, MD;  Location: [facility] ENDOSCOPY;  Service: Endoscopy;  Laterality: N/A;  . HIP SURGERY Left 06/2016   Rod placed   . INTRAMEDULLARY (IM) NAIL INTERTROCHANTERIC Left 06/18/2016   Procedure: INTRAMEDULLARY (IM) NAIL INTERTROCHANTRIC;  Surgeon: Dereck Leep, MD;  Location: [facility] ORS;  Service: Orthopedics;  Laterality: Left;  . SHOULDER ARTHROSCOPY    . SIGMOIDOSCOPY       Prior to Admission medications   Medication Sig Start Date End Date Taking? Authorizing Provider  acetaminophen (TYLENOL) 500 MG tablet Take 1 tablet (500 mg total) by mouth every 4 (four) hours as needed for mild pain, fever or headache. 07/23/16   Gladstone Lighter, MD  ADVAIR DISKUS 250-50 MCG/DOSE AEPB INHALE ONE PUFF TWICE DAILY 12/08/16   Park Liter P, DO  albuterol (PROVENTIL) (2.5 MG/3ML) 0.083% nebulizer solution Take 3 mLs (2.5 mg total) by nebulization every 4 (four) hours as needed for wheezing or shortness of breath. 11/26/15   Johnson, Megan P, DO  amiodarone (PACERONE) 100 MG tablet Take 100 mg by mouth daily.     [provider]  amLODipine (NORVASC) 5 MG tablet TAKE ONE TABLET TWICE DAILY 10/06/16   Park Liter P, DO  aspirin EC 81 MG tablet Take 81  mg by mouth daily.     [provider]  ceFAZolin (ANCEF) 2-4 GM/100ML-% IVPB Inject 100 mLs (2 g total) into the vein every 8 (eight) hours. 01/19/17   Henreitta Leber, MD  ceFAZolin (ANCEF) IVPB Inject 2 g into the vein every 8 (eight) hours. Indication:  bacteremia Last Day of Therapy:  02/16/2017 Labs - Once weekly:  CBC/D and BMP, Labs - Every other week:  ESR and CRP 01/19/17   Sainani, Belia Heman, MD  Cholecalciferol (VITAMIN D-3) 1000 units CAPS Take 1 capsule (1,000 Units total) by mouth daily. 11/26/15   Johnson, Megan P, DO  divalproex (DEPAKOTE) 250 MG DR tablet TAKE ONE TABLET BY MOUTH 3 TIMES DAILY 10/06/16   Wynetta Emery, Megan P, DO  donepezil (ARICEPT) 10 MG tablet Take 1 tablet (10 mg total) by mouth at bedtime. 11/26/15   Johnson, Megan P, DO  ferrous sulfate 325 (65 FE) MG tablet TAKE ONE TABLET BY MOUTH TWICE DAILY WITH A MEAL 12/11/16   Johnson, Megan P, DO  furosemide (LASIX) 40 MG tablet TAKE ONE TABLET TWICE DAILY 12/11/16   Johnson, Megan P, DO  gabapentin (NEURONTIN) 400 MG capsule Take  1 capsule (400 mg total) by mouth 2 (two) times daily. 12/01/16   Johnson, Megan P, DO  levothyroxine (SYNTHROID, LEVOTHROID) 150 MCG tablet TAKE ONE (1) TABLET BY MOUTH EVERY DAY BEFORE BREAKFAST 12/02/16   Johnson, Megan P, DO  lisinopril (PRINIVIL,ZESTRIL) 20 MG tablet TAKE ONE (1) TABLET EACH DAY 12/01/16   Johnson, Megan P, DO  Melatonin 3 MG TABS Take 1 tablet (3 mg total) by mouth at bedtime. 05/28/15   Johnson, Megan P, DO  metoprolol tartrate (LOPRESSOR) 25 MG tablet Take 1 tablet by mouth every 12 (twelve) hours. 01/11/17   [provider]  MICROLET LANCETS MISC TEST BLOOD GLUCOSE LEVEL ONCE DAILY 04/10/16   Wynetta Emery, Megan P, DO  pantoprazole (PROTONIX) 40 MG tablet Take 40 mg by mouth daily.    [provider]  QUEtiapine (SEROQUEL) 25 MG tablet Take 1 tablet (25 mg total) by mouth at bedtime. 11/26/15   Johnson, Megan P, DO  senna-docusate (SENOKOT-S) 8.6-50 MG tablet Take 1  tablet by mouth at bedtime as needed for mild constipation. 06/23/16   Demetrios Loll, MD  SPIRIVA HANDIHALER 18 MCG inhalation capsule INHALE CONTENTS OF ONE CAPSULE ONCE A DAY AS DIRECTED 12/08/16   Wynetta Emery, Megan P, DO  venlafaxine XR (EFFEXOR-XR) 37.5 MG 24 hr capsule TAKE 1 CAPSULE BY MOUTH EVERY DAY WITH BREAKFAST 09/30/16   Park Liter P, DO     Allergies Patient has no known allergies.   Family History  Problem Relation Age of Onset  . Diabetes Mother   . Diabetes Father     Social History Social History  Substance Use Topics  . Smoking status: Former Smoker    Packs/day: 1.00    Years: 60.00    Types: Cigarettes  . Smokeless tobacco: Never Used  . Alcohol use No    Review of Systems Unable to obtain due to unresponsiveness  ____________________________________________   PHYSICAL EXAM:  VITAL SIGNS: ED Triage Vitals  Enc Vitals Group     BP 01/30/2017 0641 121/89     Pulse Rate 01/23/2017 0641 (!) 106     Resp 02/10/2017 0641 (!) 26     Temp --      Temp src --      SpO2 02/11/2017 0636 91 %     Weight --      Height --      Head Circumference --      Peak Flow --      Pain Score --      Pain Loc --      Pain Edu? --      Excl. in [place]? --     Vital signs reviewed, nursing assessments reviewed.   Constitutional:   Unresponsive. Eyes:   No scleral icterus. PERRL. ENT   Head:   Normocephalic and atraumatic.   Nose:   No congestion/rhinnorhea.    Mouth/Throat:   MMM, no pharyngeal erythema. No peritonsillar mass.    Neck:   No meningismus. Full ROM Hematological/Lymphatic/Immunilogical:   No cervical lymphadenopathy. Cardiovascular:   RRR. Symmetric bilateral radial and DP pulses.  No murmurs.  Respiratory:  Spontaneous respirations, shallow. Tachypnea with a rate in the 30s. Diffuse expiratory wheezing. Gastrointestinal:   Soft and nontender. Non distended. There is no CVA tenderness.  No rebound, rigidity, or guarding. Genitourinary:    deferred Musculoskeletal:   Normal range of motion in all extremities. No joint effusions.  No lower extremity tenderness.  No edema. Neurologic:   Unresponsive.  Skin:  Skin is warm, dry with a skin tear on the left upper arm No rash noted.  No petechiae, purpura, or bullae.  ____________________________________________    LABS (pertinent positives/negatives) (all labs ordered are listed, but only abnormal results are displayed) Labs Reviewed  CBC WITH DIFFERENTIAL/PLATELET - Abnormal; Notable for the following:       Result Value   WBC 19.3 (*)    RBC 3.07 (*)    Hemoglobin 8.6 (*)    HCT 28.8 (*)    MCHC 29.8 (*)    RDW 16.1 (*)    Platelets 652 (*)    All other components within normal limits  URINE CULTURE  BASIC METABOLIC PANEL  TROPONIN I  URINALYSIS, COMPLETE (UACMP) WITH MICROSCOPIC  BLOOD GAS, ARTERIAL  LACTIC ACID, PLASMA  LACTIC ACID, PLASMA   ____________________________________________   EKG  Interpreted by me Atrial fibrillation, rate 105. Normal axis, slightly prolonged QTC of 504. Left bundle branch block. No acute ischemic changes.  ____________________________________________    PVVZSMOLM  Dg Chest Portable 1 View  Result Date: 02/03/2017 CLINICAL DATA:  Post intubation, unresponsive EXAM: PORTABLE CHEST 1 VIEW COMPARISON:  01/18/2017 FINDINGS: Right PICC line tip is at the cavoatrial junction. Endotracheal tube is 3 cm above the carina. NG tube enters the stomach. Heart is normal size. Bilateral airspace opacities are again noted, most pronounced in the upper lobes. This has worsened on the left since prior study. This could represent edema or infection. IMPRESSION: Bilateral airspace opacities, worsening in the left upper lobe since prior study. This could reflect worsening edema or infection. Electronically Signed   By: Rolm Baptise M.D.   On: 02/14/2017 07:24   Dg Abd Portable 1 View  Result Date: 02/04/2017 CLINICAL DATA:  OG tube placement  EXAM: PORTABLE ABDOMEN - 1 VIEW COMPARISON:  10/11/2014 FINDINGS: NG tube tip is in the proximal stomach. Nonobstructive bowel gas pattern. Moderate to large stool burden throughout the colon. No free air. IMPRESSION: NG tube tip in the proximal stomach. Moderate to large stool burden. Electronically Signed   By: Rolm Baptise M.D.   On: 01/25/2017 07:25    ____________________________________________   PROCEDURES Procedures CRITICAL CARE Performed by: Joni Fears, Lilianne Delair   Total critical care time: 35 minutes  Critical care time was exclusive of separately billable procedures and treating other patients.  Critical care was necessary to treat or prevent imminent or life-threatening deterioration.  Critical care was time spent personally by me on the following activities: development of treatment plan with patient and/or surrogate as well as nursing, discussions with consultants, evaluation of patient's response to treatment, examination of patient, obtaining history from patient or surrogate, ordering and performing treatments and interventions, ordering and review of laboratory studies, ordering and review of radiographic studies, pulse oximetry and re-evaluation of patient's condition.   INTUBATION Performed by: Joni Fears, Roise Emert  Required items: required blood products, implants, devices, and special equipment available Patient identity confirmed: provided demographic data and hospital-assigned identification number Time out: Immediately prior to procedure a "time out" was called to verify the correct patient, procedure, equipment, support staff and site/side marked as required.  Indications: Acute respiratory failure   Intubation method: Glidescope Laryngoscopy   Preoxygenation: BVM, oxygen saturation 98% prior to intubation   Sedatives: Ketamine 160 mg Paralytic: Rocuronium 80 mg  Tube Size: 7.5 cuffed, inserted to 22 cm at the lip   Post-procedure assessment: chest rise and  ETCO2 monitor Breath sounds: equal and absent over the epigastrium Tube secured with: ETT  holder Chest x-ray interpreted by radiologist and me.  Chest x-ray findings: endotracheal tube in appropriate position. Worsening pulmonary edema versus infection   Patient tolerated the procedure well with no immediate complications.    ____________________________________________   INITIAL IMPRESSION / ASSESSMENT AND PLAN / ED COURSE  Pertinent labs & imaging results that were available during my care of the patient were reviewed by me and considered in my medical decision making (see chart for details).  Presents with unresponsiveness and hypoxia. Intubated on arrival. Pulmonary edema versus pneumonia, will follow-up labs and x-ray  Clinical Course as of Jan 20 753  Wed Jan 20, 2017  0729 Hypotensive after intubation. Started levophed.  [PS]  [id number] Cxr suggests edema vs infection. With hypotension, will tx as HCAP despite clinically uncertain presentation due to unresponsiveness and pt already being on abx via PICC DG Chest Portable 1 View [PS]    Clinical Course User Index [PS] Carrie Mew, MD    ----------------------------------------- 8:00 AM on 01/21/2017 -----------------------------------------  White blood cell count significantly elevated, again consistent with emerging picture of septic shock. Blood pressure stabilized on levothyroid. Case discussed with hospitalist for admission to ICU.   ____________________________________________   FINAL CLINICAL IMPRESSION(S) / ED DIAGNOSES  Final diagnoses:  HCAP (healthcare-associated pneumonia)  Acute respiratory failure with hypoxia ([place])  Septic shock ([place])      New Prescriptions   No medications on file     Portions of this note were generated with dragon dictation software. Dictation errors may occur despite best attempts at proofreading.    Carrie Mew, MD 01/19/2017 [phone]

## 2017-01-20 NOTE — ED Notes (Signed)
Skin tear to left forearm in route with EMS. Cleaned and covered by Rush Landmark, RN

## 2017-01-21 ENCOUNTER — Inpatient Hospital Stay: Payer: Medicare Other

## 2017-01-21 DIAGNOSIS — I482 Chronic atrial fibrillation: Secondary | ICD-10-CM

## 2017-01-21 DIAGNOSIS — J189 Pneumonia, unspecified organism: Secondary | ICD-10-CM

## 2017-01-21 DIAGNOSIS — R579 Shock, unspecified: Secondary | ICD-10-CM

## 2017-01-21 LAB — GLUCOSE, CAPILLARY
GLUCOSE-CAPILLARY: 200 mg/dL — AB (ref 65–99)
Glucose-Capillary: 173 mg/dL — ABNORMAL HIGH (ref 65–99)
Glucose-Capillary: 180 mg/dL — ABNORMAL HIGH (ref 65–99)
Glucose-Capillary: 188 mg/dL — ABNORMAL HIGH (ref 65–99)
Glucose-Capillary: 196 mg/dL — ABNORMAL HIGH (ref 65–99)
Glucose-Capillary: 198 mg/dL — ABNORMAL HIGH (ref 65–99)

## 2017-01-21 LAB — CULTURE, BLOOD (ROUTINE X 2)
CULTURE: NO GROWTH
Culture: NO GROWTH
Special Requests: ADEQUATE

## 2017-01-21 LAB — COMPREHENSIVE METABOLIC PANEL
ALBUMIN: 2.5 g/dL — AB (ref 3.5–5.0)
ALK PHOS: 41 U/L (ref 38–126)
ALT: 12 U/L — AB (ref 14–54)
ANION GAP: 7 (ref 5–15)
AST: 27 U/L (ref 15–41)
BUN: 25 mg/dL — ABNORMAL HIGH (ref 6–20)
CALCIUM: 7.9 mg/dL — AB (ref 8.9–10.3)
CHLORIDE: 100 mmol/L — AB (ref 101–111)
CO2: 31 mmol/L (ref 22–32)
Creatinine, Ser: 1.14 mg/dL — ABNORMAL HIGH (ref 0.44–1.00)
GFR calc Af Amer: 50 mL/min — ABNORMAL LOW (ref >60)
GFR calc non Af Amer: 43 mL/min — ABNORMAL LOW (ref >60)
GLUCOSE: 200 mg/dL — AB (ref 65–99)
Potassium: 4.2 mmol/L (ref 3.5–5.1)
Sodium: 138 mmol/L (ref 135–145)
Total Bilirubin: 0.5 mg/dL (ref 0.3–1.2)
Total Protein: 6.4 g/dL — ABNORMAL LOW (ref 6.5–8.1)

## 2017-01-21 LAB — URINE CULTURE: CULTURE: NO GROWTH

## 2017-01-21 LAB — CBC
HEMATOCRIT: 25.1 % — AB (ref 35.0–47.0)
HEMOGLOBIN: 7.8 g/dL — AB (ref 12.0–16.0)
MCH: 28.6 pg (ref 26.0–34.0)
MCHC: 31.2 g/dL — ABNORMAL LOW (ref 32.0–36.0)
MCV: 91.7 fL (ref 80.0–100.0)
Platelets: 587 10*3/uL — ABNORMAL HIGH (ref 150–440)
RBC: 2.74 MIL/uL — ABNORMAL LOW (ref 3.80–5.20)
RDW: 15.9 % — AB (ref 11.5–14.5)
WBC: 9.8 10*3/uL (ref 3.6–11.0)

## 2017-01-21 LAB — PROCALCITONIN: PROCALCITONIN: 0.62 ng/mL

## 2017-01-21 LAB — VANCOMYCIN, TROUGH: Vancomycin Tr: 12 ug/mL — ABNORMAL LOW (ref 15–20)

## 2017-01-21 LAB — TROPONIN I

## 2017-01-21 MED ORDER — ORAL CARE MOUTH RINSE
15.0000 mL | Freq: Four times a day (QID) | OROMUCOSAL | Status: DC
  Administered 2017-01-21 (×2): 15 mL via OROMUCOSAL

## 2017-01-21 MED ORDER — BUDESONIDE 0.25 MG/2ML IN SUSP
0.2500 mg | Freq: Four times a day (QID) | RESPIRATORY_TRACT | Status: DC
  Administered 2017-01-21 – 2017-01-24 (×13): 0.25 mg via RESPIRATORY_TRACT
  Filled 2017-01-21 (×13): qty 2

## 2017-01-21 MED ORDER — CHLORHEXIDINE GLUCONATE 0.12% ORAL RINSE (MEDLINE KIT)
15.0000 mL | Freq: Two times a day (BID) | OROMUCOSAL | Status: DC
  Administered 2017-01-21 – 2017-01-24 (×7): 15 mL via OROMUCOSAL

## 2017-01-21 MED ORDER — VITAL HIGH PROTEIN PO LIQD
1000.0000 mL | ORAL | Status: DC
  Administered 2017-01-21 – 2017-01-23 (×2): 1000 mL

## 2017-01-21 MED ORDER — LORAZEPAM 2 MG/ML IJ SOLN
0.2500 mg | INTRAMUSCULAR | Status: DC | PRN
  Administered 2017-01-21 – 2017-01-23 (×2): 0.25 mg via INTRAVENOUS
  Filled 2017-01-21 (×2): qty 1

## 2017-01-21 MED ORDER — FUROSEMIDE 10 MG/ML IJ SOLN
40.0000 mg | Freq: Once | INTRAMUSCULAR | Status: AC
  Administered 2017-01-21: 40 mg via INTRAVENOUS
  Filled 2017-01-21: qty 4

## 2017-01-21 MED ORDER — GABAPENTIN 250 MG/5ML PO SOLN
200.0000 mg | Freq: Two times a day (BID) | ORAL | Status: DC
  Administered 2017-01-21 – 2017-01-23 (×6): 200 mg
  Filled 2017-01-21 (×9): qty 4

## 2017-01-21 NOTE — Care Management (Signed)
Met with MD and Palliative nurse regarding LTAC and it has been determined that home with hospice may be the best disposition for patient. Palliative will meet with son to determine best option for everyone. RNCM will continue to follow.

## 2017-01-21 NOTE — Progress Notes (Signed)
Inpatient Diabetes Program Recommendations  AACE/ADA: New Consensus Statement on Inpatient Glycemic Control (2015)  Target Ranges:  Prepandial:   less than 140 mg/dL      Peak postprandial:   less than 180 mg/dL (1-2 hours)      Critically ill patients:  140 - 180 mg/dL   Lab Results  Component Value Date   GLUCAP 200 (H) 01/21/2017   HGBA1C 6.3 06/01/2016    Review of Glycemic Control  Results for KENEDEE, MOLESKY (MRN [id number]) as of 01/21/2017 10:04  Ref. Range 02/03/2017 15:46 02/06/2017 19:31 02/03/2017 23:39 01/21/2017 03:51 01/21/2017 07:41  Glucose-Capillary Latest Ref Range: 65 - 99 mg/dL 154 (H) 124 (H) 167 (H) 198 (H) 200 (H)    History: DM, COPD, CHF, Dementia  Home DM Meds: none  Current Insulin Orders: Novolog moderate correction scale 0-15 units q4h  Agree with current medications for blood sugar management.    Gentry Fitz, RN, BA, MHA, CDE Diabetes Coordinator Inpatient Diabetes Program  [phone] (Team Pager) [phone] ([place]) 01/21/2017 10:06 AM

## 2017-01-21 NOTE — Progress Notes (Signed)
Pt.'s ETT was pulled back 2 cm. It sits at 21 at the lips.

## 2017-01-21 NOTE — Progress Notes (Signed)
Remains intubated and sedated. FiO2 at 50%. Responds to son and family. Levophed weaned off at present time.Febrile all day. Tylenol suppository given x 2.

## 2017-01-21 NOTE — Care Management (Signed)
RNCM notified of potential need for LTAC (long-term-acute-care-hospital not Long-term Care which is private pay). Palliative to follow up with son Theresa Vasquez today. RNCM will follow palliative/son discharge disposition. RNCM [phone].

## 2017-01-21 NOTE — Progress Notes (Signed)
[facility] [place] NAME: Theresa Vasquez    MR#:  [id number]  DATE OF BIRTH:  [birth year]  SUBJECTIVE:   Patient here due to acute respiratory failure, sepsis with septic shock. Remains critically ill and intubated and on vasopressors. Continues to have fever spikes. [name] is at bedside. Patient also having some myoclonic jerks.  REVIEW OF SYSTEMS:    Review of Systems  Unable to perform ROS: Intubated    Nutrition: Tube feeds Tolerating Diet: yes Tolerating PT:  Await Eval.   DRUG ALLERGIES:  No Known Allergies  VITALS:  Blood pressure (!) 103/47, pulse (!) 111, temperature (!) 100.8 F (38.2 C), resp. rate 20, SpO2 94 %.  PHYSICAL EXAMINATION:   Physical Exam  GENERAL:  83 y.o.-year-old patient lying in bed sedated & intubated.  EYES: Pupils equal, round, reactive to light. No scleral icterus.   HEENT: Head atraumatic, normocephalic. ET and OG tube in place.  NECK:  Supple, no jugular venous distention. No thyroid enlargement, no tenderness.  LUNGS: Normal breath sounds bilaterally, no wheezing, rales, rhonchi. No use of accessory muscles of respiration.  CARDIOVASCULAR: S1, S2 normal. No murmurs, rubs, or gallops.  ABDOMEN: Soft, nontender, nondistended. Bowel sounds present. No organomegaly or mass.  EXTREMITIES: No cyanosis, clubbing or edema b/l.   Signs of chronic venous stasis bilaterally. NEUROLOGIC: Sedated and intubated  PSYCHIATRIC: Sedated and intubated SKIN: No obvious rash, lesion, or ulcer.    LABORATORY PANEL:   CBC  Recent Labs Lab 01/21/17 0540  WBC 9.8  HGB 7.8*  HCT 25.1*  PLT 587*   ------------------------------------------------------------------------------------------------------------------  Chemistries   Recent Labs Lab 01/17/17 2157  01/21/17 0540  NA 136  < > 138  K 3.8  < > 4.2  CL 98*  < > 100*  CO2 31  < > 31  GLUCOSE 173*  < > 200*  BUN 13  < > 25*  CREATININE  0.63  < > 1.14*  CALCIUM 7.8*  < > 7.9*  MG 1.9  --   --   AST  --   --  27  ALT  --   --  12*  ALKPHOS  --   --  41  BILITOT  --   --  0.5  < > = values in this interval not displayed. ------------------------------------------------------------------------------------------------------------------  Cardiac Enzymes  Recent Labs Lab 02/11/2017 1300  TROPONINI <0.03   ------------------------------------------------------------------------------------------------------------------  RADIOLOGY:  Dg Chest Port 1 View  Result Date: 01/21/2017 CLINICAL DATA:  Respiratory failure. EXAM: PORTABLE CHEST 1 VIEW COMPARISON:  01/29/2017. FINDINGS: Endotracheal tube noted with its tip at the carina. Proximal repositioning of approximately 2-3 cm suggested. NG tube noted with tip below left hemidiaphragm. Right PICC line noted with tip at cavoatrial junction. Cardiomegaly with improvement of bilateral upper lobe infiltrates/edema. Progressive bilateral lower lobe infiltrates/ edema noted. Basilar atelectasis. Small bilateral pleural effusions. IMPRESSION: 1. Endotracheal tube tip noted at the level of the carina. Proximal repositioning of approximately 2-3 cm suggested. NG tube and right PICC line stable position. 2. Cardiomegaly with improvement of bilateral upper lobe infiltrates/edema. Progressive bilateral lower lobe infiltrates/edema noted. Small bilateral pleural effusions. Critical Value/emergent results were called by telephone at the time of interpretation on 01/21/2017 at 6:26 am to nurse Tess,who verbally acknowledged these results. Electronically Signed   By: Marcello Moores  Register   On: 01/21/2017 06:28   Dg Chest Portable 1 View  Result Date: 01/27/2017 CLINICAL DATA:  Post intubation, unresponsive  EXAM: PORTABLE CHEST 1 VIEW COMPARISON:  01/18/2017 FINDINGS: Right PICC line tip is at the cavoatrial junction. Endotracheal tube is 3 cm above the carina. NG tube enters the stomach. Heart is normal size.  Bilateral airspace opacities are again noted, most pronounced in the upper lobes. This has worsened on the left since prior study. This could represent edema or infection. IMPRESSION: Bilateral airspace opacities, worsening in the left upper lobe since prior study. This could reflect worsening edema or infection. Electronically Signed   By: Rolm Baptise M.D.   On: 02/09/2017 07:24   Dg Abd Portable 1 View  Result Date: 02/04/2017 CLINICAL DATA:  OG tube placement EXAM: PORTABLE ABDOMEN - 1 VIEW COMPARISON:  10/11/2014 FINDINGS: NG tube tip is in the proximal stomach. Nonobstructive bowel gas pattern. Moderate to large stool burden throughout the colon. No free air. IMPRESSION: NG tube tip in the proximal stomach. Moderate to large stool burden. Electronically Signed   By: Rolm Baptise M.D.   On: 02/10/2017 07:25     ASSESSMENT AND PLAN:   83 year old female with a known history of Atrial fibrillation, anxiety, dementia, depression, diabetes, hypertension, history of diastolic CHF, COPD, hypothyroidism, peripheral vascular disease presented to the hospital due to shortness of breath and respiratory distress.  1. Sepsis-patient met criteria given her hypothermia, elevated lactic acid, leukocytosis. -Patient's pro calcitonin level although was normal. 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 -She was just recently discharged from the hospital on IV Ancef for MSSA bacteremia from suspected MSSA pneumonia. Given her acute illness and her hypotension continue IV vancomycin, cefepime. -Continue IV fluids, Levophed to maintain an MAP greater than 55. Follow cultures which are (-) so far - continues to have fever spikes.  ?? Repeat cultures get ID consult.   2. Acute respiratory failure with hypoxia-secondary to suspected pneumonia and also underlying pulmonary edema. -currently intubated sedated. Currently on 50% FiO2 with 5 of PEEP. Follow serial ABGs, further care as per  pulmonary. -Continue IV antibiotics for pneumonia as mentioned above.  - given one dose of IV lasix by Intensivist today.   3. CHF-acute on chronic diastolic dysfunction. Given one dose of Lasix this a.m. And will follow response.  -Her recent echocardiogram in June which showed normal ejection fraction of 55-60%.   4. Acute kidney injury-secondary to sepsis, septic shock. Continue IV fluids, vasopressors. Follow BUN and creatinine and urine output. Renal dose minutes, avoid nephrotoxins.  5. Myoclonic jerks-secondary to patient's high dose of gabapentin and acute kidney injury. Discussed with intensivist and will taper gabapentin.  5. History of dementia-continue Aricept, Depakote.  6. Chronic atrial fibrillation-currently rates labile. Continue amiodarone. -Patient is not on long-term anticoagulation due to high fall risk.  7. Hypothyroidism-continue Synthroid.  8. COPD-no acute exacerbation.  9. GERD-continue Protonix.  10. Neuropathy-continue gabapentin. Lower dose due to Myoclonic Jerking   11. Essential hypertension-patient is currently severely hypotensive and on vasopressors. Hold all hypertensive for now.  Discussed plan of care with INtensivist and also son.   All the records are reviewed and case discussed with Care Management/Social Worker. Management plans discussed with the patient, family and they are in agreement.  CODE STATUS: DNR  DVT Prophylaxis: Lovenox  TOTAL TIME TAKING CARE OF THIS PATIENT: 30 minutes.   POSSIBLE D/C IN 3-4 DAYS, DEPENDING ON CLINICAL CONDITION.   Henreitta Leber M.D on 01/21/2017 at 3:01 PM  Between 7am to 6pm - Pager - [phone]  After 6pm go to [email or web address] -  password [facility]  Office  [phone]  CC: Primary care physician; Valerie Roys, DO

## 2017-01-21 NOTE — Progress Notes (Addendum)
Occurred at 4:30pm.  [place] said silent prayer at patients door, while on rounds, and with patient being asleep.    01/21/17 1630  Clinical Encounter Type  Visited With Patient  Visit Type Initial;Spiritual support;Critical Care  Referral From Chaplain  Consult/Referral To Chaplain  Spiritual Encounters  Spiritual Needs Prayer

## 2017-01-21 NOTE — Progress Notes (Addendum)
Daily Progress Note   Patient Name: Theresa Vasquez       Date: 01/21/2017 DOB: 1931-07-14  Age: 83 y.o. MRN#: [id number] Attending Physician: Henreitta Leber, MD Primary Care Physician: Valerie Roys, DO Admit Date: 01/28/2017  Reason for Consultation/Follow-up: Establishing goals of care  Subjective: Chest xray shows progressing lower lobe infiltrates. Creatinine trending up. Fevers this morning  However, O2 requirements decreasing. Continues to require pressors for hemostasis. Discussed with Dr. Alva Garnet and Juliene Pina, RN. In addition to respiratory disease, poor functional status prior to admission, and advance dementia patient has advanced cardiac disease with aortic valve stenosis and regurgitation, mitral valve stenosis and regurg, and tricuspid valve regurgitation. This is causing significant ongoing pulmonary edema, difficult to diurese with ongoing hypotension and now creatinine trending up.  Followed up with patient's son, Theresa Vasquez. He was hopeful upon hearing MD stating "patient showing some improvement". Gently reviewed above medical information with him and big picture of overall expected trajectory and high possibility of poor outcome.   Theresa Vasquez's current GOC are to continue current care for "a few more days". His wife is at the beach and he would like an opportunity to have her return home and consult with palliative team. If patient does not improve he will transition to comfort care and continue DNR/DNI status. He stated that when his mom is extubated he would not want her reintubated.      Review of Systems  Unable to perform ROS: Intubated    Length of Stay: 1  Current Medications: Scheduled Meds:  . aspirin  81 mg Per Tube Daily  . budesonide (PULMICORT) nebulizer  solution  0.25 mg Nebulization Q6H  . chlorhexidine gluconate (MEDLINE KIT)  15 mL Mouth Rinse BID  . donepezil  10 mg Per Tube QHS  . enoxaparin (LOVENOX) injection  40 mg Subcutaneous Q24H  . feeding supplement (PRO-STAT SUGAR FREE 64)  30 mL Per Tube BID  . fentaNYL (SUBLIMAZE) injection  50 mcg Intravenous Once  . ferrous sulfate  220 mg Oral BID WC  . free water  75 mL Per Tube Q6H  . furosemide  40 mg Intravenous Once  . gabapentin  200 mg Per Tube BID  . insulin aspart  0-15 Units Subcutaneous Q4H  . ipratropium-albuterol  3 mL Nebulization Q6H  . levothyroxine  150 mcg Per Tube Daily  . mouth rinse  15 mL Mouth Rinse QID  . pantoprazole sodium  40 mg Per Tube Q1200  . Valproate Sodium  250 mg Per Tube Q8H  . venlafaxine  37.5 mg Per Tube Daily    Continuous Infusions: . ceFEPime (MAXIPIME) IV Stopped (01/17/2017 2050)  . feeding supplement (VITAL HIGH PROTEIN) 1,000 mL (01/21/17 0600)  . fentaNYL infusion INTRAVENOUS 220 mcg/hr (01/21/17 0825)  . norepinephrine (LEVOPHED) Adult infusion 4 mcg/min (01/21/17 0828)  . vancomycin 1,250 mg (02/05/2017 1558)    PRN Meds: acetaminophen **OR** acetaminophen, fentaNYL, ondansetron **OR** ondansetron (ZOFRAN) IV, senna-docusate, sodium chloride flush  Physical Exam  Constitutional: She appears well-developed and well-nourished.  Cardiovascular:  tachycardic  Pulmonary/Chest:  Intubated with full support  Abdominal: Soft.  Musculoskeletal:  Myoclonus noted in L upper and L lower extremity and R face  Neurological:  Opens eyes briefly to voice, does not follow commands  Skin:  Scattered bruising  Psychiatric:  UTA, sedated  Nursing note and vitals reviewed.           Vital Signs: BP (!) 91/58   Pulse (!) 105   Temp (!) 101.1 F (38.4 C)   Resp 14   SpO2 96%  SpO2: SpO2: 96 % O2 Device: O2 Device: Ventilator O2 Flow Rate: O2 Flow Rate (L/min): 60 L/min  Intake/output summary:  Intake/Output Summary (Last 24 hours) at  01/21/17 1028 Last data filed at 01/21/17 0800  Gross per 24 hour  Intake          1041.99 ml  Output              775 ml  Net           266.99 ml   LBM: Last BM Date: 01/25/2017 Baseline Weight:   Most recent weight:         Palliative Assessment/Data: PPS: 10%    Flowsheet Rows     Most Recent Value  Intake Tab  Referral Department  Critical care  Unit at Time of Referral  ICU  Palliative Care Primary Diagnosis  Cardiac  Date Notified  02/10/2017  Palliative Care Type  New Palliative care  Reason for referral  Clarify Goals of Care  Date of Admission  01/21/2017  Date first seen by Palliative Care  01/19/2017  # of days Palliative referral response time  0 Day(s)  # of days IP prior to Palliative referral  0  Clinical Assessment  Psychosocial & Spiritual Assessment  Palliative Care Outcomes      Patient Active Problem List   Diagnosis Date Noted  . Sepsis ([place]) 01/23/2017  . Acute respiratory failure with hypoxia ([place])   . Advance care planning   . Goals of care, counseling/discussion   . Palliative care by specialist   . Acute on chronic respiratory failure ([place]) 01/15/2017  . HTN (hypertension) 08/11/2016  . Pressure injury of skin 07/21/2016  . Closed left hip fracture ([place]) 06/17/2016  . Acute on chronic respiratory failure with hypoxia ([place]) 05/18/2016  . Pneumonia 07/18/2015  . Paroxysmal atrial fibrillation ([place]) 05/28/2015  . 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 COPD, severe ([place]) 05/14/2015  . Benign hypertensive renal disease 05/14/2015  . Anxiety disorder 05/14/2015  . Diverticulosis 05/14/2015  . Abnormality of gait 05/14/2015  . Barrett's esophagus 05/14/2015  . Drug addiction in remission ([place]) 05/14/2015  . Mitral regurgitation 05/14/2015  . LBBB (left bundle branch block) 05/14/2015  .  Carotid bruit 05/14/2015  . Ischemic colitis ([place]) 05/14/2015  . History of peptic ulcer disease 05/14/2015  . Chronic diastolic CHF  (congestive heart failure) ([place]) 05/14/2015  . Atherosclerosis of aorta ([place]) 05/14/2015  . Involutional osteoporosis 05/14/2015  . Essential thrombocythemia ([place]) 05/14/2015  . H/O recurrent pneumonia 05/14/2015  . Heart & renal disease, hypertensive, with heart failure ([place]) 05/14/2015  . Adult hypothyroidism 05/14/2015  . Peripheral vascular disease ([place]) 05/14/2015  . Vascular dementia with behavioral disturbance 05/14/2015  . Vascular disorder of intestine ([place]) 05/14/2015  . Iron deficiency anemia 11/24/2014  . Alveolar hypoventilation 06/05/2014  . Arthritis, degenerative 02/20/2014    Palliative Care Assessment & Plan   Patient Profile: 83 y.o. female  with past medical history of chronic anemia, a fib, anxiety, vascular dementia, diabetes, HTN, CHF, COPD, hypothyroidism, PVD admitted on 02/12/2017 with increasing SOB and respiratory distress requiring intubation. She is requiring IV pressor titration for hemostasis. She was found to be hypoxic and hypercapnic with leucocytosis and elevated lactic acid, hypotensive and hypothermic. She is sedated on fentanyl. Notably, she was discharged home on 7/3 from hospitalization for MSSA bacteremia and pneumonia with PICC placed for ongoing IV antibiotics. SNF was recommended, but was declined and she was discharged home with 24 hr caregiver as alternative. Palliative medicine consulted for [place].   Assessment/Recommendations/Plan   Continue current level of care- do not escalate- please communicate realistically with son re: patient's progress- patient's son does not want to prolong patient's life if she is not going to improve to her baseline  [name] invested in continuing care "for a few days" but is realistic that patient may not survive hospitalization, open to comfort measures at extubation  Lorazepam 0.95m q4hr prn myoclonus  PMT will continue to support family holistically and aid in [place]ision making  Goals of Care and  Additional Recommendations:  Limitations on Scope of Treatment: No Tracheostomy  Code Status:  DNR  Prognosis:   Unable to determine , but I do not anticipate she will survive this hospitalization  Discharge Planning:  To Be Determined  Care plan was discussed with Dr. SAlva Garnet patient's son, ABilley Gosling and patient's RN, Myra.  Thank you for allowing the Palliative Medicine Team to assist in the care of this patient.   Total time: 25 minutes  Greater than 50%  of this time was spent counseling and coordinating care related to the above assessment and plan.  KMariana Kaufman AGNP-C Palliative Medicine   Please contact Palliative Medicine Team phone at [phone] questions and concerns.

## 2017-01-21 NOTE — Clinical Social Work Note (Signed)
CSW received consult for nursing home placement on admission. Patient is being considered for home with hospice or LTAC at this time. Please re consult CSW if CSW needs arise. Shela Leff MSW,LCSW [phone]

## 2017-01-21 NOTE — Progress Notes (Signed)
PULMONARY / CRITICAL CARE MEDICINE   Name: Theresa Vasquez MRN: [id number] DOB: [birth year]    ADMISSION DATE:  01/28/2017 CONSULTATION DATE: 007/28/2018  PT PROFILE: 83 y.o. F chronically debilitated with multiple medical problems hospitalized 06/29-07/03 with MSSA pneumonia and bacteremia. Was home less than 12 hours and returned to emergency department with respiratory distress, intubated. CXR consistent with pulmonary edema.  MAJOR EVENTS/TEST RESULTS: 07/04 Admitted as above with working diagnoses of recent MSSA PNA, CAF, pulmonary edema, acute bronchospasm, hypotension, AKI, lactic acidosis, advanced dementia, chronic debilitation   INDWELLING DEVICES:: ETT 07/04 >>  RUMI PICC 07/03 >>   MICRO DATA: Urine 07/04 >> NEG Resp 07/04 >>   ANTIMICROBIALS:  Vanc 07/04 >>  Cefepime 07/04 >>    SUBJECTIVE:  Intubated, sedated, RASS -4, not following commands  VITAL SIGNS: BP 106/87   Pulse (!) 109   Temp (!) 100.8 F (38.2 C)   Resp 14   SpO2 (!) 89%   HEMODYNAMICS:    VENTILATOR SETTINGS: Vent Mode: PRVC FiO2 (%):  [40 %-60 %] 45 % Set Rate:  [14 bmp] 14 bmp Vt Set:  [500 mL] 500 mL PEEP:  [5 cmH20] 5 cmH20 Plateau Pressure:  [19 cmH20-22 cmH20] 22 cmH20  INTAKE / OUTPUT: I/O last 3 completed shifts: In: 931.8 [I.V.:491.8; NG/GT:440] Out: 675 [Urine:675]  PHYSICAL EXAMINATION: General: Intubated, sedated, RASS -4, not F/C  Neuro: CNs intact, MAEs HEENT: NCAT, sclerae white Cardiovascular: IRIR, no murmurs Lungs: Coarse throughout with distant scattered wheezes Abdomen: Obese, hypoactive BS, nontender Extremities: Multiple ecchymoses on all extremities, symmetric ankle and pedal edema  LABS:  BMET  Recent Labs Lab 01/18/17 0402 02/14/2017 0700 01/21/17 0540  NA 138 138 138  K 3.6 4.1 4.2  CL 100* 97* 100*  CO2 31 29 31   BUN 12 14 25*  CREATININE 0.67 1.00 1.14*  GLUCOSE 176* 377* 200*    Electrolytes  Recent Labs Lab 01/17/17 2157  01/18/17 0402 02/04/2017 0700 01/21/17 0540  CALCIUM 7.8* 7.7* 8.1* 7.9*  MG 1.9  --   --   --   PHOS 3.3  --   --   --     CBC  Recent Labs Lab 01/18/17 0402 01/19/17 1334 02/01/2017 0700 01/21/17 0540  WBC 7.2  --  19.3* 9.8  HGB 7.3* 8.0* 8.6* 7.8*  HCT 22.6*  --  28.8* 25.1*  PLT 324  --  652* 587*    Coag's No results for input(s): APTT, INR in the last 168 hours.  Sepsis Markers  Recent Labs Lab 01/15/17 1650 02/01/2017 0700 02/06/2017 0732 02/10/2017 0950 01/21/17 0540  LATICACIDVEN 2.6*  --  5.0* 1.3  --   PROCALCITON  --  0.13  --   --  0.62    ABG  Recent Labs Lab 01/16/17 0830 01/17/17 0915 02/08/2017 0812  PHART 7.43 7.42 7.31*  PCO2ART 46 48 58*  PO2ART 70* 91 82*    Liver Enzymes  Recent Labs Lab 01/15/17 1339 01/21/17 0540  AST 45* 27  ALT 17 12*  ALKPHOS 48 41  BILITOT 0.7 0.5  ALBUMIN 3.1* 2.5*    Cardiac Enzymes  Recent Labs Lab 01/31/2017 0700 02/09/2017 1300 01/21/17 1416  TROPONINI <0.03 <0.03 <0.03    Glucose  Recent Labs Lab 01/18/2017 1546 02/14/2017 1931 01/20/17 2339 01/21/17 0351 01/21/17 0741 01/21/17 1227  GLUCAP 154* 124* 167* 198* 200* 173*    CXR: CM, interstitial edema pattern, LLL ATX   ASSESSMENT / PLAN:  PULMONARY A:  Acute on chronic hypoxic/hypercapnic respiratory failure  AE COPD Pulmonary edema Recent MSSA pneumonia   Ventilator dependence  Hx:  P:   Cont full vent support - settings reviewed and/or adjusted Cont vent bundle Daily SBT if/when meets criteria  Nebulized steroids and bronchodilators Lasix 107/05  CARDIOVASCULAR A:  Chronic Atrial Fibrillation  Hypotension Hx: PVD, LBBB, HTN, Atherosclerosis, aortic stenosis, CHFpEF P:  Continue vasopressors to maintain M AP > 65 mmHg   RENAL A:   Mild AKI, nonoliguric P:   Monitor BMET intermittently Monitor I/Os Correct electrolytes as indicated  GASTROINTESTINAL A:   No acute issues  P:   SUP: Enteral PPI Continue TF  protocol  HEMATOLOGIC A:   Anemia without acute blood loss  P:  DVT px: Enoxaparin Monitor CBC intermittently Transfuse per usual guidelines   INFECTIOUS A:   Recent MSSA pneumonia Possible HCAP Minimally elevated PCT Leukocytosis, resolved P:   Monitor temp, WBC count Micro and abx as above  Follow PCT - consider narrowing antibiotics 07/06  ENDOCRINE A:   Type II DM  Hypothyroidism P:   Continue SSI Continue levothyroxine  NEUROLOGIC A:   Acute encephalopathy - TME Advanced dementia ICU/ventilator associated discomfort P:   RASS goal: -1, -2 PAD protocol - fentanyl infusion WUA daily    FAMILY: Son and caregiver updated at bedside  CCM time: 45 mins  The above time includes time spent in consultation with patient and/or family members and reviewing care plan on multidisciplinary rounds  Merton Border, MD PCCM service Mobile [phone] Pager [phone] 01/21/2017 3:30 PM

## 2017-01-21 NOTE — Progress Notes (Addendum)
Pharmacy Antibiotic Note  Theresa Vasquez is a 83 y.o. female recently discharged on 7/3 with IV cefazolin for MRSA bacteremia and PNA  admitted on 02/07/2017 with sepsis.  Pharmacy has been consulted for vancomycin and cefepime dosing.  Plan: Vancomycin 1250 mg iv q 24 hours ordered.  Will check vancomycin level prior to the dose this PM before proceeding with dose administration due to mild AKI.   Trough is OK at 12 mcg/ml and not representing steady-state. Will continue with vancomycin 1250 mg iv q 24 hours and check a steady-state trough with the 4th dsoe.   Cefepime 2 g iv once then 1 g iv q 12 hours.      Temp (24hrs), Avg:100.2 F (37.9 C), Min:96.6 F (35.9 C), Max:101.8 F (38.8 C)   Recent Labs Lab 01/15/17 1346 01/15/17 1650 01/16/17 0333 01/17/17 0531 01/17/17 2157 01/18/17 0402 02/05/2017 0700 01/19/2017 0732 01/26/2017 0950 01/21/17 0540  WBC  --   --  10.0 8.7  --  7.2 19.3*  --   --  9.8  CREATININE  --   --  0.70 0.72 0.63 0.67 1.00  --   --  1.14*  LATICACIDVEN 2.4* 2.6*  --   --   --   --   --  5.0* 1.3  --     Estimated Creatinine Clearance: 42.5 mL/min (A) (by C-G formula based on SCr of 1.14 mg/dL (H)).    No Known Allergies  Antimicrobials this admission: cefepime 7/4 >>  vancomycin 7/4 >>   Dose adjustments this admission:   Microbiology results: UCx: sent  Thank you for allowing pharmacy to be a part of this patient's care.  Ulice Dash D 01/21/2017 12:21 PM

## 2017-01-22 ENCOUNTER — Inpatient Hospital Stay: Payer: Medicare Other

## 2017-01-22 DIAGNOSIS — J9602 Acute respiratory failure with hypercapnia: Secondary | ICD-10-CM

## 2017-01-22 LAB — GLUCOSE, CAPILLARY
GLUCOSE-CAPILLARY: 138 mg/dL — AB (ref 65–99)
GLUCOSE-CAPILLARY: 177 mg/dL — AB (ref 65–99)
GLUCOSE-CAPILLARY: 180 mg/dL — AB (ref 65–99)
GLUCOSE-CAPILLARY: 192 mg/dL — AB (ref 65–99)
GLUCOSE-CAPILLARY: 192 mg/dL — AB (ref 65–99)
Glucose-Capillary: 198 mg/dL — ABNORMAL HIGH (ref 65–99)

## 2017-01-22 LAB — BLOOD GAS, ARTERIAL
Acid-Base Excess: 9.1 mmol/L — ABNORMAL HIGH (ref 0.0–2.0)
BICARBONATE: 36.1 mmol/L — AB (ref 20.0–28.0)
FIO2: 0.75
LHR: 14 {breaths}/min
MECHANICAL RATE: 14
MECHVT: 500 mL
O2 Saturation: 93.3 %
PATIENT TEMPERATURE: 37
PCO2 ART: 67 mmHg — AB (ref 32.0–48.0)
PEEP: 7 cmH2O
PO2 ART: 72 mmHg — AB (ref 83.0–108.0)
pH, Arterial: 7.34 — ABNORMAL LOW (ref 7.350–7.450)

## 2017-01-22 LAB — CBC
HEMATOCRIT: 23.3 % — AB (ref 35.0–47.0)
HEMOGLOBIN: 7.2 g/dL — AB (ref 12.0–16.0)
MCH: 28.8 pg (ref 26.0–34.0)
MCHC: 30.9 g/dL — ABNORMAL LOW (ref 32.0–36.0)
MCV: 93.1 fL (ref 80.0–100.0)
Platelets: 470 10*3/uL — ABNORMAL HIGH (ref 150–440)
RBC: 2.5 MIL/uL — AB (ref 3.80–5.20)
RDW: 15.7 % — ABNORMAL HIGH (ref 11.5–14.5)
WBC: 8 10*3/uL (ref 3.6–11.0)

## 2017-01-22 LAB — BASIC METABOLIC PANEL
Anion gap: 9 (ref 5–15)
BUN: 35 mg/dL — ABNORMAL HIGH (ref 6–20)
CHLORIDE: 99 mmol/L — AB (ref 101–111)
CO2: 30 mmol/L (ref 22–32)
Calcium: 8.1 mg/dL — ABNORMAL LOW (ref 8.9–10.3)
Creatinine, Ser: 0.99 mg/dL (ref 0.44–1.00)
GFR calc non Af Amer: 51 mL/min — ABNORMAL LOW (ref >60)
GFR, EST AFRICAN AMERICAN: 59 mL/min — AB (ref >60)
Glucose, Bld: 177 mg/dL — ABNORMAL HIGH (ref 65–99)
POTASSIUM: 4.2 mmol/L (ref 3.5–5.1)
SODIUM: 138 mmol/L (ref 135–145)

## 2017-01-22 LAB — TROPONIN I

## 2017-01-22 LAB — PROCALCITONIN: PROCALCITONIN: 0.47 ng/mL

## 2017-01-22 LAB — PREPARE RBC (CROSSMATCH)

## 2017-01-22 MED ORDER — DIPHENHYDRAMINE HCL 50 MG/ML IJ SOLN
12.5000 mg | Freq: Once | INTRAMUSCULAR | Status: AC
  Administered 2017-01-22: 12.5 mg via INTRAVENOUS
  Filled 2017-01-22: qty 1

## 2017-01-22 MED ORDER — FUROSEMIDE 10 MG/ML IJ SOLN: 20.0000 mg | Freq: Once | INTRAMUSCULAR | Status: DC

## 2017-01-22 MED ORDER — FUROSEMIDE 10 MG/ML IJ SOLN
40.0000 mg | Freq: Once | INTRAMUSCULAR | Status: AC
  Administered 2017-01-22: 40 mg via INTRAVENOUS
  Filled 2017-01-22: qty 4

## 2017-01-22 MED ORDER — SODIUM CHLORIDE 0.9 % IV SOLN
Freq: Once | INTRAVENOUS | Status: AC
  Administered 2017-01-22: 18:00:00 via INTRAVENOUS

## 2017-01-22 MED ORDER — SODIUM CHLORIDE 0.9 % IN NEBU: 3.0000 mL | INHALATION_SOLUTION | RESPIRATORY_TRACT | Status: DC

## 2017-01-22 MED ORDER — ACETAMINOPHEN 650 MG RE SUPP
650.0000 mg | Freq: Once | RECTAL | Status: AC
  Administered 2017-01-22: 650 mg via RECTAL
  Filled 2017-01-22: qty 1

## 2017-01-22 MED ORDER — ACETYLCYSTEINE 10 % IN SOLN
4.0000 mL | RESPIRATORY_TRACT | Status: DC
  Filled 2017-01-22 (×2): qty 4

## 2017-01-22 MED ORDER — ACETYLCYSTEINE 20 % IN SOLN
4.0000 mL | RESPIRATORY_TRACT | Status: DC
  Administered 2017-01-22: 4 mL via RESPIRATORY_TRACT
  Administered 2017-01-23: 1 mL via RESPIRATORY_TRACT
  Filled 2017-01-22 (×3): qty 4

## 2017-01-22 NOTE — Progress Notes (Signed)
Pharmacy Antibiotic Note  Theresa Vasquez is a 83 y.o. female recently discharged on 7/3 with IV cefazolin for MRSA bacteremia and PNA  admitted on 02/07/2017 with sepsis.  Pharmacy has been consulted for vancomycin and cefepime dosing.  Plan: Vancomycin 1250 mg iv q 24 hours ordered.  Will check a trough with the 4th dose 7/7 at 1430.  Will continue cefepime 1 g iv q 12 hours.   Height: 5\' 7"  (170.2 cm) Weight: 208 lb 8.9 oz (94.6 kg) IBW/kg (Calculated) : 61.6  Temp (24hrs), Avg:100.6 F (38.1 C), Min:99.4 F (37.4 C), Max:102 F (38.9 C)   Recent Labs Lab 01/15/17 1346 01/15/17 1650  01/17/17 0531 01/17/17 2157 01/18/17 0402 02/08/2017 0700 02/15/2017 0732 01/23/2017 0950 01/21/17 0540 01/21/17 1416 01/22/17 0520  WBC  --   --   < > 8.7  --  7.2 19.3*  --   --  9.8  --  8.0  CREATININE  --   --   < > 0.72 0.63 0.67 1.00  --   --  1.14*  --  0.99  LATICACIDVEN 2.4* 2.6*  --   --   --   --   --  5.0* 1.3  --   --   --   VANCOTROUGH  --   --   --   --   --   --   --   --   --   --  12*  --   < > = values in this interval not displayed.  Estimated Creatinine Clearance: 50 mL/min (by C-G formula based on SCr of 0.99 mg/dL).    No Known Allergies  Antimicrobials this admission: cefepime 7/4 >>  vancomycin 7/4 >>   Dose adjustments this admission:  Microbiology results: UCx: NG TA: pending  Thank you for allowing pharmacy to be a part of this patient's care.  Ulice Dash D 01/22/2017 12:56 PM

## 2017-01-22 NOTE — Progress Notes (Signed)
Remains intubated with tube feeds. FiO2 increased to 55% for  O2 saturations of 84-88%. Right leg bleeding decreased- dressing changed at 1800. Left hand edema +3 even after  elevation all shift-NP aware. Also notified NP D. Blakney of temp of 101.2. Pre medication for blood ordered due to temperature.

## 2017-01-22 NOTE — Progress Notes (Signed)
PULMONARY / CRITICAL CARE MEDICINE   Name: Theresa Vasquez MRN: [id number] DOB: [birth year]    ADMISSION DATE:  01/23/2017 CONSULTATION DATE: 007/06/2017  PT PROFILE: 83 y.o. F chronically debilitated with multiple medical problems hospitalized 06/29-07/03 with MSSA pneumonia and bacteremia. Was home less than 12 hours and returned to emergency department with respiratory distress, intubated. CXR consistent with pulmonary edema.  MAJOR EVENTS/TEST RESULTS: 07/04 Admitted as above with working diagnoses of recent MSSA PNA, CAF, pulmonary edema, acute bronchospasm, hypotension, AKI, lactic acidosis, advanced dementia, chronic debilitation   INDWELLING DEVICES:: ETT 07/04 >>  RUMI PICC 07/03 >>   MICRO DATA: Urine 07/04 >> NEG Resp 07/04 >>   ANTIMICROBIALS:  Vanc 07/04 >>  Cefepime 07/04 >>    SUBJECTIVE:  Intubated, sedated, RASS -4, not following commands  VITAL SIGNS: BP 105/79   Pulse 100   Temp 99.7 F (37.6 C)   Resp 14   Ht 5\' 7"  (1.702 m)   Wt 94.6 kg (208 lb 8.9 oz)   SpO2 93%   BMI 32.66 kg/m   HEMODYNAMICS:    VENTILATOR SETTINGS: Vent Mode: PRVC FiO2 (%):  [45 %-50 %] 50 % Set Rate:  [1 bmp-14 bmp] 14 bmp Vt Set:  [500 mL] 500 mL PEEP:  [5 cmH20] 5 cmH20 Plateau Pressure:  [18 cmH20] 18 cmH20  INTAKE / OUTPUT: I/O last 3 completed shifts: In: 2138.3 [I.V.:698.3; NG/GT:1140; IV Piggyback:300] Out: 1545 [Urine:1545]  PHYSICAL EXAMINATION: General: Intubated, sedated, RASS -4, not F/C  Neuro: CNs intact, MAEs HEENT: NCAT, sclerae white Cardiovascular: IRIR, no murmurs Lungs: Coarse throughout with distant scattered wheezes Abdomen: Obese, hypoactive BS, nontender Extremities: Multiple ecchymoses on all extremities, symmetric ankle and pedal edema  LABS:  BMET  Recent Labs Lab 02/11/2017 0700 01/21/17 0540 01/22/17 0520  NA 138 138 138  K 4.1 4.2 4.2  CL 97* 100* 99*  CO2 29 31 30   BUN 14 25* 35*  CREATININE 1.00 1.14* 0.99  GLUCOSE  377* 200* 177*    Electrolytes  Recent Labs Lab 01/17/17 2157  01/25/2017 0700 01/21/17 0540 01/22/17 0520  CALCIUM 7.8*  < > 8.1* 7.9* 8.1*  MG 1.9  --   --   --   --   PHOS 3.3  --   --   --   --   < > = values in this interval not displayed.  CBC  Recent Labs Lab 01/26/2017 0700 01/21/17 0540 01/22/17 0520  WBC 19.3* 9.8 8.0  HGB 8.6* 7.8* 7.2*  HCT 28.8* 25.1* 23.3*  PLT 652* 587* 470*    Coag's No results for input(s): APTT, INR in the last 168 hours.  Sepsis Markers  Recent Labs Lab 01/15/17 1650 02/01/2017 0700 01/22/2017 0732 01/22/2017 0950 01/21/17 0540 01/22/17 0520  LATICACIDVEN 2.6*  --  5.0* 1.3  --   --   PROCALCITON  --  0.13  --   --  0.62 0.47    ABG  Recent Labs Lab 01/16/17 0830 01/17/17 0915 01/17/2017 0812  PHART 7.43 7.42 7.31*  PCO2ART 46 48 58*  PO2ART 70* 91 82*    Liver Enzymes  Recent Labs Lab 01/15/17 1339 01/21/17 0540  AST 45* 27  ALT 17 12*  ALKPHOS 48 41  BILITOT 0.7 0.5  ALBUMIN 3.1* 2.5*    Cardiac Enzymes  Recent Labs Lab 01/20/17 1300 01/21/17 1416 01/22/17 0520  TROPONINI <0.03 <0.03 <0.03    Glucose  Recent Labs Lab 01/21/17 1227 01/21/17 1547 01/21/17 1944  01/21/17 2348 01/22/17 0403 01/22/17 0729  GLUCAP 173* 180* 196* 188* 180* 138*    CXR: CM, interstitial edema pattern, LLL ATX   ASSESSMENT / PLAN:  PULMONARY A: Acute on chronic hypoxic/hypercapnic respiratory failure  AE COPD Pulmonary edema Recent MSSA pneumonia   Ventilator dependence  Hx:  P:   Cont full vent support - settings reviewed and/or adjusted Cont vent bundle Daily SBT if/when meets criteria  Nebulized steroids and bronchodilators Lasix 107/05  CARDIOVASCULAR A:  Chronic Atrial Fibrillation  Hypotension Hx: PVD, LBBB, HTN, Atherosclerosis, aortic stenosis, CHFpEF P:  Continue vasopressors to maintain M AP > 65 mmHg   RENAL A:   Mild AKI, nonoliguric P:   Monitor BMET intermittently Monitor  I/Os Correct electrolytes as indicated  GASTROINTESTINAL A:   No acute issues  P:   SUP: Enteral PPI Continue TF protocol  HEMATOLOGIC A:   Anemia without acute blood loss  P:  DVT px: Enoxaparin Monitor CBC intermittently Transfuse per usual guidelines   INFECTIOUS A:   Recent MSSA pneumonia Possible HCAP Minimally elevated PCT Leukocytosis, resolved P:   Monitor temp, WBC count Micro and abx as above  Follow PCT - consider narrowing antibiotics 07/06  ENDOCRINE A:   Type II DM  Hypothyroidism P:   Continue SSI Continue levothyroxine  NEUROLOGIC A:   Acute encephalopathy - TME Advanced dementia ICU/ventilator associated discomfort P:   RASS goal: -1, -2 PAD protocol - fentanyl infusion WUA daily    FAMILY: Son and caregiver updated at bedside  CCM time: 35 mins  The above time includes time spent in consultation with patient and/or family members and reviewing care plan on multidisciplinary rounds  Hermelinda Dellen, DO PCCM service Mobile [phone] Pager [phone] 01/22/2017 9:13 AM

## 2017-01-22 NOTE — Progress Notes (Signed)
Late note 0800- Right skin tear ( present on admission) oozing bright red colored fluid.  Dressing applied to Mepilex dressing already put on area 7/4. 1600 Oozing  right skin tear increased greatly. D.Blakney NP and Dr. Jefferson Fuel aware.  Area redressed. Blood platelet level unchanged but hemoglobin down from 7.8 to 7.2. 1 unit of rbcs ordered to infuse. 1550 type and screen sent to lab. Also noted left hand edema increased greatly since 0800 from +1 to +3.

## 2017-01-22 NOTE — Progress Notes (Signed)
[facility] [place] NAME: Theresa Vasquez    MR#:  [id number]  DATE OF BIRTH:  1930/10/05  SUBJECTIVE:   Patient here due to acute respiratory failure, sepsis with septic shock. Remains critically ill and intubated but off vasopressors. Myoclonic Jerking has improved.  Still having some fever spikes but improved.    REVIEW OF SYSTEMS:    Review of Systems  Unable to perform ROS: Intubated    Nutrition: Tube feeds Tolerating Diet: yes Tolerating PT:  Await Eval.   DRUG ALLERGIES:  No Known Allergies  VITALS:  Blood pressure (!) 108/58, pulse (!) 126, temperature 99.4 F (37.4 C), resp. rate 15, height 5' 7"  (1.702 m), weight 94.6 kg (208 lb 8.9 oz), SpO2 92 %.  PHYSICAL EXAMINATION:   Physical Exam  GENERAL:  83 y.o.-year-old patient lying in bed sedated & intubated.  EYES: Pupils equal, round, reactive to light. No scleral icterus.   HEENT: Head atraumatic, normocephalic. ET and OG tube in place.  NECK:  Supple, no jugular venous distention. No thyroid enlargement, no tenderness.  LUNGS: Normal breath sounds bilaterally, no wheezing, rales, rhonchi. No use of accessory muscles of respiration.  CARDIOVASCULAR: S1, S2 normal. No murmurs, rubs, or gallops.  ABDOMEN: Soft, nontender, nondistended. Bowel sounds present. No organomegaly or mass.  EXTREMITIES: No cyanosis, clubbing or edema b/l.   Signs of chronic venous stasis bilaterally. NEUROLOGIC: Sedated and intubated  PSYCHIATRIC: Sedated and intubated SKIN: No obvious rash, lesion, or ulcer. Skin tears on Right Leg and left elbow.    LABORATORY PANEL:   CBC  Recent Labs Lab 01/22/17 0520  WBC 8.0  HGB 7.2*  HCT 23.3*  PLT 470*   ------------------------------------------------------------------------------------------------------------------  Chemistries   Recent Labs Lab 01/17/17 2157  01/21/17 0540 01/22/17 0520  NA 136  < > 138 138  K 3.8  < > 4.2 4.2   CL 98*  < > 100* 99*  CO2 31  < > 31 30  GLUCOSE 173*  < > 200* 177*  BUN 13  < > 25* 35*  CREATININE 0.63  < > 1.14* 0.99  CALCIUM 7.8*  < > 7.9* 8.1*  MG 1.9  --   --   --   AST  --   --  27  --   ALT  --   --  12*  --   ALKPHOS  --   --  41  --   BILITOT  --   --  0.5  --   < > = values in this interval not displayed. ------------------------------------------------------------------------------------------------------------------  Cardiac Enzymes  Recent Labs Lab 01/22/17 0520  TROPONINI <0.03   ------------------------------------------------------------------------------------------------------------------  RADIOLOGY:  Dg Chest Port 1 View  Result Date: 01/22/2017 CLINICAL DATA:  Respiratory failure EXAM: PORTABLE CHEST 1 VIEW COMPARISON:  January 21, 2017 FINDINGS: Endogastric tube is identified in good position and distal tip 3.7 cm from carina. Nasogastric tube is identified distal tip not included on the film but is at least in the stomach. Right jugular central venous line identified distal tip in the superior vena cava. The heart size and mediastinal contours are stable. The heart size is enlarged. There is pulmonary edema. IMPRESSION: Congestive heart failure. Electronically Signed   By: Abelardo Diesel M.D.   On: 01/22/2017 07:12   Dg Chest Port 1 View  Result Date: 01/21/2017 CLINICAL DATA:  Respiratory failure. EXAM: PORTABLE CHEST 1 VIEW COMPARISON:  01/17/2017. FINDINGS: Endotracheal tube noted with its  tip at the carina. Proximal repositioning of approximately 2-3 cm suggested. NG tube noted with tip below left hemidiaphragm. Right PICC line noted with tip at cavoatrial junction. Cardiomegaly with improvement of bilateral upper lobe infiltrates/edema. Progressive bilateral lower lobe infiltrates/ edema noted. Basilar atelectasis. Small bilateral pleural effusions. IMPRESSION: 1. Endotracheal tube tip noted at the level of the carina. Proximal repositioning of approximately  2-3 cm suggested. NG tube and right PICC line stable position. 2. Cardiomegaly with improvement of bilateral upper lobe infiltrates/edema. Progressive bilateral lower lobe infiltrates/edema noted. Small bilateral pleural effusions. Critical Value/emergent results were called by telephone at the time of interpretation on 01/21/2017 at 6:26 am to nurse Tess,who verbally acknowledged these results. Electronically Signed   By: Marcello Moores  Register   On: 01/21/2017 06:28     ASSESSMENT AND PLAN:   83 year old female with a known history of Atrial fibrillation, anxiety, dementia, depression, diabetes, hypertension, history of diastolic CHF, COPD, hypothyroidism, peripheral vascular disease presented to the hospital due to shortness of breath and respiratory distress.  1. Sepsis-patient met criteria given her hypothermia, elevated lactic acid, leukocytosis. -Patient's pro calcitonin level although was normal. Source thought to be pulmonary and continues to have fever spikes but improved. -She was just recently discharged from the hospital on IV Ancef for MSSA bacteremia from suspected MSSA pneumonia. Given her acute illness and her hypotension continue IV vancomycin, cefepime. - off Vasopressors now Follow cultures which are (-) so far  2. Acute respiratory failure with hypoxia-secondary to suspected pneumonia and also underlying pulmonary edema. -currently intubated sedated. Currently on 50% FiO2 with 5 of PEEP. Follow serial ABGs, further care as per pulmonary. -Continue IV antibiotics for pneumonia as mentioned above.    3. CHF-acute on chronic diastolic dysfunction. Given one dose of Lasix this a.m. And will follow response.  -Her recent echocardiogram in June which showed normal ejection fraction of 55-60%.   4. Acute kidney injury-secondary to sepsis, septic shock. It improved and creatinine back to baseline with fluids and vasopressors. -Follow BUN/creatinine urine output  5. Myoclonic  jerks-secondary to patient's high dose of gabapentin and acute kidney injury. Much improved since yesterday. Gabapentin dose has been tapered.  6. History of dementia-continue Aricept, Depakote.  7. Chronic atrial fibrillation-currently rates labile. Continue amiodarone. -Patient is not on long-term anticoagulation due to high fall risk.  8. Hypothyroidism-continue Synthroid.  9. COPD-no acute exacerbation.  10. GERD-continue Protonix.  11. Neuropathy-continue gabapentin. Lower dose due to Myoclonic Jerking   12. Essential hypertension-BP on low side and anti-HTN on hold. .   All the records are reviewed and case discussed with Care Management/Social Worker. Management plans discussed with the patient, family and they are in agreement.  CODE STATUS: DNR  DVT Prophylaxis: Lovenox  TOTAL TIME TAKING CARE OF THIS PATIENT: 30 minutes.   POSSIBLE D/C IN 3-4 DAYS, DEPENDING ON CLINICAL CONDITION.   Henreitta Leber M.D on 01/22/2017 at 2:10 PM  Between 7am to 6pm - Pager - [phone]  After 6pm go to [email or web address] - [employer] [facility]  Office  [phone]  CC: Primary care physician; Valerie Roys, DO

## 2017-01-22 NOTE — Progress Notes (Signed)
Nutrition Follow-up  DOCUMENTATION CODES:   Obesity unspecified  INTERVENTION:  Continue Vital High Protein @ 50, Pro-stat 20mL BID via OGT Free water 66mL every 6 hours 1400 calories, 135gm protein, 1303cc free water  NUTRITION DIAGNOSIS:   Inadequate oral intake related to acute illness (PNA, COPD, pt ventilated ) as evidenced by NPO status. -ongoing  GOAL:   Provide needs based on ASPEN/SCCM guidelines -meeting  MONITOR:   Diet advancement, Vent status, Labs, Weight trends, Skin, I & O's  ASSESSMENT:   83 yo female with a PMH of Vascular Disorder of Intestine, Vascular Dementia, Type II DM, Recurrent Pneumonia, PVD, PUD, Paroxysmal Atrial Fibrillation, Psychoactive Substance Abuse, Osteoarthritis, Mitral Valve Insufficiency, LBBB, Hypothyroidism, HTN, Hyperlipidemia, Gastrointestinal Hemorrhage, CKD, Diverticulosis, Depression, COPD, CHF, and Adenomatous Polyps. She presented to [facility] ER 07/4 via EMS unresponsive with respiratory distress.  Per ER notes EMS reported O2 sats upon arrival were in the 50's requiring bag mask ventilation with O2 sats improving to the 80's.  The pt was discharged from [facility] 07/3 with a PICC line in place for IV antibiotics due to a diagnosis of staph pneumonia.  At baseline she is bed bound and requires total care. Due to presenting symptoms pt required mechanical intubation and was admitted to the ICU by hospitalist team PCCM consulted for management.    Patient is currently intubated on ventilator support MV: 7.1 L/min Temp (24hrs), Avg:100.6 F (38.1 C), Min:99.4 F (37.4 C), Max:102 F (38.9 C) Propofol: none Discussed in Rounds --> PNA, Pulmonary Edema, CHF Ejection Fracture 55-60% Palliative saw yesterday - no escalation of care, son wants to continue care "for a few days" before making any decisions. Wife is at the beach, waiting on her to return. MAP:  97 Access: RUE PICC  Intake/Output Summary (Last 24 hours) at 01/22/17 1147 Last data  filed at 01/22/17 0900  Gross per 24 hour  Intake             1469 ml  Output             1155 ml  Net              314 ml   Labs and medications reviewed: Levo gtt, Fentanyl gtt, Protonix per tube, Iron sulfate  Diet Order:  Diet NPO time specified  Skin:  Wound (see comment) (Stage II foot)  Last BM:  6/28  Height:   Ht Readings from Last 1 Encounters:  01/21/17 5\' 7"  (1.702 m)    Weight:   Wt Readings from Last 1 Encounters:  01/21/17 208 lb 8.9 oz (94.6 kg)    Ideal Body Weight:  61.3 kg  BMI:  Body mass index is 32.66 kg/m.  Estimated Nutritional Needs:   Kcal:  9476-5465 calories  Protein:  123-135g/day   Fluid:  >1.3L/day   EDUCATION NEEDS:   Education needs no appropriate at this time  Theresa Vasquez. Theresa Podgorski, MS, RD LDN Inpatient Clinical Dietitian Pager [phone]

## 2017-01-23 ENCOUNTER — Inpatient Hospital Stay: Payer: Medicare Other

## 2017-01-23 LAB — CBC
HCT: 24.6 % — ABNORMAL LOW (ref 35.0–47.0)
HEMATOCRIT: 25.1 % — AB (ref 35.0–47.0)
HEMOGLOBIN: 8.2 g/dL — AB (ref 12.0–16.0)
Hemoglobin: 7.8 g/dL — ABNORMAL LOW (ref 12.0–16.0)
MCH: 29.6 pg (ref 26.0–34.0)
MCH: 28.4 pg (ref 26.0–34.0)
MCHC: 32.7 g/dL (ref 32.0–36.0)
MCHC: 31.9 g/dL — AB (ref 32.0–36.0)
MCV: 90.5 fL (ref 80.0–100.0)
MCV: 89.3 fL (ref 80.0–100.0)
PLATELETS: 577 10*3/uL — AB (ref 150–440)
PLATELETS: 538 10*3/uL — AB (ref 150–440)
RBC: 2.77 MIL/uL — AB (ref 3.80–5.20)
RBC: 2.75 MIL/uL — AB (ref 3.80–5.20)
RDW: 15.1 % — ABNORMAL HIGH (ref 11.5–14.5)
RDW: 15.5 % — ABNORMAL HIGH (ref 11.5–14.5)
WBC: 10.3 10*3/uL (ref 3.6–11.0)
WBC: 9.7 10*3/uL (ref 3.6–11.0)

## 2017-01-23 LAB — TYPE AND SCREEN
ABO/RH(D): O POS
Antibody Screen: NEGATIVE
Unit division: 0

## 2017-01-23 LAB — BASIC METABOLIC PANEL
Anion gap: 6 (ref 5–15)
BUN: 37 mg/dL — AB (ref 6–20)
CHLORIDE: 100 mmol/L — AB (ref 101–111)
CO2: 34 mmol/L — AB (ref 22–32)
Calcium: 8.1 mg/dL — ABNORMAL LOW (ref 8.9–10.3)
Creatinine, Ser: 0.97 mg/dL (ref 0.44–1.00)
GFR calc Af Amer: >60 mL/min (ref >60)
GFR calc non Af Amer: 52 mL/min — ABNORMAL LOW (ref >60)
GLUCOSE: 218 mg/dL — AB (ref 65–99)
POTASSIUM: 4.6 mmol/L (ref 3.5–5.1)
Sodium: 140 mmol/L (ref 135–145)

## 2017-01-23 LAB — APTT: APTT: 35 s (ref 24–36)

## 2017-01-23 LAB — CULTURE, RESPIRATORY: CULTURE: NO GROWTH

## 2017-01-23 LAB — GLUCOSE, CAPILLARY
GLUCOSE-CAPILLARY: 211 mg/dL — AB (ref 65–99)
GLUCOSE-CAPILLARY: 252 mg/dL — AB (ref 65–99)
GLUCOSE-CAPILLARY: 231 mg/dL — AB (ref 65–99)
GLUCOSE-CAPILLARY: 241 mg/dL — AB (ref 65–99)
Glucose-Capillary: 198 mg/dL — ABNORMAL HIGH (ref 65–99)

## 2017-01-23 LAB — VANCOMYCIN, TROUGH: Vancomycin Tr: 11 ug/mL — ABNORMAL LOW (ref 15–20)

## 2017-01-23 LAB — PHOSPHORUS: Phosphorus: 4 mg/dL (ref 2.5–4.6)

## 2017-01-23 LAB — PROTIME-INR
INR: 1.15
Prothrombin Time: 14.8 seconds (ref 11.4–15.2)

## 2017-01-23 LAB — CULTURE, RESPIRATORY W GRAM STAIN

## 2017-01-23 LAB — BPAM RBC
BLOOD PRODUCT EXPIRATION DATE: 201807152359
ISSUE DATE / TIME: 201807062117
Unit Type and Rh: 5100

## 2017-01-23 LAB — MAGNESIUM: Magnesium: 2.3 mg/dL (ref 1.7–2.4)

## 2017-01-23 MED ORDER — DEXTROSE 5 % IV SOLN
2.0000 g | Freq: Two times a day (BID) | INTRAVENOUS | Status: DC
  Administered 2017-01-23: 2 g via INTRAVENOUS
  Filled 2017-01-23 (×4): qty 2

## 2017-01-23 MED ORDER — HEPARIN (PORCINE) IN NACL 100-0.45 UNIT/ML-% IJ SOLN
1300.0000 [IU]/h | INTRAMUSCULAR | Status: DC
  Administered 2017-01-23: 1300 [IU]/h via INTRAVENOUS
  Filled 2017-01-23: qty 250

## 2017-01-23 MED ORDER — AMIODARONE HCL IN DEXTROSE 360-4.14 MG/200ML-% IV SOLN
30.0000 mg/h | INTRAVENOUS | Status: DC
  Administered 2017-01-23 – 2017-01-24 (×3): 30 mg/h via INTRAVENOUS
  Filled 2017-01-23 (×2): qty 200

## 2017-01-23 MED ORDER — CHLORHEXIDINE GLUCONATE 0.12 % MT SOLN
OROMUCOSAL | Status: AC
  Filled 2017-01-23: qty 15

## 2017-01-23 MED ORDER — AMIODARONE LOAD VIA INFUSION
150.0000 mg | Freq: Once | INTRAVENOUS | Status: AC
  Administered 2017-01-23: 150 mg via INTRAVENOUS

## 2017-01-23 MED ORDER — HEPARIN BOLUS VIA INFUSION
4000.0000 [IU] | Freq: Once | INTRAVENOUS | Status: AC
  Administered 2017-01-23: 4000 [IU] via INTRAVENOUS
  Filled 2017-01-23: qty 4000

## 2017-01-23 MED ORDER — VANCOMYCIN HCL 10 G IV SOLR
1250.0000 mg | INTRAVENOUS | Status: DC
  Filled 2017-01-23 (×3): qty 1250

## 2017-01-23 MED ORDER — AMIODARONE HCL IN DEXTROSE 360-4.14 MG/200ML-% IV SOLN
INTRAVENOUS | Status: AC
  Filled 2017-01-23: qty 200

## 2017-01-23 MED ORDER — IBUPROFEN 400 MG PO TABS
600.0000 mg | ORAL_TABLET | Freq: Once | ORAL | Status: AC
  Administered 2017-01-23: 600 mg
  Filled 2017-01-23: qty 2

## 2017-01-23 MED ORDER — AMIODARONE HCL IN DEXTROSE 360-4.14 MG/200ML-% IV SOLN
60.0000 mg/h | INTRAVENOUS | Status: AC
Start: 2017-01-23 — End: 2017-01-23
  Administered 2017-01-23 (×2): 60 mg/h via INTRAVENOUS
  Filled 2017-01-23: qty 200

## 2017-01-23 MED ORDER — ORAL CARE MOUTH RINSE
15.0000 mL | OROMUCOSAL | Status: DC
  Administered 2017-01-23 – 2017-01-24 (×6): 15 mL via OROMUCOSAL

## 2017-01-23 NOTE — Progress Notes (Signed)
Pharmacy Antibiotic Note  Theresa Vasquez is a 83 y.o. female recently discharged on 7/3 with IV cefazolin for MRSA bacteremia and PNA  admitted on 01/17/2017 with sepsis.  Pharmacy has been consulted for vancomycin and cefepime dosing.  Plan: Vancomycin 1250 mg iv q 24 hours ordered.  Will check a trough with the 4th dose 7/7 at 1430.  Will adjust cefepime to 2 g iv q 12 hours as patient still spiking fevers.     Height: 5\' 7"  (170.2 cm) Weight: 208 lb 8.9 oz (94.6 kg) IBW/kg (Calculated) : 61.6  Temp (24hrs), Avg:100.6 F (38.1 C), Min:100 F (37.8 C), Max:104.2 F (40.1 C)   Recent Labs Lab 01/18/17 0402 01/17/2017 0700 01/23/2017 0732 02/01/2017 0950 01/21/17 0540 01/21/17 1416 01/22/17 0520 01/23/17 0520  WBC 7.2 19.3*  --   --  9.8  --  8.0 10.3  CREATININE 0.67 1.00  --   --  1.14*  --  0.99 0.97  LATICACIDVEN  --   --  5.0* 1.3  --   --   --   --   VANCOTROUGH  --   --   --   --   --  12*  --   --     Estimated Creatinine Clearance: 51 mL/min (by C-G formula based on SCr of 0.97 mg/dL).    No Known Allergies  Antimicrobials this admission: cefepime 7/4 >>  vancomycin 7/4 >>   Dose adjustments this admission:  Microbiology results: UCx: NG Bcx NGTD TA: pending  Thank you for allowing pharmacy to be a part of this patient's care.  Duglas Heier A 01/23/2017 1:02 PM

## 2017-01-23 NOTE — Progress Notes (Signed)
ANTICOAGULATION CONSULT NOTE - Initial Consult  Pharmacy Consult for heparin Indication: DVT  No Known Allergies  Patient Measurements: Height: 5\' 7"  (170.2 cm) Weight: 208 lb 8.9 oz (94.6 kg) IBW/kg (Calculated) : 61.6 Heparin Dosing Weight: 82.3 kg  Vital Signs: Temp: 97.9 F (36.6 C) (07/07 2100) BP: 87/67 (07/07 2100) Pulse Rate: 129 (07/07 2100)  Labs:  Recent Labs  01/21/17 0540 01/21/17 1416 01/22/17 0520 01/23/17 0520 01/23/17 1708  HGB 7.8*  --  7.2* 8.2* 7.8*  HCT 25.1*  --  23.3* 25.1* 24.6*  PLT 587*  --  470* 577* 538*  APTT  --   --   --   --  35  LABPROT  --   --   --   --  14.8  INR  --   --   --   --  1.15  CREATININE 1.14*  --  0.99 0.97  --   TROPONINI  --  <0.03 <0.03  --   --     Estimated Creatinine Clearance: 51 mL/min (by C-G formula based on SCr of 0.97 mg/dL).   Medical History: Past Medical History:  Diagnosis Date  . Adenomatous polyps   . Age-related osteoporosis without current pathological fracture   . Anemia   . Anxiety   . Atherosclerosis of aorta ([place])   . Atrial fibrillation with RVR ([place])   . Barrett's esophagus without dysplasia   . CHF (congestive heart failure) ([place])   . Colitis   . COPD (chronic obstructive pulmonary disease) ([place])   . Dementia   . Depression   . Diabetes mellitus type II, uncontrolled ([place])   . Diverticulitis   . Diverticulosis of intestine without perforation or abscess without bleeding   . Edema   . Essential hemorrhagic thrombocythemia ([place])   . Gastrointestinal hemorrhage   . High anion gap metabolic acidosis   . Hyperlipidemia   . Hypertension   . Hypertensive chronic kidney disease with stage 1 through stage 4 chronic kidney disease, or unspecified chronic kidney disease   . Hypothyroidism   . Hypoxemia   . Iron deficiency anemia   . Lactic acidosis   . Left bundle branch block   . Nonrheumatic mitral valve insufficiency   . Osteoarthritis   . Other psychoactive substance  dependence, in remission ([place])   . Other specified symptoms and signs involving the circulatory and respiratory systems   . Paroxysmal atrial fibrillation ([facility])    s/p cardioversion  . Personal history of peptic ulcer disease   . Pulmonary edema   . PVD (peripheral vascular disease) ([place]) 05/14/2015  . Recurrent pneumonia    history of  . Respiratory failure with hypoxia and hypercapnia ([place])   . Type 2 diabetes mellitus with diabetic chronic kidney disease ([place])   . Type 2 diabetes mellitus with diabetic polyneuropathy ([place])   . Vascular dementia without behavioral disturbance   . Vascular disorder of intestine (H[facility])     Medications:  Infusions:  . amiodarone 30 mg/hr (01/23/17 2008)  . ceFEPIme (MAXIPIME) 2 GM IVP Stopped (01/23/17 2036)  . feeding supplement (VITAL HIGH PROTEIN) 1,000 mL (01/23/17 1900)  . fentaNYL infusion INTRAVENOUS 25 mcg/hr (01/23/17 2045)  . heparin    . norepinephrine (LEVOPHED) Adult infusion 5.973 mcg/min (01/23/17 1900)  . vancomycin      Assessment: 38 yof with brachial vein thrombus on [place]. Pharmacy consulted to start UFH for DVT.  Goal of Therapy:  Heparin level 0.3-0.7 units/ml Monitor platelets by anticoagulation protocol:  Yes   Plan:  Give 4000 units bolus x 1 Start heparin infusion at 1300 units/hr Check anti-Xa level in 8 hours and daily while on heparin Continue to monitor H&H and platelets  Laural Benes, Pharm.D., BCPS Clinical Pharmacist 01/23/2017,9:18 PM

## 2017-01-23 NOTE — Progress Notes (Signed)
Pulmonary / Critical care  Brachial vein thrombus noted on ultrasound Will obtain stat PT/INR/PTT and started on heparin protocol  Hermelinda Dellen, D.O.

## 2017-01-23 NOTE — Progress Notes (Signed)
[facility] NAME: Theresa Vasquez    MR#:  [id number]  DATE OF BIRTH:  05-05-1931  SUBJECTIVE:   Patient here due to acute respiratory failure, sepsis with septic shock. Remains critically ill and intubated and back on vasopressors. HR labile and in a. Fib w/ RVR and now on Amio gtt. Continues to have fever spikes.    REVIEW OF SYSTEMS:    Review of Systems  Unable to perform ROS: Intubated    Nutrition: Tube feeds Tolerating Diet: yes Tolerating PT:  Await Eval once extubated.   DRUG ALLERGIES:  No Known Allergies  VITALS:  Blood pressure 120/61, pulse (!) 137, temperature (!) 104.2 F (40.1 C), resp. rate 13, height _0  (1.702 m), weight 94.6 kg (208 lb 8.9 oz), SpO2 94 %.  PHYSICAL EXAMINATION:   Physical Exam  GENERAL:  83 y.o.-year-old patient lying in bed sedated & intubated.  EYES: Pupils equal, round, reactive to light. No scleral icterus.   HEENT: Head atraumatic, normocephalic. ET and OG tube in place.  NECK:  Supple, no jugular venous distention. No thyroid enlargement, no tenderness.  LUNGS: Normal breath sounds bilaterally, no wheezing, rales, rhonchi. No use of accessory muscles of respiration.  CARDIOVASCULAR: S1, S2 normal. No murmurs, rubs, or gallops.  ABDOMEN: Soft, nontender, nondistended. Bowel sounds present. No organomegaly or mass.  EXTREMITIES: No cyanosis, clubbing or edema b/l.   Signs of chronic venous stasis bilaterally. NEUROLOGIC: Sedated and intubated but opens eyes to verbal command.   PSYCHIATRIC: Sedated and intubated SKIN: No obvious rash, lesion, or ulcer. Skin tears on Right Leg and left elbow.    LABORATORY PANEL:   CBC  Recent Labs Lab 01/23/17 0520  WBC 10.3  HGB 8.2*  HCT 25.1*  PLT 577*   ------------------------------------------------------------------------------------------------------------------  Chemistries   Recent Labs Lab 01/21/17 0540  01/23/17 0520   NA 138  < > 140  K 4.2  < > 4.6  CL 100*  < > 100*  CO2 31  < > 34*  GLUCOSE 200*  < > 218*  BUN 25*  < > 37*  CREATININE 1.14*  < > 0.97  CALCIUM 7.9*  < > 8.1*  MG  --   --  2.3  AST 27  --   --   ALT 12*  --   --   ALKPHOS 41  --   --   BILITOT 0.5  --   --   < > = values in this interval not displayed. ------------------------------------------------------------------------------------------------------------------  Cardiac Enzymes  Recent Labs Lab 01/22/17 0520  TROPONINI <0.03   ------------------------------------------------------------------------------------------------------------------  RADIOLOGY:  Dg Chest Port 1 View  Result Date: 01/23/2017 CLINICAL DATA:  Initial evaluation for acute respiratory failure. EXAM: PORTABLE CHEST 1 VIEW COMPARISON:  Prior radiograph from 01/22/2017. FINDINGS: Patient remains intubated with the tip of an endotracheal tube positioned 3.2 cm above the carina. Tip and side hole of an enteric tube overlies the stomach. Right-sided PICC catheter tip overlies the cavoatrial junction. Stable cardiomegaly. Mediastinal silhouette normal. Aortic atherosclerosis. Lungs mildly hypoinflated. Mild diffuse pulmonary edema, improved from previous. Small bilateral pleural effusions, left greater than right, also likely improved. Dense retrocardiac left lower lobe opacity likely atelectasis and/ or edema or effusion. No other focal infiltrates. No pneumothorax. Osseous structures unchanged. IMPRESSION: 1. Support apparatus in satisfactory position as above. 2. Stable cardiomegaly with persistent but improved pulmonary edema as compared to 01/22/17. Persistent small bilateral pleural effusions, also improved.  3. Dense left lower lobe retrocardiac opacity, likely atelectasis and/or edema or effusion. Electronically Signed   By: Jeannine Boga M.D.   On: 01/23/2017 06:14   Dg Chest Port 1 View  Result Date: 01/22/2017 CLINICAL DATA:  Initial evaluation for  hypoxia, history of AFib, COPD. EXAM: PORTABLE CHEST 1 VIEW COMPARISON:  Prior radiograph from earlier the same day. FINDINGS: Endotracheal tube in place with tip position approximately 4.1 cm above the carina. Tip and side hole of enteric tube overlies the stomach. Right PICC catheter in place with tip overlying the distal SVC, unchanged. Stable cardiomegaly. Mediastinal silhouette unchanged. Aortic atherosclerosis. Lungs are hypoinflated. Diffuse pulmonary vascular congestion with interstitial prominence and bilateral airspace opacities, most consistent with diffuse pulmonary edema, progressed relative to previous. Probable left pleural effusion. Associated left basilar opacity likely reflects atelectasis/ edema. Superimposed infiltrate would be difficult to exclude. No pneumothorax. Osseous structures unchanged. IMPRESSION: 1. Support apparatus in satisfactory position as above. 2. Cardiomegaly with widespread diffuse pulmonary edema, consistent with congestive heart failure. Changes have worsened relative to prior radiograph performed earlier the same day. 3. Probable left pleural effusion. 4. Superimposed left basilar opacity, likely atelectasis/edema. Superimposed infiltrate would be difficult to exclude and could be considered in the correct clinical setting. Electronically Signed   By: Jeannine Boga M.D.   On: 01/22/2017 21:23   Dg Chest Port 1 View  Result Date: 01/22/2017 CLINICAL DATA:  Respiratory failure EXAM: PORTABLE CHEST 1 VIEW COMPARISON:  January 21, 2017 FINDINGS: Endogastric tube is identified in good position and distal tip 3.7 cm from carina. Nasogastric tube is identified distal tip not included on the film but is at least in the stomach. Right jugular central venous line identified distal tip in the superior vena cava. The heart size and mediastinal contours are stable. The heart size is enlarged. There is pulmonary edema. IMPRESSION: Congestive heart failure. Electronically Signed    By: Abelardo Diesel M.D.   On: 01/22/2017 07:12     ASSESSMENT AND PLAN:   83 year old female with a known history of Atrial fibrillation, anxiety, dementia, depression, diabetes, hypertension, history of diastolic CHF, COPD, hypothyroidism, peripheral vascular disease presented to the hospital due to shortness of breath and respiratory distress.  1. Sepsis-patient met criteria given her hypothermia, elevated lactic acid, leukocytosis. -Source thought to be pulmonary but continues to have fever spikes. Blood cultures have remained negative. -We'll get Dopplers of lower extremity still rule out DVT. Infectious disease consult pending. Continue empiric vancomycin, cefepime. As per intensivist also plan to get CT abdomen pelvis to look for further source of fever  2. Acute respiratory failure with hypoxia-secondary to suspected pneumonia and also underlying pulmonary edema. -currently intubated sedated. Currently on 40% FiO2 with 5 of PEEP. Cont. Weaning as per Pulmonary.  -Continue IV antibiotics for pneumonia as mentioned above.    3. Shock-likely septic shock given patient's fever, tachycardia. Continue IV fluids, IV vasopressors. -Follow hemodynamics. Keep MEP greater than 55.  4. Atrial fibrillation with rapid ventricular response-patient's heart rates are labile due to sepsis and underlying fever. Patient is hypotensive, therefore started on amiodarone drip. -Patient is not on long-term anticoagulation given her high fall risk.  5. CHF-acute on chronic diastolic dysfunction. Cannot give diuretics as pt. Is Hypotensive and in shock and currently on vasopressor.   -Her recent echocardiogram in June which showed normal ejection fraction of 55-60%.   6. Acute kidney injury-secondary to sepsis, septic shock. It's improved and creatinine back to baseline with fluids and  vasopressors. -Follow BUN/creatinine urine output  7. Myoclonic jerks-secondary to patient's high dose of gabapentin and  acute kidney injury. Much improved since yesterday. Gabapentin dose has been tapered.  8. History of dementia-continue Aricept, Depakote.  9. Hypothyroidism-continue Synthroid.  10. COPD-no acute exacerbation.  11. GERD-continue Protonix.  12. Neuropathy-continue gabapentin. Lowered dose due to Myoclonic Jerking   13. Essential hypertension-BP on low side and anti-HTN on hold. .   All the records are reviewed and case discussed with Care Management/Social Worker. Management plans discussed with the patient, family and they are in agreement.  CODE STATUS: DNR  DVT Prophylaxis: Lovenox  TOTAL TIME TAKING CARE OF THIS PATIENT: 30 minutes.   POSSIBLE D/C IN 3-4 DAYS, DEPENDING ON CLINICAL CONDITION.   Henreitta Leber M.D on 01/23/2017 at 12:20 PM  Between 7am to 6pm - Pager - [phone]  After 6pm go to [email or web address] - [facility]  Office  [phone]  CC: Primary care physician; Valerie Roys, DO

## 2017-01-23 NOTE — Progress Notes (Signed)
PULMONARY / CRITICAL CARE MEDICINE   Name: Theresa Vasquez MRN: [id number] DOB: 1931/03/14    ADMISSION DATE:  01/19/2017 CONSULTATION DATE: 007/30/2018  PT PROFILE: 83 y.o. F chronically debilitated with multiple medical problems hospitalized 06/29-07/03 with MSSA pneumonia and bacteremia. Was home less than 12 hours and returned to emergency department with respiratory distress, intubated. CXR consistent with pulmonary edema.  MAJOR EVENTS/TEST RESULTS: 07/04 Admitted as above with working diagnoses of recent MSSA PNA, CAF, pulmonary edema, acute bronchospasm, hypotension, AKI, lactic acidosis, advanced dementia, chronic debilitation   INDWELLING DEVICES:: ETT 07/04 >>  RUMI PICC 07/03 >>   MICRO DATA: Urine 07/04 >> NEG Resp 07/04 >>   ANTIMICROBIALS:  Vanc 07/04 >>  Cefepime 07/04 >>    SUBJECTIVE:  Over the last 24 hours patient had mucus plugging in the left mainstem bronchus. Improved with frequent suctioning, chest percussive therapy, briefly was on Mucomyst and nebulized hypertonic saline. Chest x-ray this morning showed significant improvement in atelectasis  VITAL SIGNS: BP 108/68   Pulse (!) 125   Temp (!) 100.6 F (38.1 C)   Resp 13   Ht 5\' 7"  (1.702 m)   Wt 94.6 kg (208 lb 8.9 oz)   SpO2 96%   BMI 32.66 kg/m    VENTILATOR SETTINGS: Vent Mode: PRVC FiO2 (%):  [40 %-65 %] 40 % Set Rate:  [14 bmp] 14 bmp Vt Set:  [500 mL] 500 mL PEEP:  [5 cmH20] 5 cmH20 Plateau Pressure:  [17 cmH20] 17 cmH20  INTAKE / OUTPUT: I/O last 3 completed shifts: In: 1994.7 [I.V.:644.7; Blood:320; NG/GT:1030] Out: 2245 [Urine:2245]  PHYSICAL EXAMINATION: General: Intubated, sedated, RASS -4, not F/C  Neuro: CNs intact, MAEs HEENT: NCAT, sclerae white Cardiovascular: IRIR, no murmurs Lungs: Coarse throughout with distant scattered wheezes Abdomen: Obese, hypoactive BS, nontender Extremities: Multiple ecchymoses on all extremities, symmetric ankle and pedal  edema  LABS:  BMET  Recent Labs Lab 01/21/17 0540 01/22/17 0520 01/23/17 0520  NA 138 138 140  K 4.2 4.2 4.6  CL 100* 99* 100*  CO2 31 30 34*  BUN 25* 35* 37*  CREATININE 1.14* 0.99 0.97  GLUCOSE 200* 177* 218*    Electrolytes  Recent Labs Lab 01/17/17 2157  01/21/17 0540 01/22/17 0520 01/23/17 0520  CALCIUM 7.8*  < > 7.9* 8.1* 8.1*  MG 1.9  --   --   --  2.3  PHOS 3.3  --   --   --  4.0  < > = values in this interval not displayed.  CBC  Recent Labs Lab 01/21/17 0540 01/22/17 0520 01/23/17 0520  WBC 9.8 8.0 10.3  HGB 7.8* 7.2* 8.2*  HCT 25.1* 23.3* 25.1*  PLT 587* 470* 577*    Coag's No results for input(s): APTT, INR in the last 168 hours.  Sepsis Markers  Recent Labs Lab 01/29/2017 0700 01/28/2017 0732 02/12/2017 0950 01/21/17 0540 01/22/17 0520  LATICACIDVEN  --  5.0* 1.3  --   --   PROCALCITON 0.13  --   --  0.62 0.47    ABG  Recent Labs Lab 01/17/17 0915 01/30/2017 0812 01/22/17 2100  PHART 7.42 7.31* 7.34*  PCO2ART 48 58* 67*  PO2ART 91 82* 72*    Liver Enzymes  Recent Labs Lab 01/21/17 0540  AST 27  ALT 12*  ALKPHOS 41  BILITOT 0.5  ALBUMIN 2.5*    Cardiac Enzymes  Recent Labs Lab 01/20/17 1300 01/21/17 1416 01/22/17 0520  TROPONINI <0.03 <0.03 <0.03  Glucose  Recent Labs Lab 01/22/17 1149 01/22/17 1558 01/22/17 2007 01/22/17 2345 01/23/17 0518 01/23/17 0735  GLUCAP 198* 177* 192* 192* 211* 198*    CXR: CM, interstitial edema pattern, LLL ATX   ASSESSMENT / PLAN:  Acute on chronic hypoxic/hypercapnic respiratory failure. Continue on mechanical ventilation with respiratory driven protocol. Left lung atelectasis improved, will continue albuterol, Atrovent, will hold nebulized saline and Mucomyst at this time. We'll continue chest percussive therapy. Hold on bronchoscopy at this time. Continue cefepime and vancomycin along with albuterol, Atrovent and budesonide  Atrial Fibrillation. ventricular  response is variable, may require amiodarone bolus and drip  Hypotension. Presently on decreasing doses of norepinephrine, presently on 8 g  Advanced dementia. History, is on fentanyl infusion for sedation  Renal failure. More prerenal indices at this time    CCM time: 35 mins  The above time includes time spent in consultation with patient and/or family members and reviewing care plan on multidisciplinary rounds  Hermelinda Dellen, DO PCCM service Mobile [phone] Pager [phone] 01/23/2017 8:44 AM

## 2017-01-23 NOTE — Progress Notes (Signed)
Pt Fio2 adjusted throughout the night to mainrtain Sao2 above 92%.  ~1930 Pt Sao2 decreased to 80;s Rt increased Fio2 65% ~2055 Pt's Sao2 decreased fo 85% Rt increased Fio2 75% peep 7 NP informed at this time regarding pt's increased demand for Fio2. NP ordered ABG , CXR, and mucomyst Q4 ~2115 Suctioned thick mucoid, tenacious clear/white plug via ETT ~2245 Sao2 99% RT decreased Fio2 65%  ~0130 Sao2 99% Fio2 decreased to 55% ~0215 Sao2 99% Fio2 decreased to 45% ~0420 Sao2 98% Fio2 decre3ased to 40% Peep 7 Sao2 currently 95%

## 2017-01-23 NOTE — Progress Notes (Signed)
Pharmacy Antibiotic Note  Theresa Vasquez is a 83 y.o. female recently discharged on 7/3 with IV cefazolin for MRSA bacteremia and PNA  admitted on 02/01/2017 with sepsis.  Pharmacy has been consulted for vancomycin and cefepime dosing.  Plan: Vancomycin 1250 mg iv q 24 hours ordered.  Will check a trough with the 4th dose 7/7 at 1430.  7/7:  Will adjust cefepime to 2 g iv q 12 hours as patient still spiking fevers.  7/7: Vancomycin trough= 11 mcg/ml. Goal 15-20 mcg/ml. Will adjust dose to Vancomycin 1250 mg IV Q18H. Will check trough prior to 4th dose of new regimen. Watch patient renal fxn and for possible accumulation.    Height: 5\' 7"  (170.2 cm) Weight: 208 lb 8.9 oz (94.6 kg) IBW/kg (Calculated) : 61.6  Temp (24hrs), Avg:100.8 F (38.2 C), Min:100 F (37.8 C), Max:104.2 F (40.1 C)   Recent Labs Lab 01/18/17 0402 02/16/2017 0700 01/28/2017 0732 02/01/2017 0950 01/21/17 0540 01/21/17 1416 01/22/17 0520 01/23/17 0520 01/23/17 1419  WBC 7.2 19.3*  --   --  9.8  --  8.0 10.3  --   CREATININE 0.67 1.00  --   --  1.14*  --  0.99 0.97  --   LATICACIDVEN  --   --  5.0* 1.3  --   --   --   --   --   VANCOTROUGH  --   --   --   --   --  12*  --   --  11*    Estimated Creatinine Clearance: 51 mL/min (by C-G formula based on SCr of 0.97 mg/dL).    No Known Allergies  Antimicrobials this admission: cefepime 7/4 >>  vancomycin 7/4 >>   Dose adjustments this admission: 7/7 Vanc trough= 11:  1250mg  q24h to 1250mg  q18h  Microbiology results: UCx: NG Bcx NGTD TA: pending  Thank you for allowing pharmacy to be a part of this patient's care.  Darlynn Ricco A 01/23/2017 3:32 PM

## 2017-01-23 NOTE — Progress Notes (Signed)
  Amiodarone Drug - Drug Interaction Consult Note  Recommendations:  Amiodarone is metabolized by the cytochrome P450 system and therefore has the potential to cause many drug interactions. Amiodarone has an average plasma half-life of 50 days (range 20 to 100 days).   There is potential for drug interactions to occur several weeks or months after stopping treatment and the onset of drug interactions may be slow after initiating amiodarone.   []  Statins: Increased risk of myopathy. Simvastatin- restrict dose to 20mg  daily. Other statins: counsel patients to report any muscle pain or weakness immediately.  []  Anticoagulants: Amiodarone can increase anticoagulant effect. Consider warfarin dose reduction. Patients should be monitored closely and the dose of anticoagulant altered accordingly, remembering that amiodarone levels take several weeks to stabilize.  []  Antiepileptics: Amiodarone can increase plasma concentration of phenytoin, the dose should be reduced. Note that small changes in phenytoin dose can result in large changes in levels. Monitor patient and counsel on signs of toxicity.  []  Beta blockers: increased risk of bradycardia, AV block and myocardial depression. Sotalol - avoid concomitant use.  []   Calcium channel blockers (diltiazem and verapamil): increased risk of bradycardia, AV block and myocardial depression.  []   Cyclosporine: Amiodarone increases levels of cyclosporine. Reduced dose of cyclosporine is recommended.  []  Digoxin dose should be halved when amiodarone is started.  []  Diuretics: increased risk of cardiotoxicity if hypokalemia occurs.  []  Oral hypoglycemic agents (glyburide, glipizide, glimepiride): increased risk of hypoglycemia. Patient's glucose levels should be monitored closely when initiating amiodarone therapy.   []  Drugs that prolong the QT interval:  Torsades de pointes risk may be increased with concurrent use - avoid if possible.  Monitor QTc, also  keep magnesium/potassium WNL if concurrent therapy can't be avoided. Marland Kitchen Antibiotics: e.g. fluoroquinolones, erythromycin. . Antiarrhythmics: e.g. quinidine, procainamide, disopyramide, sotalol. . Antipsychotics: e.g. phenothiazines, haloperidol.  . Lithium, tricyclic antidepressants, and methadone.  Zofran ordered on PRN basis has not been documented as given; ondansetron and amiodarone together should be avoided when possible due to OTc prolongation risk.  Venlafaxine is noted to have indeterminate risk of QTC prolongation and suggested to consider therapy modification when possible.   Spoke with Dr Jefferson Fuel about zofran and venlafaxine - orders d/c'd.  Thank You,  Theresa Vasquez  01/23/2017 9:31 AM

## 2017-01-24 ENCOUNTER — Inpatient Hospital Stay: Payer: Medicare Other

## 2017-01-24 LAB — CBC
HCT: 26.9 % — ABNORMAL LOW (ref 35.0–47.0)
HEMOGLOBIN: 8.4 g/dL — AB (ref 12.0–16.0)
MCH: 28.6 pg (ref 26.0–34.0)
MCHC: 31.4 g/dL — ABNORMAL LOW (ref 32.0–36.0)
MCV: 91.2 fL (ref 80.0–100.0)
PLATELETS: 571 10*3/uL — AB (ref 150–440)
RBC: 2.95 MIL/uL — AB (ref 3.80–5.20)
RDW: 15.3 % — ABNORMAL HIGH (ref 11.5–14.5)
WBC: 13.3 10*3/uL — AB (ref 3.6–11.0)

## 2017-01-24 LAB — BASIC METABOLIC PANEL
ANION GAP: 8 (ref 5–15)
BUN: 40 mg/dL — AB (ref 6–20)
CHLORIDE: 98 mmol/L — AB (ref 101–111)
CO2: 32 mmol/L (ref 22–32)
Calcium: 8.3 mg/dL — ABNORMAL LOW (ref 8.9–10.3)
Creatinine, Ser: 0.8 mg/dL (ref 0.44–1.00)
GFR calc Af Amer: >60 mL/min (ref >60)
Glucose, Bld: 259 mg/dL — ABNORMAL HIGH (ref 65–99)
POTASSIUM: 4.8 mmol/L (ref 3.5–5.1)
SODIUM: 138 mmol/L (ref 135–145)

## 2017-01-24 LAB — GLUCOSE, CAPILLARY
GLUCOSE-CAPILLARY: 245 mg/dL — AB (ref 65–99)
GLUCOSE-CAPILLARY: 254 mg/dL — AB (ref 65–99)
Glucose-Capillary: 269 mg/dL — ABNORMAL HIGH (ref 65–99)

## 2017-01-24 LAB — MAGNESIUM: MAGNESIUM: 2.4 mg/dL (ref 1.7–2.4)

## 2017-01-24 LAB — HEPARIN LEVEL (UNFRACTIONATED): HEPARIN UNFRACTIONATED: 0.38 [IU]/mL (ref 0.30–0.70)

## 2017-01-24 LAB — PROTIME-INR
INR: 1.11
PROTHROMBIN TIME: 14.4 s (ref 11.4–15.2)

## 2017-01-24 LAB — PHOSPHORUS: Phosphorus: 3.5 mg/dL (ref 2.5–4.6)

## 2017-01-24 MED ORDER — SODIUM CHLORIDE 0.9 % IV SOLN
2.0000 mg/h | INTRAVENOUS | Status: DC
  Administered 2017-01-24: 1 mg/h via INTRAVENOUS
  Filled 2017-01-24: qty 10

## 2017-01-24 MED ORDER — SCOPOLAMINE 1 MG/3DAYS TD PT72
1.0000 | MEDICATED_PATCH | TRANSDERMAL | Status: DC
  Administered 2017-01-24: 1.5 mg via TRANSDERMAL
  Filled 2017-01-24: qty 1

## 2017-01-24 MED ORDER — LORAZEPAM 2 MG/ML IJ SOLN: 1.0000 mg | INTRAMUSCULAR | Status: DC | PRN

## 2017-01-25 ENCOUNTER — Telehealth: Payer: Self-pay | Admitting: Internal Medicine

## 2017-01-25 LAB — CULTURE, RESPIRATORY W GRAM STAIN

## 2017-01-25 LAB — CULTURE, RESPIRATORY

## 2017-01-25 NOTE — Telephone Encounter (Signed)
Death certificated placed in DS folder for signature.

## 2017-01-25 NOTE — Telephone Encounter (Signed)
Recieved Death Certificate from [employer] to [employer], [place]

## 2017-01-26 NOTE — Telephone Encounter (Signed)
Informed funeral home death cert is ready for pick up. Nothing further needed. 

## 2017-01-27 DIAGNOSIS — J189 Pneumonia, unspecified organism: Secondary | ICD-10-CM

## 2017-01-27 LAB — PTT FACTOR INHIBITOR (MIXING STUDY): aPTT: 26.7 s (ref 22.9–30.2)

## 2017-01-27 LAB — CULTURE, BLOOD (ROUTINE X 2)
CULTURE: NO GROWTH
Culture: NO GROWTH
SPECIAL REQUESTS: ADEQUATE

## 2017-02-17 NOTE — Progress Notes (Signed)
Patient extubated, on morphine drip and family is at bedside.

## 2017-02-17 NOTE — Progress Notes (Signed)
PULMONARY / CRITICAL CARE MEDICINE   Name: BRINDA FOCHT MRN: [id number] DOB: 02/22/1931    ADMISSION DATE:  02/01/2017 CONSULTATION DATE: 007/27/2018  PT PROFILE: 83 y.o. F chronically debilitated with multiple medical problems hospitalized 06/29-07/03 with MSSA pneumonia and bacteremia. Was home less than 12 hours and returned to emergency department with respiratory distress, intubated. CXR consistent with pulmonary edema.  MAJOR EVENTS/TEST RESULTS: 07/04 Admitted as above with working diagnoses of recent MSSA PNA, CAF, pulmonary edema, acute bronchospasm, hypotension, AKI, lactic acidosis, advanced dementia, chronic debilitation   INDWELLING DEVICES:: ETT 07/04 >>  RUMI PICC 07/03 >>   MICRO DATA: Urine 07/04 >> NEG Resp 07/04 >>   ANTIMICROBIALS:  Vanc 07/04 >>  Cefepime 07/04 >>    SUBJECTIVE:  Over the last 24 hours patient had mucus plugging in the left mainstem bronchus. Improved with frequent suctioning, chest percussive therapy, briefly was on Mucomyst and nebulized hypertonic saline. Chest x-ray this morning showed significant improvement in atelectasis  VITAL SIGNS: BP 132/89   Pulse (!) 151   Temp 99.7 F (37.6 C)   Resp 20   Ht 5\' 7"  (1.702 m)   Wt 94.6 kg (208 lb 8.9 oz)   SpO2 90%   BMI 32.66 kg/m    VENTILATOR SETTINGS: Vent Mode: PRVC FiO2 (%):  [40 %] 40 % Set Rate:  [14 bmp] 14 bmp Vt Set:  [500 mL] 500 mL PEEP:  [7 cmH20] 7 cmH20 Plateau Pressure:  [26 cmH20] 26 cmH20  INTAKE / OUTPUT: I/O last 3 completed shifts: In: 3280.5 [I.V.:1280.5; Blood:320; NG/GT:1630; IV Piggyback:50] Out: 2395 [YTKPT:4656; Emesis/NG output:450]  PHYSICAL EXAMINATION: General: Intubated, sedated, RASS -4, not F/C  Neuro: CNs intact, MAEs HEENT: NCAT, sclerae white Cardiovascular: IRIR, no murmurs Lungs: Coarse throughout with distant scattered wheezes Abdomen: Obese, hypoactive BS, nontender Extremities: Multiple ecchymoses on all extremities, symmetric  ankle and pedal edema  LABS:  BMET  Recent Labs Lab 01/22/17 0520 01/23/17 0520 February 01, 2017 0311  NA 138 140 138  K 4.2 4.6 4.8  CL 99* 100* 98*  CO2 30 34* 32  BUN 35* 37* 40*  CREATININE 0.99 0.97 0.80  GLUCOSE 177* 218* 259*    Electrolytes  Recent Labs Lab 01/17/17 2157  01/22/17 0520 01/23/17 0520 February 01, 2017 0311  CALCIUM 7.8*  < > 8.1* 8.1* 8.3*  MG 1.9  --   --  2.3 2.4  PHOS 3.3  --   --  4.0 3.5  < > = values in this interval not displayed.  CBC  Recent Labs Lab 01/23/17 0520 01/23/17 1708 01-Feb-2017 0311  WBC 10.3 9.7 13.3*  HGB 8.2* 7.8* 8.4*  HCT 25.1* 24.6* 26.9*  PLT 577* 538* 571*    Coag's  Recent Labs Lab 01/23/17 1708 01-Feb-2017 0558  APTT 35  --   INR 1.15 1.11    Sepsis Markers  Recent Labs Lab 02/02/2017 0700 02/15/2017 0732 01/23/2017 0950 01/21/17 0540 01/22/17 0520  LATICACIDVEN  --  5.0* 1.3  --   --   PROCALCITON 0.13  --   --  0.62 0.47    ABG  Recent Labs Lab 01/17/17 0915 02/02/2017 0812 01/22/17 2100  PHART 7.42 7.31* 7.34*  PCO2ART 48 58* 67*  PO2ART 91 82* 72*    Liver Enzymes  Recent Labs Lab 01/21/17 0540  AST 27  ALT 12*  ALKPHOS 41  BILITOT 0.5  ALBUMIN 2.5*    Cardiac Enzymes  Recent Labs Lab 01/20/17 1300 01/21/17 1416 01/22/17 0520  TROPONINI <0.03 <0.03 <0.03    Glucose  Recent Labs Lab 01/23/17 1121 01/23/17 1645 01/23/17 1954 02/18/17 0011 2017/02/18 0414 Feb 18, 2017 0710  GLUCAP 252* 231* 241* 245* 254* 269*    CXR: CM, interstitial edema pattern, LLL ATX   ASSESSMENT / PLAN:  Acute on chronic hypoxic/hypercapnic respiratory failure. Chest x-ray reveals blunting of the right costophrenic angle, pleural effusion versus atelectasis/consolidation. Tracheal aspirate performed yesterday so far is negative. Continue on mechanical ventilation with respiratory driven protocol. Left lung atelectasis improved, will continue albuterol, Atrovent. We'll continue chest percussive therapy.  Continue cefepime and vancomycin  Atrial Fibrillation. Ventricular response ranging from 110-120 on amiodarone infusion  Hypotension. On norepinephrine   Advanced dementia. History, is on fentanyl infusion for sedation   Renal failure. More prerenal indices at this time  Brachial venous clot. On anticoagulation.   Hyperglycemia. On coverage  Patient is not weanable at this time  CCM time: 40 mins    Hermelinda Dellen, DO PCCM service Mobile [phone] Pager [phone] 18-Feb-2017 8:03 AM

## 2017-02-17 NOTE — Progress Notes (Signed)
Per Dr Jefferson Fuel stopping heparin drip. Per son awaiting for other family to transition to comfort care.

## 2017-02-17 NOTE — Progress Notes (Signed)
Patient pronounced by Delton See RN and Tiburcio Pea RN 17:33 PM. No respiration, heart sounds when auscultation performed, EKG- patient is asystole. Called [facility] ref [phone] Spoke with Roderic Ovens patient is not an eligible donor. Called E-link and Dr. Emmit Alexanders notified, supervisor notified. Family at bedside.

## 2017-02-17 NOTE — Progress Notes (Signed)
Dr Jefferson Fuel aware of patients lack of access. At this point I cannot hang antibiotics. Awaiting for Vascular wellness for line placement.

## 2017-02-17 NOTE — Progress Notes (Signed)
[facility] [place] NAME: Theresa Vasquez    MR#:  [id number]  DATE OF BIRTH:  [birth year]  SUBJECTIVE:   Patient remains critically ill, intubated and sedated. Family at bedside. Plan for terminal extubation and comfort care once other family members arrive.  REVIEW OF SYSTEMS:    Review of Systems  Unable to perform ROS: Intubated    Nutrition: Tube feeds Tolerating Diet: yes Tolerating PT:  Await Eval once extubated.   DRUG ALLERGIES:  No Known Allergies  VITALS:  Blood pressure (!) 83/65, pulse (!) 133, temperature 100 F (37.8 C), resp. rate 14, height 5\' 7"  (1.702 m), weight 94.6 kg (208 lb 8.9 oz), SpO2 92 %.  PHYSICAL EXAMINATION:   Physical Exam  GENERAL:  83 y.o.-year-old patient lying in bed sedated & intubated.  EYES: Pupils equal, round, reactive to light. No scleral icterus.   HEENT: Head atraumatic, normocephalic. ET and OG tube in place.  NECK:  Supple, no jugular venous distention. No thyroid enlargement, no tenderness.  LUNGS: Normal breath sounds bilaterally, no wheezing, rales, rhonchi. No use of accessory muscles of respiration.  CARDIOVASCULAR: S1, S2 normal. No murmurs, rubs, or gallops.  ABDOMEN: Soft, nontender, nondistended. Bowel sounds present. No organomegaly or mass.  EXTREMITIES: No cyanosis, clubbing or edema b/l.   Signs of chronic venous stasis bilaterally. NEUROLOGIC: Sedated and intubated but opens eyes to verbal command.   PSYCHIATRIC: Sedated and intubated SKIN: No obvious rash, lesion, or ulcer. Skin tears on Right Leg and left elbow.    LABORATORY PANEL:   CBC  Recent Labs Lab 02-11-2017 0311  WBC 13.3*  HGB 8.4*  HCT 26.9*  PLT 571*   ------------------------------------------------------------------------------------------------------------------  Chemistries   Recent Labs Lab 01/21/17 0540  2017-02-11 0311  NA 138  < > 138  K 4.2  < > 4.8  CL 100*  < > 98*  CO2 31  < >  32  GLUCOSE 200*  < > 259*  BUN 25*  < > 40*  CREATININE 1.14*  < > 0.80  CALCIUM 7.9*  < > 8.3*  MG  --   < > 2.4  AST 27  --   --   ALT 12*  --   --   ALKPHOS 41  --   --   BILITOT 0.5  --   --   < > = values in this interval not displayed. ------------------------------------------------------------------------------------------------------------------  Cardiac Enzymes  Recent Labs Lab 01/22/17 0520  TROPONINI <0.03   ------------------------------------------------------------------------------------------------------------------  RADIOLOGY:  US Venous Img Upper Uni Left  Result Date: 01/23/2017 CLINICAL DATA:  Left upper extremity edema. EXAM: LEFT UPPER EXTREMITY VENOUS DOPPLER ULTRASOUND TECHNIQUE: Gray-scale sonography with graded compression, as well as color Doppler and duplex ultrasound were performed to evaluate the upper extremity deep venous system from the level of the subclavian vein and including the jugular, axillary, basilic, radial, ulnar and upper cephalic vein. Spectral Doppler was utilized to evaluate flow at rest and with distal augmentation maneuvers. COMPARISON:  None. FINDINGS: Contralateral Subclavian Vein: Respiratory phasicity is normal and symmetric with the symptomatic side. No evidence of thrombus. Normal compressibility. Internal Jugular Vein: No evidence of thrombus. Normal compressibility, respiratory phasicity and response to augmentation. Subclavian Vein: No evidence of thrombus. Normal compressibility, respiratory phasicity and response to augmentation. Axillary Vein: No evidence of thrombus. Normal compressibility, respiratory phasicity and response to augmentation. Cephalic Vein: No evidence of thrombus. Normal compressibility, respiratory phasicity and response to augmentation. Basilic  Vein: Not evaluated due to overlying bandaging material. Brachial Veins: Occlusive thrombus is demonstrated within 1 of the paired brachial veins. Radial Veins: Not  evaluated due to overlying bandaging material. Ulnar Veins: Not evaluated due to overlying bandaging material. Venous Reflux:  None visualized. Other Findings:  None visualized. IMPRESSION: Thrombus is demonstrated within 1 of the paired brachial veins. The basilic, radial and ulnar veins are not able to be evaluated secondary to overlying bandaging material. These results will be called to the ordering clinician or representative by the Radiologist Assistant, and communication documented in the PACS or zVision Dashboard. Electronically Signed   By: Lovey Newcomer M.D.   On: 01/23/2017 16:20   Dg Chest Port 1 View  Result Date: 2017-02-06 CLINICAL DATA:  Respiratory failure. EXAM: PORTABLE CHEST 1 VIEW COMPARISON:  January 23, 2017 FINDINGS: The ETT is in good position. The right PICC line is stable. Mild edema persists. New infiltrate in the right lung base. No other interval changes. IMPRESSION: 1. New right basilar infiltrate worrisome for pneumonia or aspiration. 2. Persistent mild edema. 3. Stable support apparatus. Electronically Signed   By: Dorise Bullion III M.D   On: 06-Feb-2017 06:50   Dg Chest Port 1 View  Result Date: 01/23/2017 CLINICAL DATA:  Initial evaluation for acute respiratory failure. EXAM: PORTABLE CHEST 1 VIEW COMPARISON:  Prior radiograph from 01/22/2017. FINDINGS: Patient remains intubated with the tip of an endotracheal tube positioned 3.2 cm above the carina. Tip and side hole of an enteric tube overlies the stomach. Right-sided PICC catheter tip overlies the cavoatrial junction. Stable cardiomegaly. Mediastinal silhouette normal. Aortic atherosclerosis. Lungs mildly hypoinflated. Mild diffuse pulmonary edema, improved from previous. Small bilateral pleural effusions, left greater than right, also likely improved. Dense retrocardiac left lower lobe opacity likely atelectasis and/ or edema or effusion. No other focal infiltrates. No pneumothorax. Osseous structures unchanged. IMPRESSION: 1.  Support apparatus in satisfactory position as above. 2. Stable cardiomegaly with persistent but improved pulmonary edema as compared to 01/22/17. Persistent small bilateral pleural effusions, also improved. 3. Dense left lower lobe retrocardiac opacity, likely atelectasis and/or edema or effusion. Electronically Signed   By: Jeannine Boga M.D.   On: 01/23/2017 06:14   Dg Chest Port 1 View  Result Date: 01/22/2017 CLINICAL DATA:  Initial evaluation for hypoxia, history of AFib, COPD. EXAM: PORTABLE CHEST 1 VIEW COMPARISON:  Prior radiograph from earlier the same day. FINDINGS: Endotracheal tube in place with tip position approximately 4.1 cm above the carina. Tip and side hole of enteric tube overlies the stomach. Right PICC catheter in place with tip overlying the distal SVC, unchanged. Stable cardiomegaly. Mediastinal silhouette unchanged. Aortic atherosclerosis. Lungs are hypoinflated. Diffuse pulmonary vascular congestion with interstitial prominence and bilateral airspace opacities, most consistent with diffuse pulmonary edema, progressed relative to previous. Probable left pleural effusion. Associated left basilar opacity likely reflects atelectasis/ edema. Superimposed infiltrate would be difficult to exclude. No pneumothorax. Osseous structures unchanged. IMPRESSION: 1. Support apparatus in satisfactory position as above. 2. Cardiomegaly with widespread diffuse pulmonary edema, consistent with congestive heart failure. Changes have worsened relative to prior radiograph performed earlier the same day. 3. Probable left pleural effusion. 4. Superimposed left basilar opacity, likely atelectasis/edema. Superimposed infiltrate would be difficult to exclude and could be considered in the correct clinical setting. Electronically Signed   By: Jeannine Boga M.D.   On: 01/22/2017 21:23     ASSESSMENT AND PLAN:   83 year old female with a known history of Atrial fibrillation, anxiety, dementia,  depression, diabetes, hypertension, history of diastolic CHF, COPD, hypothyroidism, peripheral vascular disease presented to the hospital due to shortness of breath and respiratory distress.  1. Sepsis 2. Acute respiratory failure with hypoxia 3. Shock 4. Atrial fibrillation with rapid ventricular response 5. CHF 6. Acute kidney injury 7. History of dementia 8. Hypothyroidism-continue Synthroid. 9. COPD-no acute exacerbation. 10. GERD-continue Protonix. 11. Neuropathy-continue gabapentin. Lowered dose due to Myoclonic Jerking   Patient remains critically ill, intubated and sedated on vasopressors still having fever spikes. Despite aggressive care with IV fluids, vasopressors, IV antibiotics patient's condition is not improving. [name] and other family members do not want to pursue aggressive care. Patient already is a DO NOT RESUSCITATE. Patient is to be placed on a comfort care only once other family members arrive and patient to be terminally extubated. This was discussed with the patient's son at bedside who is in agreement. Also discussed this with the intensivist.   All the records are reviewed and case discussed with Care Management/Social Worker. Management plans discussed with the patient, family and they are in agreement.  CODE STATUS: DNR  DVT Prophylaxis: Lovenox  TOTAL TIME TAKING CARE OF THIS PATIENT: 30 minutes.   POSSIBLE D/C unclear, DEPENDING ON CLINICAL CONDITION.   Henreitta Leber M.D on Feb 20, 2017 at 12:20 PM  Between 7am to 6pm - Pager - [phone]  After 6pm go to [email or web address] - [employer]  [facility]  Office  [phone]  CC: Primary care physician; Valerie Roys, DO

## 2017-02-17 NOTE — Progress Notes (Addendum)
Pharmacy Antibiotic Note  Theresa Vasquez is a 83 y.o. female recently discharged on 7/3 with IV cefazolin for MRSA bacteremia and PNA  admitted on 02/07/2017 with sepsis.  Pharmacy has been consulted for vancomycin and cefepime dosing.  Plan: Vancomycin 1250 mg iv q 24 hours ordered.  Will check a trough with the 4th dose 7/7 at 1430.  7/7:  Will adjust cefepime to 2 g iv q 12 hours as patient still spiking fevers.  7/7: Vancomycin trough= 11 mcg/ml. Goal 15-20 mcg/ml. Will adjust dose to Vancomycin 1250 mg IV Q18H. Will check trough prior to 4th dose of new regimen. Watch patient renal fxn and for possible accumulation.  7/8 patient lost IV access. F/u for restart of abx and adjustment of trough timing.    Height: 5\' 7"  (170.2 cm) Weight: 208 lb 8.9 oz (94.6 kg) IBW/kg (Calculated) : 61.6  Temp (24hrs), Avg:99.4 F (37.4 C), Min:97.7 F (36.5 C), Max:104.2 F (40.1 C)   Recent Labs Lab 01/21/2017 0700 02/14/2017 0732 02/16/2017 0950 01/21/17 0540 01/21/17 1416 01/22/17 0520 01/23/17 0520 01/23/17 1419 01/23/17 1708 01-27-2017 0311  WBC 19.3*  --   --  9.8  --  8.0 10.3  --  9.7 13.3*  CREATININE 1.00  --   --  1.14*  --  0.99 0.97  --   --  0.80  LATICACIDVEN  --  5.0* 1.3  --   --   --   --   --   --   --   VANCOTROUGH  --   --   --   --  12*  --   --  11*  --   --     Estimated Creatinine Clearance: 61.8 mL/min (by C-G formula based on SCr of 0.8 mg/dL).    No Known Allergies  Antimicrobials this admission: cefepime 7/4 >>  vancomycin 7/4 >>   Dose adjustments this admission: 7/7 Vanc trough= 11:  1250mg  q24h to 1250mg  q18h  Microbiology results: UCx: NG Bcx NGTD TA: pending  Thank you for allowing pharmacy to be a part of this patient's care.  Haddie Bruhl A 2017-01-27 12:13 PM

## 2017-02-17 NOTE — Progress Notes (Signed)
[facility] received PG to come to ICU room 8 because PT had died. Upon arrival [facility] found family with their pastor. Austin offered his condolences   Jan 27, 2017 1745  Clinical Encounter Type  Visited With Family  Visit Type Death  Referral From Nurse  Consult/Referral To Chaplain  Spiritual Encounters  Spiritual Needs Prayer;Grief support

## 2017-02-17 NOTE — Progress Notes (Signed)
Called Organ donation services reference number [phone]. Spoke with Dionne and she stated to call back with time of death.

## 2017-02-17 NOTE — Death Summary Note (Signed)
DEATH SUMMARY   Patient Details  Name: ALBIE BAZIN MRN: [id number] DOB: 1930/08/27  Admission/Discharge Information   Admit Date:  02/06/17  Date of Death:    Time of Death:    Length of Stay: 4  Referring Physician: Valerie Roys, DO   Reason(s) for Hospitalization  Shortness of breath, Hypoxia. Respiratory Failure.   Diagnoses  Preliminary cause of death:  Secondary Diagnoses (including complications and co-morbidities):  Active Problems:   Sepsis ([place])   Acute respiratory failure with hypoxia [facility])   Advance care planning   Goals of care, counseling/discussion   Palliative care by specialist   HCAP (healthcare-associated pneumonia)   Brief Hospital Course (including significant findings, care, treatment, and services provided and events leading to death)  STEFHANIE KACHMAR is a 83 y.o. year old female who Past medical history of atrial fibrillation, anxiety, dementia, depression, diabetes, hypertension, history of diastolic CHF, COPD, hypothyroidism, peripheral asked disease who presented to the hospital due to shortness of breath and respiratory distress.  1. Sepsis 2. Acute respiratory failure with hypoxia 3. Shock 4. Atrial fibrillation with rapid ventricular response 5. CHF 6. Acute kidney injury 7. History of dementia 8. Hypothyroidism-continue Synthroid. 9. COPD-no acute exacerbation. 10. GERD-continue Protonix. 11. Neuropathy-continue gabapentin. Lowered dose due to Myoclonic Jerking   Despite aggressive care with IV fluids, vasopressors, IV antibiotics patient's condition was not improving. [name] and other family members do not want to pursue aggressive care. Patient already is a DO NOT RESUSCITATE. Patient was placed on comfort measures only and extubated. Patient shortly thereafter passed away on the late afternoon off 12/25/2016. Family was made aware.    Pertinent Labs and Studies  Significant Diagnostic Studies US Venous Img  Lower Unilateral Right  Result Date: 01/16/2017 CLINICAL DATA:  Right lower extremity swelling and redness for 1 week. EXAM: RIGHT LOWER EXTREMITY VENOUS DOPPLER ULTRASOUND TECHNIQUE: Gray-scale sonography with graded compression, as well as color Doppler and duplex ultrasound were performed to evaluate the lower extremity deep venous systems from the level of the common femoral vein and including the common femoral, femoral, profunda femoral, popliteal and calf veins including the posterior tibial, peroneal and gastrocnemius veins when visible. The superficial great saphenous vein was also interrogated. Spectral Doppler was utilized to evaluate flow at rest and with distal augmentation maneuvers in the common femoral, femoral and popliteal veins. COMPARISON:  None. FINDINGS: Contralateral Common Femoral Vein: Respiratory phasicity is normal and symmetric with the symptomatic side. No evidence of thrombus. Normal compressibility. Common Femoral Vein: No evidence of thrombus. Normal compressibility, respiratory phasicity and response to augmentation. Saphenofemoral Junction: No evidence of thrombus. Normal compressibility and flow on color Doppler imaging. Profunda Femoral Vein: No evidence of thrombus. Normal compressibility and flow on color Doppler imaging. Femoral Vein: No evidence of thrombus. Normal compressibility, respiratory phasicity and response to augmentation. Popliteal Vein: No evidence of thrombus. Normal compressibility, respiratory phasicity and response to augmentation. Calf Veins: No evidence of thrombus. Normal compressibility and flow on color Doppler imaging. Peroneal vein not visualized. Superficial Great Saphenous Vein: No evidence of thrombus. Normal compressibility and flow on color Doppler imaging. Venous Reflux:  None. Other Findings:  None. IMPRESSION: No evidence of DVT within the right lower extremity. Electronically Signed   By: Lajean Manes M.D.   On: 01/16/2017 12:21   US Venous  Img Upper Uni Left  Result Date: 01/23/2017 CLINICAL DATA:  Left upper extremity edema. EXAM: LEFT UPPER EXTREMITY VENOUS DOPPLER ULTRASOUND TECHNIQUE: Gray-scale sonography with  graded compression, as well as color Doppler and duplex ultrasound were performed to evaluate the upper extremity deep venous system from the level of the subclavian vein and including the jugular, axillary, basilic, radial, ulnar and upper cephalic vein. Spectral Doppler was utilized to evaluate flow at rest and with distal augmentation maneuvers. COMPARISON:  None. FINDINGS: Contralateral Subclavian Vein: Respiratory phasicity is normal and symmetric with the symptomatic side. No evidence of thrombus. Normal compressibility. Internal Jugular Vein: No evidence of thrombus. Normal compressibility, respiratory phasicity and response to augmentation. Subclavian Vein: No evidence of thrombus. Normal compressibility, respiratory phasicity and response to augmentation. Axillary Vein: No evidence of thrombus. Normal compressibility, respiratory phasicity and response to augmentation. Cephalic Vein: No evidence of thrombus. Normal compressibility, respiratory phasicity and response to augmentation. Basilic Vein: Not evaluated due to overlying bandaging material. Brachial Veins: Occlusive thrombus is demonstrated within 1 of the paired brachial veins. Radial Veins: Not evaluated due to overlying bandaging material. Ulnar Veins: Not evaluated due to overlying bandaging material. Venous Reflux:  None visualized. Other Findings:  None visualized. IMPRESSION: Thrombus is demonstrated within 1 of the paired brachial veins. The basilic, radial and ulnar veins are not able to be evaluated secondary to overlying bandaging material. These results will be called to the ordering clinician or representative by the Radiologist Assistant, and communication documented in the PACS or zVision Dashboard. Electronically Signed   By: Lovey Newcomer M.D.   On: 01/23/2017  16:20   Dg Chest Port 1 View  Result Date: 02/03/2017 CLINICAL DATA:  Respiratory failure. EXAM: PORTABLE CHEST 1 VIEW COMPARISON:  January 23, 2017 FINDINGS: The ETT is in good position. The right PICC line is stable. Mild edema persists. New infiltrate in the right lung base. No other interval changes. IMPRESSION: 1. New right basilar infiltrate worrisome for pneumonia or aspiration. 2. Persistent mild edema. 3. Stable support apparatus. Electronically Signed   By: Dorise Bullion III M.D   On: 2017/02/03 06:50   Dg Chest Port 1 View  Result Date: 01/23/2017 CLINICAL DATA:  Initial evaluation for acute respiratory failure. EXAM: PORTABLE CHEST 1 VIEW COMPARISON:  Prior radiograph from 01/22/2017. FINDINGS: Patient remains intubated with the tip of an endotracheal tube positioned 3.2 cm above the carina. Tip and side hole of an enteric tube overlies the stomach. Right-sided PICC catheter tip overlies the cavoatrial junction. Stable cardiomegaly. Mediastinal silhouette normal. Aortic atherosclerosis. Lungs mildly hypoinflated. Mild diffuse pulmonary edema, improved from previous. Small bilateral pleural effusions, left greater than right, also likely improved. Dense retrocardiac left lower lobe opacity likely atelectasis and/ or edema or effusion. No other focal infiltrates. No pneumothorax. Osseous structures unchanged. IMPRESSION: 1. Support apparatus in satisfactory position as above. 2. Stable cardiomegaly with persistent but improved pulmonary edema as compared to 01/22/17. Persistent small bilateral pleural effusions, also improved. 3. Dense left lower lobe retrocardiac opacity, likely atelectasis and/or edema or effusion. Electronically Signed   By: Jeannine Boga M.D.   On: 01/23/2017 06:14   Dg Chest Port 1 View  Result Date: 01/22/2017 CLINICAL DATA:  Initial evaluation for hypoxia, history of AFib, COPD. EXAM: PORTABLE CHEST 1 VIEW COMPARISON:  Prior radiograph from earlier the same day.  FINDINGS: Endotracheal tube in place with tip position approximately 4.1 cm above the carina. Tip and side hole of enteric tube overlies the stomach. Right PICC catheter in place with tip overlying the distal SVC, unchanged. Stable cardiomegaly. Mediastinal silhouette unchanged. Aortic atherosclerosis. Lungs are hypoinflated. Diffuse pulmonary vascular congestion with interstitial prominence  and bilateral airspace opacities, most consistent with diffuse pulmonary edema, progressed relative to previous. Probable left pleural effusion. Associated left basilar opacity likely reflects atelectasis/ edema. Superimposed infiltrate would be difficult to exclude. No pneumothorax. Osseous structures unchanged. IMPRESSION: 1. Support apparatus in satisfactory position as above. 2. Cardiomegaly with widespread diffuse pulmonary edema, consistent with congestive heart failure. Changes have worsened relative to prior radiograph performed earlier the same day. 3. Probable left pleural effusion. 4. Superimposed left basilar opacity, likely atelectasis/edema. Superimposed infiltrate would be difficult to exclude and could be considered in the correct clinical setting. Electronically Signed   By: Jeannine Boga M.D.   On: 01/22/2017 21:23   Dg Chest Port 1 View  Result Date: 01/22/2017 CLINICAL DATA:  Respiratory failure EXAM: PORTABLE CHEST 1 VIEW COMPARISON:  January 21, 2017 FINDINGS: Endogastric tube is identified in good position and distal tip 3.7 cm from carina. Nasogastric tube is identified distal tip not included on the film but is at least in the stomach. Right jugular central venous line identified distal tip in the superior vena cava. The heart size and mediastinal contours are stable. The heart size is enlarged. There is pulmonary edema. IMPRESSION: Congestive heart failure. Electronically Signed   By: Abelardo Diesel M.D.   On: 01/22/2017 07:12   Dg Chest Port 1 View  Result Date: 01/21/2017 CLINICAL DATA:   Respiratory failure. EXAM: PORTABLE CHEST 1 VIEW COMPARISON:  02/11/2017. FINDINGS: Endotracheal tube noted with its tip at the carina. Proximal repositioning of approximately 2-3 cm suggested. NG tube noted with tip below left hemidiaphragm. Right PICC line noted with tip at cavoatrial junction. Cardiomegaly with improvement of bilateral upper lobe infiltrates/edema. Progressive bilateral lower lobe infiltrates/ edema noted. Basilar atelectasis. Small bilateral pleural effusions. IMPRESSION: 1. Endotracheal tube tip noted at the level of the carina. Proximal repositioning of approximately 2-3 cm suggested. NG tube and right PICC line stable position. 2. Cardiomegaly with improvement of bilateral upper lobe infiltrates/edema. Progressive bilateral lower lobe infiltrates/edema noted. Small bilateral pleural effusions. Critical Value/emergent results were called by telephone at the time of interpretation on 01/21/2017 at 6:26 am to nurse Tess,who verbally acknowledged these results. Electronically Signed   By: Marcello Moores  Register   On: 01/21/2017 06:28   Dg Chest Portable 1 View  Result Date: 02/04/2017 CLINICAL DATA:  Post intubation, unresponsive EXAM: PORTABLE CHEST 1 VIEW COMPARISON:  01/18/2017 FINDINGS: Right PICC line tip is at the cavoatrial junction. Endotracheal tube is 3 cm above the carina. NG tube enters the stomach. Heart is normal size. Bilateral airspace opacities are again noted, most pronounced in the upper lobes. This has worsened on the left since prior study. This could represent edema or infection. IMPRESSION: Bilateral airspace opacities, worsening in the left upper lobe since prior study. This could reflect worsening edema or infection. Electronically Signed   By: Rolm Baptise M.D.   On: 01/25/2017 07:24   Dg Chest Port 1 View  Result Date: 01/18/2017 CLINICAL DATA:  Congestive heart failure EXAM: PORTABLE CHEST 1 VIEW COMPARISON:  January 17, 2017 FINDINGS: There is persistent patchy interstitial  and alveolar opacity, likely edema. There is cardiomegaly with pulmonary venous hypertension. There is aortic atherosclerosis. No adenopathy. No bone lesions. IMPRESSION: Persistent patchy interstitial and alveolar opacities, likely due to edema, although superimposed pneumonia in the right upper lobe cannot be excluded. Stable cardiomegaly. Overall no change from 1 day prior. There is aortic atherosclerosis. Electronically Signed   By: Lowella Grip III M.D.   On: 01/18/2017  07:14   Dg Chest Port 1 View  Result Date: 01/17/2017 CLINICAL DATA:  Worsening shortness breath.  Pneumonia.  Sepsis. EXAM: PORTABLE CHEST 1 VIEW COMPARISON:  01/16/2017 FINDINGS: The heart size and mediastinal contours are within normal limits. Mixed interstitial and airspace disease is again seen bilaterally, showing mild interval worsening in the right upper and lower lung fields. No evidence of pneumothorax or pleural effusion. IMPRESSION: Diffuse mixed interstitial and airspace disease, with mild interval worsening in the right lung. Electronically Signed   By: Earle Gell M.D.   On: 01/17/2017 08:46   Dg Chest Port 1 View  Result Date: 01/16/2017 CLINICAL DATA:  83 year old female recently admitted with pneumonia and sepsis, acute respiratory failure today EXAM: PORTABLE CHEST 1 VIEW COMPARISON:  Prior chest x-ray 01/15/2017 FINDINGS: Cardiomegaly is unchanged. Atherosclerotic calcifications again noted in the transverse aorta. Diffuse interstitial prominence bilaterally, slightly improved compared to prior. Areas of patchy interstitial and airspace opacity are present within the right upper and left mid lung. Overall, inspiratory volumes and aeration are improved. IMPRESSION: 1. Overall, inspiratory volumes and pulmonary aeration are improved compared to yesterday likely secondary to decreased interstitial edema. 2. Patchy airspace opacities in the right upper and left mid lung may reflect multifocal pneumonia. 3.  Aortic  Atherosclerosis (ICD10-170.0) 4. Background of chronic bronchitic change and interstitial prominence. Electronically Signed   By: Jacqulynn Cadet M.D.   On: 01/16/2017 10:14   Dg Chest Portable 1 View  Result Date: 01/15/2017 CLINICAL DATA:  Fever and increasing shortness of breath.  Hypoxia. EXAM: PORTABLE CHEST 1 VIEW COMPARISON:  07/22/2016, 05/18/2016, 08/29/2015 and 05/16/2015 as well as chest CT dated 07/22/2015 FINDINGS: The patient has diffuse infiltrates throughout the right lung inter lesser degree in the left lung base. Heart size is normal. Slight pulmonary vascular prominence. No discrete effusions. Extensive calcification in the thoracic aorta. IMPRESSION: Extensive bilateral pulmonary infiltrates, right worse than left. Aortic atherosclerosis. Electronically Signed   By: Lorriane Shire M.D.   On: 01/15/2017 15:03   Dg Abd Portable 1 View  Result Date: 02/04/2017 CLINICAL DATA:  OG tube placement EXAM: PORTABLE ABDOMEN - 1 VIEW COMPARISON:  10/11/2014 FINDINGS: NG tube tip is in the proximal stomach. Nonobstructive bowel gas pattern. Moderate to large stool burden throughout the colon. No free air. IMPRESSION: NG tube tip in the proximal stomach. Moderate to large stool burden. Electronically Signed   By: Rolm Baptise M.D.   On: 02/08/2017 07:25    Microbiology Recent Results (from the past 240 hour(s))  CULTURE, BLOOD (ROUTINE X 2) w Reflex to ID Panel     Status: None   Collection Time: 01/22/17  7:02 PM  Result Value Ref Range Status   Specimen Description BLOOD RIGHT HAND  Final   Special Requests   Final    BOTTLES DRAWN AEROBIC AND ANAEROBIC Blood Culture adequate volume   Culture NO GROWTH 5 DAYS  Final   Report Status 01/27/2017 FINAL  Final  CULTURE, BLOOD (ROUTINE X 2) w Reflex to ID Panel     Status: None   Collection Time: 01/22/17  7:56 PM  Result Value Ref Range Status   Specimen Description BLOOD A-LINE DRAW  Final   Special Requests   Final    BOTTLES DRAWN  AEROBIC AND ANAEROBIC Blood Culture results may not be optimal due to an excessive volume of blood received in culture bottles   Culture NO GROWTH 5 DAYS  Final   Report Status 01/27/2017 FINAL  Final  Culture, respiratory (NON-Expectorated)     Status: None   Collection Time: 01/22/17 10:56 PM  Result Value Ref Range Status   Specimen Description INDUCED SPUTUM  Final   Special Requests NONE  Final   Gram Stain   Final    MODERATE WBC PRESENT,BOTH PMN AND MONONUCLEAR RARE SQUAMOUS EPITHELIAL CELLS PRESENT NO ORGANISMS SEEN Performed at [facility], [address], [place], [place] [address]    Culture RARE CANDIDA ALBICANS  Final   Report Status 01/25/2017 FINAL  Final    Lab Basic Metabolic Panel: No results for input(s): NA, K, CL, CO2, GLUCOSE, BUN, CREATININE, CALCIUM, MG, PHOS in the last 168 hours. Liver Function Tests: No results for input(s): AST, ALT, ALKPHOS, BILITOT, PROT, ALBUMIN in the last 168 hours. No results for input(s): LIPASE, AMYLASE in the last 168 hours. No results for input(s): AMMONIA in the last 168 hours. CBC: No results for input(s): WBC, NEUTROABS, HGB, HCT, MCV, PLT in the last 168 hours. Cardiac Enzymes: No results for input(s): CKTOTAL, CKMB, CKMBINDEX, TROPONINI in the last 168 hours. Sepsis Labs: No results for input(s): PROCALCITON, WBC, LATICACIDVEN in the last 168 hours.  Procedures/Operations  None   SAINANI,VIVEK J 02/01/2017, 4:15 PM

## 2017-02-17 NOTE — Progress Notes (Signed)
Patient extubated to room air for comfort care per Dr. Jefferson Fuel orders.

## 2017-02-17 NOTE — Progress Notes (Signed)
ANTICOAGULATION CONSULT NOTE - Initial Consult  Pharmacy Consult for heparin Indication: DVT  No Known Allergies  Patient Measurements: Height: 5\' 7"  (170.2 cm) Weight: 208 lb 8.9 oz (94.6 kg) IBW/kg (Calculated) : 61.6 Heparin Dosing Weight: 82.3 kg  Vital Signs: Temp: 99 F (37.2 C) (07/08 0600) BP: 140/84 (07/08 0600) Pulse Rate: 135 (07/08 0600)  Labs:  Recent Labs  01/21/17 1416  01/22/17 0520 01/23/17 0520 01/23/17 1708 20-Feb-2017 0311 Feb 20, 2017 0558  HGB  --   < > 7.2* 8.2* 7.8* 8.4*  --   HCT  --   < > 23.3* 25.1* 24.6* 26.9*  --   PLT  --   < > 470* 577* 538* 571*  --   APTT  --   --   --   --  35  --   --   LABPROT  --   --   --   --  14.8  --  14.4  INR  --   --   --   --  1.15  --  1.11  HEPARINUNFRC  --   --   --   --   --   --  0.38  CREATININE  --   --  0.99 0.97  --  0.80  --   TROPONINI <0.03  --  <0.03  --   --   --   --   < > = values in this interval not displayed.  Estimated Creatinine Clearance: 61.8 mL/min (by C-G formula based on SCr of 0.8 mg/dL).   Medical History: Past Medical History:  Diagnosis Date  . Adenomatous polyps   . Age-related osteoporosis without current pathological fracture   . Anemia   . Anxiety   . Atherosclerosis of aorta ([place])   . Atrial fibrillation with RVR ([place])   . Barrett's esophagus without dysplasia   . CHF (congestive heart failure) ([place])   . Colitis   . COPD (chronic obstructive pulmonary disease) ([place])   . Dementia   . Depression   . Diabetes mellitus type II, uncontrolled ([place])   . Diverticulitis   . Diverticulosis of intestine without perforation or abscess without bleeding   . Edema   . Essential hemorrhagic thrombocythemia ([place])   . Gastrointestinal hemorrhage   . High anion gap metabolic acidosis   . Hyperlipidemia   . Hypertension   . Hypertensive chronic kidney disease with stage 1 through stage 4 chronic kidney disease, or unspecified chronic kidney disease   . Hypothyroidism   .  Hypoxemia   . Iron deficiency anemia   . Lactic acidosis   . Left bundle branch block   . Nonrheumatic mitral valve insufficiency   . Osteoarthritis   . Other psychoactive substance dependence, in remission ([place])   . Other specified symptoms and signs involving the circulatory and respiratory systems   . Paroxysmal atrial fibrillation ([facility])    s/p cardioversion  . Personal history of peptic ulcer disease   . Pulmonary edema   . PVD (peripheral vascular disease) ([place]) 05/14/2015  . Recurrent pneumonia    history of  . Respiratory failure with hypoxia and hypercapnia ([place])   . Type 2 diabetes mellitus with diabetic chronic kidney disease ([place])   . Type 2 diabetes mellitus with diabetic polyneuropathy ([place])   . Vascular dementia without behavioral disturbance   . Vascular disorder of intestine ([facility])     Medications:  Infusions:  . amiodarone 30 mg/hr (01/23/17 2008)  . ceFEPIme (MAXIPIME) 2 GM IVP  Stopped (01/23/17 2036)  . feeding supplement (VITAL HIGH PROTEIN) 1,000 mL (01/23/17 1900)  . fentaNYL infusion INTRAVENOUS 20 mcg/hr (02-19-2017 0100)  . heparin 1,300 Units/hr (01/23/17 2138)  . norepinephrine (LEVOPHED) Adult infusion Stopped (2017-02-19 3338)  . vancomycin      Assessment: 83 yof with brachial vein thrombus on [place]. Pharmacy consulted to start UFH for DVT.  Goal of Therapy:  Heparin level 0.3-0.7 units/ml Monitor platelets by anticoagulation protocol: Yes   Plan:  Give 4000 units bolus x 1 Start heparin infusion at 1300 units/hr Check anti-Xa level in 8 hours and daily while on heparin Continue to monitor H&H and platelets  7/8 @ 0558 HL 0.38 therapeutic. Will continue current rate and will recheck HL at 1400.  Tobie Lords, Pharm.D., BCPS Clinical Pharmacist 02/19/2017,6:52 AM

## 2017-02-17 DEATH — deceased

## 2017-03-03 ENCOUNTER — Other Ambulatory Visit: Payer: Self-pay | Admitting: Nurse Practitioner

## 2017-03-09 ENCOUNTER — Ambulatory Visit: Payer: Medicare Other | Admitting: Family Medicine

## 2017-04-29 ENCOUNTER — Ambulatory Visit: Payer: Medicare Other | Admitting: Family

## 2017-12-29 IMAGING — CR DG HIP (WITH PELVIS) OPERATIVE*L*
7 of 9 series · 7 of 9 positions shown · non-contrast
Comparison: Pelvic radiograph 06/17/2016

CLINICAL DATA: Intra medullary rod placement left femur

EXAM:
OPERATIVE left HIP (WITH PELVIS IF PERFORMED) 9 VIEWS
TECHNIQUE: Fluoroscopic spot image(s) were submitted for interpretation
post-operatively.

[cont. (1 of 7)]
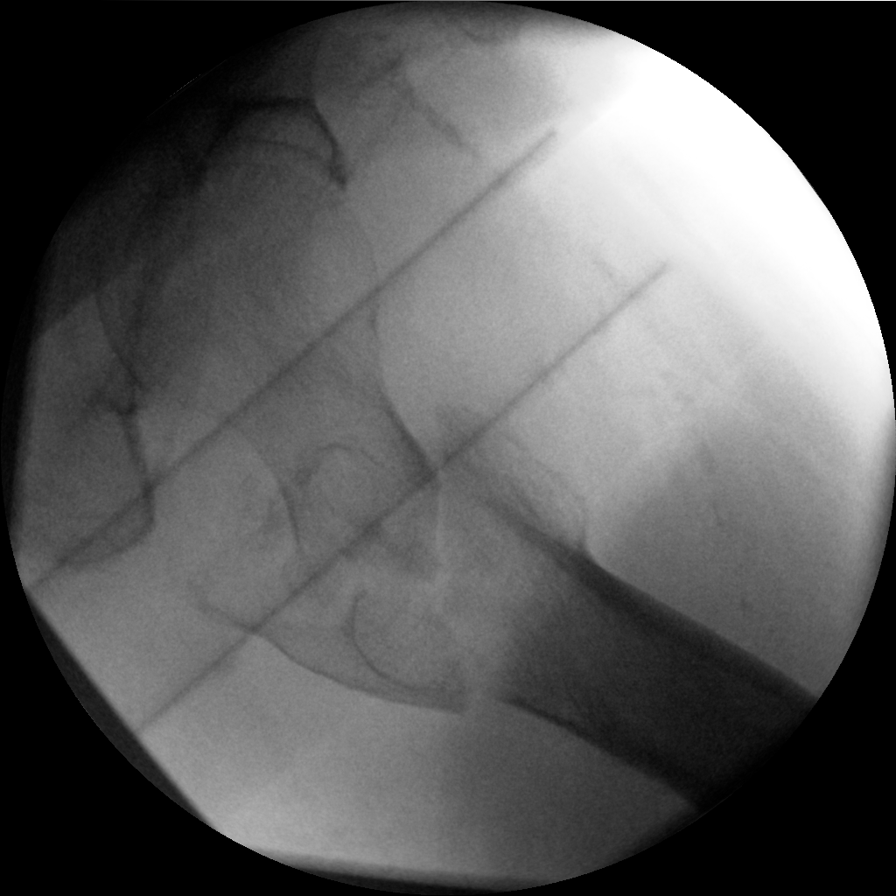

[cont. (2 of 7)]
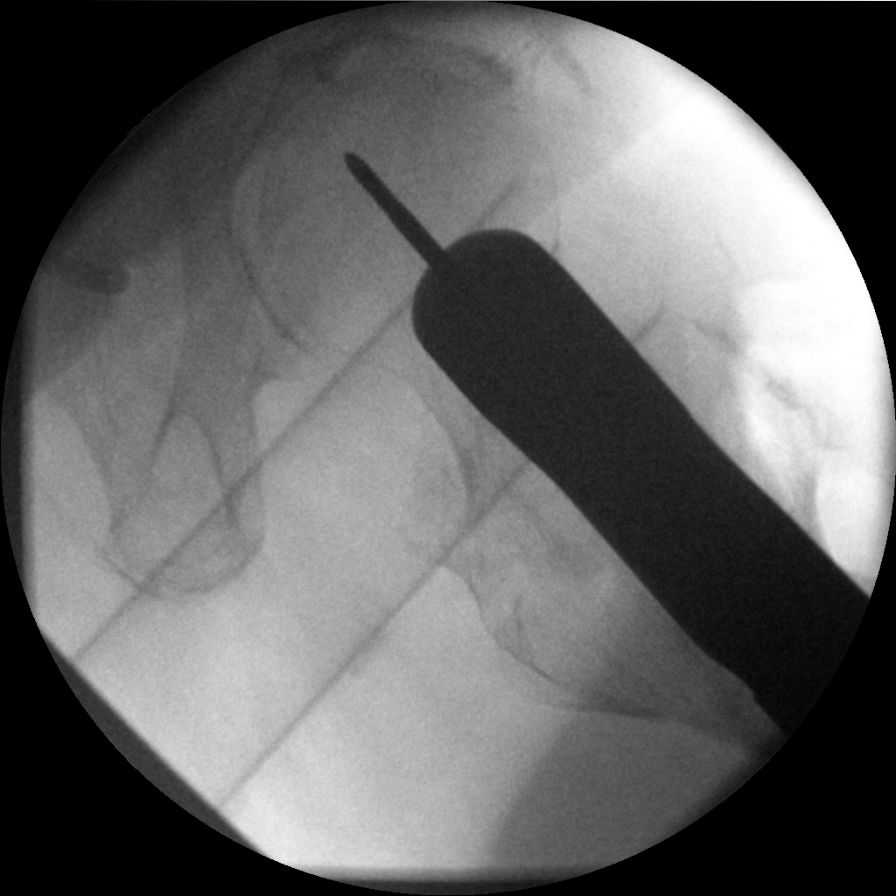

[cont. (3 of 7)]
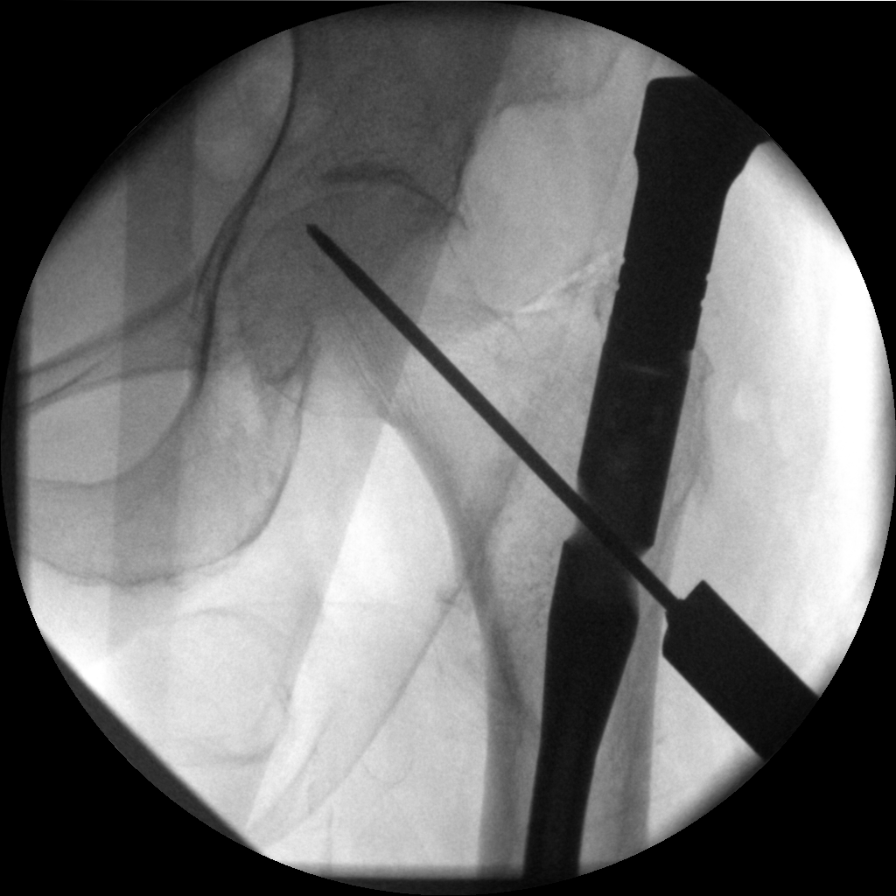

[cont. (4 of 7)]
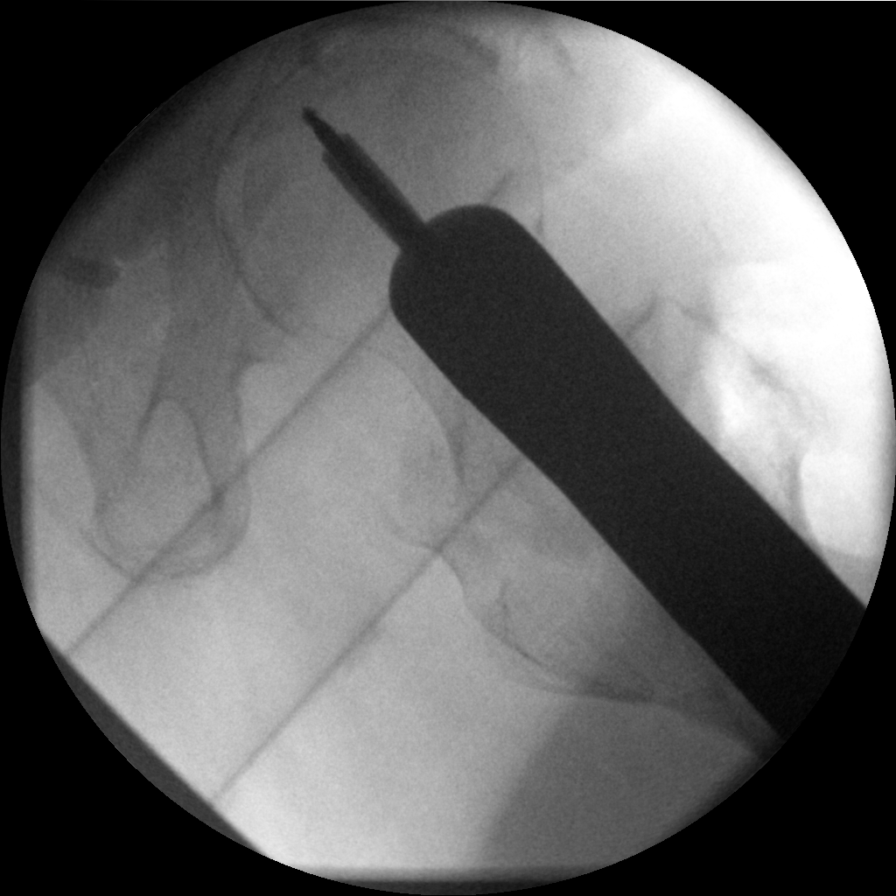

[cont. (5 of 7)]
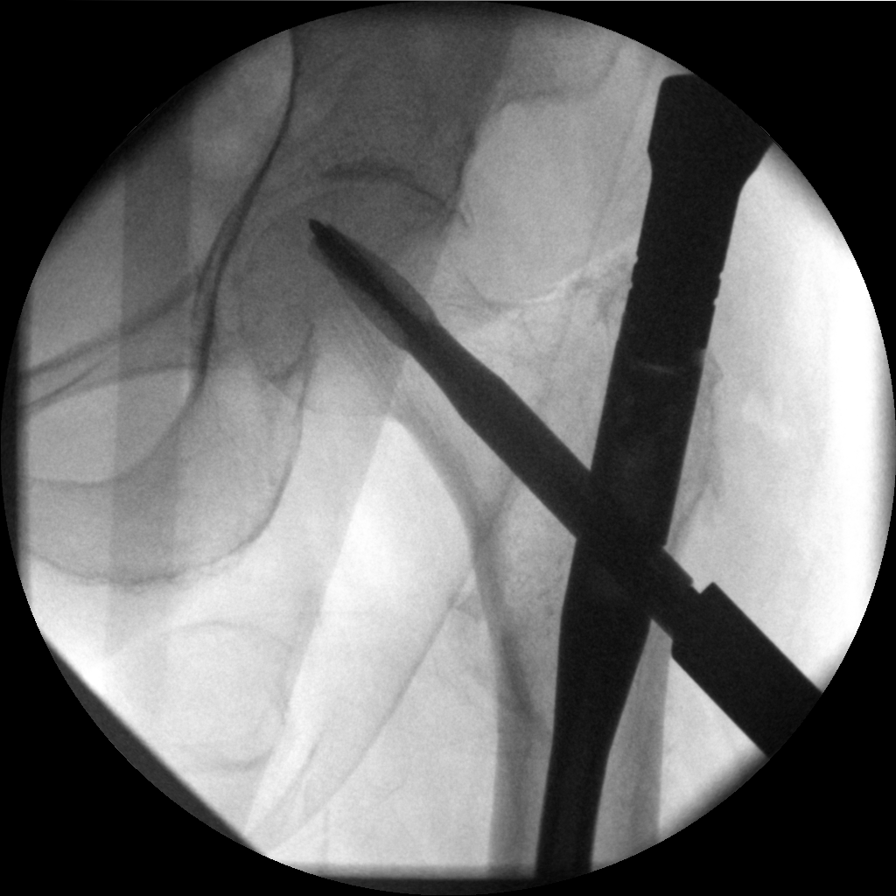

[cont. (6 of 7)]
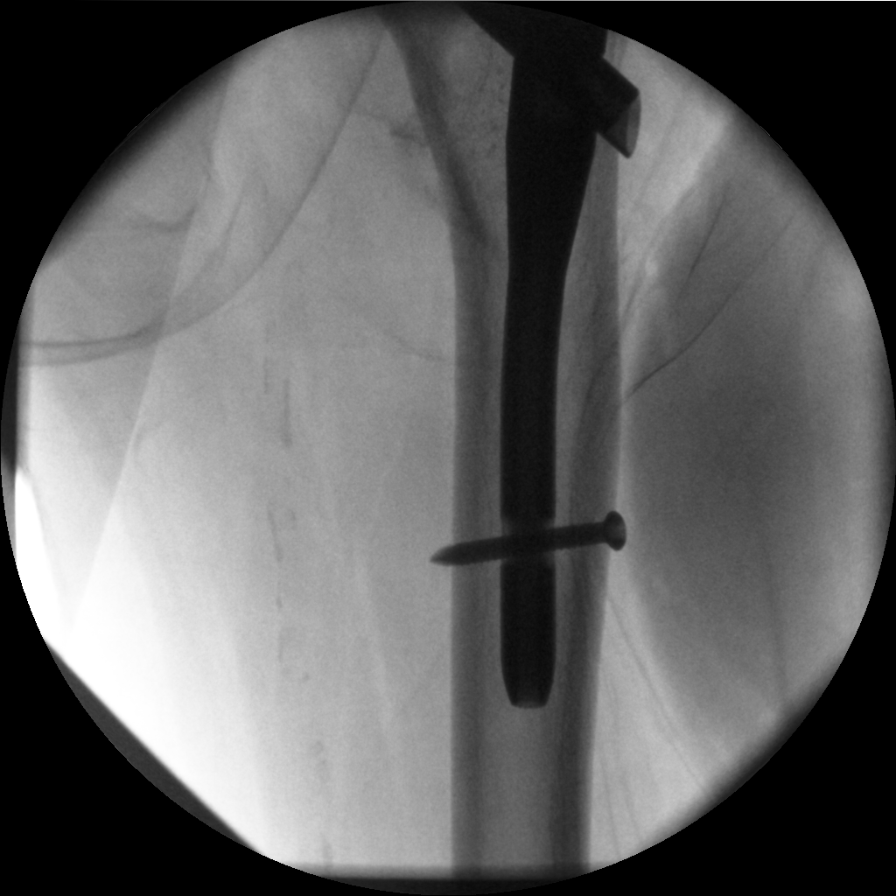

[cont. (7 of 7)]
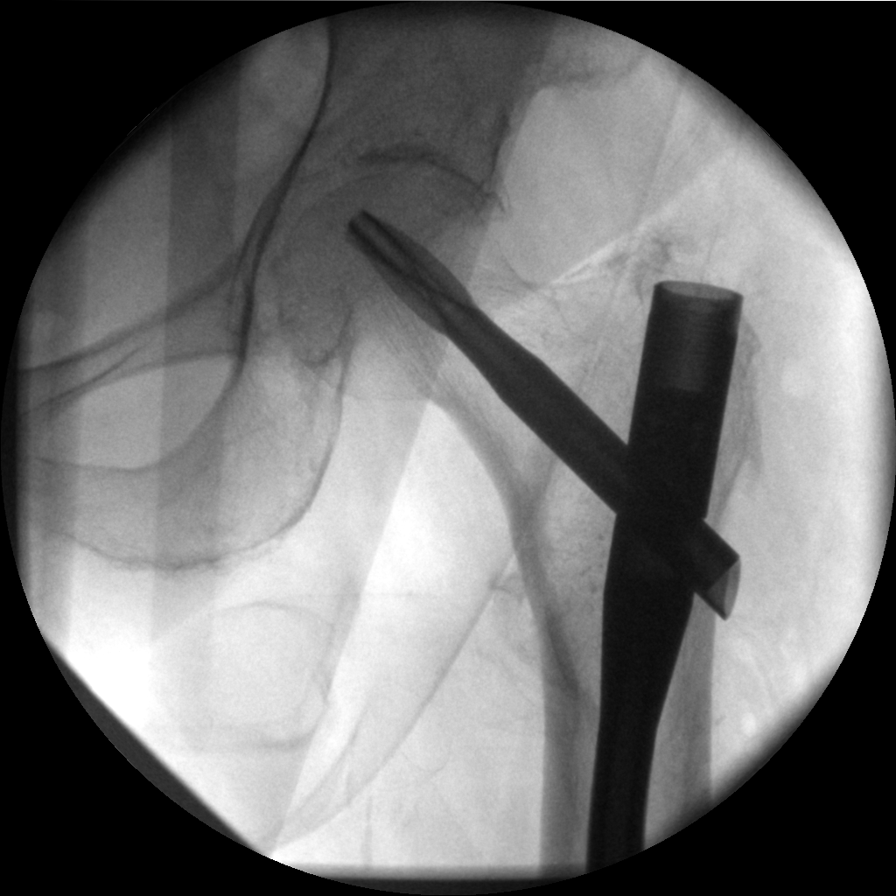

[7 of 9 positions shown; findings below may reference images not displayed]

FINDINGS: Fluoroscopic images were obtained during the placement of an
antegrade intra medullary rod with interlocking screw in the left
femur, traversing an intertrochanteric fracture. Fluoroscopy time
was reported as 64 seconds.
IMPRESSION: Intraoperative images obtained during left femoral internal
fixation.

## 2018-07-28 IMAGING — DX DG CHEST 1V PORT
1 series · 1 of 1 positions shown · non-contrast
Comparison: 07/22/2016, 05/18/2016, 08/29/2015 and 05/16/2015 as
well as chest CT dated 07/22/2015

CLINICAL DATA: Fever and increasing shortness of breath.  Hypoxia.

EXAM:
PORTABLE CHEST 1 VIEW

[chest ap]
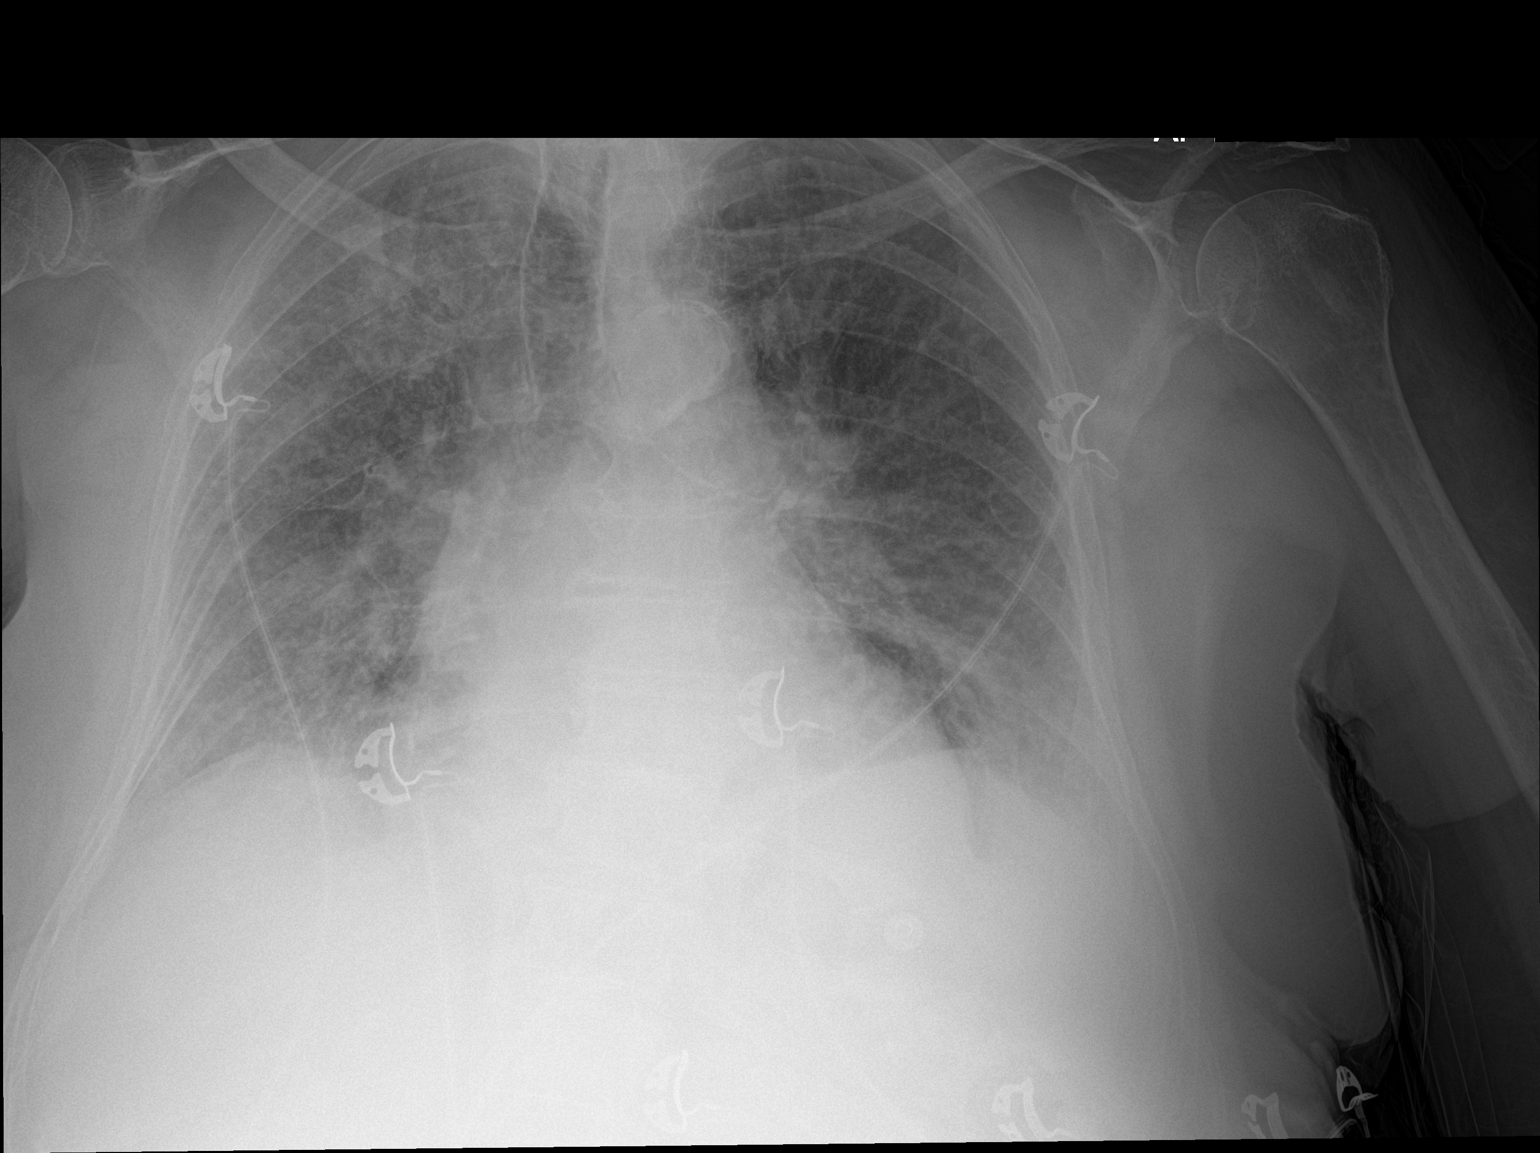

[1 of 1 positions shown; findings below may reference images not displayed]

FINDINGS: The patient has diffuse infiltrates throughout the right lung inter
lesser degree in the left lung base. Heart size is normal. Slight
pulmonary vascular prominence. No discrete effusions. Extensive
calcification in the thoracic aorta.
IMPRESSION: Extensive bilateral pulmonary infiltrates, right worse than left.

Aortic atherosclerosis.

## 2018-07-30 IMAGING — DX DG CHEST 1V PORT
1 series · 1 of 1 positions shown · non-contrast
Comparison: 01/16/2017

CLINICAL DATA: Worsening shortness breath.  Pneumonia.  Sepsis.

EXAM:
PORTABLE CHEST 1 VIEW

[chest ap]
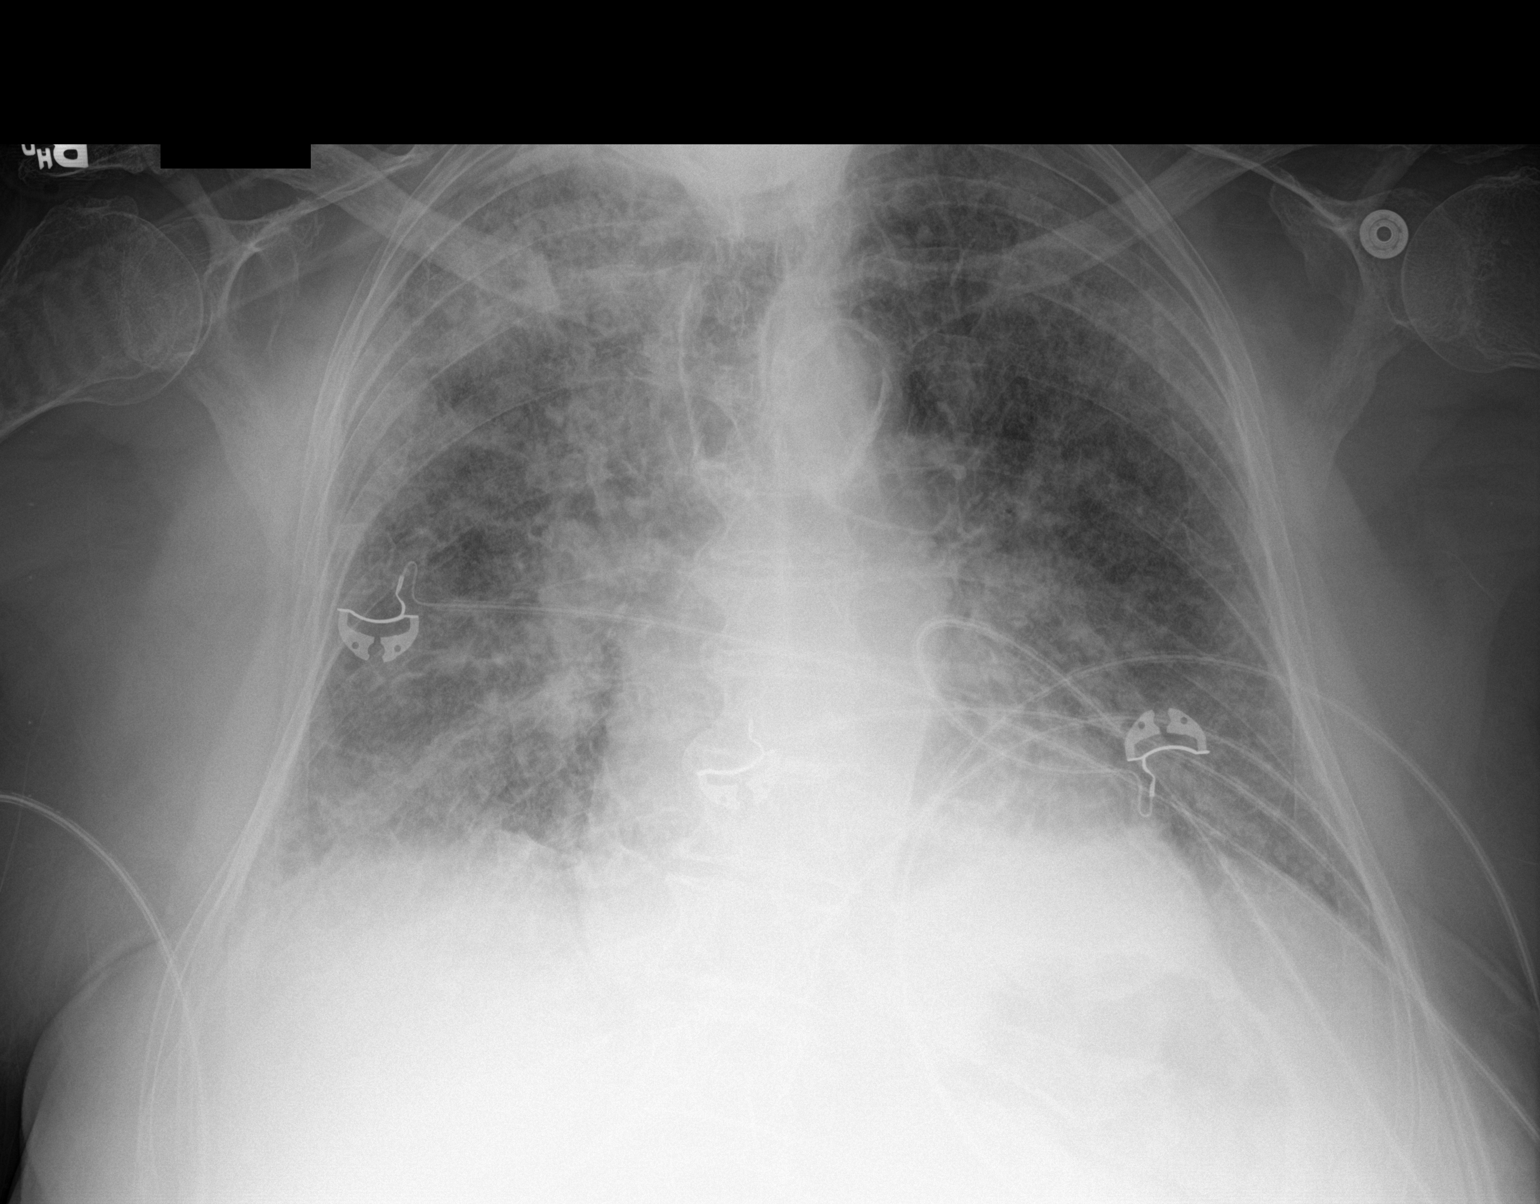

[1 of 1 positions shown; findings below may reference images not displayed]

FINDINGS: The heart size and mediastinal contours are within normal limits.
Mixed interstitial and airspace disease is again seen bilaterally,
showing mild interval worsening in the right upper and lower lung
fields. No evidence of pneumothorax or pleural effusion.
IMPRESSION: Diffuse mixed interstitial and airspace disease, with mild interval
worsening in the right lung.

## 2018-08-02 IMAGING — DX DG ABD PORTABLE 1V
1 series · 1 of 1 positions shown · non-contrast
Comparison: 10/11/2014

CLINICAL DATA: OG tube placement

EXAM:
PORTABLE ABDOMEN - 1 VIEW

[abdomen kub]
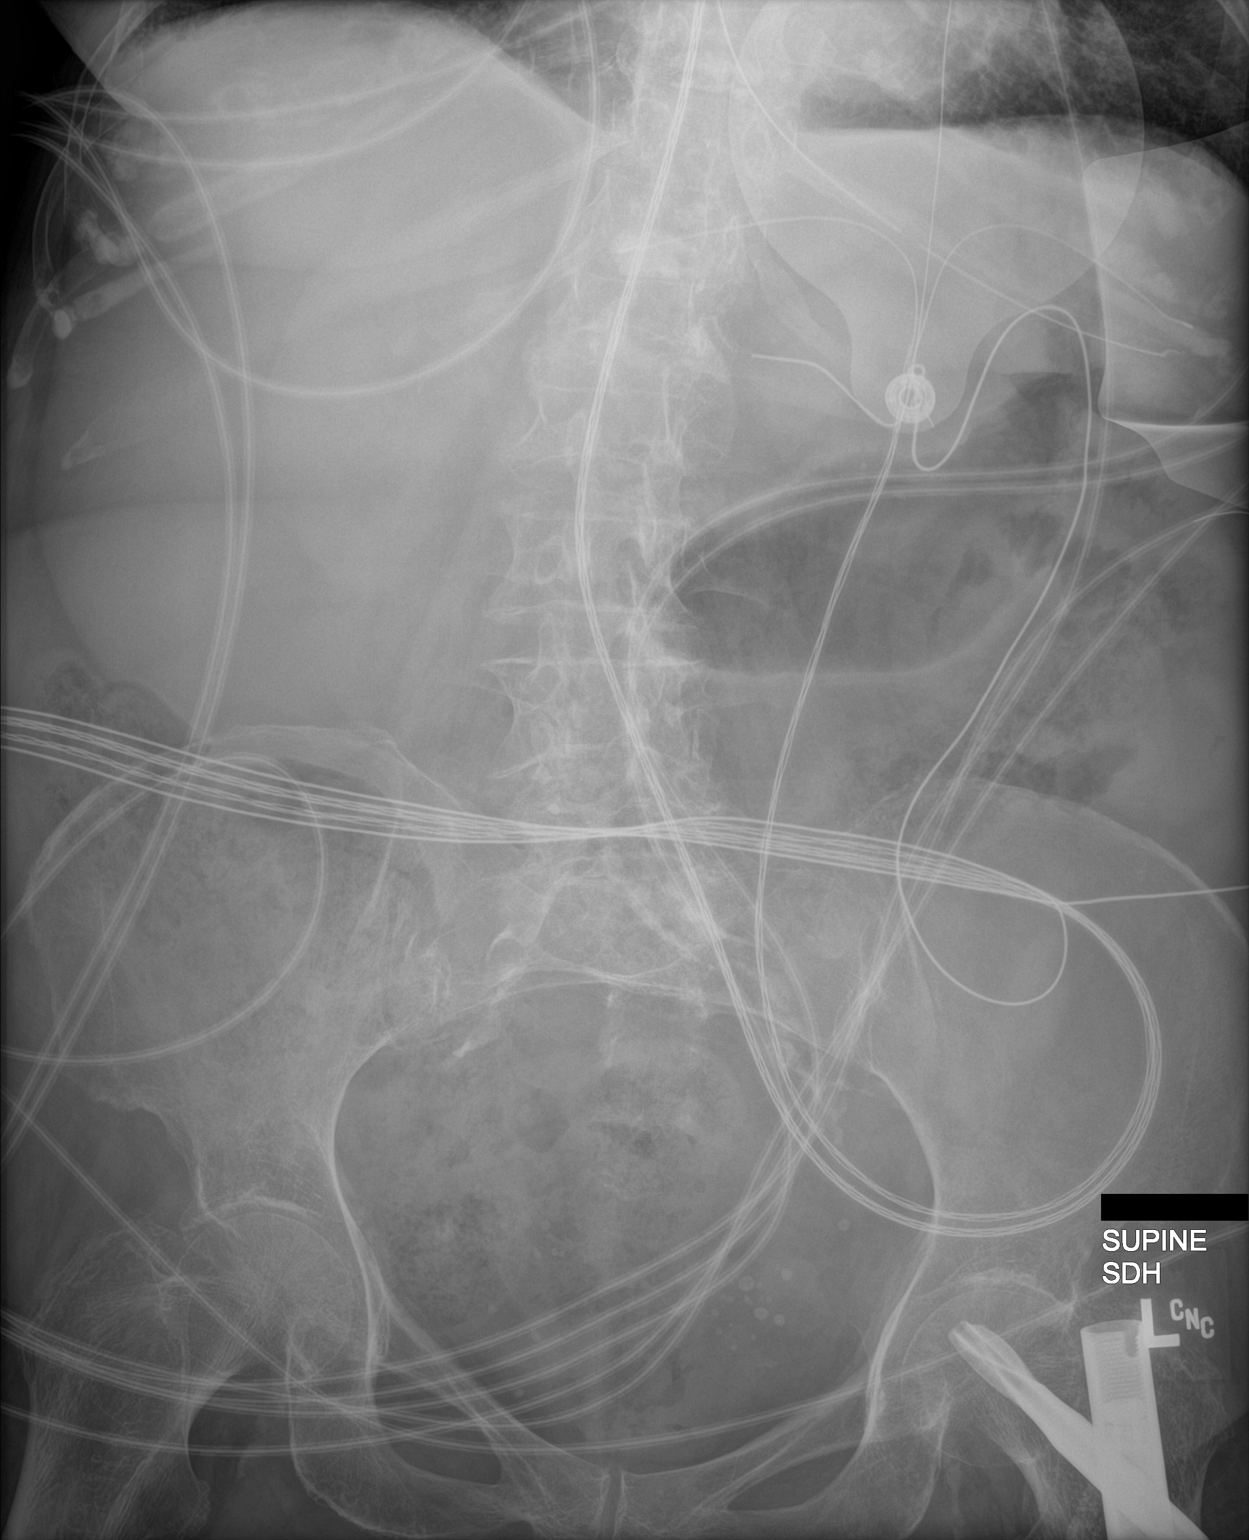

[1 of 1 positions shown; findings below may reference images not displayed]

FINDINGS: NG tube tip is in the proximal stomach. Nonobstructive bowel gas
pattern. Moderate to large stool burden throughout the colon. No
free air.
IMPRESSION: NG tube tip in the proximal stomach.

Moderate to large stool burden.

## 2018-08-09 IMAGING — US US EXTREM LOW VENOUS*R*
1 series · 13 of 24 positions shown · non-contrast
Comparison: None.

CLINICAL DATA: Right lower extremity swelling and redness for 1
week.



[Series 1: us extrem low venous*right* · 0.09mm/px · 13 of 37 slices shown]
[im 1/37]
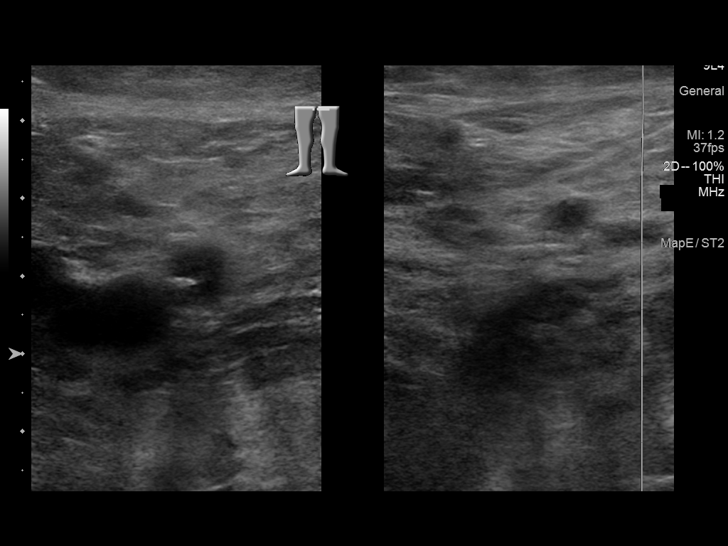
[im 4/37]
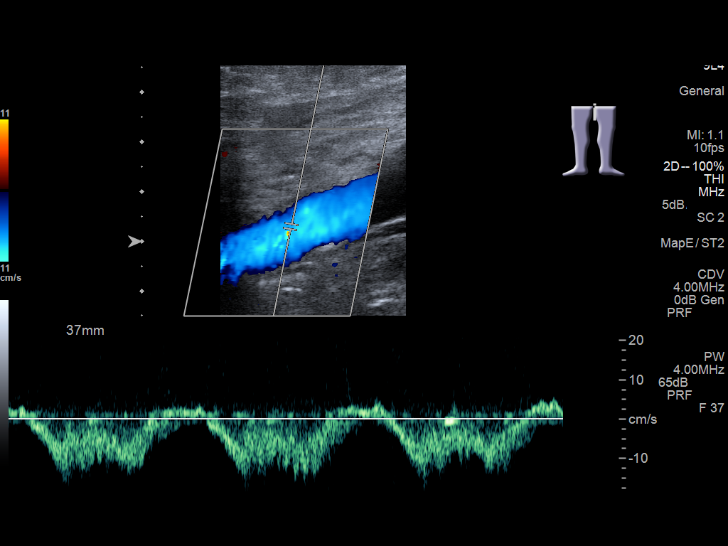
[im 7/37]
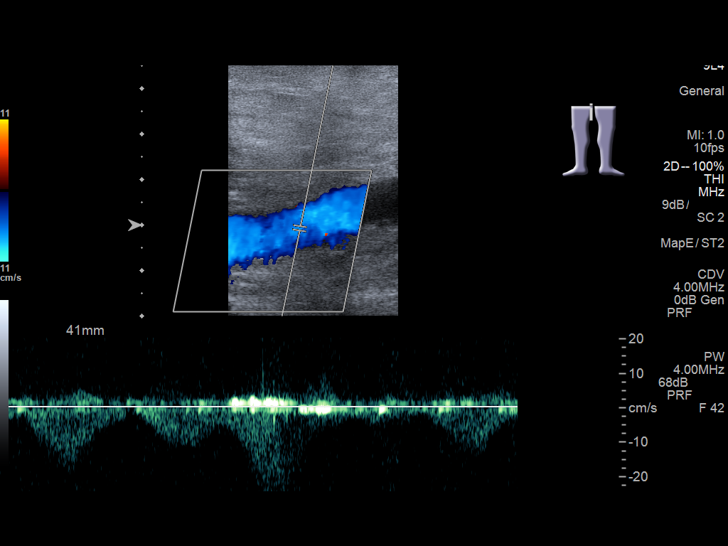
[im 10/37]
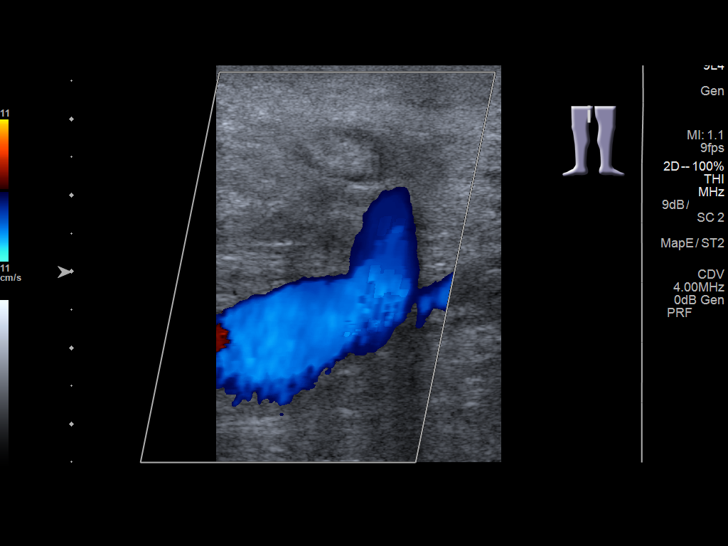
[im 13/37]
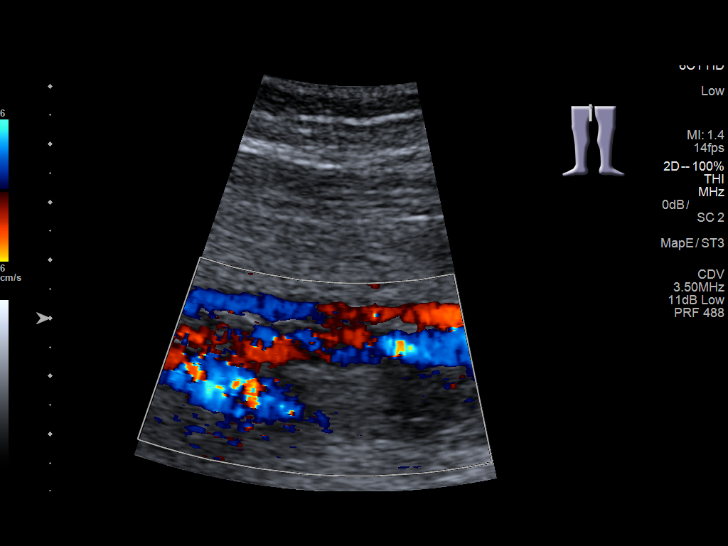
[im 16/37]
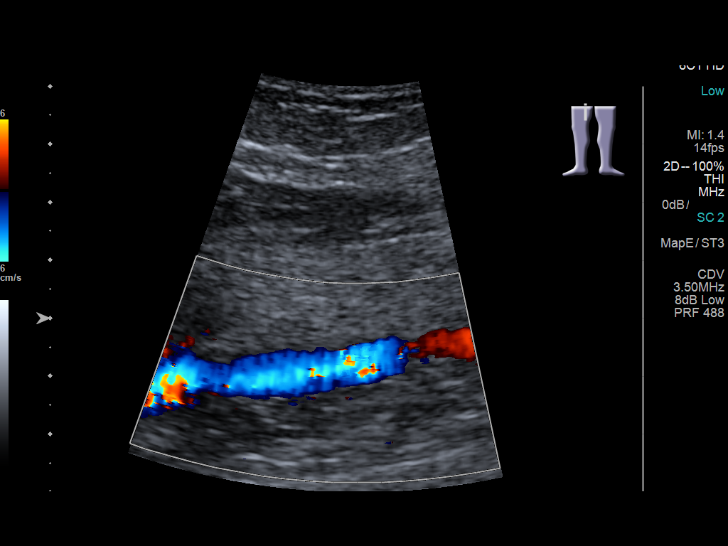
[im 19/37]
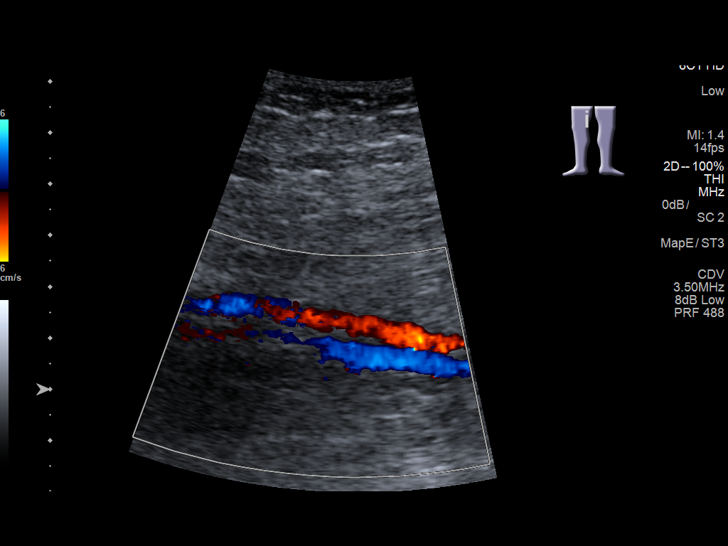
[im 21/37]
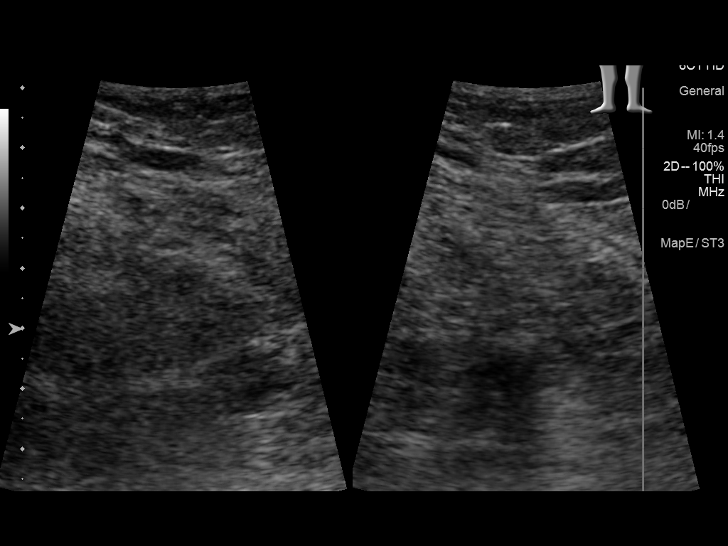
[im 24/37]
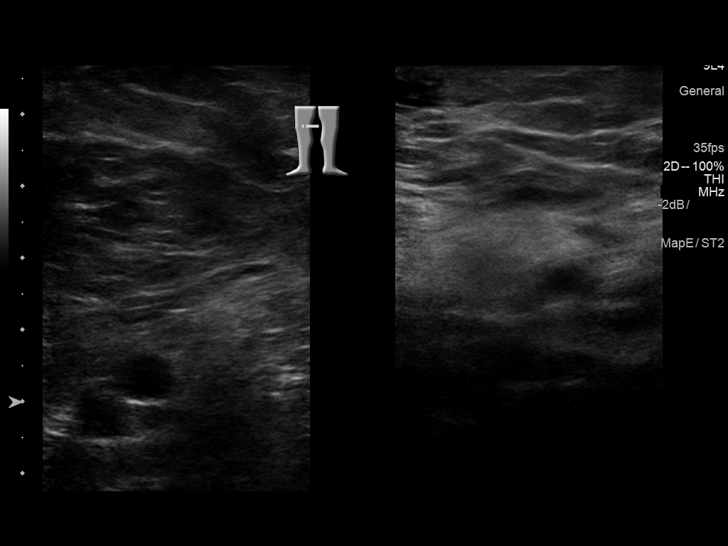
[im 27/37]
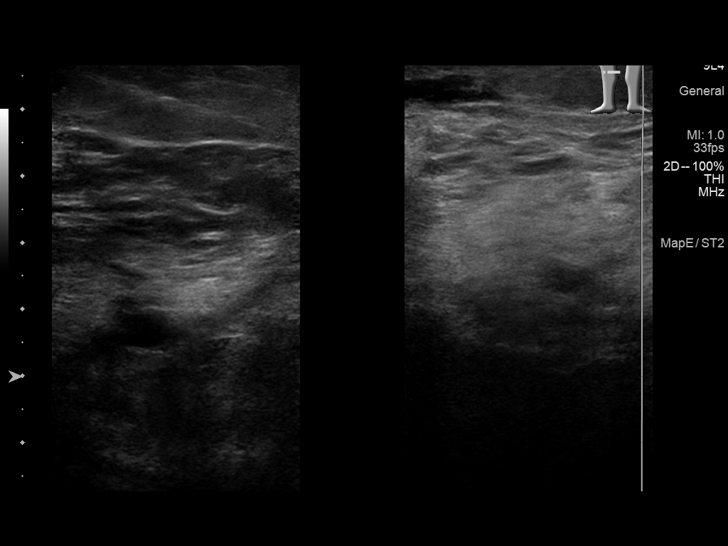
[im 30/37]
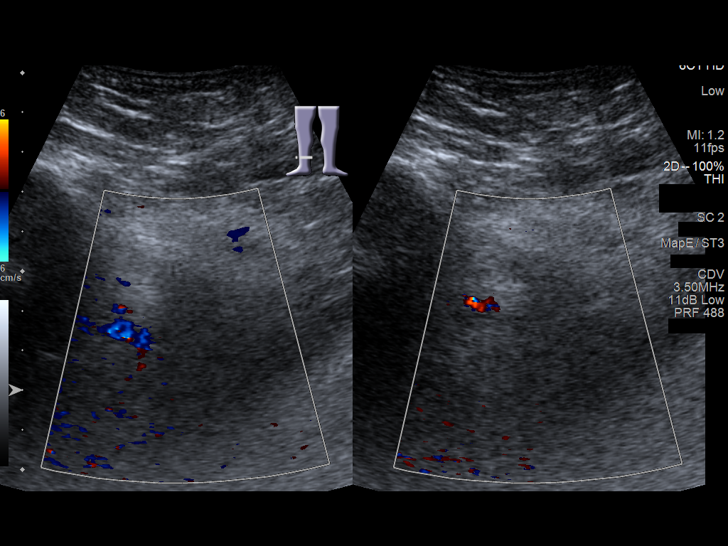
[im 33/37]
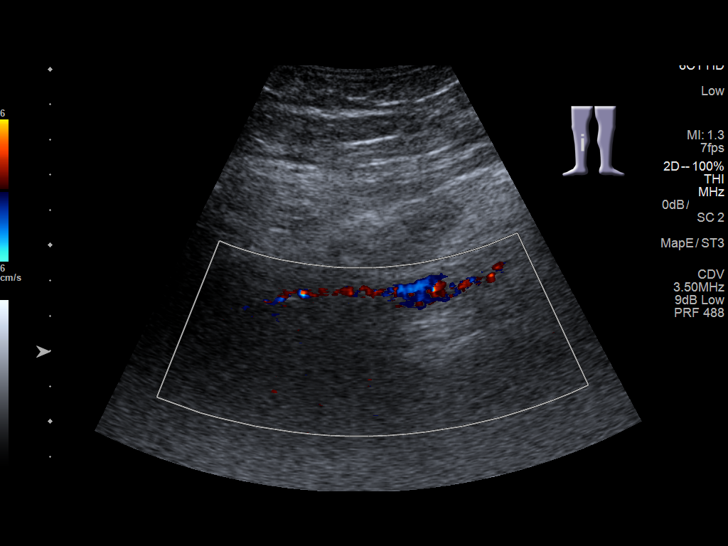
[im 37/37]
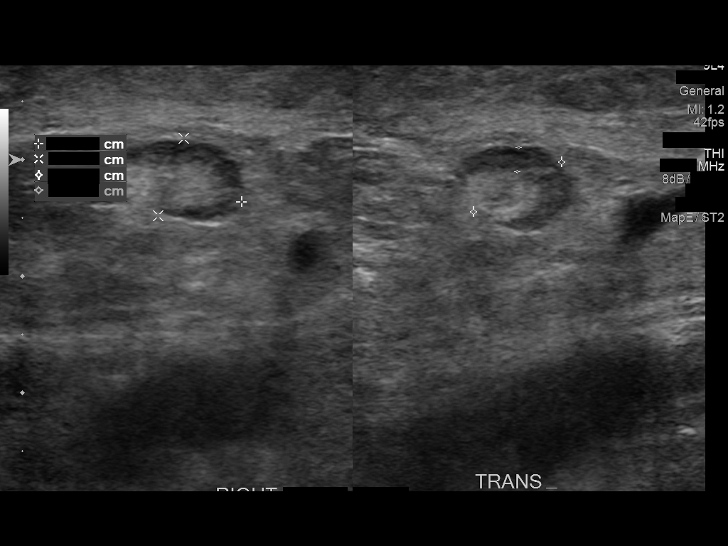

[13 of 24 positions shown; findings below may reference images not displayed]

FINDINGS: Contralateral Common Femoral Vein: Respiratory phasicity is normal
and symmetric with the symptomatic side. No evidence of thrombus.
Normal compressibility.

Common Femoral Vein: No evidence of thrombus. Normal
compressibility, respiratory phasicity and response to augmentation.

Saphenofemoral Junction: No evidence of thrombus. Normal
compressibility and flow on color Doppler imaging.

Profunda Femoral Vein: No evidence of thrombus. Normal
compressibility and flow on color Doppler imaging.

Femoral Vein: No evidence of thrombus. Normal compressibility,
respiratory phasicity and response to augmentation.

Popliteal Vein: No evidence of thrombus. Normal compressibility,
respiratory phasicity and response to augmentation.

Calf Veins: No evidence of thrombus. Normal compressibility and flow
on color Doppler imaging. Peroneal vein not visualized.

Superficial Great Saphenous Vein: No evidence of thrombus. Normal
compressibility and flow on color Doppler imaging.

Venous Reflux:  None.

Other Findings:  None.
IMPRESSION: No evidence of DVT within the right lower extremity.
